# Patient Record
Sex: Male | Born: 1970 | Race: Black or African American | Hispanic: No | Marital: Married | Smoking: Former smoker
Health system: Southern US, Community
[De-identification: ages and names within clinical notes are randomized; demographics above are authoritative.]

## PROBLEM LIST (undated history)

## (undated) DIAGNOSIS — R7611 Nonspecific reaction to tuberculin skin test without active tuberculosis: Secondary | ICD-10-CM

## (undated) DIAGNOSIS — J449 Chronic obstructive pulmonary disease, unspecified: Secondary | ICD-10-CM

## (undated) DIAGNOSIS — J439 Emphysema, unspecified: Secondary | ICD-10-CM

## (undated) HISTORY — DX: Nonspecific reaction to tuberculin skin test without active tuberculosis: R76.11

## (undated) HISTORY — PX: APPENDECTOMY: SHX54

---

## 1998-03-04 DIAGNOSIS — R7611 Nonspecific reaction to tuberculin skin test without active tuberculosis: Secondary | ICD-10-CM

## 1998-03-04 HISTORY — DX: Nonspecific reaction to tuberculin skin test without active tuberculosis: R76.11

## 2004-04-16 ENCOUNTER — Emergency Department: Payer: Self-pay | Admitting: Unknown Physician Specialty

## 2011-09-20 ENCOUNTER — Emergency Department: Payer: Self-pay | Admitting: Emergency Medicine

## 2013-09-23 ENCOUNTER — Inpatient Hospital Stay: Payer: Self-pay | Admitting: Internal Medicine

## 2013-09-23 LAB — BASIC METABOLIC PANEL
Anion Gap: 6 — ABNORMAL LOW (ref 7–16)
BUN: 14 mg/dL (ref 7–18)
Calcium, Total: 9.1 mg/dL (ref 8.5–10.1)
Chloride: 98 mmol/L (ref 98–107)
Co2: 33 mmol/L — ABNORMAL HIGH (ref 21–32)
Creatinine: 0.82 mg/dL (ref 0.60–1.30)
EGFR (Non-African Amer.): >60
GLUCOSE: 103 mg/dL — AB (ref 65–99)
Osmolality: 275 (ref 275–301)
Potassium: 4.3 mmol/L (ref 3.5–5.1)
Sodium: 137 mmol/L (ref 136–145)

## 2013-09-23 LAB — TROPONIN I

## 2013-09-23 LAB — CBC
HCT: 47.6 % (ref 40.0–52.0)
HGB: 15.5 g/dL (ref 13.0–18.0)
MCH: 32.4 pg (ref 26.0–34.0)
MCHC: 32.6 g/dL (ref 32.0–36.0)
MCV: 100 fL (ref 80–100)
PLATELETS: 104 10*3/uL — AB (ref 150–440)
RBC: 4.78 10*6/uL (ref 4.40–5.90)
RDW: 13.1 % (ref 11.5–14.5)
WBC: 12.3 10*3/uL — AB (ref 3.8–10.6)

## 2013-09-24 LAB — CBC WITH DIFFERENTIAL/PLATELET
BASOS ABS: 0 10*3/uL (ref 0.0–0.1)
BASOS PCT: 0.2 %
Eosinophil #: 0 10*3/uL (ref 0.0–0.7)
Eosinophil %: 0 %
HCT: 45.6 % (ref 40.0–52.0)
HGB: 15.1 g/dL (ref 13.0–18.0)
LYMPHS ABS: 1 10*3/uL (ref 1.0–3.6)
Lymphocyte %: 13.3 %
MCH: 32.7 pg (ref 26.0–34.0)
MCHC: 33.1 g/dL (ref 32.0–36.0)
MCV: 99 fL (ref 80–100)
MONO ABS: 0.2 x10 3/mm (ref 0.2–1.0)
Monocyte %: 2.8 %
Neutrophil #: 6 10*3/uL (ref 1.4–6.5)
Neutrophil %: 83.7 %
Platelet: 87 10*3/uL — ABNORMAL LOW (ref 150–440)
RBC: 4.62 10*6/uL (ref 4.40–5.90)
RDW: 13 % (ref 11.5–14.5)
WBC: 7.2 10*3/uL (ref 3.8–10.6)

## 2013-09-24 LAB — BASIC METABOLIC PANEL
Anion Gap: 6 — ABNORMAL LOW (ref 7–16)
BUN: 16 mg/dL (ref 7–18)
CALCIUM: 8.9 mg/dL (ref 8.5–10.1)
CREATININE: 0.95 mg/dL (ref 0.60–1.30)
Chloride: 97 mmol/L — ABNORMAL LOW (ref 98–107)
Co2: 35 mmol/L — ABNORMAL HIGH (ref 21–32)
EGFR (Non-African Amer.): >60
GLUCOSE: 139 mg/dL — AB (ref 65–99)
OSMOLALITY: 279 (ref 275–301)
POTASSIUM: 4.6 mmol/L (ref 3.5–5.1)
Sodium: 138 mmol/L (ref 136–145)

## 2013-09-24 LAB — RAPID HIV SCREEN (HIV 1/2 AB+AG)

## 2013-11-12 ENCOUNTER — Emergency Department: Payer: Self-pay | Admitting: Emergency Medicine

## 2013-12-28 ENCOUNTER — Ambulatory Visit: Payer: Self-pay

## 2014-01-04 ENCOUNTER — Ambulatory Visit: Payer: Self-pay

## 2014-06-25 NOTE — Discharge Summary (Signed)
PATIENT NAME:  Chris Case, Chris Case MR#:  [id number] DATE OF BIRTH:  1970-05-27  DATE OF ADMISSION:  09/23/2013 DATE OF DISCHARGE:  09/27/2013  PRIMARY CARE PHYSICIAN: At the [facility].   FINAL DIAGNOSES: 1. Acute now chronic hypoxic respiratory failure.  2. Chronic obstructive pulmonary disease exacerbation.  3. Thrombocytopenia.  4. Tobacco abuse.   MEDICATIONS ON DISCHARGE: Include: A prednisone taper: 5 mg 5 tablets day one; 4 tablets day two, 3 tablets day three, 2 tablets day four, 1 tablet day five, then stop. Spiriva 1 inhalation daily, Advair Diskus 250/50 at 1 puff twice a day, albuterol CFC 1 puff 4 times a day as needed for shortness of breath, nicotine patch 1 patch daily, Levaquin 500 mg orally every 24 hours for 4 more days.   DIET: Regular diet, regular consistency.   ACTIVITY: As tolerated.   FOLLOWUP: In 1 to 2 weeks at the [facility].   HOSPITAL COURSE: The patient was admitted 09/23/2013, discharged 09/27/2013, was admitted with shortness of breath, hypoxic/hypercarbic respiratory failure secondary to severe COPD, new diagnosis, was started on IV steroids, IV Levaquin, started on Advair and Spiriva. Pulmonary was asked to see the patient. The patient was seen in consultation by Dr. Belia HemanKasa, pulmonary.   LABORATORY AND RADIOLOGICAL DATA DURING THE HOSPITAL COURSE: Included: An EKG that showed sinus tachycardia, occasional premature ventricular complexes. Glucose 103, BUN 14, creatinine 0.82, sodium 137, potassium 4.3, chloride 98, CO2 of 33, calcium 9.1. Troponin negative. White blood cell count 12.3, hemoglobin and hematocrit 15.5 and 47.6, platelet count of 104,000. Chest x-ray: Advanced COPD and emphysema. No acute cardiopulmonary disease. ABG showed a pH of 7.34, pCO2 of 65, pO2 of 68, FiO2 of 28%, bicarbonate 35.1, oxygen saturation 92%. CT scan of the chest showed no pulmonary embolism, marked hyperinflation consistent with COPD, alpha antitrypsin phenotype 163, which  is in the normal range. HIV negative. Hepatitis C negative. Platelet count upon discharge 8700.   HOSPITAL COURSE PER PROBLEM LIST:  1. Acute now chronic hypoxic respiratory failure. The patient was started on IV steroids, IV Levaquin. Respiratory status had improved, but we were unable to get him off oxygen and pulse oximetry 87% on room air and lungs were moving good air without any wheeze upon discharge. I think this is chronic hypoxic respiratory failure secondary to 3 packs per day of smoking. The patient will go home on a prednisone taper. He will be set up with the [facility] program for Spiriva, Advair and albuterol inhaler.  2. COPD exacerbation. It was treated with IV Solu-Medrol and nebulizer treatments in the hospital and Levaquin. He will finish up the Levaquin course upon discharge. Inhalers were labeled for discharge. He will [facility]ers.  3. Thrombocytopenia, unclear etiology. [facility] for [facility]. HIV and hepatitis C were negative.  4. Tobacco abuse. Smoking cessation counseling done during the hospital course.    TIME SPENT ON DISCHARGE: 35 minutes.    ____________________________ Herschell Dimesichard J. Renae GlossWieting, MD rjw:lm D: 09/27/2013 14:11:07 ET T: 09/28/2013 01:45:16 ET JOB#: [id number]  cc: Herschell Dimesichard J. Renae GlossWieting, MD, <Dictator> [facility] Salley ScarletICHARD J Camelia Stelzner MD ELECTRONICALLY SIGNED 09/30/2013 15:44

## 2014-06-25 NOTE — H&P (Signed)
PATIENT NAME:  Chris Case, Chris Case MR#:  [id number] DATE OF BIRTH:  April 22, 1970  DATE OF ADMISSION:  09/23/2013  PRIMARY CARE PROVIDER: None.   EMERGENCY DEPARTMENT REFERRING PHYSICIAN: Dr. Ethelda Chick.    CHIEF COMPLAINT: Shortness of breath.   HISTORY OF PRESENT ILLNESS: The patient is a 44 year old African American male with history of progressive shortness of breath for 4 years. Has not sought any medical attention. Now reports over the past week symptoms have gotten very severe, and he has been short of breath even at rest. He also has some productive cough. The patient came to the ED with these symptoms. He does smoke 3 packs per day for the last 18 years, and he underwent evaluation with CT per PE protocol, which showed marked hyperinflation consistent with COPD.  No CHF or lymphadenopathy was noted. The patient had an ABG that was suggestive of hypercarbia and hypoxia. The patient otherwise denies any fevers, chills. No chest pains, palpitations. No nausea, vomiting or diarrhea. No fevers.   PAST MEDICAL HISTORY: None.   PAST SURGICAL HISTORY: Appendectomy.   ALLERGIES: None.   MEDICATIONS: None.   SOCIAL HISTORY: Smokes 3 packs per day for 18 years. No alcohol. Uses marijuana from time to time.   FAMILY HISTORY: Mother with COPD, died at age 52. Grandfather with COPD.     REVIEW OF SYSTEMS:   CONSTITUTIONAL: Denies any fevers. Complains of fatigue, weakness. No pain. No weight loss. No weight gain.  EYES: No blurred or double vision. No pain. No redness. No inflammation. No glaucoma. No cataracts.  ENT: No tinnitus. No ear pain. No hearing loss. No seasonal or year-round allergies. No epistaxis. No difficulty swallowing.  RESPIRATORY: Complains of cough, shortness of breath, wheezing. No hemoptysis.  CARDIOVASCULAR: Denies any chest pain, orthopnea, edema or arrhythmia.  GASTROINTESTINAL: No nausea, vomiting, diarrhea. No abdominal pain. No hematemesis. No melena.  GENITOURINARY: Denies  any dysuria, hematuria, renal colic or frequency.  ENDOCRINE: Denies any polyuria, nocturia or thyroid problems.  HEMATOLOGIC AND LYMPHATIC: Denies anemia, easy bruisability or bleeding.  SKIN: No acne. No rash. No changes in mole, hair or skin.  MUSCULOSKELETAL: Denies any pain in the neck, back or shoulder.  NEUROLOGIC:  No numbness, CVA, TIA or seizure.  PSYCHIATRIC: No anxiety, insomnia or ADD.   PHYSICAL EXAMINATION: VITAL SIGNS:  Temperature 98, pulse 106, respirations 22, blood pressure 152/96, O2 of 78 with room air.  GENERAL: The patient is a thin male in mild respiratory distress with accessory muscle usage.  HEENT: Head atraumatic, normocephalic. Pupils equally round, reactive to light and accommodation. There is no conjunctival pallor. No scleral icterus. Nasal exam shows no drainage or ulceration.  Oropharynx is clear without any exudate.  NECK: Supple without any JVD.  CARDIOVASCULAR: Regular rate and rhythm.  No murmurs, rubs, clicks or gallops.    LUNGS: Diminished breath sounds bilaterally without any rales, rhonchi, wheezing. There is accessory muscle usage.  ABDOMEN: Soft, nontender, nondistended. Positive bowel sounds x 4.  EXTREMITIES: No clubbing, cyanosis or edema.  SKIN: No rash.  LYMPHATICS: No lymph nodes palpable.  VASCULAR: Good DP, PT pulses.  PSYCHIATRIC: Not anxious or depressed.  NEUROLOGIC: Awake, alert, oriented x 3. No focal deficits.  LYMPHATICS:  Lymph nodes nonpalpable.   LABORATORY DATA AND IMAGING:  CT per PE protocol; shows no evidence of PE. There is marked hyperinflation consistent with COPD.  No CHF or pneumonia. Glucose 103, BUN 14, creatinine 0.82, sodium 137, potassium 4.3, chloride 98, calcium 9.1. Troponin less  than 0.02. WBC 12.3, hemoglobin 15.5, platelet count 104. ABG:  PH 7.34, pCO2 of 65, pO2 of 68.   ASSESSMENT AND PLAN:  The patient is a 44 year old African American male with history of 3 packs per day of smoking for 20 years,  presents with shortness of breath, cough. No diagnosis of COPD.  CT is suggestive of severe COPD. ABG shows hypoxia and hypercarbia.  1. Hypoxic hypercarbic failure, likely due to severe chronic obstructive pulmonary disease, which is a new diagnosis for this patient. At this time, we will treat him with nebulizers, steroids  and IV Levaquin. I will also start him on Advair and Spiriva. We will ask pulmonary to see, especially with his age, [age], and family history.  I will check alpha-antitrypsin panel.  2. Nicotine addiction. The patient counseled regarding importance of smoking cessation. Nicotine patch will be started, and the patient strongly recommended to stop smoking. Four minutes spent on this.   TIME SPENT:  50 minutes.      ____________________________ Lacie ScottsShreyang H. Allena KatzPatel, MD shp:dmm D: 09/23/2013 19:33:10 ET T: 09/23/2013 19:47:57 ET JOB#: [id number]  cc: Carrell Palmatier H. Allena KatzPatel, MD, <Dictator> Charise CarwinSHREYANG H Antuane Eastridge MD ELECTRONICALLY SIGNED 10/02/2013 11:07

## 2015-08-23 ENCOUNTER — Ambulatory Visit
Admission: RE | Admit: 2015-08-23 | Discharge: 2015-08-23 | Disposition: A | Discharge status: Home / Self Care | Payer: Medicaid Other | Source: Ambulatory Visit | Attending: Family Medicine | Admitting: Family Medicine

## 2015-08-23 ENCOUNTER — Other Ambulatory Visit: Payer: Self-pay | Admitting: Family Medicine

## 2015-08-23 DIAGNOSIS — J441 Chronic obstructive pulmonary disease with (acute) exacerbation: Secondary | ICD-10-CM

## 2015-09-23 ENCOUNTER — Emergency Department: Payer: Medicaid Other

## 2015-09-23 ENCOUNTER — Observation Stay
Admission: EM | Admit: 2015-09-23 | Discharge: 2015-09-26 | Disposition: A | Discharge status: Home / Self Care | Payer: Medicaid Other | Attending: Internal Medicine | Admitting: Internal Medicine

## 2015-09-23 ENCOUNTER — Encounter: Payer: Self-pay | Admitting: Emergency Medicine

## 2015-09-23 DIAGNOSIS — J441 Chronic obstructive pulmonary disease with (acute) exacerbation: Secondary | ICD-10-CM

## 2015-09-23 DIAGNOSIS — R600 Localized edema: Secondary | ICD-10-CM

## 2015-09-23 DIAGNOSIS — D696 Thrombocytopenia, unspecified: Secondary | ICD-10-CM | POA: Insufficient documentation

## 2015-09-23 DIAGNOSIS — R0902 Hypoxemia: Secondary | ICD-10-CM | POA: Diagnosis present

## 2015-09-23 DIAGNOSIS — Z590 Homelessness: Secondary | ICD-10-CM | POA: Insufficient documentation

## 2015-09-23 DIAGNOSIS — R9431 Abnormal electrocardiogram [ECG] [EKG]: Secondary | ICD-10-CM

## 2015-09-23 DIAGNOSIS — Z79899 Other long term (current) drug therapy: Secondary | ICD-10-CM | POA: Diagnosis not present

## 2015-09-23 DIAGNOSIS — J209 Acute bronchitis, unspecified: Secondary | ICD-10-CM

## 2015-09-23 DIAGNOSIS — J9621 Acute and chronic respiratory failure with hypoxia: Secondary | ICD-10-CM | POA: Diagnosis not present

## 2015-09-23 DIAGNOSIS — J9622 Acute and chronic respiratory failure with hypercapnia: Secondary | ICD-10-CM | POA: Diagnosis not present

## 2015-09-23 DIAGNOSIS — F17200 Nicotine dependence, unspecified, uncomplicated: Secondary | ICD-10-CM | POA: Insufficient documentation

## 2015-09-23 DIAGNOSIS — J440 Chronic obstructive pulmonary disease with acute lower respiratory infection: Secondary | ICD-10-CM | POA: Diagnosis not present

## 2015-09-23 HISTORY — DX: Emphysema, unspecified: J43.9

## 2015-09-23 HISTORY — DX: Chronic obstructive pulmonary disease, unspecified: J44.9

## 2015-09-23 LAB — BASIC METABOLIC PANEL
ANION GAP: 5 (ref 5–15)
BUN: 16 mg/dL (ref 6–20)
CALCIUM: 8.6 mg/dL — AB (ref 8.9–10.3)
CO2: 38 mmol/L — AB (ref 22–32)
CREATININE: 0.87 mg/dL (ref 0.61–1.24)
Chloride: 98 mmol/L — ABNORMAL LOW (ref 101–111)
GLUCOSE: 87 mg/dL (ref 65–99)
Potassium: 4 mmol/L (ref 3.5–5.1)
Sodium: 141 mmol/L (ref 135–145)

## 2015-09-23 LAB — TROPONIN I

## 2015-09-23 LAB — CBC
HCT: 45.9 % (ref 40.0–52.0)
HEMOGLOBIN: 15 g/dL (ref 13.0–18.0)
MCH: 32.3 pg (ref 26.0–34.0)
MCHC: 32.7 g/dL (ref 32.0–36.0)
MCV: 98.8 fL (ref 80.0–100.0)
PLATELETS: 95 10*3/uL — AB (ref 150–440)
RBC: 4.65 MIL/uL (ref 4.40–5.90)
RDW: 14.7 % — ABNORMAL HIGH (ref 11.5–14.5)
WBC: 8.4 10*3/uL (ref 3.8–10.6)

## 2015-09-23 LAB — BRAIN NATRIURETIC PEPTIDE: B NATRIURETIC PEPTIDE 5: 191 pg/mL — AB (ref 0.0–100.0)

## 2015-09-23 MED ORDER — PREDNISONE 20 MG PO TABS
60.0000 mg | ORAL_TABLET | Freq: Once | ORAL | Status: AC
  Administered 2015-09-24: 60 mg via ORAL
  Filled 2015-09-23: qty 3

## 2015-09-23 MED ORDER — IPRATROPIUM-ALBUTEROL 0.5-2.5 (3) MG/3ML IN SOLN
3.0000 mL | Freq: Once | RESPIRATORY_TRACT | Status: AC
  Administered 2015-09-24: 3 mL via RESPIRATORY_TRACT
  Filled 2015-09-23: qty 3

## 2015-09-23 NOTE — ED Notes (Signed)
Pt states that he is supposed to be on O2 at [facility], but that he currently lives in his truck and that it's too hot to have them in the truck.  Pt reports that he does not have a history of CHF, but that he believes that his grandma had it because her feet also used to swell.  Pt has no family in the area to stay with so that he can have his O2 at this time.  Pt was 83-85% on room air, pt placed on 2LNC and O2 sat improved.  Pt also dyspneic with exertion from chair to bed.

## 2015-09-23 NOTE — ED Notes (Addendum)
Patient states that he started developing swelling to bilateral feet about 2 days ago.  Patient denies any history of chf. Patient with history of copd and denies any increase in shortness of breath. Patient reports that he is suppose to wear oxygen at 2l as needed.

## 2015-09-24 ENCOUNTER — Encounter: Payer: Self-pay | Admitting: Internal Medicine

## 2015-09-24 ENCOUNTER — Observation Stay (HOSPITAL_BASED_OUTPATIENT_CLINIC_OR_DEPARTMENT_OTHER)
Admit: 2015-09-24 | Discharge: 2015-09-24 | Disposition: A | Discharge status: Home / Self Care | Payer: Medicaid Other | Attending: Internal Medicine | Admitting: Internal Medicine

## 2015-09-24 DIAGNOSIS — R9431 Abnormal electrocardiogram [ECG] [EKG]: Secondary | ICD-10-CM

## 2015-09-24 DIAGNOSIS — R600 Localized edema: Secondary | ICD-10-CM

## 2015-09-24 DIAGNOSIS — J9621 Acute and chronic respiratory failure with hypoxia: Secondary | ICD-10-CM | POA: Insufficient documentation

## 2015-09-24 DIAGNOSIS — J441 Chronic obstructive pulmonary disease with (acute) exacerbation: Secondary | ICD-10-CM

## 2015-09-24 DIAGNOSIS — J9622 Acute and chronic respiratory failure with hypercapnia: Secondary | ICD-10-CM

## 2015-09-24 MED ORDER — ACETAMINOPHEN 650 MG RE SUPP: 650.0000 mg | Freq: Four times a day (QID) | RECTAL | Status: DC | PRN

## 2015-09-24 MED ORDER — BISACODYL 10 MG RE SUPP: 10.0000 mg | Freq: Every day | RECTAL | Status: DC | PRN

## 2015-09-24 MED ORDER — ONDANSETRON HCL 4 MG PO TABS: 4.0000 mg | ORAL_TABLET | Freq: Four times a day (QID) | ORAL | Status: DC | PRN

## 2015-09-24 MED ORDER — SODIUM CHLORIDE 0.9% FLUSH: 3.0000 mL | Freq: Two times a day (BID) | INTRAVENOUS | Status: DC

## 2015-09-24 MED ORDER — ONDANSETRON HCL 4 MG/2ML IJ SOLN
4.0000 mg | Freq: Four times a day (QID) | INTRAMUSCULAR | Status: DC | PRN
Start: 2015-09-24 — End: 2015-09-26

## 2015-09-24 MED ORDER — MOMETASONE FURO-FORMOTEROL FUM 200-5 MCG/ACT IN AERO
2.0000 | INHALATION_SPRAY | Freq: Two times a day (BID) | RESPIRATORY_TRACT | Status: DC
  Administered 2015-09-24 – 2015-09-26 (×5): 2 via RESPIRATORY_TRACT
  Filled 2015-09-24: qty 8.8

## 2015-09-24 MED ORDER — TIOTROPIUM BROMIDE MONOHYDRATE 18 MCG IN CAPS
18.0000 ug | ORAL_CAPSULE | Freq: Every day | RESPIRATORY_TRACT | Status: DC
  Administered 2015-09-24 – 2015-09-26 (×3): 18 ug via RESPIRATORY_TRACT
  Filled 2015-09-24: qty 5

## 2015-09-24 MED ORDER — SODIUM CHLORIDE 0.9% FLUSH
3.0000 mL | Freq: Two times a day (BID) | INTRAVENOUS | Status: DC
  Administered 2015-09-24: 3 mL via INTRAVENOUS

## 2015-09-24 MED ORDER — LEVOFLOXACIN 500 MG PO TABS: 500.0000 mg | ORAL_TABLET | Freq: Every day | ORAL | Status: DC

## 2015-09-24 MED ORDER — SODIUM CHLORIDE 0.9% FLUSH: 3.0000 mL | INTRAVENOUS | Status: DC | PRN

## 2015-09-24 MED ORDER — IPRATROPIUM-ALBUTEROL 0.5-2.5 (3) MG/3ML IN SOLN
3.0000 mL | Freq: Four times a day (QID) | RESPIRATORY_TRACT | Status: DC
  Administered 2015-09-24 – 2015-09-25 (×7): 3 mL via RESPIRATORY_TRACT
  Filled 2015-09-24 (×7): qty 3

## 2015-09-24 MED ORDER — LEVOFLOXACIN 500 MG PO TABS
500.0000 mg | ORAL_TABLET | Freq: Every day | ORAL | Status: DC
  Administered 2015-09-24 – 2015-09-25 (×2): 500 mg via ORAL
  Filled 2015-09-24 (×2): qty 1

## 2015-09-24 MED ORDER — HEPARIN SODIUM (PORCINE) 5000 UNIT/ML IJ SOLN
5000.0000 [IU] | Freq: Three times a day (TID) | INTRAMUSCULAR | Status: DC
  Administered 2015-09-24 – 2015-09-26 (×6): 5000 [IU] via SUBCUTANEOUS
  Filled 2015-09-24 (×6): qty 1

## 2015-09-24 MED ORDER — PANTOPRAZOLE SODIUM 40 MG PO TBEC
40.0000 mg | DELAYED_RELEASE_TABLET | Freq: Every day | ORAL | Status: DC
  Administered 2015-09-24 – 2015-09-26 (×3): 40 mg via ORAL
  Filled 2015-09-24 (×3): qty 1

## 2015-09-24 MED ORDER — HYDROCODONE-ACETAMINOPHEN 5-325 MG PO TABS: 1.0000 | ORAL_TABLET | ORAL | Status: DC | PRN

## 2015-09-24 MED ORDER — ALBUTEROL SULFATE (2.5 MG/3ML) 0.083% IN NEBU: 3.0000 mL | INHALATION_SOLUTION | RESPIRATORY_TRACT | Status: DC | PRN

## 2015-09-24 MED ORDER — ACETAMINOPHEN 325 MG PO TABS
650.0000 mg | ORAL_TABLET | Freq: Four times a day (QID) | ORAL | Status: DC | PRN
  Administered 2015-09-25: 650 mg via ORAL
  Filled 2015-09-24: qty 2

## 2015-09-24 MED ORDER — DOCUSATE SODIUM 100 MG PO CAPS
100.0000 mg | ORAL_CAPSULE | Freq: Two times a day (BID) | ORAL | Status: DC
  Administered 2015-09-24 – 2015-09-25 (×2): 100 mg via ORAL
  Filled 2015-09-24 (×3): qty 1

## 2015-09-24 MED ORDER — SODIUM CHLORIDE 0.9 % IV SOLN: 250.0000 mL | INTRAVENOUS | Status: DC | PRN

## 2015-09-24 MED ORDER — ASPIRIN EC 81 MG PO TBEC
81.0000 mg | DELAYED_RELEASE_TABLET | Freq: Every day | ORAL | Status: DC
Start: 2015-09-24 — End: 2015-09-26
  Administered 2015-09-24 – 2015-09-26 (×3): 81 mg via ORAL
  Filled 2015-09-24 (×3): qty 1

## 2015-09-24 NOTE — ED Provider Notes (Signed)
[facility] Emergency Department Provider Note  Time seen: 12:01 AM  I have reviewed the triage vital signs and the nursing notes.   HISTORY  Chief Complaint Foot Swelling    HPI Chris Case is a 45 y.o. male with a past medical history of COPD who presents to the emergency department with shortness of breath and swelling in his feet. According to the patient he is prescribed oxygen, but is currently homeless so has nowhere to put the oxygen or keep the oxygen tanks.Patient presents the emergency department wishing to be evaluated for bilateral foot swelling and increased shortness of breath. On arrival the patient is an 83% room air oxygen saturation. Denies any cough or fever. Denies any recent chest pain. Denies abdominal pain. Describes his shortness of breath is moderate, but states this is how it has felt. Patient saw his primary care doctor is [place] [facility] 1 week ago, and was restarted on his Advair and albuterol. He was told he needs oxygen but he needs to have somewhere to keep it, before they would prescribe it, and told him if his shortness of breath worsens to go to the emergency department.     Past Medical History  Diagnosis Date  . COPD (chronic obstructive pulmonary disease) (HCC)   . Emphysema lung (HCC)     There are no active problems to display for this patient.   Past Surgical History  Procedure Laterality Date  . Appendectomy      No current outpatient prescriptions on file.  Allergies Review of patient's allergies indicates no known allergies.  No family history on file.  Social History Social History  Substance Use Topics  . Smoking status: Current Every Day Smoker  . Smokeless tobacco: None  . Alcohol Use: None    Review of Systems Constitutional: Negative for fever. Cardiovascular: Negative for chest pain. Respiratory: Positive for shortness of breath, chronic Gastrointestinal: Negative for abdominal  pain Musculoskeletal: Negative for back pain. Neurological: Negative for headache 10-point ROS otherwise negative.  ____________________________________________   PHYSICAL EXAM:  VITAL SIGNS: ED Triage Vitals  Enc Vitals Group     BP 09/23/15 1955 114/77 mmHg     Pulse Rate 09/23/15 1955 87     Resp 09/23/15 1955 18     Temp 09/23/15 1955 98.4 F (36.9 C)     Temp src --      SpO2 09/23/15 1955 91 %     Weight 09/23/15 1955 130 lb (58.968 kg)     Height 09/23/15 1955 5\' 6"  (1.676 m)     Head Cir --      Peak Flow --      Pain Score --      Pain Loc --      Pain Edu? --      Excl. in GC? --     Constitutional: Alert and oriented. Well appearing and in no distress. Eyes: Normal exam ENT   Head: Normocephalic and atraumatic   Mouth/Throat: Mucous membranes are moist. Cardiovascular: Normal rate, regular rhythm. No murmur Respiratory: Normal respiratory effort without tachypnea nor retractions. Breath sounds are diminished bilaterally, but no active wheezes appreciated. Gastrointestinal: Soft and nontender. No distention.  Musculoskeletal: Nontender with normal range of motion in all extremities. 1+ edema to lower extremities bilaterally, no calf tenderness. Neurologic:  Normal speech and language. No gross focal neurologic deficits  Skin:  Skin is warm, dry and intact.  Psychiatric: Mood and affect are normal.  ____________________________________________  EKG  EKG reviewed and interpreted by myself shows normal sinus rhythm at 86 bpm, narrow QRS, normal axis, normal intervals, nonspecific ST changes. No ST elevations.  ____________________________________________    RADIOLOGY  Chest x-ray negative  ____________________________________________    INITIAL IMPRESSION / ASSESSMENT AND PLAN / ED COURSE  Pertinent labs & imaging results that were available during my care of the patient were reviewed by me and considered in my medical decision making (see  chart for details).  The patient presents the emergency department hypoxic, with decreased breath sounds bilaterally. Patient is supposed to be on home oxygen, but does not have a home currently so cannot obtain home oxygen. As the patient is 83% room air oxygen saturation we cannot discharge the patient home at this time. We will dose prednisone, DuoNebs, and closely monitor in the emergency department. Patient will likely require social work consultation to decide upon appropriate disposition versus hospitalist admission for hypoxia. Patient will require an echocardiogram as an outpatient. Labs including troponin are largely within normal limits size a mild BNP elevation.   Patient is medically clear at this time. He will need to follow up with cardiology in the next 1-2 weeks for an echocardiogram. I discussed this with the patient who is agreeable to this plan. Currently awaiting social work consultation, in hopes of helping the patient obtain home oxygen without requiring hospital admission.  ____________________________________________   FINAL CLINICAL IMPRESSION(S) / ED DIAGNOSES  Hypoxia COPD Peripheral edema   Minna Antis, MD 09/24/15 [phone]

## 2015-09-24 NOTE — ED Notes (Signed)
Report given to next shift RN Jordan L. 

## 2015-09-24 NOTE — H&P (Signed)
History and Physical    Hebert Dooling WJX:[id number] DOB: 12/09/1970 DOA: 09/23/2015  Referring physician: Dr. Alphonzo Lemmings PCP: [facility]  Specialists: none  Chief Complaint:  SOB and swelling  HPI: Chris Case is a 45 y.o. male has a past medical history significant for COPD now with worsening SOB and LE edema. No fever. Denies CP or cardiac hx. Pt is homeless. Was on O2 in the past. In ER, pt hypoxic but CXR negative. EKG is abnormal. He is now admitted.  Review of Systems: The patient denies anorexia, fever, weight loss,, vision loss, decreased hearing, hoarseness, chest pain, syncope,  balance deficits, hemoptysis, abdominal pain, melena, hematochezia, severe indigestion/heartburn, hematuria, incontinence, genital sores, muscle weakness, suspicious skin lesions, transient blindness, difficulty walking, depression, unusual weight change, abnormal bleeding, enlarged lymph nodes, angioedema, and breast masses.   Past Medical History:  Diagnosis Date  . COPD (chronic obstructive pulmonary disease) (HCC)   . Emphysema lung (HCC)    Past Surgical History:  Procedure Laterality Date  . APPENDECTOMY     Social History:  reports that he has been smoking.  He has never used smokeless tobacco. His alcohol and drug histories are not on file.  No Known Allergies  History reviewed. No pertinent family history.  Prior to Admission medications   Medication Sig Start Date End Date Taking? Authorizing Provider  albuterol (PROVENTIL HFA;VENTOLIN HFA) 108 (90 Base) MCG/ACT inhaler Inhale 1 puff into the lungs every 6 (six) hours as needed for wheezing or shortness of breath.   Yes Historical Provider, MD  OXYGEN Inhale 2 L/min into the lungs continuous as needed. For shortness of breath.   Yes Historical Provider, MD  tiotropium (SPIRIVA) 18 MCG inhalation capsule Place 18 mcg into inhaler and inhale daily.   Yes Historical Provider, MD   Physical Exam: Vitals:   09/24/15 0830  09/24/15 0900 09/24/15 1030 09/24/15 1155  BP: 98/68 105/64 102/66 109/78  Pulse: 62 79 71   Resp: Temp:      SpO2: 95% 97% 94%   Weight:      Height:         General:  No apparent distress, WDWN, [place]/AT  Eyes: PERRL, EOMI, no scleral icterus, conjunctiva clear  ENT: moist oropharynx without exudate, TM's benign, Dentition fair  Neck: supple, no lymphadenopathy  Cardiovascular: regular rate without MRG; 2+ peripheral pulses, no JVD, no peripheral edema  Respiratory: decreased breath sounds with scattered wheezes and rhonchi. No dullness or rales. Respiratory effort increased  Abdomen: soft, non tender to palpation, positive bowel sounds, no guarding, no rebound  Skin: no rashes or lesions  Musculoskeletal: normal bulk and tone, no joint swelling  Psychiatric: normal mood and affect, A&OX3  Neurologic: CN 2-12 grossly intact, Motor strength 5/5 in all 4 groups with symmetric DTR's and non-focal sensory exam.  Labs on Admission:  Basic Metabolic Panel:  Recent Labs Lab 09/23/15 2002  NA 141  K 4.0  CL 98*  CO2 38*  GLUCOSE 87  BUN 16  CREATININE 0.87  CALCIUM 8.6*   Liver Function Tests: No results for input(s): AST, ALT, ALKPHOS, BILITOT, PROT, ALBUMIN in the last 168 hours. No results for input(s): LIPASE, AMYLASE in the last 168 hours. No results for input(s): AMMONIA in the last 168 hours. CBC:  Recent Labs Lab 09/23/15 2002  WBC 8.4  HGB 15.0  HCT 45.9  MCV 98.8  PLT 95*   Cardiac Enzymes:  Recent Labs Lab 09/23/15  2002  TROPONINI <0.03    BNP (last 3 results)  Recent Labs  09/23/15 2002  BNP 191.0*    ProBNP (last 3 results) No results for input(s): PROBNP in the last 8760 hours.  CBG: No results for input(s): GLUCAP in the last 168 hours.  Radiological Exams on Admission: Dg Chest 2 View  Result Date: 09/23/2015 CLINICAL DATA:  Patient with intermittent shortness of breath. Productive cough. EXAM: CHEST  2 VIEW  COMPARISON:  Chest radiograph 08/23/2015. FINDINGS: Normal cardiac and mediastinal contours. No consolidative pulmonary opacities. No pleural effusion or pneumothorax. Thoracic spine degenerative changes. IMPRESSION: No active cardiopulmonary disease. Electronically Signed   By: Annia Belt M.D.   On: 09/23/2015 20:21    EKG: Independently reviewed.  Assessment/Plan Principal Problem:   COPD exacerbation (HCC) Active Problems:   Hypoxia   Abnormal ECG   Edema, lower extremity   Will admit to floor and begin optimal pulmonary toilet. CM consult for home health and home O2. Echo due to abnormal ECG. Repeat labs in AM.  Diet: low salt Fluids: saline lock DVT Prophylaxis: SQ Heparin  Code Status: FULL  Family Communication: none  Disposition Plan: home  Time spent: 45 min

## 2015-09-24 NOTE — Clinical Social Work Note (Signed)
Clinical Social Work Assessment  Patient Details  Name: Chris Case MRN: [id number] Date of Birth: 04-12-1970  Date of referral:  09/24/15               Reason for consult:  Discharge Planning, [employer], Housing Concerns/Homelessness                Permission sought to share information with:  Family Supports Permission granted to share information::  Yes, Verbal Permission Granted  Name::     Chad Cordial- [phone] Sister in [place]::  O2 supplier  Relationship::  yes  Contact Information:  yes  Housing/Transportation Living arrangements for the past 2 months:  Homeless Source of Information:  Patient Patient Interpreter Needed:  None Criminal Activity/Legal Involvement Pertinent to Current Situation/Hospitalization:  No - Comment as needed Significant Relationships:  Siblings Lives with:  Self Do you feel safe going back to the place where you live?  Yes Need for family participation in patient care:  Yes (Comment)  Care giving concerns: Patient has no home and the required O2 he needs will not deliver unless patient has a home address. This patient has lived in his truck for 3 months.   Social Worker assessment / plan: LCSW met with this patient and obtained verbal consent to speak to his family. He reports he was staying at friends place and has not been in a house or apartment for 3 months but living in his truck. He has medicare/medicaid and received 490.00 month. He came to ED because he was having challenges with breathing and swollen feet. He has no criminal past and psychiatric issues. [facility] [place] He is supposed to have 02 litres 34foxygen 24-7.  He was married now divorced and has a 236year old child. ( not a lot of contact) Patient has given me verbal consent to call [facility] and [place] speaking with new director (Hydrologist patient cant be supported due to O2 issues . Patient is  oriented x4 and very pleasant and cooperative. LCSW was informed patient is going to be admitted. Will forward situation to next LCSW on the floor via Handoff.  Employment status:  Disabled (Comment on whether or not currently receiving Disability) (490.00 month) Insurance information:  [place], [place]  Not assessed at this time Information / Referral to community resources:  Shelter, Other (Comment Required) (Other community resources required)  Patient/Family's Response to care:  Please make sure they give me medicine for my feet  Patient/Family's Understanding of and Emotional Response to Diagnosis, Current Treatment, and Prognosis:  Good understanding  Emotional Assessment Appearance:  Appears stated age Attitude/Demeanor/Rapport:   (Polite/kind and clear) Affect (typically observed):  Accepting, Adaptable, Appropriate Orientation:  Oriented to Self, Oriented to Place, Oriented to  Time, Oriented to Situation Alcohol / Substance use:  Never Used Psych involvement (Current and /or in the community):  No (Comment)  Discharge Needs  Concerns to be addressed:  Homelessness, Care Coordination Readmission within the last 30 days:  No Current discharge risk:  Homeless Barriers to Discharge:  Continued Medical Work up, Unsafe home situation   BJoana Reamer LCSW 09/24/2015, 10:10 AM

## 2015-09-24 NOTE — Care Management Obs Status (Signed)
MEDICARE OBSERVATION STATUS NOTIFICATION   Patient Details  Name: Chris Case MRN: [id number] Date of Birth: 1970-04-18   Medicare Observation Status Notification Given:  Yes (short of breath)    Caren Macadam, RN 09/24/2015, 12:20 PM

## 2015-09-24 NOTE — ED Notes (Signed)
See paper chart for down time notes.  

## 2015-09-24 NOTE — ED Provider Notes (Signed)
-----------------------------------------   8:13 AM on 09/24/2015 -----------------------------------------  Signed out to me at 7 AM, patient here with essentially homelessness. He has oxygen requirement but he cannot get oxygen delivered to the truck where he lives. He is otherwise medically cleared pending social work and care management assessment.   Jeanmarie Plant, MD 09/24/15 [phone]

## 2015-09-24 NOTE — ED Notes (Signed)
Pt resting in room.  No distress noted.  Attempted to take patient off of O2, but sats dropped to 88%.  Replaced on 2LNC.

## 2015-09-25 LAB — COMPREHENSIVE METABOLIC PANEL
ALBUMIN: 3.5 g/dL (ref 3.5–5.0)
ALK PHOS: 30 U/L — AB (ref 38–126)
ALT: 13 U/L — AB (ref 17–63)
ANION GAP: 2 — AB (ref 5–15)
AST: 23 U/L (ref 15–41)
BUN: 15 mg/dL (ref 6–20)
CALCIUM: 8.4 mg/dL — AB (ref 8.9–10.3)
CHLORIDE: 101 mmol/L (ref 101–111)
CO2: 38 mmol/L — AB (ref 22–32)
CREATININE: 0.83 mg/dL (ref 0.61–1.24)
GFR calc non Af Amer: >60 mL/min (ref >60)
GLUCOSE: 104 mg/dL — AB (ref 65–99)
Potassium: 4.3 mmol/L (ref 3.5–5.1)
SODIUM: 141 mmol/L (ref 135–145)
Total Bilirubin: 0.8 mg/dL (ref 0.3–1.2)
Total Protein: 5.9 g/dL — ABNORMAL LOW (ref 6.5–8.1)

## 2015-09-25 LAB — CBC
HCT: 40.9 % (ref 40.0–52.0)
HEMOGLOBIN: 13.5 g/dL (ref 13.0–18.0)
MCH: 32.3 pg (ref 26.0–34.0)
MCHC: 33.1 g/dL (ref 32.0–36.0)
MCV: 97.8 fL (ref 80.0–100.0)
Platelets: 88 10*3/uL — ABNORMAL LOW (ref 150–440)
RBC: 4.19 MIL/uL — AB (ref 4.40–5.90)
RDW: 14 % (ref 11.5–14.5)
WBC: 9.9 10*3/uL (ref 3.8–10.6)

## 2015-09-25 LAB — ECHOCARDIOGRAM COMPLETE
Height: 66 in
WEIGHTICAEL: 2080 [oz_av]

## 2015-09-25 MED ORDER — PREDNISONE 20 MG PO TABS
50.0000 mg | ORAL_TABLET | Freq: Every day | ORAL | Status: DC
  Administered 2015-09-25 – 2015-09-26 (×2): 50 mg via ORAL
  Filled 2015-09-25 (×2): qty 2

## 2015-09-25 NOTE — Progress Notes (Signed)
SATURATION QUALIFICATIONS: (This note is used to comply with regulatory documentation for home oxygen)  Patient Saturations on Room Air at Rest = 93%  Patient Saturations on Room Air while Ambulating = 90%   

## 2015-09-25 NOTE — Care Management Note (Signed)
Case Management Note  Patient Details  Name: Chris Case MRN: [id number] Date of Birth: Jul 30, 1970  Subjective/Objective:                 Patient is homeless, lives in truck (see CSW notes). Patient does not qualify for home oxygen, lowest sat on RA 90%. Patient is covered with medicare and medicaid. Patient states he is followed at [facility]. He denies difficulties getting his medications.    Action/Plan:  Will DC today.   Expected Discharge Date:                  Expected Discharge Plan:  Home/Self Care  In-House Referral:  Clinical Social Work  Discharge planning Services  CM Consult  Post Acute Care Choice:  NA Choice offered to:  NA  DME Arranged:  N/A DME Agency:  NA  HH Arranged:  NA HH Agency:  NA  Status of Service:  Completed, signed off  If discussed at Long Length of Stay Meetings, dates discussed:    Additional Comments:  Lawerance Sabal, RN 09/25/2015, 1:33 PM

## 2015-09-25 NOTE — Clinical Social Work Note (Signed)
CSW was reconsulted for "homelessness." Assessment was completed. CSW discussed pt with Charge RN and RNCM during progression rounds. CSW will assessment discharge needs, possible shelter referral once Home O2 needs are determined. CSW will continue to follow.   Dede Query, MSW, LCSW  Clinical Social Worker  [phone]

## 2015-09-25 NOTE — Care Management Obs Status (Signed)
MEDICARE OBSERVATION STATUS NOTIFICATION   Patient Details  Name: Chris Case MRN: [id number] Date of Birth: 20-Nov-1970   Medicare Observation Status Notification Given:  Yes    Lawerance Sabal, RN 09/25/2015, 1:35 PM

## 2015-09-25 NOTE — Progress Notes (Signed)
[facility] [place] at [facility]   PATIENT NAME: Chris Case    MR#:  [id number]  DATE OF BIRTH:  [birth year]  SUBJECTIVE:  CHIEF COMPLAINT:   Chief Complaint  Patient presents with  . Foot Swelling  Patient is 45 year old African male with past history significant for history of chronic respiratory failure due to COPD, who was supposed to be on oxygen therapy, but due to living sedation is not getting oxygen therapy for the past 6 months, presents to the hospital with complaints of left-sided swelling and worsening shortness of breath, wheezing, some cough with clear. Few phlegm production. In emergency room, patient was noted to be hypoxic. Chest x-ray was unremarkable, no pneumonia. Patient feels better today since he was started on DuoNeb, Dulera, levofloxacin    Review of Systems  Constitutional: Negative for chills, fever and weight loss.  HENT: Negative for congestion.   Eyes: Negative for blurred vision and double vision.  Respiratory: Positive for cough, sputum production, shortness of breath and wheezing.   Cardiovascular: Negative for chest pain, palpitations, orthopnea, leg swelling and PND.  Gastrointestinal: Negative for abdominal pain, blood in stool, constipation, diarrhea, nausea and vomiting.  Genitourinary: Negative for dysuria, frequency, hematuria and urgency.  Musculoskeletal: Negative for falls.  Neurological: Negative for dizziness, tremors, focal weakness and headaches.  Endo/Heme/Allergies: Does not bruise/bleed easily.  Psychiatric/Behavioral: Negative for depression. The patient does not have insomnia.     VITAL SIGNS: Blood pressure 111/74, pulse 84, temperature 98.4 F (36.9 C), temperature source Oral, resp. rate 16, height 5\' 6"  (1.676 m), weight 62.6 kg (138 lb), SpO2 90 %.  PHYSICAL EXAMINATION:   GENERAL:  45 y.o.-year-old patient lying in the bedin mild respiratory  distress, Especially whenever he moves in the bed,  talks for longer period of time, has some pursed lip breathing.  EYES: Pupils equal, round, reactive to light and accommodation. No scleral icterus. Extraocular muscles intact.  HEENT: Head atraumatic, normocephalic. Oropharynx and nasopharynx clear.  NECK:  Supple, no jugular venous distention. No thyroid enlargement, no tenderness.  LUNGS:Markedly diminished breath sounds bilaterally, no wheezing, rales,rhonchi or crepitation.Intermittent use of  accessory muscles of respiration, With exertion and speech.  CARDIOVASCULAR: S1, S2tachycardic rhythm was regular. No murmurs, rubs, or gallops.  ABDOMEN:Moderately firm due to breathing difficulties, nontender, nondistended. Bowel sounds present. No organomegaly or mass.  EXTREMITIES: No pedal edema, cyanosis, or clubbing.  NEUROLOGIC: Cranial nerves II through XII are intact. Muscle strength 5/5 in all extremities. Sensation intact. Gait not checked.  PSYCHIATRIC: The patient is alert and oriented x 3.  SKIN: No obvious rash, lesion, or ulcer.   ORDERS/RESULTS REVIEWED:   CBC  Recent Labs Lab 09/23/15 2002 09/25/15 0516  WBC 8.4 9.9  HGB 15.0 13.5  HCT 45.9 40.9  PLT 95* 88*  MCV 98.8 97.8  MCH 32.3 32.3  MCHC 32.7 33.1  RDW 14.7* 14.0   ------------------------------------------------------------------------------------------------------------------  Chemistries   Recent Labs Lab 09/23/15 2002 09/25/15 0516  NA 141 141  K 4.0 4.3  CL 98* 101  CO2 38* 38*  GLUCOSE 87 104*  BUN 16 15  CREATININE 0.87 0.83  CALCIUM 8.6* 8.4*  AST  --  23  ALT  --  13*  ALKPHOS  --  30*  BILITOT  --  0.8   ------------------------------------------------------------------------------------------------------------------ estimated creatinine clearance is 99.5 mL/min (by C-G formula based on SCr of 0.83 mg/dL). ------------------------------------------------------------------------------------------------------------------ No results for  input(s): TSH, T4TOTAL, T3FREE, THYROIDAB in the last 72  hours.  Invalid input(s): FREET3  Cardiac Enzymes  Recent Labs Lab 09/23/15 2002  TROPONINI <0.03   ------------------------------------------------------------------------------------------------------------------ Invalid input(s): POCBNP ---------------------------------------------------------------------------------------------------------------  RADIOLOGY: Dg Chest 2 View  Result Date: 09/23/2015 CLINICAL DATA:  Patient with intermittent shortness of breath. Productive cough. EXAM: CHEST  2 VIEW COMPARISON:  Chest radiograph 08/23/2015. FINDINGS: Normal cardiac and mediastinal contours. No consolidative pulmonary opacities. No pleural effusion or pneumothorax. Thoracic spine degenerative changes. IMPRESSION: No active cardiopulmonary disease. Electronically Signed   By: Annia Belt M.D.   On: 09/23/2015 20:21    EKG:  Orders placed or performed during the hospital encounter of 09/23/15  . EKG 12-Lead  . EKG 12-Lead  . ED EKG within 10 minutes  . ED EKG within 10 minutes    ASSESSMENT AND PLAN:  Principal Problem:   COPD exacerbation (HCC) Active Problems:   Hypoxia   Abnormal ECG   Edema, lower extremity #1. Acute on chronic respiratory failure with hypoxia and likely hypercapnia due to COPD exacerbation, improved with current therapy, DuoNeb, Dulera, levofloxacin, oxygen , initiate patient on steroids orally, follow clinically. Clinical walkers and social work was involved for setting up oxygen at home and helping with homeless situation #2. COPD exacerbation due to acute bronchitis, continue levofloxacin, get sputum culture if possible #3 lower extremity edema, suspect right-sided heart failure, echocardiogram was performed, normal ejection fraction, normal howe valvular function, ver, pulmonary artery pressures could not have been accurately estimated, supportive therapy   #4. Thrombocytopenia, gets HIT, follow  later it count in the morning    Management plans discussed with the patient, family and they are in agreement.   DRUG ALLERGIES: No Known Allergies  CODE STATUS:     Code Status Orders        Start     Ordered   09/24/15 1354  Full code  Continuous     09/24/15 1353    Code Status History    Date Active Date Inactive Code Status Order ID Comments User Context   09/24/2015  1:53 PM 09/24/2015  2:30 PM Full Code [id number]  Marguarite Arbour, MD Inpatient      TOTAL TIME TAKING CARE OF THIS PATIENT: 45 minutes.    Katharina Caper M.D on 09/25/2015 at 11:24 AM  Between 7am to 6pm - Pager - [phone]  After 6pm go to [email or web address] - password [email or web address] [facility]  [place] [facility]  Office  [phone]  CC: Primary care physician; [facility]

## 2015-09-25 NOTE — Progress Notes (Signed)
Patient had 8 beat run of ventricular trigeminy now converted back to SR spoke to dr pyreddy who states this is sometimes normal with COPD patients and to continue to monitor

## 2015-09-25 NOTE — Clinical Social Work Note (Signed)
CSW was notified by RN that pt does not qualify for Home O2, therefore the O2 will not be a barrier for placement at the shelter. CSW met with pt to address discharge plan. CSW provided information for [facility] for Emergency Shelter. CSW explained the process of calling for an assessment prior to go to the shelter. Pt stated that he has not been the shelter before. When asked what is his next plan if he is unable to go to the shelter, pt shared that he would go back to his truck. Pt reported that he does not have any family locally. CSW will follow up with pt regarding shelter process.  Chris Case, MSW, LCSW  Clinical Social Worker  [phone]

## 2015-09-26 DIAGNOSIS — J209 Acute bronchitis, unspecified: Secondary | ICD-10-CM

## 2015-09-26 LAB — CBC
HEMATOCRIT: 42.7 % (ref 40.0–52.0)
HEMOGLOBIN: 14.2 g/dL (ref 13.0–18.0)
MCH: 32.5 pg (ref 26.0–34.0)
MCHC: 33.2 g/dL (ref 32.0–36.0)
MCV: 97.9 fL (ref 80.0–100.0)
PLATELETS: 94 10*3/uL — AB (ref 150–440)
RBC: 4.36 MIL/uL — AB (ref 4.40–5.90)
RDW: 14.2 % (ref 11.5–14.5)
WBC: 7.5 10*3/uL (ref 3.8–10.6)

## 2015-09-26 MED ORDER — IPRATROPIUM-ALBUTEROL 0.5-2.5 (3) MG/3ML IN SOLN: 3.0000 mL | Freq: Four times a day (QID) | RESPIRATORY_TRACT | Status: DC | PRN

## 2015-09-26 MED ORDER — PREDNISONE 10 MG (21) PO TBPK: 10.0000 mg | ORAL_TABLET | Freq: Every day | ORAL | 0 refills | Status: DC

## 2015-09-26 MED ORDER — ASPIRIN 81 MG PO TBEC: 81.0000 mg | DELAYED_RELEASE_TABLET | Freq: Every day | ORAL | 6 refills | Status: DC

## 2015-09-26 MED ORDER — LEVOFLOXACIN 500 MG PO TABS: 500.0000 mg | ORAL_TABLET | Freq: Every day | ORAL | 0 refills | Status: DC

## 2015-09-26 MED ORDER — TIOTROPIUM BROMIDE MONOHYDRATE 18 MCG IN CAPS: 18.0000 ug | ORAL_CAPSULE | Freq: Every day | RESPIRATORY_TRACT | 12 refills | Status: DC

## 2015-09-26 MED ORDER — MOMETASONE FURO-FORMOTEROL FUM 200-5 MCG/ACT IN AERO: 2.0000 | INHALATION_SPRAY | Freq: Two times a day (BID) | RESPIRATORY_TRACT | 5 refills | Status: DC

## 2015-09-26 MED ORDER — ALBUTEROL SULFATE (2.5 MG/3ML) 0.083% IN NEBU: 3.0000 mL | INHALATION_SOLUTION | RESPIRATORY_TRACT | 12 refills | Status: DC | PRN

## 2015-09-26 NOTE — Clinical Social Work Note (Signed)
Pt ready for discharge today. CSW discussed pt with RN and RNCM, pt will be returning to his truck. Pt also has shelter information. RNCM provided information for Medication Management. CSW is signing off as no further needs identified.   Dede Query, MSW, LCSW  Clinical Social Worker  [phone]

## 2015-09-26 NOTE — Discharge Summary (Signed)
[facility] - [place] at [facility]   PATIENT NAME: Chris Case    MR#:  [id number]  DATE OF BIRTH:  02/24/1971  DATE OF ADMISSION:  09/23/2015 ADMITTING PHYSICIAN: Marguarite Arbour, MD  DATE OF DISCHARGE: 09/26/2015 10:15 AM  PRIMARY CARE PHYSICIAN: [facility]     ADMISSION DIAGNOSIS:  Hypoxia [R09.02]  DISCHARGE DIAGNOSIS:  Principal Problem:   Acute on chronic respiratory failure with hypoxia and hypercapnia (HCC) Active Problems:   COPD exacerbation (HCC)   Edema, lower extremity   Acute bronchitis   Abnormal ECG   SECONDARY DIAGNOSIS:   Past Medical History:  Diagnosis Date  . COPD (chronic obstructive pulmonary disease) (HCC)   . Emphysema lung (HCC)     .pro HOSPITAL COURSE:  Patient is 45 year old African male with past history significant for history of chronic respiratory failure due to COPD, who was supposed to be on oxygen therapy, but due to living sedation is not getting oxygen therapy for the past 6 months, presents to the hospital with complaints of left-sided swelling and worsening shortness of breath, wheezing, some cough with clear. Few phlegm production. In emergency room, patient was noted to be hypoxic. Chest x-ray was unremarkable, no pneumonia. Patient improved on DuoNeb, Dulera, levofloxacin. He was felt to be stable to be discharged home. His oxygen level was checked on exertion on room air and patient did not qualify for oxygen at home.  Discussion by problem: #1. Acute on chronic respiratory failure with hypoxia and likely hypercapnia due to COPD exacerbation, improved with current therapy, continue DuoNeb, Dulera, levofloxacin to complete course, weaned off oxygen , taper steroids orally. The patient is being discharged to a shelter.  #2. COPD exacerbation due to acute bronchitis, continue levofloxacin for 6 more days to complete course, unable to get sputum culture as patient does not  expectorate  much #3 lower extremity edema, suspect right-sided heart failure due to emphysema/COPD, echocardiogram was performed, normal ejection fraction, normal valvular function,  pulmonary artery pressures could not have been accurately estimated, supportive therapy was provided, patient may benefit from pulmonology evaluation as outpatient, questionable right-sided heart catheterization in the future   #4. Thrombocytopenia, HIT test is taken, platelet count has improved, follow-up as outpatient, suspect chronic   DISCHARGE CONDITIONS:   Stable  CONSULTS OBTAINED:    DRUG ALLERGIES:  No Known Allergies  DISCHARGE MEDICATIONS:   Discharge Medication List as of 09/26/2015  9:56 AM    START taking these medications   Details  albuterol (PROVENTIL) (2.5 MG/3ML) 0.083% nebulizer solution Inhale 3 mLs into the lungs every 4 (four) hours as needed for wheezing or shortness of breath., Starting Tue 09/26/2015, Normal    aspirin EC 81 MG EC tablet Take 1 tablet (81 mg total) by mouth daily., Starting Tue 09/26/2015, Normal    levofloxacin (LEVAQUIN) 500 MG tablet Take 1 tablet (500 mg total) by mouth daily., Starting Tue 09/26/2015, Normal    mometasone-formoterol (DULERA) 200-5 MCG/ACT AERO Inhale 2 puffs into the lungs 2 (two) times daily., Starting Tue 09/26/2015, Normal    predniSONE (STERAPRED UNI-PAK 21 TAB) 10 MG (21) TBPK tablet Take 1 tablet (10 mg total) by mouth daily. Please take 6 pills in the morning on the day one, then taper by 1 pill daily until finished, thank you, Starting Tue 09/26/2015, Normal      CONTINUE these medications which have CHANGED   Details  tiotropium (SPIRIVA) 18 MCG inhalation capsule Place 1 capsule (18 mcg  total) into inhaler and inhale daily., Starting Tue 09/26/2015, Normal      STOP taking these medications     albuterol (PROVENTIL HFA;VENTOLIN HFA) 108 (90 Base) MCG/ACT inhaler      OXYGEN          DISCHARGE INSTRUCTIONS:    Patient is to follow-up  with primary care physician as outpatient  If you experience worsening of your admission symptoms, develop shortness of breath, life threatening emergency, suicidal or homicidal thoughts you must seek medical attention immediately by calling 911 or calling your MD immediately  if symptoms less severe.  You Must read complete instructions/literature along with all the possible adverse reactions/side effects for all the Medicines you take and that have been prescribed to you. Take any new Medicines after you have completely understood and accept all the possible adverse reactions/side effects.   Please note  You were cared for by a hospitalist during your hospital stay. If you have any questions about your discharge medications or the care you received while you were in the hospital after you are discharged, you can call the unit and asked to speak with the hospitalist on call if the hospitalist that took care of you is not available. Once you are discharged, your primary care physician will handle any further medical issues. Please note that NO REFILLS for any discharge medications will be authorized once you are discharged, as it is imperative that you return to your primary care physician (or establish a relationship with a primary care physician if you do not have one) for your aftercare needs so that they can reassess your need for medications and monitor your lab values.    Today   CHIEF COMPLAINT:   Chief Complaint  Patient presents with  . Foot Swelling    HISTORY OF PRESENT ILLNESS:  Chris Case  is a 45 y.o. male with a known history of chronic respiratory failure due to COPD, who was supposed to be on oxygen therapy, but due to living sedation is not getting oxygen therapy for the past 6 months, presents to the hospital with complaints of left-sided swelling and worsening shortness of breath, wheezing, some cough with clear. Few phlegm production. In emergency room, patient was noted  to be hypoxic. Chest x-ray was unremarkable, no pneumonia. Patient improved on DuoNeb, Dulera, levofloxacin. He was felt to be stable to be discharged home. His oxygen level was checked on exertion on room air and patient did not qualify for oxygen at home.  Discussion by problem: #1. Acute on chronic respiratory failure with hypoxia and likely hypercapnia due to COPD exacerbation, improved with current therapy, continue DuoNeb, Dulera, levofloxacin to complete course, weaned off oxygen , taper steroids orally. The patient is being discharged to a shelter.  #2. COPD exacerbation due to acute bronchitis, continue levofloxacin for 6 more days to complete course, unable to get sputum culture as patient does not  expectorate much #3 lower extremity edema, suspect right-sided heart failure due to emphysema/COPD, echocardiogram was performed, normal ejection fraction, normal valvular function,  pulmonary artery pressures could not have been accurately estimated, supportive therapy was provided, patient may benefit from pulmonology evaluation as outpatient, questionable right-sided heart catheterization in the future   #4. Thrombocytopenia, HIT test is taken, platelet count has improved, follow-up as outpatient, suspect chronic     VITAL SIGNS:  Blood pressure 108/67, pulse 85, temperature 99.5 F (37.5 C), temperature source Oral, resp. rate 18, height  (1.676 m), weight 64 kg (  141 lb 3.2 oz), SpO2 93 %.  I/O:   Intake/Output Summary (Last 24 hours) at 09/26/15 1109 Last data filed at 09/25/15 2100  Gross per 24 hour  Intake              240 ml  Output                1 ml  Net              239 ml    PHYSICAL EXAMINATION:  GENERAL:  45 y.o.-year-old patient lying in the bed with no acute distress.  EYES: Pupils equal, round, reactive to light and accommodation. No scleral icterus. Extraocular muscles intact.  HEENT: Head atraumatic, normocephalic. Oropharynx and nasopharynx clear.  NECK:   Supple, no jugular venous distention. No thyroid enlargement, no tenderness.  LUNGS:Better air entrance bilaterally,  no wheezing, rales,rhonchi or crepitation. No use of accessory muscles of respiration.  CARDIOVASCULAR: S1, S2 normal. No murmurs, rubs, or gallops.  ABDOMEN: Soft, non-tender, non-distended. Bowel sounds present. No organomegaly or mass.  EXTREMITIES: No pedal edema, cyanosis, or clubbing.  NEUROLOGIC: Cranial nerves II through XII are intact. Muscle strength 5/5 in all extremities. Sensation intact. Gait not checked.  PSYCHIATRIC: The patient is alert and oriented x 3.  SKIN: No obvious rash, lesion, or ulcer.   DATA REVIEW:   CBC  Recent Labs Lab 09/26/15 0515  WBC 7.5  HGB 14.2  HCT 42.7  PLT 94*    Chemistries   Recent Labs Lab 09/25/15 0516  NA 141  K 4.3  CL 101  CO2 38*  GLUCOSE 104*  BUN 15  CREATININE 0.83  CALCIUM 8.4*  AST 23  ALT 13*  ALKPHOS 30*  BILITOT 0.8    Cardiac Enzymes  Recent Labs Lab 09/23/15 2002  TROPONINI <0.03    Microbiology Results  No results found for this or any previous visit.  RADIOLOGY:  No results found.  EKG:   Orders placed or performed during the hospital encounter of 09/23/15  . EKG 12-Lead  . EKG 12-Lead  . ED EKG within 10 minutes  . ED EKG within 10 minutes      Management plans discussed with the patient, family and they are in agreement.  CODE STATUS:     Code Status Orders        Start     Ordered   09/24/15 1354  Full code  Continuous     09/24/15 1353    Code Status History    Date Active Date Inactive Code Status Order ID Comments User Context   09/24/2015  1:53 PM 09/24/2015  2:30 PM Full Code [id number]  Marguarite Arbour, MD Inpatient      TOTAL TIME TAKING CARE OF THIS PATIENT: 40  minutes.    Katharina Caper M.D on 09/26/2015 at 11:09 AM  Between 7am to 6pm - Pager - [phone]  After 6pm go to [email or web address] - password [email or web address] [facility]  [place] [facility]   Office  [phone]  CC: Primary care physician; [facility]

## 2015-09-26 NOTE — Progress Notes (Signed)
Pt being discharge home, discharge instructions and prescriptions reviewed with pt states understanding, pt with no complaints, no distress or discomfort noted

## 2016-05-09 ENCOUNTER — Inpatient Hospital Stay: Payer: Medicaid Other

## 2016-05-09 ENCOUNTER — Inpatient Hospital Stay: Payer: Medicaid Other | Attending: Oncology | Admitting: Oncology

## 2016-05-09 ENCOUNTER — Encounter: Payer: Self-pay | Admitting: Oncology

## 2016-05-09 VITALS — BP 139/99 | HR 84 | Temp 97.1°F | Resp 20 | Ht 65.75 in | Wt 153.2 lb

## 2016-05-09 DIAGNOSIS — D696 Thrombocytopenia, unspecified: Secondary | ICD-10-CM

## 2016-05-09 DIAGNOSIS — J9621 Acute and chronic respiratory failure with hypoxia: Secondary | ICD-10-CM | POA: Insufficient documentation

## 2016-05-09 DIAGNOSIS — Z7982 Long term (current) use of aspirin: Secondary | ICD-10-CM | POA: Insufficient documentation

## 2016-05-09 DIAGNOSIS — Z87891 Personal history of nicotine dependence: Secondary | ICD-10-CM | POA: Diagnosis not present

## 2016-05-09 DIAGNOSIS — Z9981 Dependence on supplemental oxygen: Secondary | ICD-10-CM

## 2016-05-09 DIAGNOSIS — R600 Localized edema: Secondary | ICD-10-CM | POA: Insufficient documentation

## 2016-05-09 LAB — DIFFERENTIAL
Basophils Absolute: 0.1 10*3/uL (ref 0–0.1)
Basophils Relative: 1 %
EOS PCT: 2 %
Eosinophils Absolute: 0.1 10*3/uL (ref 0–0.7)
LYMPHS ABS: 1 10*3/uL (ref 1.0–3.6)
LYMPHS PCT: 15 %
MONO ABS: 0.6 10*3/uL (ref 0.2–1.0)
Monocytes Relative: 8 %
Neutro Abs: 5.2 10*3/uL (ref 1.4–6.5)
Neutrophils Relative %: 74 %

## 2016-05-09 LAB — FOLATE: Folate: 8.9 ng/mL (ref >5.9)

## 2016-05-09 LAB — PATHOLOGIST SMEAR REVIEW

## 2016-05-09 LAB — CBC
HCT: 40.1 % (ref 40.0–52.0)
HEMOGLOBIN: 13 g/dL (ref 13.0–18.0)
MCH: 31.6 pg (ref 26.0–34.0)
MCHC: 32.4 g/dL (ref 32.0–36.0)
MCV: 97.3 fL (ref 80.0–100.0)
Platelets: 99 10*3/uL — ABNORMAL LOW (ref 150–440)
RBC: 4.13 MIL/uL — AB (ref 4.40–5.90)
RDW: 14.7 % — ABNORMAL HIGH (ref 11.5–14.5)
WBC: 7 10*3/uL (ref 3.8–10.6)

## 2016-05-09 LAB — VITAMIN B12: Vitamin B-12: 425 pg/mL (ref 180–914)

## 2016-05-09 NOTE — Progress Notes (Signed)
Referred here by [facility]

## 2016-05-09 NOTE — Progress Notes (Signed)
Hematology/Oncology Consult note [facility] Telephone:([phone] [phone]  Patient Care Team: [facility] as PCP - General (General Practice)   Name of the patient: Chris Case  [id number]  05-20-1970    Reason for referral- Thrombocytopenia   Referring physician- Dr. Frederico Hamman  Date of visit: 05/09/16   History of presenting illness- patient is a 46 year old African-American male was then referred to [place] for thrombocytopenia. Patient has had chronic thrombocytopenia dating back to 2015 and his platelet counts range between 80-100. During this time his white count and H&H have been within normal limits. CBC from 1/207 showed wbc of 7.2, H&H of 5.2/47.1 with pltelet count of 209. MCV was high norma at 99  Patient has been a long-time smoker and smoked about 3 packs of cigarettes per day but quit smoking in September 2017. He has oxygen-dependent COPD and is currently on 3 L of oxygen. He is saturating at 93% on oxygen today. Patient reports feeling fatigued and has the left bilateral lower extremity swelling over the last couple of months which is new for him. Also reports exertional shortness of breath   ECOG PS- 2  Pain scale- 0   Review of systems- Review of Systems  Constitutional: Positive for malaise/fatigue. Negative for chills, fever and weight loss.  HENT: Negative for congestion, ear discharge and nosebleeds.   Eyes: Negative for blurred vision.  Respiratory: Positive for shortness of breath. Negative for cough, hemoptysis, sputum production and wheezing.   Cardiovascular: Positive for leg swelling. Negative for chest pain, palpitations, orthopnea and claudication.  Gastrointestinal: Negative for abdominal pain, blood in stool, constipation, diarrhea, heartburn, melena, nausea and vomiting.  Genitourinary: Negative for dysuria, flank pain, frequency, hematuria and urgency.  Musculoskeletal: Negative for back pain,  joint pain and myalgias.  Skin: Negative for rash.  Neurological: Positive for weakness. Negative for dizziness, tingling, focal weakness, seizures and headaches.  Endo/Heme/Allergies: Does not bruise/bleed easily.  Psychiatric/Behavioral: Negative for depression and suicidal ideas. The patient does not have insomnia.     No Known Allergies  Patient Active Problem List   Diagnosis Date Noted  . Acute bronchitis 09/26/2015  . COPD exacerbation ([place]) 09/24/2015  . Acute on chronic respiratory failure with hypoxia and hypercapnia ([place]) 09/24/2015  . Abnormal ECG 09/24/2015  . Edema, lower extremity 09/24/2015     Past Medical History:  Diagnosis Date  . COPD (chronic obstructive pulmonary disease) ([place])   . Emphysema lung ([place])      Past Surgical History:  Procedure Laterality Date  . APPENDECTOMY      Social History   Social History  . Marital status: Married    Spouse name: N/A  . Number of children: N/A  . Years of education: N/A   Occupational History  . Not on file.   Social History Main Topics  . Smoking status: Former Smoker    Packs/day: 3.00    Years: 20.00    Types: Cigarettes    Quit date: 04/11/2016  . Smokeless tobacco: Never Used  . Alcohol use No     Comment: former drinker-1 pint of gin/week-quit 2015  . Drug use: Yes    Types: Marijuana     Comment: one joint /day x 2 y  . Sexual activity: Yes   Other Topics Concern  . Not on file   Social History Narrative  . No narrative on file     No family history on file.   Current Outpatient Prescriptions:  .  albuterol (PROVENTIL) (2.5 MG/3ML) 0.083% nebulizer solution, Inhale 3 mLs into the lungs every 4 (four) hours as needed for wheezing or shortness of breath., Disp: 75 mL, Rfl: 12 .  aspirin EC 81 MG EC tablet, Take 1 tablet (81 mg total) by mouth daily., Disp: 30 tablet, Rfl: 6 .  mometasone-formoterol (DULERA) 200-5 MCG/ACT AERO, Inhale 2 puffs into the lungs 2 (two) times daily., Disp: 1  Inhaler, Rfl: 5 .  OXYGEN, Inhale 2 L into the lungs., Disp: , Rfl:  .  tiotropium (SPIRIVA) 18 MCG inhalation capsule, Place 1 capsule (18 mcg total) into inhaler and inhale daily., Disp: 30 capsule, Rfl: 12   Physical exam:  Vitals:   05/09/16 1116  BP: (!) 139/99  Pulse: 84  Resp: 20  Temp: 97.1 F (36.2 C)  TempSrc: Tympanic  Weight: 153 lb 3.5 oz (69.5 kg)  Height: 5' 5.75" (1.67 m)   Physical Exam  Constitutional: He is oriented to person, place, and time.  Patient is thin and appears in mild respiratory distress. He is up to oxygen  HENT:  Head: Normocephalic and atraumatic.  Eyes: EOM are normal. Pupils are equal, round, and reactive to light.  Neck: Normal range of motion.  Cardiovascular: Normal rate, regular rhythm and normal heart sounds.   Pulmonary/Chest: Effort normal and breath sounds normal.  Abdominal: Soft. Bowel sounds are normal.  Musculoskeletal: He exhibits edema (b/l +2).  Neurological: He is alert and oriented to person, place, and time.  Skin: Skin is warm and dry.       CMP Latest Ref Rng & Units 09/25/2015  Glucose 65 - 99 mg/dL 104(H)  BUN 6 - 20 mg/dL 15  Creatinine 0.61 - 1.24 mg/dL 0.83  Sodium 135 - 145 mmol/L 141  Potassium 3.5 - 5.1 mmol/L 4.3  Chloride 101 - 111 mmol/L 101  CO2 22 - 32 mmol/L 38(H)  Calcium 8.9 - 10.3 mg/dL 8.4(L)  Total Protein 6.5 - 8.1 g/dL 5.9(L)  Total Bilirubin 0.3 - 1.2 mg/dL 0.8  Alkaline Phos 38 - 126 U/L 30(L)  AST 15 - 41 U/L 23  ALT 17 - 63 U/L 13(L)   CBC Latest Ref Rng & Units 09/26/2015  WBC 3.8 - 10.6 K/uL 7.5  Hemoglobin 13.0 - 18.0 g/dL 14.2  Hematocrit 40.0 - 52.0 % 42.7  Platelets 150 - 440 K/uL 94(L)    No images are attached to the encounter.  No results found.  Assessment and plan- Patient is a 46 y.o. male  Who has been referred to [place] for isolated thrombocytopenia  1. Today I will obtain cbc with manual diff, pathology review of his smear and check B12, folate, HIV, Hep C and stool  H pylori antigen. Patient has chronic stable thrombocytopenia ranging between 100-130 over last 3 years. 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 No indication to do bone marrow biopsy for isolated thrombocytopenia. I will see patient back in 3 weeks time to discuss results of bloodwork. Patient is not on any medication that can cause thrombocytopenia  2. B/l LE edema- We will touch base with his PCP to evaluate this further including possible cardiology referral  Thank you for this kind referral and the opportunity to participate in the care of this patient   Visit Diagnosis 1. Thrombocytopenia ([place])     Dr. Randa Evens, MD, MPH [facility] Pager- [phone] 05/09/2016 1:48 PM

## 2016-05-10 LAB — HIV ANTIBODY (ROUTINE TESTING W REFLEX): HIV SCREEN 4TH GENERATION: NONREACTIVE

## 2016-05-10 LAB — HEPATITIS C ANTIBODY: HCV Ab: <0.1 s/co ratio (ref 0.0–0.9)

## 2016-05-21 ENCOUNTER — Other Ambulatory Visit: Payer: Self-pay

## 2016-05-21 DIAGNOSIS — D696 Thrombocytopenia, unspecified: Secondary | ICD-10-CM

## 2016-05-23 LAB — H. PYLORI ANTIGEN, STOOL: H. PYLORI STOOL AG, EIA: NEGATIVE

## 2016-05-28 ENCOUNTER — Inpatient Hospital Stay: Payer: Medicaid Other | Admitting: Oncology

## 2016-05-30 ENCOUNTER — Telehealth: Payer: Self-pay

## 2016-05-30 ENCOUNTER — Other Ambulatory Visit: Payer: Self-pay | Admitting: Internal Medicine

## 2016-05-30 DIAGNOSIS — R0602 Shortness of breath: Secondary | ICD-10-CM

## 2016-05-30 DIAGNOSIS — I208 Other forms of angina pectoris: Secondary | ICD-10-CM

## 2016-05-30 NOTE — Telephone Encounter (Signed)
LVM to call to r/s missed appt on 3/27.

## 2016-06-03 ENCOUNTER — Other Ambulatory Visit: Payer: Self-pay | Admitting: Internal Medicine

## 2016-06-03 DIAGNOSIS — R011 Cardiac murmur, unspecified: Secondary | ICD-10-CM

## 2016-06-03 DIAGNOSIS — R0602 Shortness of breath: Secondary | ICD-10-CM

## 2016-06-12 ENCOUNTER — Ambulatory Visit
Admission: RE | Admit: 2016-06-12 | Discharge: 2016-06-12 | Disposition: A | Discharge status: Home / Self Care | Payer: Medicaid Other | Source: Ambulatory Visit | Attending: Internal Medicine | Admitting: Internal Medicine

## 2016-06-12 ENCOUNTER — Ambulatory Visit: Payer: Medicaid Other

## 2016-06-12 DIAGNOSIS — R0602 Shortness of breath: Secondary | ICD-10-CM | POA: Diagnosis not present

## 2016-06-12 DIAGNOSIS — J449 Chronic obstructive pulmonary disease, unspecified: Secondary | ICD-10-CM | POA: Insufficient documentation

## 2016-06-12 DIAGNOSIS — J439 Emphysema, unspecified: Secondary | ICD-10-CM | POA: Diagnosis not present

## 2016-06-12 DIAGNOSIS — I340 Nonrheumatic mitral (valve) insufficiency: Secondary | ICD-10-CM | POA: Insufficient documentation

## 2016-06-12 DIAGNOSIS — I208 Other forms of angina pectoris: Secondary | ICD-10-CM | POA: Diagnosis present

## 2016-06-12 DIAGNOSIS — I071 Rheumatic tricuspid insufficiency: Secondary | ICD-10-CM | POA: Insufficient documentation

## 2016-06-12 DIAGNOSIS — R011 Cardiac murmur, unspecified: Secondary | ICD-10-CM | POA: Diagnosis present

## 2016-06-12 MED ORDER — REGADENOSON 0.4 MG/5ML IV SOLN
0.4000 mg | Freq: Once | INTRAVENOUS | Status: AC
  Administered 2016-06-12: 0.4 mg via INTRAVENOUS

## 2016-06-12 MED ORDER — TECHNETIUM TC 99M TETROFOSMIN IV KIT
31.7110 | PACK | Freq: Once | INTRAVENOUS | Status: AC | PRN
Start: 2016-06-12 — End: 2016-06-12
  Administered 2016-06-12: 31.711 via INTRAVENOUS

## 2016-06-12 MED ORDER — TECHNETIUM TC 99M TETROFOSMIN IV KIT
13.0000 | PACK | Freq: Once | INTRAVENOUS | Status: AC | PRN
  Administered 2016-06-12: 13.44 via INTRAVENOUS

## 2016-06-12 NOTE — Progress Notes (Signed)
*  PRELIMINARY RESULTS* Echocardiogram 2D Echocardiogram has been performed.  Cristela Blue 06/12/2016, 10:58 AM

## 2016-06-18 ENCOUNTER — Telehealth: Payer: Self-pay | Admitting: *Deleted

## 2016-06-18 NOTE — Telephone Encounter (Signed)
Called pt and left message. Pt did not show for appt 3/27. Scheduler called and left message 3/29 to see if he would like to r/s and then I called today and left message to r/s.  If pt calls back would be happy to reschedule his f/u appt.

## 2016-07-05 LAB — NM MYOCAR MULTI W/SPECT W/WALL MOTION / EF
CHL CUP MPHR: 174 {beats}/min
CHL CUP NUCLEAR SDS: 4
CHL CUP NUCLEAR SRS: 1
CHL CUP RESTING HR STRESS: 74 {beats}/min
CHL CUP STRESS STAGE 1 GRADE: 0 %
CHL CUP STRESS STAGE 1 SPEED: 0 mph
CHL CUP STRESS STAGE 2 GRADE: 0 %
CHL CUP STRESS STAGE 2 SPEED: 0 mph
CHL CUP STRESS STAGE 3 GRADE: 0 %
CHL CUP STRESS STAGE 4 DBP: 96 mmHg
CHL CUP STRESS STAGE 4 HR: 76 {beats}/min
CHL CUP STRESS STAGE 4 SBP: 164 mmHg
CHL CUP STRESS STAGE 4 SPEED: 0 mph
CSEPED: 1 min
CSEPEW: 1 METS
Exercise duration (sec): 1 s
LV dias vol: 116 mL (ref 62–150)
LV sys vol: 45 mL
Peak HR: 91 {beats}/min
Percent HR: 55 %
Percent of predicted max HR: 52 %
SSS: 5
Stage 1 HR: 73 {beats}/min
Stage 2 HR: 73 {beats}/min
Stage 3 HR: 91 {beats}/min
Stage 3 Speed: 0 mph
Stage 4 Grade: 0 %
TID: 1

## 2016-12-04 ENCOUNTER — Emergency Department: Payer: Medicaid Other

## 2016-12-04 ENCOUNTER — Inpatient Hospital Stay
Admission: EM | Admit: 2016-12-04 | Discharge: 2016-12-15 | DRG: 335 | Disposition: A | Discharge status: Home / Self Care | Payer: Medicaid Other | Attending: Surgery | Admitting: Surgery

## 2016-12-04 DIAGNOSIS — K5900 Constipation, unspecified: Secondary | ICD-10-CM

## 2016-12-04 DIAGNOSIS — Z6822 Body mass index (BMI) 22.0-22.9, adult: Secondary | ICD-10-CM

## 2016-12-04 DIAGNOSIS — Z825 Family history of asthma and other chronic lower respiratory diseases: Secondary | ICD-10-CM

## 2016-12-04 DIAGNOSIS — R1084 Generalized abdominal pain: Secondary | ICD-10-CM

## 2016-12-04 DIAGNOSIS — K56609 Unspecified intestinal obstruction, unspecified as to partial versus complete obstruction: Secondary | ICD-10-CM

## 2016-12-04 DIAGNOSIS — Z7982 Long term (current) use of aspirin: Secondary | ICD-10-CM

## 2016-12-04 DIAGNOSIS — E43 Unspecified severe protein-calorie malnutrition: Secondary | ICD-10-CM | POA: Diagnosis present

## 2016-12-04 DIAGNOSIS — K567 Ileus, unspecified: Secondary | ICD-10-CM | POA: Diagnosis not present

## 2016-12-04 DIAGNOSIS — Z9981 Dependence on supplemental oxygen: Secondary | ICD-10-CM

## 2016-12-04 DIAGNOSIS — Z7951 Long term (current) use of inhaled steroids: Secondary | ICD-10-CM

## 2016-12-04 DIAGNOSIS — K5652 Intestinal adhesions [bands] with complete obstruction: Principal | ICD-10-CM | POA: Diagnosis present

## 2016-12-04 DIAGNOSIS — Z79899 Other long term (current) drug therapy: Secondary | ICD-10-CM

## 2016-12-04 DIAGNOSIS — Z87891 Personal history of nicotine dependence: Secondary | ICD-10-CM

## 2016-12-04 DIAGNOSIS — J449 Chronic obstructive pulmonary disease, unspecified: Secondary | ICD-10-CM | POA: Diagnosis present

## 2016-12-04 LAB — LIPASE, BLOOD: Lipase: 21 U/L (ref 11–51)

## 2016-12-04 LAB — COMPREHENSIVE METABOLIC PANEL
ALBUMIN: 4.4 g/dL (ref 3.5–5.0)
ALK PHOS: 44 U/L (ref 38–126)
ALT: 20 U/L (ref 17–63)
AST: 27 U/L (ref 15–41)
Anion gap: 12 (ref 5–15)
BILIRUBIN TOTAL: 1.8 mg/dL — AB (ref 0.3–1.2)
BUN: 15 mg/dL (ref 6–20)
CALCIUM: 9.3 mg/dL (ref 8.9–10.3)
CO2: 35 mmol/L — ABNORMAL HIGH (ref 22–32)
CREATININE: 0.88 mg/dL (ref 0.61–1.24)
Chloride: 91 mmol/L — ABNORMAL LOW (ref 101–111)
GFR calc Af Amer: >60 mL/min (ref >60)
GLUCOSE: 121 mg/dL — AB (ref 65–99)
Potassium: 3.1 mmol/L — ABNORMAL LOW (ref 3.5–5.1)
Sodium: 138 mmol/L (ref 135–145)
TOTAL PROTEIN: 8.1 g/dL (ref 6.5–8.1)

## 2016-12-04 LAB — CBC
HCT: 42.1 % (ref 40.0–52.0)
Hemoglobin: 14.3 g/dL (ref 13.0–18.0)
MCH: 32.1 pg (ref 26.0–34.0)
MCHC: 33.9 g/dL (ref 32.0–36.0)
MCV: 94.8 fL (ref 80.0–100.0)
PLATELETS: 128 10*3/uL — AB (ref 150–440)
RBC: 4.44 MIL/uL (ref 4.40–5.90)
RDW: 13.5 % (ref 11.5–14.5)
WBC: 12.8 10*3/uL — ABNORMAL HIGH (ref 3.8–10.6)

## 2016-12-04 MED ORDER — LIDOCAINE HCL 2 % EX GEL: 1.0000 "application " | Freq: Once | CUTANEOUS | Status: DC

## 2016-12-04 MED ORDER — MORPHINE SULFATE (PF) 4 MG/ML IV SOLN
4.0000 mg | Freq: Once | INTRAVENOUS | Status: AC
  Administered 2016-12-04: 4 mg via INTRAVENOUS
  Filled 2016-12-04: qty 1

## 2016-12-04 MED ORDER — IOPAMIDOL (ISOVUE-300) INJECTION 61%
100.0000 mL | Freq: Once | INTRAVENOUS | Status: AC | PRN
  Administered 2016-12-04: 100 mL via INTRAVENOUS

## 2016-12-04 MED ORDER — SODIUM CHLORIDE 0.9 % IV BOLUS (SEPSIS)
1000.0000 mL | Freq: Once | INTRAVENOUS | Status: AC
  Administered 2016-12-04: 1000 mL via INTRAVENOUS

## 2016-12-04 NOTE — ED Notes (Signed)
Ng tube inserted left nares without diff.  Pt tolerated well.  md aware.

## 2016-12-04 NOTE — ED Triage Notes (Signed)
Pt presents to ED with c/o difficulty having a bowel movement and abd cramping at his "belly button". Pain has been intermittent for the past 4 days.  States he has noticed a decrease in urine output and has not voided at all for the past 3 days.  Pt states "you can hear the liquid floating around in here through" pointing to his upper abd. Pt alert and calm at this time with no distress noted. Home O2 in place via nasal cannula set at 2L.

## 2016-12-04 NOTE — ED Provider Notes (Signed)
[facility] Emergency Department Provider Note  ____________________________________________  Time seen: Approximately 11:04 PM  I have reviewed the triage vital signs and the nursing notes.   HISTORY  Chief Complaint Abdominal Pain and Constipation    HPI Tunis Gentle is a 46 y.o. male who complains of generalized abdominal pain worsening over the past 3-4 days. Feels like his abdomen is swelling as well. Also reports constipation and inability to pass stools as well as a few episodes of vomiting. History of appendectomy but no other abdominal surgeries. Also reports in the last 3 days he has been unable to void despite drinking fluids.  Abdominal pain is nonradiating, no aggravating or alleviating factors. Moderate intensity. Cramping feeling.     Past Medical History:  Diagnosis Date  . COPD (chronic obstructive pulmonary disease) (HCC)   . Emphysema lung (HCC)   . 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 Acute bronchitis 09/26/2015  . COPD exacerbation (H[facility]) 09/24/2015  . Acute on chronic respiratory failure with hypoxia and hypercapnia (HCC) 09/24/2015  . Abnormal ECG 09/24/2015  . Edema, lower extremity 09/24/2015     Past Surgical History:  Procedure Laterality Date  . APPENDECTOMY       Prior to Admission medications   Medication Sig Start Date End Date Taking? Authorizing Provider  albuterol (PROVENTIL) (2.5 MG/3ML) 0.083% nebulizer solution Inhale 3 mLs into the lungs every 4 (four) hours as needed for wheezing or shortness of breath. 09/26/15   Katharina Caper, MD  aspirin EC 81 MG EC tablet Take 1 tablet (81 mg total) by mouth daily. 09/26/15   Katharina Caper, MD  mometasone-formoterol (DULERA) 200-5 MCG/ACT AERO Inhale 2 puffs into the lungs 2 (two) times daily. 09/26/15   Katharina Caper, MD  OXYGEN Inhale 2 L into the lungs.    [provider]   tiotropium (SPIRIVA) 18 MCG inhalation capsule Place 1 capsule (18 mcg total) into inhaler and inhale daily. 09/26/15   Katharina Caper, MD     Allergies Patient has no known allergies.   Family History  Problem Relation Age of Onset  . COPD Mother     Social History Social History  Substance Use Topics  . Smoking status: Former Smoker    Packs/day: 3.00    Years: 20.00    Types: Cigarettes    Quit date: 04/11/2016  . Smokeless tobacco: Never Used  . Alcohol use No     Comment: former drinker-1 pint of gin/week-quit 2015    Review of Systems  Constitutional:   No fever or chills.  ENT:   No sore throat. No rhinorrhea. Cardiovascular:   No chest pain or syncope. Respiratory:   No dyspnea or cough. Gastrointestinal:   positive as above for abdominal pain, vomiting, constipation..  Musculoskeletal:   Negative for focal pain or swelling All other systems reviewed and are negative except as documented above in ROS and HPI.  ____________________________________________   PHYSICAL EXAM:  VITAL SIGNS: ED Triage Vitals  Enc Vitals Group     BP 12/04/16 1959 (!) 135/91     Pulse Rate 12/04/16 1959 (!) 103     Resp 12/04/16 1959 20     Temp 12/04/16 1959 99 F (37.2 C)     Temp Source 12/04/16 1959 Oral     SpO2 12/04/16 1959 93 %     Weight 12/04/16 2000 135 lb (  61.2 kg)     Height 12/04/16 2000  (1.676 m)     Head Circumference --      Peak Flow --      Pain Score 12/04/16 1959 8     Pain Loc --      Pain Edu? --      Excl. in GC? --     Vital signs reviewed, nursing assessments reviewed.   Constitutional:   Alert and oriented. Well appearing and in no distress. Eyes:   No scleral icterus.  EOMI. No nystagmus. No conjunctival pallor. PERRL. ENT   Head:   Normocephalic and atraumatic.   Nose:   No congestion/rhinnorhea.    Mouth/Throat:   MMM, no pharyngeal erythema. No peritonsillar mass.    Neck:   No meningismus. Full  ROM Hematological/Lymphatic/Immunilogical:   No cervical lymphadenopathy. Cardiovascular:   RRR. Symmetric bilateral radial and DP pulses.  No murmurs.  Respiratory:   Normal respiratory effort without tachypnea/retractions. Breath sounds are clear and equal bilaterally. No wheezes/rales/rhonchi. Gastrointestinal:   Soft with generalized abdominal tenderness. Moderately distended. There is no CVA tenderness.  No rebound, rigidity, or guarding. Genitourinary:   deferred Musculoskeletal:   Normal range of motion in all extremities. No joint effusions.  No lower extremity tenderness.  No edema. Neurologic:   Normal speech and language.  Motor grossly intact. No gross focal neurologic deficits are appreciated.  Skin:    Skin is warm, dry and intact. No rash noted.  No petechiae, purpura, or bullae.  ____________________________________________    LABS (pertinent positives/negatives) (all labs ordered are listed, but only abnormal results are displayed) Labs Reviewed  COMPREHENSIVE METABOLIC PANEL - Abnormal; Notable for the following:       Result Value   Potassium 3.1 (*)    Chloride 91 (*)    CO2 35 (*)    Glucose, Bld 121 (*)    Total Bilirubin 1.8 (*)    All other components within normal limits  CBC - Abnormal; Notable for the following:    WBC 12.8 (*)    Platelets 128 (*)    All other components within normal limits  LIPASE, BLOOD  URINALYSIS, COMPLETE (UACMP) WITH MICROSCOPIC   ____________________________________________   EKG    ____________________________________________    RADIOLOGY  Ct Abdomen Pelvis W Contrast  Result Date: 12/04/2016 CLINICAL DATA:  Abdominal cramping with difficulty having bowel movement. Abdominal pain intermittent for days with decreased urine output. EXAM: CT ABDOMEN AND PELVIS WITH CONTRAST TECHNIQUE: Multidetector CT imaging of the abdomen and pelvis was performed using the standard protocol following bolus administration of  intravenous contrast. CONTRAST:  ISOVUE-300 IOPAMIDOL (ISOVUE-300) INJECTION 61% COMPARISON:  Chest CT 09/23/2013 FINDINGS: Lower chest: No acute abnormality. Hepatobiliary: Within normal. Pancreas: Within normal. Spleen: Within normal. Adrenals/Urinary Tract: Adrenal glands are normal. Kidneys are normal. Ureters and bladder are normal. Stomach/Bowel: Mild gastric distension. Previous appendectomy. There are multiple dilated fluid-filled small bowel loops measuring up to 4 cm in diameter. Few scattered air-fluid levels. No definite transition point identified although possibly within the right lower abdomen. Colon is decompressed from the mid transverse colon distally. Small amount of free fluid over the pelvis. No free peritoneal air. No pneumatosis. Vascular/Lymphatic: Within normal. Reproductive: Within normal. Other: Surgical sutures over the right abdominal wall likely due to prior hernia repair. Musculoskeletal: Within normal. IMPRESSION: Evidence of a mid to distal small bowel obstruction measuring up to 4 cm in diameter with possible transition point over the right lower  quadrant likely due to adhesions. Electronically Signed   By: Elberta Fortis M.D.   On: 12/04/2016 23:13    ____________________________________________   PROCEDURES Procedures  ____________________________________________   INITIAL IMPRESSION / ASSESSMENT AND PLAN / ED COURSE  Pertinent labs & imaging results that were available during my care of the patient were reviewed by me and considered in my medical decision making (see chart for details).  patient well appearing and not in distress, presents with symptoms concerning for small bowel obstruction versus perforation versus constipation. Possible urinary retention. Bladder scan only 70 mL's. CT abdomen and pelvis pending. Labs unremarkable. IV fluids for hydration.       ----------------------------------------- 11:29 PM on  12/04/2016 -----------------------------------------  CT reveals small bowel obstruction likely due to adhesions in the right lower quadrant from previous appendectomy. We'll place an NG tube. Dr. Earlene Plater with general surgery is currently in the operating room. Message left with circulating nurse for call back. ____________________________________________   FINAL CLINICAL IMPRESSION(S) / ED DIAGNOSES  Final diagnoses:  Generalized abdominal pain  Constipation, unspecified constipation type  SBO (small bowel obstruction) (HCC)      New Prescriptions   No medications on file     Portions of this note were generated with dragon dictation software. Dictation errors may occur despite best attempts at proofreading.    Sharman Cheek, MD 12/04/16 2330

## 2016-12-05 ENCOUNTER — Inpatient Hospital Stay: Payer: Medicaid Other

## 2016-12-05 DIAGNOSIS — K5900 Constipation, unspecified: Secondary | ICD-10-CM

## 2016-12-05 DIAGNOSIS — Z79899 Other long term (current) drug therapy: Secondary | ICD-10-CM | POA: Diagnosis not present

## 2016-12-05 DIAGNOSIS — Z7951 Long term (current) use of inhaled steroids: Secondary | ICD-10-CM | POA: Diagnosis not present

## 2016-12-05 DIAGNOSIS — Z9981 Dependence on supplemental oxygen: Secondary | ICD-10-CM | POA: Diagnosis not present

## 2016-12-05 DIAGNOSIS — E43 Unspecified severe protein-calorie malnutrition: Secondary | ICD-10-CM | POA: Diagnosis present

## 2016-12-05 DIAGNOSIS — K56609 Unspecified intestinal obstruction, unspecified as to partial versus complete obstruction: Secondary | ICD-10-CM

## 2016-12-05 DIAGNOSIS — K567 Ileus, unspecified: Secondary | ICD-10-CM | POA: Diagnosis not present

## 2016-12-05 DIAGNOSIS — Z7982 Long term (current) use of aspirin: Secondary | ICD-10-CM | POA: Diagnosis not present

## 2016-12-05 DIAGNOSIS — K5652 Intestinal adhesions [bands] with complete obstruction: Secondary | ICD-10-CM | POA: Diagnosis present

## 2016-12-05 DIAGNOSIS — Z6822 Body mass index (BMI) 22.0-22.9, adult: Secondary | ICD-10-CM | POA: Diagnosis not present

## 2016-12-05 DIAGNOSIS — J449 Chronic obstructive pulmonary disease, unspecified: Secondary | ICD-10-CM | POA: Diagnosis present

## 2016-12-05 DIAGNOSIS — Z87891 Personal history of nicotine dependence: Secondary | ICD-10-CM | POA: Diagnosis not present

## 2016-12-05 DIAGNOSIS — Z825 Family history of asthma and other chronic lower respiratory diseases: Secondary | ICD-10-CM | POA: Diagnosis not present

## 2016-12-05 LAB — CBC
HEMATOCRIT: 38.5 % — AB (ref 40.0–52.0)
HEMOGLOBIN: 13 g/dL (ref 13.0–18.0)
MCH: 32.2 pg (ref 26.0–34.0)
MCHC: 33.8 g/dL (ref 32.0–36.0)
MCV: 95.4 fL (ref 80.0–100.0)
Platelets: 109 10*3/uL — ABNORMAL LOW (ref 150–440)
RBC: 4.04 MIL/uL — ABNORMAL LOW (ref 4.40–5.90)
RDW: 13.6 % (ref 11.5–14.5)
WBC: 12.2 10*3/uL — ABNORMAL HIGH (ref 3.8–10.6)

## 2016-12-05 LAB — URINALYSIS, COMPLETE (UACMP) WITH MICROSCOPIC
BILIRUBIN URINE: NEGATIVE
Bacteria, UA: NONE SEEN
GLUCOSE, UA: NEGATIVE mg/dL
Hgb urine dipstick: NEGATIVE
KETONES UR: 5 mg/dL — AB
LEUKOCYTES UA: NEGATIVE
Nitrite: NEGATIVE
PH: 5 (ref 5.0–8.0)
Protein, ur: 30 mg/dL — AB

## 2016-12-05 LAB — BASIC METABOLIC PANEL
ANION GAP: 9 (ref 5–15)
BUN: 14 mg/dL (ref 6–20)
CALCIUM: 8.6 mg/dL — AB (ref 8.9–10.3)
CHLORIDE: 94 mmol/L — AB (ref 101–111)
CO2: 33 mmol/L — AB (ref 22–32)
Creatinine, Ser: 0.79 mg/dL (ref 0.61–1.24)
GFR calc Af Amer: >60 mL/min (ref >60)
GFR calc non Af Amer: >60 mL/min (ref >60)
GLUCOSE: 109 mg/dL — AB (ref 65–99)
POTASSIUM: 3.2 mmol/L — AB (ref 3.5–5.1)
Sodium: 136 mmol/L (ref 135–145)

## 2016-12-05 MED ORDER — KCL IN DEXTROSE-NACL 20-5-0.45 MEQ/L-%-% IV SOLN
INTRAVENOUS | Status: DC
  Administered 2016-12-05 – 2016-12-14 (×21): via INTRAVENOUS
  Filled 2016-12-05 (×31): qty 1000

## 2016-12-05 MED ORDER — PANTOPRAZOLE SODIUM 40 MG IV SOLR
40.0000 mg | Freq: Every day | INTRAVENOUS | Status: DC
  Administered 2016-12-05 – 2016-12-14 (×10): 40 mg via INTRAVENOUS
  Filled 2016-12-05 (×9): qty 40

## 2016-12-05 MED ORDER — MORPHINE SULFATE (PF) 2 MG/ML IV SOLN
2.0000 mg | Freq: Once | INTRAVENOUS | Status: AC
  Administered 2016-12-05: 2 mg via INTRAVENOUS
  Filled 2016-12-05: qty 1

## 2016-12-05 MED ORDER — ENOXAPARIN SODIUM 40 MG/0.4ML ~~LOC~~ SOLN
40.0000 mg | SUBCUTANEOUS | Status: DC
  Administered 2016-12-05 – 2016-12-15 (×11): 40 mg via SUBCUTANEOUS
  Filled 2016-12-05 (×12): qty 0.4

## 2016-12-05 NOTE — H&P (Addendum)
SURGICAL HISTORY & PHYSICAL (cpt [phone])  HISTORY OF PRESENT ILLNESS (HPI):  46 y.o. Case presented to [facility] ED tonight for abdominal pain with nausea and vomiting. Patient reports decreased flatus and absent BM's began 3.5 days ago (9/30), followed by onset of abdominal pain, a single tiny BM the following day, development of nausea/vomiting, and worsening of abdominal pain, prompting evaluation. Patient was complaining about return of abdominal pain upon medication wearing off with NG tube inserted, but sitting next to patient's bed and disconnected with nothing in canister. Upon confirming placement and connecting NG tube to suction, 400 mL brown-green fluid drained, and patient expressed immediate relief from nausea and then pain. He otherwise continues home O2 for COPD following a lapse without home O2 while he was homeless and reports smoking cessation 2 months ago. He denies any prior similar episodes and denies any fever/chills, CP, or SOB at this time while on O2 2L via [place].  PAST MEDICAL HISTORY (PMH):  Past Medical History:  Diagnosis Date  . COPD (chronic obstructive pulmonary disease) (HCC)   . Emphysema lung (HCC)   . 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 Otherwise negative.   PAST SURGICAL HISTORY (PSH):  Past Surgical History:  Procedure Laterality Date  . APPENDECTOMY  late 1980's    Reviewed. Otherwise negative.   MEDICATIONS:  Prior to Admission medications   Medication Sig Start Date End Date Taking? Authorizing Provider  albuterol (PROVENTIL) (2.5 MG/3ML) 0.083% nebulizer solution Inhale 3 mLs into the lungs every 4 (four) hours as needed for wheezing or shortness of breath. 09/26/15  Yes Katharina Caper, MD  aspirin EC 81 MG EC tablet Take 1 tablet (81 mg total) by mouth daily. 09/26/15  Yes Katharina Caper, MD  furosemide (LASIX) 20 MG tablet Take 20 mg by mouth daily. 10/11/16  Yes [provider]  mometasone-formoterol (DULERA)  200-5 MCG/ACT AERO Inhale 2 puffs into the lungs 2 (two) times daily. 09/26/15  Yes Katharina Caper, MD  OXYGEN Inhale 2 L into the lungs.   Yes [provider]  tiotropium (SPIRIVA) 18 MCG inhalation capsule Place 1 capsule (18 mcg total) into inhaler and inhale daily. 09/26/15  Yes Katharina Caper, MD     ALLERGIES:  No Known Allergies   SOCIAL HISTORY:  Social History   Social History  . Marital status: Married    Spouse name: N/A  . Number of children: N/A  . Years of education: N/A   Occupational History  . Not on file.   Social History Main Topics  . Smoking status: Former Smoker    Packs/day: 3.00    Years: 20.00    Types: Cigarettes    Quit date: 04/11/2016  . Smokeless tobacco: Never Used  . Alcohol use No     Comment: former drinker-1 pint of gin/week-quit 2015  . Drug use: Yes    Types: Marijuana     Comment: one joint /day x 2 y  . Sexual activity: Yes   Other Topics Concern  . Not on file   Social History Narrative  . No narrative on file    The patient currently resides (home / rehab facility / nursing home): Home The patient normally is (ambulatory / bedbound): Ambulatory  FAMILY HISTORY:  Family History  Problem Relation Age of Onset  . COPD Mother     Otherwise negative.   REVIEW OF SYSTEMS:  Constitutional: denies any other weight loss, fever, chills, or  sweats  Eyes: denies any other vision changes, history of eye injury  ENT: denies sore throat, hearing problems  Respiratory: denies shortness of breath, wheezing  Cardiovascular: denies chest pain, palpitations  Gastrointestinal: abdominal pain, N/V, and bowel function as per HPI  Genitourinary: denies burning with urination or urinary frequency Musculoskeletal: denies any other joint pains or cramps  Skin: Denies any other rashes or skin discolorations  Neurological: denies any other headache, dizziness, weakness  Psychiatric: denies any other depression, anxiety   All other review  of systems were otherwise negative.  VITAL SIGNS:  Temp:  [99 F (37.2 C)] 99 F (37.2 C) (10/03 1959) Pulse Rate:  [69-103] 69 (10/04 0100) Resp:  [18-20] 18 (10/03 2205) BP: (112-135)/(75-95) 127/88 (10/04 0100) SpO2:  [93 %-100 %] 98 % (10/04 0100) Weight:  [135 lb (61.2 kg)] 135 lb (61.2 kg) (10/03 2000)     Height:  (167.6 cm) Weight: 135 lb (61.2 kg) BMI (Calculated): 21.8   INTAKE/OUTPUT:  This shift: No intake/output data recorded.  Last 2 shifts: @  PHYSICAL EXAM:  Constitutional:  -- Normal body habitus  -- Awake, alert, and oriented x3  Eyes:  -- Pupils equally round and reactive to light  -- No scleral icterus  Ear, nose, throat:  -- No jugular venous distension  Pulmonary:  -- No crackles  -- Equal breath sounds bilaterally -- Breathing non-labored at rest on 2L O2 via [place] Cardiovascular:  -- S1, S2 present  -- No pericardial rubs  Gastrointestinal:  -- Abdomen soft, nontender, and non-distended with no guarding or rebound tenderness following connection of NG tube to suction -- No abdominal masses appreciated, pulsatile or otherwise  Musculoskeletal and Integumentary:  -- Wounds or skin discoloration: Large well-healed transverse incision from RLQ to mid-lower abdomen -- Extremities: B/L UE and LE FROM, hands and feet warm, no edema  Neurologic:  -- Motor function: Intact and symmetric -- Sensation: Intact and symmetric  Labs:  CBC Latest Ref Rng & Units 12/04/2016 05/09/2016 09/26/2015  WBC 3.8 - 10.6 K/uL 12.8(H) 7.0 7.5  Hemoglobin 13.0 - 18.0 g/dL 40.9 81.1 91.4  Hematocrit 40.0 - 52.0 % 42.1 40.1 42.7  Platelets 150 - 440 K/uL 128(L) 99(L) 94(L)   CMP Latest Ref Rng & Units 12/04/2016 09/25/2015 09/23/2015  Glucose 65 - 99 mg/dL 782(N) 562(Z) 87  BUN 6 - 20 mg/dL Creatinine 0.61 - 1.24 mg/dL 3.08 6.57 8.46  Sodium 135 - 145 mmol/L 138 141 141  Potassium 3.5 - 5.1 mmol/L 3.1(L) 4.3 4.0  Chloride 101 - 111 mmol/L 91(L) 101  98(L)  CO2 22 - 32 mmol/L 35(H) 38(H) 38(H)  Calcium 8.9 - 10.3 mg/dL 9.3 9.6(E) 9.5(M)  Total Protein 6.5 - 8.1 g/dL 8.1 8.4(X) -  Total Bilirubin 0.3 - 1.2 mg/dL 3.2(G) 0.8 -  Alkaline Phos 38 - 126 U/L 44 30(L) -  AST 15 - 41 U/L 27 23 -  ALT 17 - 63 U/L 20 13(L) -   Imaging studies:  CT Abdomen and Pelvis with Contrast (12/05/2016) - personally reviewed and discussed with patient and his wife Mild gastric distension. Previous appendectomy. There are multiple dilated fluid-filled small bowel loops measuring up to 4 cm in diameter. Few scattered air-fluid levels. No definite transition point identified, although possibly within the right lower abdomen, likely due to adhesions. Colon is decompressed from the  mid transverse colon distally. Small amount of free fluid over  the pelvis. No free peritoneal air. No  pneumatosis.  Assessment/Plan: (ICD-10's: K70.52) 46 y.o. Case with complete small bowel obstruction, likely attributable to post-surgical adhesions following remote open appendectomy, complicated by relative hypokalemia pertinent comorbidities including COPD on home O2 for chronic hypoxia and 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              - NPO, IVF              - NG tube for nasogastric decompression             - monitor ongoing bowel function and abdominal exam              - anticipate symptomatic relief within 24 - 48 hours following NGT insertion, followed by "rumbling" the following day and flatus either the same day or the day following the "rumbling" with anticipated length of stay ~3 - 5 days with successful non-operative management for 8 of 10 patients with small bowel obstruction secondary to adhesions             - continue home O2 via [place] and nebulizers prn             - ambulation encouraged              - DVT prophylaxis  All of the above findings and recommendations were discussed with the patient, and all of his questions were answered to his expressed  satisfaction.  -- Scherrie Gerlach Earlene Plater, MD, RPVI [place]: [facility] General Surgery - Partnering for exceptional care. Office: [phone]

## 2016-12-05 NOTE — ED Notes (Signed)
Dr Earlene Plater in with pt.  ngtube connected to suction by dr Earlene Plater.  Pt tolerated well.  Pt waiting on admission.

## 2016-12-05 NOTE — Progress Notes (Signed)
CC: Small bowel obstruction Subjective: [name] history is reviewed both with Dr. Laurice Record in the chart. Today the patient states he has no abdominal pain and passed some gas today he also states he had a bowel movement. No further nausea or vomiting with nasogastric tube in place. He is asking for ice chips.  Objective: Vital signs in last 24 hours: Temp:  [98.5 F (36.9 C)-99 F (37.2 C)] 98.5 F (36.9 C) (10/04 0352) Pulse Rate:  [66-103] 87 (10/04 0352) Resp:  [16-20] 18 (10/04 0352) BP: (112-135)/(75-95) 127/77 (10/04 0352) SpO2:  [93 %-100 %] 93 % (10/04 0352) Weight:  [135 lb (61.2 kg)-181 lb 9.6 oz (82.4 kg)] 181 lb 9.6 oz (82.4 kg) (10/04 0352)    Intake/Output from previous day: 10/03 0701 - 10/04 0700 In: 25 [I.V.:25] Out: 610 [Urine:210] Intake/Output this shift: Total I/O In: 377 [I.V.:377] Out: 400 [Emesis/NG output:400]  Physical exam:  Awake alert and oriented nasogastric tube in place abdomen is distended and slightly tympanitic but otherwise soft and nontender. Large transverse right sided abdominal scars present without hernia. Calves are nontender no icterus no jaundice  Lab Results: CBC   Recent Labs  12/04/16 1959 12/05/16 0502  WBC 12.8* 12.2*  HGB 14.3 13.0  HCT 42.1 38.5*  PLT 128* 109*   BMET  Recent Labs  12/04/16 1959 12/05/16 0502  NA 138 136  K 3.1* 3.2*  CL 91* 94*  CO2 35* 33*  GLUCOSE 121* 109*  BUN 15 14  CREATININE 0.88 0.79  CALCIUM 9.3 8.6*   PT/INR No results for input(s): LABPROT, INR in the last 72 hours. ABG No results for input(s): PHART, HCO3 in the last 72 hours.  Invalid input(s): PCO2, PO2  Studies/Results: Dg Abdomen 1 View  Result Date: 12/05/2016 CLINICAL DATA:  Nasogastric tube placement. EXAM: ABDOMEN - 1 VIEW COMPARISON:  Chest x-ray 09/23/2015 and CT today FINDINGS: High KUB demonstrates patient's nasogastric tube with tip over the stomach in the left upper quadrant. Air-filled mildly prominent  small bowel loop in the left upper quadrant. No free peritoneal air. Contrast over the collecting system of the kidneys from recent CT scan. Lung bases are within normal. IMPRESSION: Nasogastric tube with tip over the stomach in the left upper quadrant. Mildly dilated small bowel loop in the left upper quadrant compatible known small bowel obstruction. Electronically Signed   By: Elberta Fortis M.D.   On: 12/05/2016 00:08   Ct Abdomen Pelvis W Contrast  Result Date: 12/04/2016 CLINICAL DATA:  Abdominal cramping with difficulty having bowel movement. Abdominal pain intermittent for days with decreased urine output. EXAM: CT ABDOMEN AND PELVIS WITH CONTRAST TECHNIQUE: Multidetector CT imaging of the abdomen and pelvis was performed using the standard protocol following bolus administration of intravenous contrast. CONTRAST:  ISOVUE-300 IOPAMIDOL (ISOVUE-300) INJECTION 61% COMPARISON:  Chest CT 09/23/2013 FINDINGS: Lower chest: No acute abnormality. Hepatobiliary: Within normal. Pancreas: Within normal. Spleen: Within normal. Adrenals/Urinary Tract: Adrenal glands are normal. Kidneys are normal. Ureters and bladder are normal. Stomach/Bowel: Mild gastric distension. Previous appendectomy. There are multiple dilated fluid-filled small bowel loops measuring up to 4 cm in diameter. Few scattered air-fluid levels. No definite transition point identified although possibly within the right lower abdomen. Colon is decompressed from the mid transverse colon distally. Small amount of free fluid over the pelvis. No free peritoneal air. No pneumatosis. Vascular/Lymphatic: Within normal. Reproductive: Within normal. Other: Surgical sutures over the right abdominal wall likely due to prior hernia repair. Musculoskeletal: Within normal. IMPRESSION:  Evidence of a mid to distal small bowel obstruction measuring up to 4 cm in diameter with possible transition point over the right lower quadrant likely due to adhesions.  Electronically Signed   By: Elberta Fortis M.D.   On: 12/04/2016 23:13    Anti-infectives: Anti-infectives    None      Assessment/Plan:  Labs and CT scan are personally reviewed. This case was discussed with Dr. Earlene Plater. Agree with conservative management for now but if he does not improve over the next 2448 hrs. then surgical intervention may be required. I discussed this with the patient. I will order films for this morning which had not yet been ordered and then for tomorrow morning again to compare and hopefully see some improvement in his partial small bowel obstruction.  [name], MD, FACS  12/05/2016

## 2016-12-05 NOTE — Progress Notes (Signed)
Patient seen and examined this evening for follow-up assessment. Patient reports sparse flatus and complete resolution of N/V following connection of NG tube to suction, but reports ongoing intermittent epigastric abdominal pain throughout today. BP 120/76 (BP Location: Right Arm)   Pulse 71   Temp 98.9 F (37.2 C) (Oral)   Resp 16   Ht  (1.676 m)   Wt 181 lb 9.6 oz (82.4 kg)   SpO2 97%   BMI 29.31 kg/m . Abdomen soft and ND with mild-/moderate- epigastric tenderness to palpation. Will order IV PPI qhs and morphine x1. Continue to monitor abdominal exam and bowel function, and follow up morning WBC, BMP, and abdominal x-ray.  Above recommendations and plans discussed with patient and all of his questions were answered to his expressed satisfaction.  -- Scherrie Gerlach Earlene Plater, MD, RPVI [place]: [facility] - Partnering for exceptional care. Office: [phone]

## 2016-12-05 NOTE — ED Notes (Signed)
Pt sleeping    Waiting on admission bed.  

## 2016-12-05 NOTE — ED Notes (Signed)
Report off to butch rn  

## 2016-12-05 NOTE — Plan of Care (Signed)
Received pt from ER @ 0345. Pt denies pain at this time. NG tube in place, connected to low intermittent suction with brown drainage. Pt reported that he has not urinated since Sunday (12/01/16). Bladder scan showed . 15 minutes later pt voided of orange urine. Encouraged pt to keep attempting to urinate throughout day.

## 2016-12-06 ENCOUNTER — Encounter: Payer: Self-pay | Admitting: Anesthesiology

## 2016-12-06 ENCOUNTER — Inpatient Hospital Stay: Payer: Medicaid Other | Admitting: Anesthesiology

## 2016-12-06 ENCOUNTER — Inpatient Hospital Stay: Payer: Medicaid Other

## 2016-12-06 ENCOUNTER — Encounter
Admission: EM | Disposition: A | Discharge status: Home / Self Care | Payer: Self-pay | Source: Home / Self Care | Attending: Surgery

## 2016-12-06 HISTORY — PX: LAPAROTOMY: SHX154

## 2016-12-06 LAB — BASIC METABOLIC PANEL
Anion gap: 7 (ref 5–15)
BUN: 8 mg/dL (ref 6–20)
CALCIUM: 8.4 mg/dL — AB (ref 8.9–10.3)
CO2: 32 mmol/L (ref 22–32)
Chloride: 97 mmol/L — ABNORMAL LOW (ref 101–111)
Creatinine, Ser: 0.78 mg/dL (ref 0.61–1.24)
GFR calc Af Amer: >60 mL/min (ref >60)
GLUCOSE: 109 mg/dL — AB (ref 65–99)
Potassium: 3.6 mmol/L (ref 3.5–5.1)
SODIUM: 136 mmol/L (ref 135–145)

## 2016-12-06 LAB — SURGICAL PCR SCREEN
MRSA, PCR: NEGATIVE
STAPHYLOCOCCUS AUREUS: NEGATIVE

## 2016-12-06 LAB — CBC WITH DIFFERENTIAL/PLATELET
BASOS ABS: 0 10*3/uL (ref 0–0.1)
BASOS PCT: 0 %
EOS ABS: 0.1 10*3/uL (ref 0–0.7)
EOS PCT: 1 %
HCT: 38 % — ABNORMAL LOW (ref 40.0–52.0)
Hemoglobin: 12.7 g/dL — ABNORMAL LOW (ref 13.0–18.0)
Lymphocytes Relative: 17 %
Lymphs Abs: 1.7 10*3/uL (ref 1.0–3.6)
MCH: 31.6 pg (ref 26.0–34.0)
MCHC: 33.4 g/dL (ref 32.0–36.0)
MCV: 94.4 fL (ref 80.0–100.0)
MONO ABS: 1.3 10*3/uL — AB (ref 0.2–1.0)
Monocytes Relative: 13 %
Neutro Abs: 6.9 10*3/uL — ABNORMAL HIGH (ref 1.4–6.5)
Neutrophils Relative %: 69 %
PLATELETS: 111 10*3/uL — AB (ref 150–440)
RBC: 4.03 MIL/uL — ABNORMAL LOW (ref 4.40–5.90)
RDW: 13.5 % (ref 11.5–14.5)
WBC: 10 10*3/uL (ref 3.8–10.6)

## 2016-12-06 SURGERY — LAPAROTOMY, EXPLORATORY
Anesthesia: General | Site: Abdomen | Wound class: Contaminated

## 2016-12-06 MED ORDER — BUPIVACAINE HCL (PF) 0.25 % IJ SOLN
INTRAMUSCULAR | Status: DC | PRN
  Administered 2016-12-06: 30 mL

## 2016-12-06 MED ORDER — NALOXONE HCL 0.4 MG/ML IJ SOLN: 0.4000 mg | INTRAMUSCULAR | Status: DC | PRN

## 2016-12-06 MED ORDER — DEXAMETHASONE SODIUM PHOSPHATE 10 MG/ML IJ SOLN
INTRAMUSCULAR | Status: AC
  Filled 2016-12-06: qty 1

## 2016-12-06 MED ORDER — ROCURONIUM BROMIDE 50 MG/5ML IV SOLN
INTRAVENOUS | Status: AC
  Filled 2016-12-06: qty 1

## 2016-12-06 MED ORDER — FENTANYL CITRATE (PF) 100 MCG/2ML IJ SOLN
INTRAMUSCULAR | Status: DC | PRN
  Administered 2016-12-06 (×2): 50 ug via INTRAVENOUS

## 2016-12-06 MED ORDER — ACETAMINOPHEN 10 MG/ML IV SOLN
INTRAVENOUS | Status: DC | PRN
  Administered 2016-12-06: 1000 mg via INTRAVENOUS

## 2016-12-06 MED ORDER — ONDANSETRON HCL 4 MG/2ML IJ SOLN: 4.0000 mg | Freq: Four times a day (QID) | INTRAMUSCULAR | Status: DC | PRN

## 2016-12-06 MED ORDER — DEXAMETHASONE SODIUM PHOSPHATE 10 MG/ML IJ SOLN
INTRAMUSCULAR | Status: DC | PRN
  Administered 2016-12-06: 5 mg via INTRAVENOUS

## 2016-12-06 MED ORDER — TIOTROPIUM BROMIDE MONOHYDRATE 18 MCG IN CAPS
18.0000 ug | ORAL_CAPSULE | Freq: Every day | RESPIRATORY_TRACT | Status: DC
  Administered 2016-12-06 – 2016-12-11 (×3): 18 ug via RESPIRATORY_TRACT
  Filled 2016-12-06: qty 5

## 2016-12-06 MED ORDER — SUCCINYLCHOLINE CHLORIDE 20 MG/ML IJ SOLN
INTRAMUSCULAR | Status: DC | PRN
  Administered 2016-12-06: 100 mg via INTRAVENOUS

## 2016-12-06 MED ORDER — PROPOFOL 10 MG/ML IV BOLUS
INTRAVENOUS | Status: AC
  Filled 2016-12-06: qty 20

## 2016-12-06 MED ORDER — ONDANSETRON HCL 4 MG/2ML IJ SOLN
INTRAMUSCULAR | Status: DC | PRN
  Administered 2016-12-06: 4 mg via INTRAVENOUS

## 2016-12-06 MED ORDER — ALBUTEROL SULFATE (2.5 MG/3ML) 0.083% IN NEBU: 3.0000 mL | INHALATION_SOLUTION | RESPIRATORY_TRACT | Status: DC | PRN

## 2016-12-06 MED ORDER — SODIUM CHLORIDE 0.9% FLUSH: 9.0000 mL | INTRAVENOUS | Status: DC | PRN

## 2016-12-06 MED ORDER — HYDROMORPHONE 1 MG/ML IV SOLN
INTRAVENOUS | Status: DC
  Administered 2016-12-06: 1.1 mg via INTRAVENOUS
  Administered 2016-12-06: 21:00:00 via INTRAVENOUS
  Administered 2016-12-07 (×2): 1.2 mg via INTRAVENOUS
  Administered 2016-12-07: 0.9 mg via INTRAVENOUS
  Administered 2016-12-07: 0.6 mg via INTRAVENOUS
  Administered 2016-12-07: 1.5 mg via INTRAVENOUS
  Administered 2016-12-08: 1.2 mg via INTRAVENOUS
  Administered 2016-12-08: 0.9 mg via INTRAVENOUS
  Administered 2016-12-08 (×2): 1.5 mg via INTRAVENOUS
  Administered 2016-12-08: 0.6 mg via INTRAVENOUS
  Administered 2016-12-08: 2.1 mg via INTRAVENOUS
  Administered 2016-12-09: 0.3 mg via INTRAVENOUS
  Administered 2016-12-09: 0 mg via INTRAVENOUS
  Administered 2016-12-09: 1.2 mg via INTRAVENOUS
  Administered 2016-12-09 (×3): 0.3 mg via INTRAVENOUS
  Administered 2016-12-10: 0 mg via INTRAVENOUS
  Administered 2016-12-10: 0 mL via INTRAVENOUS
  Administered 2016-12-10: 0 mg via INTRAVENOUS
  Administered 2016-12-10: 0.3 mg via INTRAVENOUS
  Administered 2016-12-10 (×2): 0 mg via INTRAVENOUS
  Filled 2016-12-06: qty 25

## 2016-12-06 MED ORDER — DIPHENHYDRAMINE HCL 12.5 MG/5ML PO ELIX
12.5000 mg | ORAL_SOLUTION | Freq: Four times a day (QID) | ORAL | Status: DC | PRN
  Filled 2016-12-06: qty 5

## 2016-12-06 MED ORDER — MIDAZOLAM HCL 2 MG/2ML IJ SOLN
INTRAMUSCULAR | Status: AC
  Filled 2016-12-06: qty 2

## 2016-12-06 MED ORDER — MOMETASONE FURO-FORMOTEROL FUM 200-5 MCG/ACT IN AERO
2.0000 | INHALATION_SPRAY | Freq: Two times a day (BID) | RESPIRATORY_TRACT | Status: DC
  Administered 2016-12-06 – 2016-12-15 (×17): 2 via RESPIRATORY_TRACT
  Filled 2016-12-06: qty 8.8

## 2016-12-06 MED ORDER — ONDANSETRON HCL 4 MG/2ML IJ SOLN
INTRAMUSCULAR | Status: AC
Start: 2016-12-06 — End: 2016-12-06
  Filled 2016-12-06: qty 2

## 2016-12-06 MED ORDER — FENTANYL CITRATE (PF) 100 MCG/2ML IJ SOLN
25.0000 ug | INTRAMUSCULAR | Status: DC | PRN
  Administered 2016-12-06 (×4): 25 ug via INTRAVENOUS

## 2016-12-06 MED ORDER — MIDAZOLAM HCL 5 MG/5ML IJ SOLN
INTRAMUSCULAR | Status: DC | PRN
  Administered 2016-12-06: 2 mg via INTRAVENOUS

## 2016-12-06 MED ORDER — FENTANYL CITRATE (PF) 100 MCG/2ML IJ SOLN
INTRAMUSCULAR | Status: AC
  Administered 2016-12-06: 25 ug via INTRAVENOUS
  Filled 2016-12-06: qty 2

## 2016-12-06 MED ORDER — SUCCINYLCHOLINE CHLORIDE 20 MG/ML IJ SOLN
INTRAMUSCULAR | Status: AC
Start: 2016-12-06 — End: 2016-12-06
  Filled 2016-12-06: qty 1

## 2016-12-06 MED ORDER — LACTATED RINGERS IV SOLN
INTRAVENOUS | Status: DC | PRN
  Administered 2016-12-06 (×2): via INTRAVENOUS

## 2016-12-06 MED ORDER — DIPHENHYDRAMINE HCL 50 MG/ML IJ SOLN: 12.5000 mg | Freq: Four times a day (QID) | INTRAMUSCULAR | Status: DC | PRN

## 2016-12-06 MED ORDER — SUGAMMADEX SODIUM 200 MG/2ML IV SOLN
INTRAVENOUS | Status: DC | PRN
  Administered 2016-12-06: 170 mg via INTRAVENOUS

## 2016-12-06 MED ORDER — LIDOCAINE HCL (CARDIAC) 20 MG/ML IV SOLN
INTRAVENOUS | Status: DC | PRN
  Administered 2016-12-06: 100 mg via INTRAVENOUS

## 2016-12-06 MED ORDER — FENTANYL CITRATE (PF) 100 MCG/2ML IJ SOLN
INTRAMUSCULAR | Status: AC
  Filled 2016-12-06: qty 2

## 2016-12-06 MED ORDER — DEXTROSE 5 % IV SOLN
2.0000 g | Freq: Once | INTRAVENOUS | Status: AC
  Administered 2016-12-06: 2 g via INTRAVENOUS
  Filled 2016-12-06: qty 2

## 2016-12-06 MED ORDER — ONDANSETRON HCL 4 MG/2ML IJ SOLN: 4.0000 mg | Freq: Once | INTRAMUSCULAR | Status: DC | PRN

## 2016-12-06 MED ORDER — PROPOFOL 10 MG/ML IV BOLUS
INTRAVENOUS | Status: DC | PRN
  Administered 2016-12-06: 160 mg via INTRAVENOUS

## 2016-12-06 MED ORDER — ACETAMINOPHEN 10 MG/ML IV SOLN
INTRAVENOUS | Status: AC
  Filled 2016-12-06: qty 100

## 2016-12-06 MED ORDER — KETOROLAC TROMETHAMINE 30 MG/ML IJ SOLN
INTRAMUSCULAR | Status: AC
Start: 2016-12-06 — End: 2016-12-06
  Filled 2016-12-06: qty 1

## 2016-12-06 MED ORDER — SUGAMMADEX SODIUM 200 MG/2ML IV SOLN
INTRAVENOUS | Status: AC
  Filled 2016-12-06: qty 2

## 2016-12-06 MED ORDER — ROCURONIUM BROMIDE 100 MG/10ML IV SOLN
INTRAVENOUS | Status: DC | PRN
  Administered 2016-12-06: 20 mg via INTRAVENOUS

## 2016-12-06 SURGICAL SUPPLY — 29 items
BLADE CLIPPER SURG (BLADE) ×3 IMPLANT
CANISTER SUCT 1200ML W/VALVE (MISCELLANEOUS) ×3 IMPLANT
CHLORAPREP W/TINT 26ML (MISCELLANEOUS) ×3 IMPLANT
DRAPE LAPAROTOMY 100X77 ABD (DRAPES) ×3 IMPLANT
DRSG OPSITE POSTOP 4X8 (GAUZE/BANDAGES/DRESSINGS) ×3 IMPLANT
DRSG TELFA 3X8 NADH (GAUZE/BANDAGES/DRESSINGS) ×3 IMPLANT
ELECT REM PT RETURN 9FT ADLT (ELECTROSURGICAL) ×3
ELECTRODE REM PT RTRN 9FT ADLT (ELECTROSURGICAL) ×1 IMPLANT
GAUZE SPONGE 4X4 12PLY STRL (GAUZE/BANDAGES/DRESSINGS) ×3 IMPLANT
GLOVE BIO SURGEON STRL SZ8 (GLOVE) ×6 IMPLANT
GOWN STRL REUS W/ TWL LRG LVL3 (GOWN DISPOSABLE) ×2 IMPLANT
GOWN STRL REUS W/TWL LRG LVL3 (GOWN DISPOSABLE) ×4
KIT RM TURNOVER STRD PROC AR (KITS) ×3 IMPLANT
LABEL OR SOLS (LABEL) ×3 IMPLANT
NDL SAFETY 22GX1.5 (NEEDLE) ×3 IMPLANT
NS IRRIG 1000ML POUR BTL (IV SOLUTION) ×3 IMPLANT
PACK BASIN MAJOR ARMC (MISCELLANEOUS) ×3 IMPLANT
PACK COLON CLEAN CLOSURE (MISCELLANEOUS) ×3 IMPLANT
SEPRAFILM MEMBRANE 5X6 (MISCELLANEOUS) ×3 IMPLANT
STAPLER SKIN PROX 35W (STAPLE) ×3 IMPLANT
SUT PDS AB 1 TP1 54 (SUTURE) ×12 IMPLANT
SUT PROLENE 0 CT 1 30 (SUTURE) ×9 IMPLANT
SUT SILK 3-0 (SUTURE) ×15 IMPLANT
SUT VIC AB 3-0 SH 27 (SUTURE) ×2
SUT VIC AB 3-0 SH 27X BRD (SUTURE) ×1 IMPLANT
SUT VICRYL 2 0 18  UND BR (SUTURE) ×2
SUT VICRYL 2 0 18 UND BR (SUTURE) ×1 IMPLANT
SYRINGE 10CC LL (SYRINGE) ×3 IMPLANT
TRAY FOLEY W/METER SILVER 16FR (SET/KITS/TRAYS/PACK) ×3 IMPLANT

## 2016-12-06 NOTE — Anesthesia Preprocedure Evaluation (Signed)
Anesthesia Evaluation  Patient identified by MRN, date of birth, ID band Patient awake    Reviewed: Allergy & Precautions, H&P , NPO status , [name] Chart, lab work & pertinent test results, reviewed documented beta blocker date and time   History of Anesthesia Complications Negative for: history of anesthetic complications  Airway Mallampati: III  TM Distance: >3 FB Neck ROM: full    Dental  (+) Dental Advidsory Given, Poor Dentition, Missing   Pulmonary shortness of breath and with exertion, COPD,  COPD inhaler and oxygen dependent, neg recent URI, former smoker,           Cardiovascular Exercise Tolerance: Good negative cardio ROS       Neuro/Psych negative neurological ROS  negative psych ROS   GI/Hepatic Neg liver ROS, Small bowel obstruction   Endo/Other  negative endocrine ROS  Renal/GU negative Renal ROS  negative genitourinary   Musculoskeletal   Abdominal   Peds  Hematology negative hematology ROS (+)   Anesthesia Other Findings Past Medical History: No date: COPD (chronic obstructive pulmonary disease) (HCC) No date: Emphysema lung (HCC) 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     Comment:  treated for 6 mo w medications   Reproductive/Obstetrics negative OB ROS                             Anesthesia Physical Anesthesia Plan  ASA: IV  Anesthesia Plan: General   Post-op Pain Management:    Induction: Intravenous, Rapid sequence and Cricoid pressure planned  PONV Risk Score and Plan: 2 and Ondansetron and Dexamethasone  Airway Management Planned: Oral ETT  Additional Equipment:   Intra-op Plan:   Post-operative Plan: Extubation in OR  Informed Consent: I have reviewed the patients History and Physical, chart, labs and discussed the procedure including the risks, benefits and alternatives for the proposed anesthesia with the patient or authorized representative who has  indicated his/her understanding and acceptance.   Dental Advisory Given  Plan Discussed with: Anesthesiologist, CRNA and Surgeon  Anesthesia Plan Comments:         Anesthesia Quick Evaluation

## 2016-12-06 NOTE — Progress Notes (Signed)
CC: sbo Subjective: This patient with a partial small bowel obstruction. He states he has minimal pain but is still requiring some pain medication. He is passing gas and had a bowel movement his nasogastric tube is in place and he's had no nausea or vomiting since being placed. Denies fevers or chills.  Objective: Vital signs in last 24 hours: Temp:  [98.4 F (36.9 C)-99 F (37.2 C)] 98.4 F (36.9 C) (10/05 0515) Pulse Rate:  [71-79] 72 (10/05 0515) Resp:  [16] 16 (10/05 0515) BP: (107-120)/(69-76) 107/74 (10/05 0515) SpO2:  [96 %-98 %] 96 % (10/05 0515) Last BM Date: 12/04/16 (pre admit - per patient)  Intake/Output from previous day: 10/04 0701 - 10/05 0700 In: 1620 [I.V.:1620] Out: 1825 [Urine:675; Emesis/NG output:1150] Intake/Output this shift: No intake/output data recorded.  Physical exam:  Vital signs are reviewed and stable afebrile. Abdomen is soft but distended and nontender scars noted in the right lower quadrant there is tympany.  Calves are nontender  No icterus no jaundice  Lab Results: CBC   Recent Labs  12/05/16 0502 12/06/16 0428  WBC 12.2* 10.0  HGB 13.0 12.7*  HCT 38.5* 38.0*  PLT 109* 111*   BMET  Recent Labs  12/05/16 0502 12/06/16 0428  NA 136 136  K 3.2* 3.6  CL 94* 97*  CO2 33* 32  GLUCOSE 109* 109*  BUN 14 8  CREATININE 0.79 0.78  CALCIUM 8.6* 8.4*   PT/INR No results for input(s): LABPROT, INR in the last 72 hours. ABG No results for input(s): PHART, HCO3 in the last 72 hours.  Invalid input(s): PCO2, PO2  Studies/Results: Dg Abdomen 1 View  Result Date: 12/05/2016 CLINICAL DATA:  Nasogastric tube placement. EXAM: ABDOMEN - 1 VIEW COMPARISON:  Chest x-ray 09/23/2015 and CT today FINDINGS: High KUB demonstrates patient's nasogastric tube with tip over the stomach in the left upper quadrant. Air-filled mildly prominent small bowel loop in the left upper quadrant. No free peritoneal air. Contrast over the collecting system of  the kidneys from recent CT scan. Lung bases are within normal. IMPRESSION: Nasogastric tube with tip over the stomach in the left upper quadrant. Mildly dilated small bowel loop in the left upper quadrant compatible known small bowel obstruction. Electronically Signed   By: Elberta Fortis M.D.   On: 12/05/2016 00:08   Ct Abdomen Pelvis W Contrast  Result Date: 12/04/2016 CLINICAL DATA:  Abdominal cramping with difficulty having bowel movement. Abdominal pain intermittent for days with decreased urine output. EXAM: CT ABDOMEN AND PELVIS WITH CONTRAST TECHNIQUE: Multidetector CT imaging of the abdomen and pelvis was performed using the standard protocol following bolus administration of intravenous contrast. CONTRAST:  ISOVUE-300 IOPAMIDOL (ISOVUE-300) INJECTION 61% COMPARISON:  Chest CT 09/23/2013 FINDINGS: Lower chest: No acute abnormality. Hepatobiliary: Within normal. Pancreas: Within normal. Spleen: Within normal. Adrenals/Urinary Tract: Adrenal glands are normal. Kidneys are normal. Ureters and bladder are normal. Stomach/Bowel: Mild gastric distension. Previous appendectomy. There are multiple dilated fluid-filled small bowel loops measuring up to 4 cm in diameter. Few scattered air-fluid levels. No definite transition point identified although possibly within the right lower abdomen. Colon is decompressed from the mid transverse colon distally. Small amount of free fluid over the pelvis. No free peritoneal air. No pneumatosis. Vascular/Lymphatic: Within normal. Reproductive: Within normal. Other: Surgical sutures over the right abdominal wall likely due to prior hernia repair. Musculoskeletal: Within normal. IMPRESSION: Evidence of a mid to distal small bowel obstruction measuring up to 4 cm in diameter with possible  transition point over the right lower quadrant likely due to adhesions. Electronically Signed   By: Elberta Fortis M.D.   On: 12/04/2016 23:13   Dg Abd 2 Views  Result Date:  12/06/2016 CLINICAL DATA:  Constipation. EXAM: ABDOMEN - 2 VIEW COMPARISON:  Radiographs of December 05, 2016. FINDINGS: Distal tip of nasogastric tube is seen in proximal stomach. Mild amount of stool is seen in the right colon. Mildly dilated small bowel loops are seen in the left side of the abdomen which may represent ileus or distal small bowel obstruction. Surgical sutures are seen in lower abdomen. IMPRESSION: Distal tip of nasogastric tube seen in proximal stomach. Mild stool burden is noted. Mildly dilated small bowel loops are seen in left side of the abdomen which may represent ileus or improving distal small bowel obstruction. Electronically Signed   By: Lupita Raider, M.D.   On: 12/06/2016 10:07   Dg Abd 2 Views  Result Date: 12/05/2016 CLINICAL DATA:  46 year old male with shortness of breath nausea and vomiting. Small bowel obstruction on CT. EXAM: ABDOMEN - 2 VIEW COMPARISON:  CT Abdomen and Pelvis and KUB 12/04/2016. FINDINGS: Upright and supine views. NG tube in place, side hole up the level of the proximal stomach. Excreted IV contrast in the urinary bladder. Increased large bowel gas since the CT yesterday, particularly at the splenic flexure. However there are continued dilated mid abdominal small bowel loops up to 4 cm diameter. Postoperative changes again project over the lower abdomen. No pneumoperitoneum. Negative lung bases. No acute osseous abnormality identified. IMPRESSION: 1. Evidence of improved but probably not resolved small bowel obstruction since 12/04/2016. 2. No free air.  NG tube side hole within the stomach. Electronically Signed   By: Odessa Fleming M.D.   On: 12/05/2016 09:17    Anti-infectives: Anti-infectives    None      Assessment/Plan:  Labs are reviewed. Abdominal films are personally reviewed. There still are concerning bowel loops in the mid abdomen which have decreased in size slightly but are still present and there is not much bowel gas. We'll reexamine  later this afternoon.  [name], MD, FACS  12/06/2016

## 2016-12-06 NOTE — Op Note (Signed)
12/04/2016 - 12/06/2016  6:55 PM  PATIENT:  Chris Case  46 y.o. male  PRE-OPERATIVE DIAGNOSIS:  Small bowel obstruction  POST-OPERATIVE DIAGNOSIS:  Small bowel obstruction  PROCEDURE: Exploratory laparotomy and extensive adhesio lysis  SURGEON:  [name] MD, FACS  ANESTHESIA:   Gen. with endotracheal tube  Asst.: Surgical tech  Indications this patient with a small bowel obstruction that has been treated conservatively but has not improved and requires exploration. Preoperatively discussed rationale for surgery the options of observation risk bleeding infection recurrence bowel resection etc. he understood and agreed to proceed  Findings: Extensive adhesions the small bowel obstruction was the result of a strong ropey adhesion measuring approximately 8-10 mm across however there was extensive dense fibrous adhesions loop to loop and multiple bowel sections 2 abdominal wall etc. One enterotomy was made and repaired with silk. It measured approximately 2 mm. Multiple serosal tears were repaired by imbrication with silk sutures as well. Minimal spillage if any was noted.  Description of procedure: Patient was brought to the operating room and induced general anesthesia Foley catheter was placed nasogastric tube was in place and IV antibiotics were given. VT E prophylaxis was in place. He was then prepped and draped sterile fashion a surgical pause was held. A midline incision was utilized to open and explore the abdominal cavity. Dense adhesions were identified immediately and some of these were taken down it was very difficult to eviscerate the small bowel into the wound due to these multiple extensive adhesions. Ultimately a large ropey adhesion was identified and this was lysed with electrocautery but extensive adhesio lysis was performed from the ligament of Treitz to the ileocecal valve including especially the transverse colon and sigmoid colon and cecum. This was all done with sharp  dissection and minimal use of electrocautery.  A single enterotomy was made through the case and this was closed with interrupted imbricating Lembert type sutures of 3-0 silk. Multiple serosal tears were imbricated with 3-0 silk Lembert type sutures as well.  The bowel was run at least 4 or 5 times from the ligament of Treitz to the ileocecal valve and back to ensure that there were no other missed enterotomies or serosal tears and that adhesions were adequately lysed. The placement of nasogastric tube was confirmed. Hemostasis was maintained in adequate at this point. The sponge lap needle count was correct. The wound was then closed by placing a piece of Seprafilm followed by running #1 PDS. Marcaine was infiltrated into the skin and subcutaneous tissues tissues for a total of 30 cc. The wound was then closed with skin staples. A sterile dressing was placed.  Patient hour this procedure well the workup occasions other than the enterotomies as described which were repaired without difficulty. He was taken the recovery room in stable condition to be admitted for continued care sponge lap needle count was correct the estimated blood loss was 50 cc.      [name], MD FACS

## 2016-12-06 NOTE — Progress Notes (Signed)
Preoperative Review   Patient is met in the preoperative holding area. The history is reviewed in the chart and with the patient. I personally reviewed the options and rationale as well as the risks of this procedure that have been previously discussed with the patient. All questions asked by the patient and/or family were answered to their satisfaction.  Patient agrees to proceed with this procedure at this time.  Chris Case E Lissandra Keil M.D. FACS  

## 2016-12-06 NOTE — Anesthesia Procedure Notes (Signed)
Procedure Name: Intubation Date/Time: 12/06/2016 5:18 PM Performed by: Dionne Bucy Pre-anesthesia Checklist: Patient identified, Patient being monitored, Timeout performed, Emergency Drugs available and Suction available Patient Re-evaluated:Patient Re-evaluated prior to induction Oxygen Delivery Method: Circle system utilized Preoxygenation: Pre-oxygenation with 100% oxygen Induction Type: IV induction, Rapid sequence and Cricoid Pressure applied Laryngoscope Size: Mac and 3 Grade View: Grade I Tube type: Oral Tube size: 7.5 mm Number of attempts: 1 Airway Equipment and Method: Stylet Placement Confirmation: ETT inserted through vocal cords under direct vision,  positive ETCO2 and breath sounds checked- equal and bilateral Secured at: 23 cm Tube secured with: Tape Dental Injury: Teeth and Oropharynx as per pre-operative assessment  Comments: NG tube in place and to suction during RSI

## 2016-12-06 NOTE — Transfer of Care (Signed)
Immediate Anesthesia Transfer of Care Note  Patient: Chris Case  Procedure(s) Performed: EXPLORATORY LAPAROTOMY- SBO (N/A Abdomen)  Patient Location: PACU  Anesthesia Type:General  Level of Consciousness: awake  Airway & Oxygen Therapy: Patient connected to face mask oxygen  Post-op Assessment: Post -op Vital signs reviewed and stable  Post vital signs: stable  Last Vitals:  Vitals:   12/06/16 1150 12/06/16 1902  BP: 127/80 139/85  Pulse: 71 65  Resp:  16  Temp: 37.1 C (!) 36.3 C  SpO2: 99% 100%    Last Pain:  Vitals:   12/06/16 1150  TempSrc: Oral  PainSc:       Patients Stated Pain Goal: 0 (12/05/16 2338)  Complications: No apparent anesthesia complications

## 2016-12-06 NOTE — Anesthesia Post-op Follow-up Note (Signed)
Anesthesia QCDR form completed.        

## 2016-12-06 NOTE — Progress Notes (Signed)
Visited patient after reviewing the abdominal films from today. Patient states he still has pain has passed a small amount of gas.  Abdomen remains distended tympanitic minimally tender no peritoneal signs  KUB is personally reviewed.  With the above findings I'm suggesting exploration. The patient is given the option of waiting another 24 hours but he has failed to improve considerably and would benefit from exploration. The rationale for this was discussed the options of observation reviewed the risk bleeding infection bowel resection and recurrent obstruction were all discussed with him he understood and agreed to proceed no family was present.

## 2016-12-06 NOTE — Anesthesia Postprocedure Evaluation (Signed)
Anesthesia Post Note  Patient: Chris Case  Procedure(s) Performed: EXPLORATORY LAPAROTOMY- SBO (N/A Abdomen)  Patient location during evaluation: PACU Anesthesia Type: General Level of consciousness: awake and alert Pain management: pain level controlled Vital Signs Assessment: post-procedure vital signs reviewed and stable Respiratory status: spontaneous breathing, nonlabored ventilation, respiratory function stable and patient connected to nasal cannula oxygen Cardiovascular status: blood pressure returned to baseline and stable Postop Assessment: no apparent nausea or vomiting Anesthetic complications: no     Last Vitals:  Vitals:   12/06/16 2002 12/06/16 2015  BP: 131/90 131/76  Pulse: 75   Resp: 18   Temp:  37.1 C  SpO2: 99% 99%    Last Pain:  Vitals:   12/06/16 2015  TempSrc:   PainSc: 4                  Lenard Simmer

## 2016-12-07 ENCOUNTER — Encounter: Payer: Self-pay | Admitting: Surgery

## 2016-12-07 LAB — CBC WITH DIFFERENTIAL/PLATELET
Basophils Absolute: 0 10*3/uL (ref 0–0.1)
Basophils Relative: 0 %
EOS PCT: 0 %
Eosinophils Absolute: 0 10*3/uL (ref 0–0.7)
HEMATOCRIT: 41.2 % (ref 40.0–52.0)
HEMOGLOBIN: 13.7 g/dL (ref 13.0–18.0)
LYMPHS ABS: 1.1 10*3/uL (ref 1.0–3.6)
LYMPHS PCT: 7 %
MCH: 31.5 pg (ref 26.0–34.0)
MCHC: 33.3 g/dL (ref 32.0–36.0)
MCV: 94.5 fL (ref 80.0–100.0)
Monocytes Absolute: 1.2 10*3/uL — ABNORMAL HIGH (ref 0.2–1.0)
Monocytes Relative: 8 %
Neutro Abs: 13.1 10*3/uL — ABNORMAL HIGH (ref 1.4–6.5)
Neutrophils Relative %: 85 %
PLATELETS: 110 10*3/uL — AB (ref 150–440)
RBC: 4.36 MIL/uL — AB (ref 4.40–5.90)
RDW: 13.3 % (ref 11.5–14.5)
WBC: 15.4 10*3/uL — ABNORMAL HIGH (ref 3.8–10.6)

## 2016-12-07 LAB — BASIC METABOLIC PANEL
ANION GAP: 5 (ref 5–15)
BUN: 9 mg/dL (ref 6–20)
CHLORIDE: 97 mmol/L — AB (ref 101–111)
CO2: 33 mmol/L — ABNORMAL HIGH (ref 22–32)
Calcium: 8.1 mg/dL — ABNORMAL LOW (ref 8.9–10.3)
Creatinine, Ser: 0.74 mg/dL (ref 0.61–1.24)
GFR calc Af Amer: >60 mL/min (ref >60)
GLUCOSE: 129 mg/dL — AB (ref 65–99)
POTASSIUM: 4.1 mmol/L (ref 3.5–5.1)
Sodium: 135 mmol/L (ref 135–145)

## 2016-12-07 NOTE — Progress Notes (Signed)
1 Day Post-Op  Subjective: Status post exploratory laparotomy and adhesiolysis for bowel obstruction. Patient feels well today but having considerable incisional pain. No nausea or vomiting  Objective: Vital signs in last 24 hours: Temp:  [97.3 F (36.3 C)-99.1 F (37.3 C)] 98.2 F (36.8 C) (10/06 0954) Pulse Rate:  [63-85] 85 (10/06 0954) Resp:  [11-18] 16 (10/06 0954) BP: (106-151)/(65-91) 118/78 (10/06 0954) SpO2:  [92 %-100 %] 97 % (10/06 0954) Last BM Date: 12/04/16  Intake/Output from previous day: 10/05 0701 - 10/06 0700 In: 2763.7 [I.V.:2763.7] Out: 1715 [Urine:1100; Emesis/NG output:515; Blood:100] Intake/Output this shift: Total I/O In: 595.8 [I.V.:595.8] Out: 400 [Urine:400]  Physical exam:  Wound is clean no erythema no drainage distended abdomen tympanitic  Lab Results: CBC   Recent Labs  12/06/16 0428 12/07/16 0520  WBC 10.0 15.4*  HGB 12.7* 13.7  HCT 38.0* 41.2  PLT 111* 110*   BMET  Recent Labs  12/06/16 0428 12/07/16 0520  NA 136 135  K 3.6 4.1  CL 97* 97*  CO2 32 33*  GLUCOSE 109* 129*  BUN 8 9  CREATININE 0.78 0.74  CALCIUM 8.4* 8.1*   PT/INR No results for input(s): LABPROT, INR in the last 72 hours. ABG No results for input(s): PHART, HCO3 in the last 72 hours.  Invalid input(s): PCO2, PO2  Studies/Results: Dg Abd 2 Views  Result Date: 12/06/2016 CLINICAL DATA:  Constipation. EXAM: ABDOMEN - 2 VIEW COMPARISON:  Radiographs of December 05, 2016. FINDINGS: Distal tip of nasogastric tube is seen in proximal stomach. Mild amount of stool is seen in the right colon. Mildly dilated small bowel loops are seen in the left side of the abdomen which may represent ileus or distal small bowel obstruction. Surgical sutures are seen in lower abdomen. IMPRESSION: Distal tip of nasogastric tube seen in proximal stomach. Mild stool burden is noted. Mildly dilated small bowel loops are seen in left side of the abdomen which may represent ileus or  improving distal small bowel obstruction. Electronically Signed   By: Lupita Raider, M.D.   On: 12/06/2016 10:07    Anti-infectives: Anti-infectives    Start     Dose/Rate Route Frequency Ordered Stop   12/06/16 1615  cefoTEtan (CEFOTAN) 2 g in dextrose 5 % 50 mL IVPB     2 g 100 mL/hr over 30 Minutes Intravenous  Once 12/06/16 1608 12/06/16 1751      Assessment/Plan: s/p Procedure(s): EXPLORATORY LAPAROTOMY- SBO   Labs reviewed. Patient doing well at this point continue nasogastric suction but will discontinue Foley catheter. Ambulate in the hall.  [name], MD, FACS  12/07/2016

## 2016-12-08 LAB — BASIC METABOLIC PANEL
Anion gap: 7 (ref 5–15)
BUN: 10 mg/dL (ref 6–20)
CO2: 34 mmol/L — ABNORMAL HIGH (ref 22–32)
Calcium: 8.3 mg/dL — ABNORMAL LOW (ref 8.9–10.3)
Chloride: 91 mmol/L — ABNORMAL LOW (ref 101–111)
Creatinine, Ser: 0.95 mg/dL (ref 0.61–1.24)
GFR calc Af Amer: >60 mL/min (ref >60)
Glucose, Bld: 128 mg/dL — ABNORMAL HIGH (ref 65–99)
POTASSIUM: 4.1 mmol/L (ref 3.5–5.1)
Sodium: 132 mmol/L — ABNORMAL LOW (ref 135–145)

## 2016-12-08 LAB — CBC WITH DIFFERENTIAL/PLATELET
Basophils Absolute: 0 10*3/uL (ref 0–0.1)
Basophils Relative: 0 %
EOS PCT: 0 %
Eosinophils Absolute: 0 10*3/uL (ref 0–0.7)
HCT: 40.6 % (ref 40.0–52.0)
Hemoglobin: 13.6 g/dL (ref 13.0–18.0)
LYMPHS ABS: 1.3 10*3/uL (ref 1.0–3.6)
LYMPHS PCT: 7 %
MCH: 31.7 pg (ref 26.0–34.0)
MCHC: 33.4 g/dL (ref 32.0–36.0)
MCV: 94.8 fL (ref 80.0–100.0)
MONO ABS: 1.6 10*3/uL — AB (ref 0.2–1.0)
Monocytes Relative: 8 %
Neutro Abs: 16.4 10*3/uL — ABNORMAL HIGH (ref 1.4–6.5)
Neutrophils Relative %: 85 %
PLATELETS: 111 10*3/uL — AB (ref 150–440)
RBC: 4.28 MIL/uL — AB (ref 4.40–5.90)
RDW: 13.5 % (ref 11.5–14.5)
WBC: 19.4 10*3/uL — ABNORMAL HIGH (ref 3.8–10.6)

## 2016-12-08 NOTE — Plan of Care (Signed)
Problem: Activity: Goal: Risk for activity intolerance will decrease Outcome: Not Progressing Patient tachypneic / dyspneic with exertion. Patient educated to take breaks when moving around and to focus on breathing. O2 sats drop in 80s with exertion. Heart rate increases into 120's with exertion. Once patient gets settled, respirations and heart rate improve to baseline.

## 2016-12-08 NOTE — Progress Notes (Signed)
2 Days Post-Op  Subjective: Status post exploratory laparoscopy and adhesiolyse is for small bowel obstruction. Patient has yet passed gas area and he is feeling better today however he has less pain no nausea or vomiting. Nasogastric tube is still in place.  Objective: Vital signs in last 24 hours: Temp:  [97.5 F (36.4 C)-98.4 F (36.9 C)] 98.3 F (36.8 C) (10/07 0441) Pulse Rate:  [85-114] 114 (10/07 0441) Resp:  [15-20] 20 (10/07 0812) BP: (105-125)/(73-84) 121/73 (10/07 0441) SpO2:  [92 %-98 %] 92 % (10/07 0812) Last BM Date: 12/04/16  Intake/Output from previous day: 10/06 0701 - 10/07 0700 In: 3445.8 [P.O.:100; I.V.:3095.8; NG/GT:250] Out: 1045 [Urine:745; Emesis/NG output:300] Intake/Output this shift: No intake/output data recorded.  Physical exam:  Distended and tympanitic quite tense abdomen as it was preoperatively. Wound is clean no erythema NG in place  Lab Results: CBC   Recent Labs  12/07/16 0520 12/08/16 0441  WBC 15.4* 19.4*  HGB 13.7 13.6  HCT 41.2 40.6  PLT 110* 111*   BMET  Recent Labs  12/07/16 0520 12/08/16 0441  NA 135 132*  K 4.1 4.1  CL 97* 91*  CO2 33* 34*  GLUCOSE 129* 128*  BUN 9 10  CREATININE 0.74 0.95  CALCIUM 8.1* 8.3*   PT/INR No results for input(s): LABPROT, INR in the last 72 hours. ABG No results for input(s): PHART, HCO3 in the last 72 hours.  Invalid input(s): PCO2, PO2  Studies/Results: Dg Abd 2 Views  Result Date: 12/06/2016 CLINICAL DATA:  Constipation. EXAM: ABDOMEN - 2 VIEW COMPARISON:  Radiographs of December 05, 2016. FINDINGS: Distal tip of nasogastric tube is seen in proximal stomach. Mild amount of stool is seen in the right colon. Mildly dilated small bowel loops are seen in the left side of the abdomen which may represent ileus or distal small bowel obstruction. Surgical sutures are seen in lower abdomen. IMPRESSION: Distal tip of nasogastric tube seen in proximal stomach. Mild stool burden is noted.  Mildly dilated small bowel loops are seen in left side of the abdomen which may represent ileus or improving distal small bowel obstruction. Electronically Signed   By: Lupita Raider, M.D.   On: 12/06/2016 10:07    Anti-infectives: Anti-infectives    Start     Dose/Rate Route Frequency Ordered Stop   12/06/16 1615  cefoTEtan (CEFOTAN) 2 g in dextrose 5 % 50 mL IVPB     2 g 100 mL/hr over 30 Minutes Intravenous  Once 12/06/16 1608 12/06/16 1751      Assessment/Plan: s/p Procedure(s): EXPLORATORY LAPAROTOMY- SBO   Await bowel function in a patient with extensive adhesio lysis. Continue NG tube and ice chips only.  [name], MD, FACS  12/08/2016

## 2016-12-09 LAB — CBC WITH DIFFERENTIAL/PLATELET
Basophils Absolute: 0 10*3/uL (ref 0–0.1)
Basophils Relative: 0 %
EOS ABS: 0.2 10*3/uL (ref 0–0.7)
Eosinophils Relative: 1 %
HEMATOCRIT: 36.1 % — AB (ref 40.0–52.0)
HEMOGLOBIN: 12 g/dL — AB (ref 13.0–18.0)
LYMPHS ABS: 1.1 10*3/uL (ref 1.0–3.6)
Lymphocytes Relative: 6 %
MCH: 31.7 pg (ref 26.0–34.0)
MCHC: 33.3 g/dL (ref 32.0–36.0)
MCV: 95 fL (ref 80.0–100.0)
MONOS PCT: 9 %
Monocytes Absolute: 1.7 10*3/uL — ABNORMAL HIGH (ref 0.2–1.0)
NEUTROS ABS: 15.8 10*3/uL — AB (ref 1.4–6.5)
NEUTROS PCT: 84 %
Platelets: 107 10*3/uL — ABNORMAL LOW (ref 150–440)
RBC: 3.8 MIL/uL — AB (ref 4.40–5.90)
RDW: 13.5 % (ref 11.5–14.5)
WBC: 18.8 10*3/uL — AB (ref 3.8–10.6)

## 2016-12-09 LAB — BASIC METABOLIC PANEL
Anion gap: 6 (ref 5–15)
BUN: 7 mg/dL (ref 6–20)
CHLORIDE: 95 mmol/L — AB (ref 101–111)
CO2: 34 mmol/L — AB (ref 22–32)
Calcium: 8.3 mg/dL — ABNORMAL LOW (ref 8.9–10.3)
Creatinine, Ser: 0.71 mg/dL (ref 0.61–1.24)
GFR calc non Af Amer: >60 mL/min (ref >60)
Glucose, Bld: 120 mg/dL — ABNORMAL HIGH (ref 65–99)
POTASSIUM: 4.2 mmol/L (ref 3.5–5.1)
SODIUM: 135 mmol/L (ref 135–145)

## 2016-12-09 NOTE — Progress Notes (Signed)
SURGICAL PROGRESS NOTE  Hospital Day(s): 4.   Post op day(s): 3 Days Post-Op.   Interval History: Patient seen and examined, no acute events or new complaints overnight. Patient reports peri-incisional abdominal pain that he thinks seems worse when he presses his PCA, denies flatus, N/V, fever/chills, CP, or SOB.  Review of Systems:  Constitutional: denies fever, chills  HEENT: denies cough or congestion  Respiratory: denies any shortness of breath  Cardiovascular: denies chest pain or palpitations  Gastrointestinal: abdominal pain, N/V, and bowel function as per interval history Genitourinary: denies burning with urination or urinary frequency Musculoskeletal: denies pain, decreased motor or sensation Integumentary: denies any other rashes or skin discolorations except post-surgical abdominal wounds Neurological: denies HA or vision/hearing changes   Vital signs in last 24 hours: [min-max] current  Temp:  [98 F (36.7 C)-99 F (37.2 C)] 98.3 F (36.8 C) (10/08 0525) Pulse Rate:  [96-105] 105 (10/08 0525) Resp:  [12-22] 18 (10/08 0748) BP: (114-130)/(68-83) 123/83 (10/08 0525) SpO2:  [92 %-100 %] 96 % (10/08 0748)     Height:  (167.6 cm) Weight: 181 lb 9.6 oz (82.4 kg) BMI (Calculated): 29.32   Intake/Output this shift:  No intake/output data recorded.   Intake/Output last 2 shifts:  @   Physical Exam:  Constitutional: alert, cooperative and no distress  HENT: normocephalic without obvious abnormality  Eyes: PERRL, EOM's grossly intact and symmetric  Neuro: CN II - XII grossly intact and symmetric without deficit  Respiratory: breathing non-labored at rest  Cardiovascular: regular rate and sinus rhythm  Gastrointestinal: soft and non-distended with mild peri-incisional tenderness to palpation, no erythema, NG tube draining bilious drainage Musculoskeletal: UE and LE FROM, no edema or wounds, motor and sensation grossly intact, NT   Labs:  CBC  Latest Ref Rng & Units 12/09/2016 12/08/2016 12/07/2016  WBC 3.8 - 10.6 K/uL 18.8(H) 19.4(H) 15.4(H)  Hemoglobin 13.0 - 18.0 g/dL 12.0(L) 13.6 13.7  Hematocrit 40.0 - 52.0 % 36.1(L) 40.6 41.2  Platelets 150 - 440 K/uL 107(L) 111(L) 110(L)   CMP Latest Ref Rng & Units 12/09/2016 12/08/2016 12/07/2016  Glucose 65 - 99 mg/dL 161(W) 960(A) 540(J)  BUN 6 - 20 mg/dL Creatinine 0.61 - 1.24 mg/dL 8.11 9.14 7.82  Sodium 135 - 145 mmol/L 135 132(L) 135  Potassium 3.5 - 5.1 mmol/L 4.2 4.1 4.1  Chloride 101 - 111 mmol/L 95(L) 91(L) 97(L)  CO2 22 - 32 mmol/L 34(H) 34(H) 33(H)  Calcium 8.9 - 10.3 mg/dL 8.3(L) 8.3(L) 8.1(L)  Total Protein 6.5 - 8.1 g/dL - - -  Total Bilirubin 0.3 - 1.2 mg/dL - - -  Alkaline Phos 38 - 126 U/L - - -  AST 15 - 41 U/L - - -  ALT 17 - 63 U/L - - -   Imaging studies: No new pertinent imaging studies   Assessment/Plan: (ICD-10's: K55.52) 46 y.o. 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  - NPO, IVF  - pain control prn, minimize narcotics - NG tube for nasogastric decompression - monitor ongoing bowel function and abdominal exam  - continue home O2 via [place] and nebulizers prn - ambulation encouraged - DVT prophylaxis  All of the above findings and recommendations were discussed with the  patient, and all of patient's questions were answered to his expressed satisfaction.  -- Scherrie Gerlach Earlene Plater, MD, RPVI [place]: [facility] - Partnering for exceptional care. Office: [phone]

## 2016-12-09 NOTE — Progress Notes (Signed)
Patient was very short of breath and took several minutes to recover after ambulating

## 2016-12-09 NOTE — Progress Notes (Signed)
Patient requiring very little PCA.  He said it hurts when the morphine first enters his system but then the patient is better.  Voiding adequately in the urinal.  Chronic Oxygen at 2-3 liters

## 2016-12-10 DIAGNOSIS — E43 Unspecified severe protein-calorie malnutrition: Secondary | ICD-10-CM | POA: Insufficient documentation

## 2016-12-10 MED ORDER — KETOROLAC TROMETHAMINE 30 MG/ML IJ SOLN
30.0000 mg | Freq: Four times a day (QID) | INTRAMUSCULAR | Status: AC
  Administered 2016-12-11 – 2016-12-14 (×14): 30 mg via INTRAVENOUS
  Filled 2016-12-10 (×14): qty 1

## 2016-12-10 MED ORDER — MORPHINE SULFATE (PF) 2 MG/ML IV SOLN: 2.0000 mg | INTRAVENOUS | Status: DC | PRN

## 2016-12-10 NOTE — Progress Notes (Signed)
Chris Case, Chris Case, postop day 4 of  exploratory laparoscopy and adhesiolyse is for small bowel obstruction. Patient on PCA, NG tube and maintenance IV fluid in place. Patient remained on chronic 3L of supplemental oxygen. Patient was was unable to complete full ambulation on the unit d/t O2sat dropping into low 60s. Patient was assisted back into his room.

## 2016-12-10 NOTE — Progress Notes (Signed)
SURGICAL PROGRESS NOTE  Hospital Day(s): 5.   Post op day(s): 4 Days Post-Op.   Interval History: Patient seen and examined, no acute events or new complaints overnight. Patient reports abdominal "rumbling" with decreased peri-incisional abdominal pain, denies N/V, fever/chills, CP, or SOB, though ambulation was aborted due to asymptomatic desaturation while ambulating with RN. Patient also reports he hasn't been using PCA at all due to belief it causes him pain.  Review of Systems:  Constitutional: denies fever, chills  HEENT: denies cough or congestion  Respiratory: denies any shortness of breath  Cardiovascular: denies chest pain or palpitations  Gastrointestinal: abdominal pain, N/V, and bowel function as per interval history Genitourinary: denies burning with urination or urinary frequency Musculoskeletal: denies pain, decreased motor or sensation Integumentary: denies any other rashes or skin discolorations except post-surgical abdominal wound Neurological: denies HA or vision/hearing changes   Vital signs in last 24 hours: [min-max] current  Temp:  [98.3 F (36.8 C)-100.1 F (37.8 C)] 99.1 F (37.3 C) (10/09 0459) Pulse Rate:  [89-102] 89 (10/09 0459) Resp:  [12-20] 18 (10/09 0822) BP: (109-116)/(69-82) 109/79 (10/09 0459) SpO2:  [94 %-100 %] 97 % (10/09 0822) FiO2 (%):  [96 %] 96 % (10/09 0822)     Height:  (167.6 cm) Weight: 181 lb 9.6 oz (82.4 kg) BMI (Calculated): 29.32   Intake/Output this shift:  Total I/O In: -  Out: 200 [Urine:200]   Intake/Output last 2 shifts:  @   Physical Exam:  Constitutional: alert, cooperative and no distress  HENT: normocephalic without obvious abnormality  Eyes: PERRL, EOM's grossly intact and symmetric  Neuro: CN II - XII grossly intact and symmetric without deficit  Respiratory: breathing non-labored at rest  Cardiovascular: regular rate and sinus rhythm  Gastrointestinal: soft and non-distended with mild  peri-incisional tenderness to palpation, incision well-approximated without erythema or drainage Musculoskeletal: UE and LE FROM, no edema or wounds, motor and sensation grossly intact, NT   Labs:  CBC Latest Ref Rng & Units 12/09/2016 12/08/2016 12/07/2016  WBC 3.8 - 10.6 K/uL 18.8(H) 19.4(H) 15.4(H)  Hemoglobin 13.0 - 18.0 g/dL 12.0(L) 13.6 13.7  Hematocrit 40.0 - 52.0 % 36.1(L) 40.6 41.2  Platelets 150 - 440 K/uL 107(L) 111(L) 110(L)   CMP Latest Ref Rng & Units 12/09/2016 12/08/2016 12/07/2016  Glucose 65 - 99 mg/dL 409(W) 119(J) 478(G)  BUN 6 - 20 mg/dL Creatinine 0.61 - 1.24 mg/dL 9.56 2.13 0.86  Sodium 135 - 145 mmol/L 135 132(L) 135  Potassium 3.5 - 5.1 mmol/L 4.2 4.1 4.1  Chloride 101 - 111 mmol/L 95(L) 91(L) 97(L)  CO2 22 - 32 mmol/L 34(H) 34(H) 33(H)  Calcium 8.9 - 10.3 mg/dL 8.3(L) 8.3(L) 8.1(L)  Total Protein 6.5 - 8.1 g/dL - - -  Total Bilirubin 0.3 - 1.2 mg/dL - - -  Alkaline Phos 38 - 126 U/L - - -  AST 15 - 41 U/L - - -  ALT 17 - 63 U/L - - -   Imaging studies: No new pertinent imaging studies   Assessment/Plan: (ICD-10's: K64.52) 46 y.o. 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  - NPO, IVF             - pain control  prn, minimize narcotics - NG tube for nasogastric decompression - monitor ongoing bowel function and abdominal exam  - anticipate recovery of bowel function soon, will hold off on TPN for now - continue home O2 via [place] and nebulizers prn  - switch PCA to prn pain medication  - follow up morning WBC - ambulation encouraged - DVT  prophylaxis  All of the above findings and recommendations were discussed with the patient, and all of patient's questions were answered to his expressed satisfaction.  -- Scherrie Gerlach Earlene Plater, MD, RPVI [place]: [facility] - Partnering for exceptional care. Office: [phone]

## 2016-12-10 NOTE — Progress Notes (Signed)
Initial Nutrition Assessment  DOCUMENTATION CODES:   Severe malnutrition in context of acute illness/injury  INTERVENTION:   Recommend TPN as pt without adequate nutrition for > 7 days  If TPN initiated recommend Clinimix 5/15 with electrolytes at 37m/hr + 20% lipids _0 /hr x 12 hrs/day (Goal rate 812mhr once labs stable)  Regimen provides 1178kcal/day, 49g/day protein, 122442molume  Add MVI daily   Add IV thiamine 100m72mily   Pt at high refeeding risk; recommend check P, K, and Mg daily for 3 days after TPN iniatition.   Daily weights  Recommend discontinue IVF  NUTRITION DIAGNOSIS:   Malnutrition (severe) related to acute illness (SBO) as evidenced by energy intake < or equal to 50% for > or equal to 5 days, moderate depletions of muscle mass.  GOAL:   Patient will meet greater than or equal to 90% of their needs  MONITOR:   Diet advancement, Labs, Weight trends, I & O's  REASON FOR ASSESSMENT:   NPO/Clear Liquid Diet    ASSESSMENT:   45 y54. 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   Pt s/p exploratory laparotomy with lysis of adhesions for non-resolving complete small bowel obstruction on 10/5  Met with pt in room today, pt reports poor appetite and oral intake that started 5 days pta. Pt reports that everything he ate, would come back up. Pt has been NPO since admit and is now without adequate nutrition for > 7 days. Recommend TPN initiation; pt at high refeeding risk. Pt reports his UBW is 145lbs; RD believes last weight taken is incorrect. Pt weighed 153lbs in march; RD is unsure if any true weight loss and pt denies wt loss. Pt with NGT in place sitting on side of bed at time of RD visit. Pt denies any flatus or BMs. Pt reports  that his abdomen is feeling less distended since NGT placement.   Medications reviewed and include: lovenox, hydromorphone, protonix, NaCl w/ KCl and 5% dextrose _1 /hr  Labs reviewed: Cl 95(L), CO2 34(H), Ca 8.3(L) Wbc- 18.8(H)  Nutrition-Focused physical exam completed. Findings are no fat depletion, moderate muscle depletions in clavicles and LE, and no edema.   Diet Order:  Diet NPO time specified  Skin:  Wound (see comment) (incision abdomen )  Last BM:  10/3  Height:   Ht Readings from Last 1 Encounters:  12/05/16 _2  (1.676 m)    Weight:   Wt Readings from Last 1 Encounters:  12/05/16 181 lb 9.6 oz (82.4 kg)    Ideal Body Weight:  64.5 kg  BMI:  Body mass index is 29.31 kg/m.  Estimated Nutritional Needs:   Kcal:  1800-2100kcal/day   Protein:  86-99g/day   Fluid:  >1.8L/day   EDUCATION NEEDS:   Education needs addressed  CaseKoleen Distance RD, LDN Pager #- [phone] Hours Pager: [phone]

## 2016-12-11 LAB — BASIC METABOLIC PANEL
ANION GAP: 7 (ref 5–15)
BUN: 7 mg/dL (ref 6–20)
CALCIUM: 8.6 mg/dL — AB (ref 8.9–10.3)
CHLORIDE: 97 mmol/L — AB (ref 101–111)
CO2: 35 mmol/L — AB (ref 22–32)
Creatinine, Ser: 0.71 mg/dL (ref 0.61–1.24)
GFR calc non Af Amer: >60 mL/min (ref >60)
Glucose, Bld: 106 mg/dL — ABNORMAL HIGH (ref 65–99)
Potassium: 4 mmol/L (ref 3.5–5.1)
SODIUM: 139 mmol/L (ref 135–145)

## 2016-12-11 LAB — CBC
HCT: 35.9 % — ABNORMAL LOW (ref 40.0–52.0)
HEMOGLOBIN: 12.1 g/dL — AB (ref 13.0–18.0)
MCH: 32 pg (ref 26.0–34.0)
MCHC: 33.6 g/dL (ref 32.0–36.0)
MCV: 95.4 fL (ref 80.0–100.0)
Platelets: 167 10*3/uL (ref 150–440)
RBC: 3.77 MIL/uL — AB (ref 4.40–5.90)
RDW: 13.2 % (ref 11.5–14.5)
WBC: 9.8 10*3/uL (ref 3.8–10.6)

## 2016-12-11 NOTE — Progress Notes (Signed)
SURGICAL PROGRESS NOTE  Hospital Day(s): 6.   Post op day(s): 5 Days Post-Op.   Interval History: Patient seen and examined, no acute events or new complaints overnight. Patient reports a single small flatus and GI "rumbling", but denies any further flatus, N/V, worsened peri-incisional abdominal pain, fever/chills, CP, or SOB on O2 via [place]. Patient also states he wants to walk, but thinks the RN's are "scared" because his O2 saturation dropped to 60's while ambulating in his room.  Review of Systems:  Constitutional: denies fever, chills  HEENT: denies cough or congestion  Respiratory: denies any shortness of breath  Cardiovascular: denies chest pain or palpitations  Gastrointestinal: abdominal pain, N/V, and bowel function as per interval history Genitourinary: denies burning with urination or urinary frequency Musculoskeletal: denies pain, decreased motor or sensation Integumentary: denies any other rashes or skin discolorations except post-surgical abdominal wound Neurological: denies HA or vision/hearing changes   Vital signs in last 24 hours: [min-max] current  Temp:  [97.5 F (36.4 C)-98.7 F (37.1 C)] 98.3 F (36.8 C) (10/10 0420) Pulse Rate:  [71-87] 85 (10/10 0420) Resp:  [16-20] 16 (10/10 0420) BP: (103-125)/(60-79) 108/75 (10/10 0420) SpO2:  [97 %-100 %] 100 % (10/10 0420) FiO2 (%):  [96 %-98 %] 98 % (10/09 2031)     Height:  (167.6 cm) Weight: 181 lb 9.6 oz (82.4 kg) BMI (Calculated): 29.32   Intake/Output this shift:  No intake/output data recorded.   Intake/Output last 2 shifts:  @   Physical Exam:  Constitutional: alert, cooperative and no distress  HENT: normocephalic without obvious abnormality  Eyes: PERRL, EOM's grossly intact and symmetric  Neuro: CN II - XII grossly intact and symmetric without deficit  Respiratory: breathing non-labored at rest  Cardiovascular: regular rate and sinus rhythm  Gastrointestinal: soft and  non-distended Musculoskeletal: UE and LE FROM, no edema or wounds, motor and sensation grossly intact, NT   Labs:  CBC Latest Ref Rng & Units 12/11/2016 12/09/2016 12/08/2016  WBC 3.8 - 10.6 K/uL 9.8 18.8(H) 19.4(H)  Hemoglobin 13.0 - 18.0 g/dL 12.1(L) 12.0(L) 13.6  Hematocrit 40.0 - 52.0 % 35.9(L) 36.1(L) 40.6  Platelets 150 - 440 K/uL 167 107(L) 111(L)   CMP Latest Ref Rng & Units 12/11/2016 12/09/2016 12/08/2016  Glucose 65 - 99 mg/dL 161(W) 960(A) 540(J)  BUN 6 - 20 mg/dL Creatinine 0.61 - 1.24 mg/dL 8.11 9.14 7.82  Sodium 135 - 145 mmol/L 139 135 132(L)  Potassium 3.5 - 5.1 mmol/L 4.0 4.2 4.1  Chloride 101 - 111 mmol/L 97(L) 95(L) 91(L)  CO2 22 - 32 mmol/L 35(H) 34(H) 34(H)  Calcium 8.9 - 10.3 mg/dL 9.5(A) 8.3(L) 8.3(L)  Total Protein 6.5 - 8.1 g/dL - - -  Total Bilirubin 0.3 - 1.2 mg/dL - - -  Alkaline Phos 38 - 126 U/L - - -  AST 15 - 41 U/L - - -  ALT 17 - 63 U/L - - -   Imaging studies: No new pertinent imaging studies   Assessment/Plan:(ICD-10's: K56.52) 46 y.o.malewith post-operative ileus with improving leukocytosis 5 Days Post-Ops/p exploratory laparotomy with lysis of adhesionsfor non-resolving complete small bowel obstruction, likely attributable to post-surgical adhesions following remote open appendectomy, complicated by relative hypokalemia and by pertinent comorbidities including COPD on home O2 for chronic hypoxia and 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  - NPO, IVF - pain control prn, minimize narcotics - NG tube for nasogastric decompression - monitor ongoing bowel function and abdominal exam             -  anticipate recovery of bowel function soon, will hold off on TPN for now - continue home O2 via  and nebulizers prn             - follow up morning WBC - ambulation encouraged - DVT prophylaxis  All of the above findings and recommendations were discussed with the  patient, and all of patient's questions were answered to his expressed satisfaction.  -- Scherrie Gerlach Earlene Plater, MD, RPVI [place]: [facility] - Partnering for exceptional care. Office: [phone]

## 2016-12-11 NOTE — Progress Notes (Signed)
Order received to dc PCA; wasted 5ml with Barney Drain, RN

## 2016-12-12 LAB — CBC
HEMATOCRIT: 34.3 % — AB (ref 40.0–52.0)
HEMOGLOBIN: 11.5 g/dL — AB (ref 13.0–18.0)
MCH: 32.4 pg (ref 26.0–34.0)
MCHC: 33.6 g/dL (ref 32.0–36.0)
MCV: 96.5 fL (ref 80.0–100.0)
Platelets: 166 10*3/uL (ref 150–440)
RBC: 3.55 MIL/uL — ABNORMAL LOW (ref 4.40–5.90)
RDW: 12.9 % (ref 11.5–14.5)
WBC: 8.8 10*3/uL (ref 3.8–10.6)

## 2016-12-12 LAB — BASIC METABOLIC PANEL
Anion gap: 6 (ref 5–15)
BUN: 11 mg/dL (ref 6–20)
CHLORIDE: 97 mmol/L — AB (ref 101–111)
CO2: 35 mmol/L — AB (ref 22–32)
Calcium: 8.5 mg/dL — ABNORMAL LOW (ref 8.9–10.3)
Creatinine, Ser: 0.84 mg/dL (ref 0.61–1.24)
GLUCOSE: 104 mg/dL — AB (ref 65–99)
Potassium: 4.2 mmol/L (ref 3.5–5.1)
SODIUM: 138 mmol/L (ref 135–145)

## 2016-12-12 LAB — GLUCOSE, CAPILLARY: Glucose-Capillary: 92 mg/dL (ref 65–99)

## 2016-12-12 NOTE — Progress Notes (Signed)
6 Days Post-Op  Subjective: Patient has not yet passed flatus but feels that he is about 2. He has felt some rumbling and his anticipating it happening soon. He has no nausea or vomiting.  Objective: Vital signs in last 24 hours: Temp:  [97.7 F (36.5 C)-99.2 F (37.3 C)] 99.2 F (37.3 C) (10/11 0440) Pulse Rate:  [83-93] 87 (10/11 0440) Resp:  [20] 20 (10/11 0440) BP: (105-128)/(71-83) 107/76 (10/11 0440) SpO2:  [97 %-99 %] 97 % (10/11 0440) Last BM Date: 12/04/16  Intake/Output from previous day: 10/10 0701 - 10/11 0700 In: 2195 [P.O.:360; I.V.:1810; NG/GT:25] Out: 1450 [Urine:1050; Emesis/NG output:400] Intake/Output this shift: No intake/output data recorded.  Physical exam:  Distended tympanitic nontender wound is clean no erythema no drainage and tender calves  Lab Results: CBC   Recent Labs  12/11/16 0642 12/12/16 0344  WBC 9.8 8.8  HGB 12.1* 11.5*  HCT 35.9* 34.3*  PLT 167 166   BMET  Recent Labs  12/11/16 0642 12/12/16 0344  NA 139 138  K 4.0 4.2  CL 97* 97*  CO2 35* 35*  GLUCOSE 106* 104*  BUN 7 11  CREATININE 0.71 0.84  CALCIUM 8.6* 8.5*   PT/INR No results for input(s): LABPROT, INR in the last 72 hours. ABG No results for input(s): PHART, HCO3 in the last 72 hours.  Invalid input(s): PCO2, PO2  Studies/Results: No results found.  Anti-infectives: Anti-infectives    Start     Dose/Rate Route Frequency Ordered Stop   12/06/16 1615  cefoTEtan (CEFOTAN) 2 g in dextrose 5 % 50 mL IVPB     2 g 100 mL/hr over 30 Minutes Intravenous  Once 12/06/16 1608 12/06/16 1751      Assessment/Plan: s/p Procedure(s): EXPLORATORY LAPAROTOMY- SBO   Patient doing well just awaiting bowel function. He anticipates bowel function today as to why. Will DC NG tube and start clears once he has passed gas.  [name], MD, FACS  12/12/2016

## 2016-12-13 MED ORDER — BISACODYL 10 MG RE SUPP
10.0000 mg | Freq: Once | RECTAL | Status: AC
  Administered 2016-12-13: 10 mg via RECTAL
  Filled 2016-12-13: qty 1

## 2016-12-13 NOTE — Progress Notes (Signed)
I ambulated with pt around the floor and we started on 4 L but pt's O2 dropped to 86 so I increased to 5 L and instructed pt to take deep breaths while walking. The pulse ox went up to 95 until we got back to the room then it dropped to 77 so I have instructed the pt to avoid holding his breath while walking due to his abdominal incision. I placed the pt back on 2L of O2 in room. I will continue to monitor pt.  Dionisio Paschal

## 2016-12-13 NOTE — Care Management (Signed)
2 liters chronic O2 with [facility].  Per nursing patient currently requiring 4 liters O2.  If patient requires higher liter flow at discharge, Barbara Cower with [facility] states that patient will need a new order for the required flow.

## 2016-12-13 NOTE — Progress Notes (Signed)
Patient started passing gas after the Dulcolax suppository rate he feels better. He also had a small bowel movement. No nausea or vomiting.  Plan remove G and start clear liqids at this point.

## 2016-12-13 NOTE — Progress Notes (Signed)
7 Days Post-Op  Subjective: Patient is awaiting bowel function. He feels a lot of rumbling and feels he is about to pass gas has no nausea vomiting and his pain is well-controlled  Objective: Vital signs in last 24 hours: Temp:  [98.5 F (36.9 C)-98.8 F (37.1 C)] 98.8 F (37.1 C) (10/12 0535) Pulse Rate:  [75-88] 75 (10/12 0535) Resp:  [18-20] 20 (10/12 0535) BP: (110-122)/(74-80) 122/78 (10/12 0535) SpO2:  [89 %-99 %] 99 % (10/12 0535) Last BM Date: 12/04/16  Intake/Output from previous day: 10/11 0701 - 10/12 0700 In: 2235 [I.V.:2235] Out: 2450 [Urine:1000; Emesis/NG output:1450] Intake/Output this shift: No intake/output data recorded.  Physical exam:  Distended and tympanitic but nontender wound is clean no erythema no drainage nontender calves  Lab Results: CBC   Recent Labs  12/11/16 0642 12/12/16 0344  WBC 9.8 8.8  HGB 12.1* 11.5*  HCT 35.9* 34.3*  PLT 167 166   BMET  Recent Labs  12/11/16 0642 12/12/16 0344  NA 139 138  K 4.0 4.2  CL 97* 97*  CO2 35* 35*  GLUCOSE 106* 104*  BUN 7 11  CREATININE 0.71 0.84  CALCIUM 8.6* 8.5*   PT/INR No results for input(s): LABPROT, INR in the last 72 hours. ABG No results for input(s): PHART, HCO3 in the last 72 hours.  Invalid input(s): PCO2, PO2  Studies/Results: No results found.  Anti-infectives: Anti-infectives    Start     Dose/Rate Route Frequency Ordered Stop   12/06/16 1615  cefoTEtan (CEFOTAN) 2 g in dextrose 5 % 50 mL IVPB     2 g 100 mL/hr over 30 Minutes Intravenous  Once 12/06/16 1608 12/06/16 1751      Assessment/Plan: s/p Procedure(s): EXPLORATORY LAPAROTOMY- SBO   We'll attempt stimulate colon With Dulcolax suppository. Awaiting bowel function.  [name], MD, FACS  12/13/2016

## 2016-12-13 NOTE — Progress Notes (Signed)
RN ambulated with pt around the nursing station with oxygen. While ambulating on 2L pt.'s O2 dropped to 84%, RN titrated O2 to 4 L for pt.'s O2 to stay above 93%. RN informed case manager of new findings. Will continue to monitor pt.   Kristy Catoe [employer]

## 2016-12-14 MED ORDER — OXYCODONE-ACETAMINOPHEN 5-325 MG PO TABS
1.0000 | ORAL_TABLET | ORAL | Status: DC | PRN
  Administered 2016-12-14 – 2016-12-15 (×3): 1 via ORAL
  Filled 2016-12-14 (×3): qty 1

## 2016-12-14 NOTE — Discharge Instructions (Signed)
In addition to included general post-operative instructions for Small Bowel Obstruction s/p Open Lysis of Adhesions,  Diet: Gradually resume home heart healthy diet.   Activity: No heavy lifting >15 - 20 pounds (children, pets, laundry, garbage) or strenuous activity until follow-up, but light activity and walking are encouraged. Do not drive or drink alcohol if taking narcotic pain medications.  Wound care: You may shower/get incision wet with soapy water and pat dry (do not rub incisions), but no baths or submerging incision underwater until follow-up.   Medications: Resume all home medications. Continue to not smoke. For mild to moderate pain: acetaminophen (Tylenol) or ibuprofen (if no kidney disease). Combining Tylenol with alcohol can substantially increase your risk of causing liver disease. Narcotic pain medications, if prescribed, can be used for severe pain, though may cause nausea, constipation, and drowsiness. Do not combine Tylenol and Percocet within a 6 hour period as Percocet contains Tylenol. If you do not need the narcotic pain medication, you do not need to fill the prescription.  Call office [phone]) at any time if any questions, worsening pain, fevers/chills, bleeding, drainage from incision site, or other concerns.

## 2016-12-14 NOTE — Progress Notes (Signed)
SURGICAL PROGRESS NOTE  Hospital Day(s): 9.   Post op day(s): 8 Days Post-Op.   Interval History: Patient seen and examined, no acute events or new complaints overnight. Patient reports +flatus and +BM with complete resolution of abdominal pain, denies N/V, fever/chills, CP, or SOB despite desaturation on pulse oximetry with ambulation.  Review of Systems:  Constitutional: denies fever, chills  HEENT: denies cough or congestion  Respiratory: denies any shortness of breath  Cardiovascular: denies chest pain or palpitations  Gastrointestinal: abdominal pain, N/V, and bowel function as per interval history Genitourinary: denies burning with urination or urinary frequency Musculoskeletal: denies pain, decreased motor or sensation Integumentary: denies any other rashes or skin discolorations except post-surgical abdominal wound Neurological: denies HA or vision/hearing changes   Vital signs in last 24 hours: [min-max] current  Temp:  [97.4 F (36.3 C)-99 F (37.2 C)] 98.6 F (37 C) (10/13 0442) Pulse Rate:  [70-87] 70 (10/13 0442) Resp:  [18-20] 18 (10/13 0442) BP: (115-124)/(75-82) 115/78 (10/13 0442) SpO2:  [99 %-100 %] 100 % (10/13 0442) Weight:  [138 lb 1.6 oz (62.6 kg)-180 lb 4 oz (81.8 kg)] 138 lb 1.6 oz (62.6 kg) (10/12 1215)     Height:  (167.6 cm) Weight: 138 lb 1.6 oz (62.6 kg) BMI (Calculated): 22.3   Intake/Output this shift:  Total I/O In: 1109 [P.O.:120; I.V.:989] Out: 900 [Urine:900]   Intake/Output last 2 shifts:  @   Physical Exam:  Constitutional: alert, cooperative and no distress  HENT: normocephalic without obvious abnormality  Eyes: PERRL, EOM's grossly intact and symmetric  Neuro: CN II - XII grossly intact and symmetric without deficit  Respiratory: breathing non-labored at rest  Cardiovascular: regular rate and sinus rhythm  Gastrointestinal: soft, non-tender, and non-distended, incision well-approximated without erythema or  drainage Musculoskeletal: UE and LE FROM, no edema or wounds, motor and sensation grossly intact, NT   Labs:  CBC Latest Ref Rng & Units 12/12/2016 12/11/2016 12/09/2016  WBC 3.8 - 10.6 K/uL 8.8 9.8 18.8(H)  Hemoglobin 13.0 - 18.0 g/dL 11.5(L) 12.1(L) 12.0(L)  Hematocrit 40.0 - 52.0 % 34.3(L) 35.9(L) 36.1(L)  Platelets 150 - 440 K/uL 166 167 107(L)   CMP Latest Ref Rng & Units 12/12/2016 12/11/2016 12/09/2016  Glucose 65 - 99 mg/dL 161(W) 960(A) 540(J)  BUN 6 - 20 mg/dL Creatinine 0.61 - 1.24 mg/dL 8.11 9.14 7.82  Sodium 135 - 145 mmol/L 138 139 135  Potassium 3.5 - 5.1 mmol/L 4.2 4.0 4.2  Chloride 101 - 111 mmol/L 97(L) 97(L) 95(L)  CO2 22 - 32 mmol/L 35(H) 35(H) 34(H)  Calcium 8.9 - 10.3 mg/dL 9.5(A) 2.1(H) 8.3(L)  Total Protein 6.5 - 8.1 g/dL - - -  Total Bilirubin 0.3 - 1.2 mg/dL - - -  Alkaline Phos 38 - 126 U/L - - -  AST 15 - 41 U/L - - -  ALT 17 - 63 U/L - - -   Imaging studies: No new pertinent imaging studies   Assessment/Plan:(ICD-10's: K56.52) 45 y.o.malewith post-operative ileus with improving leukocytosis 8Days Post-Ops/p exploratory laparotomy with lysis of adhesionsfor non-resolving complete small bowel obstruction, likely attributable to post-surgical adhesions following remote open appendectomy, complicated by relative hypokalemia and by pertinent comorbidities including COPD on home O2 for chronic hypoxia and 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  - advance to soft diet - pain control prn, minimize narcotics - continue home O2 via  and nebulizers prn - ambulation encouraged, DVT prophylaxis  - discharge planning PM vs tomorrow  All of the above findings and recommendations were discussed with the patient and patient's RN, and all of patient's questions were answered to his expressed satisfaction.  -- Scherrie Gerlach Earlene Plater, MD, RPVI [place]: [facility] - Partnering for  exceptional care. Office: [phone]

## 2016-12-15 MED ORDER — OXYCODONE-ACETAMINOPHEN 5-325 MG PO TABS: 1.0000 | ORAL_TABLET | ORAL | 0 refills | Status: DC | PRN

## 2016-12-15 NOTE — Discharge Summary (Signed)
Physician Discharge Summary  Patient ID: Chris Case MRN: [id number] DOB/AGE: 07-18-1970 46 y.o.  Admit date: 12/04/2016 Discharge date: 12/15/2016  Admission Diagnoses:  Discharge Diagnoses:  Active Problems:   Intestinal adhesions with complete obstruction (HCC)   Constipation   SBO (small bowel obstruction) (HCC)   Protein-calorie malnutrition, severe   Discharged Condition: fair  Hospital Course: 46 y.o. male presented to [facility] ED for abdominal pain. Workup was found to be significant for CT imaging demonstrating SBO. Non-operative treatment was initiated with insertion of NG tube. While patient's pain initially improved, it seemed to again worsen with persistent drainage from NG tube. Informed consent was obtained and documented, and patient underwent uneventful laparotomy with lysis of large dense adhesions Chris Case, 12/06/2016).  Post-operatively, patient experienced a post-operative ileus, which improved/resolved and removal of NG tube with advancement of patient's diet and ambulation were well-tolerated. The remainder of patient's hospital course was essentially unremarkable, and discharge planning was initiated accordingly with patient safely able to be discharged home with appropriate discharge instructions, pain control, baseline home oxygen, and outpatient surgical follow-up after all of his questions were answered to his expressed satisfaction.  Consults: None  Significant Diagnostic Studies: radiology: CT scan: small bowel obstruction  Treatments: IV hydration, procedures: insertion of nasogastric tube and surgery: exploratory laparotomy with lysis of adhesions  Discharge Exam: Blood pressure 114/75, pulse 69, temperature 98.1 F (36.7 C), temperature source Oral, resp. rate 16, height  (1.676 m), weight 138 lb 1.6 oz (62.6 kg), SpO2 98 %. General appearance: alert, cooperative and no distress Resp: clear to auscultation bilaterally and baseline 2L oxygen via nasal  canula GI: abdomen soft, NT, and ND with post-surgical incision well-approximated without erythema or drainage  Disposition: 01-Home or Self Care   Allergies as of 12/15/2016   No Known Allergies     Medication List    TAKE these medications   albuterol 108 (90 Base) MCG/ACT inhaler Commonly known as:  PROVENTIL HFA;VENTOLIN HFA Inhale 2 puffs into the lungs every 2 (two) hours as needed for wheezing or shortness of breath.   albuterol (2.5 MG/3ML) 0.083% nebulizer solution Commonly known as:  PROVENTIL Inhale 3 mLs into the lungs every 4 (four) hours as needed for wheezing or shortness of breath.   aspirin 81 MG EC tablet Take 1 tablet (81 mg total) by mouth daily.   furosemide 20 MG tablet Commonly known as:  LASIX Take 20 mg by mouth daily.   mometasone-formoterol 200-5 MCG/ACT Aero Commonly known as:  DULERA Inhale 2 puffs into the lungs 2 (two) times daily.   oxyCODONE-acetaminophen 5-325 MG tablet Commonly known as:  PERCOCET/ROXICET Take 1 tablet by mouth every 4 (four) hours as needed for severe pain.   OXYGEN Inhale 2 L into the lungs.   tiotropium 18 MCG inhalation capsule Commonly known as:  SPIRIVA Place 1 capsule (18 mcg total) into inhaler and inhale daily.      Follow-up Information    [facility]. Schedule an appointment as soon as possible for a visit in 2 week(s).   Specialty:  General Surgery Why:  Call as soon as possible to schedule outpatient surgical follow-up appointment between 1 to 2 weeks. Call office if any questions or concerns prior to appointment. Contact information: [address] [place] [address] [phone]          Signed: Ancil Linsey 12/15/2016, 7:11 AM

## 2016-12-15 NOTE — Progress Notes (Addendum)
Chris Case to be D/C'd home  per MD order.  Discussed prescriptions and follow up appointments with the patient. Prescriptions given to patient, medication list explained in detail. Pt verbalized understanding.  Allergies as of 12/15/2016   No Known Allergies     Medication List    TAKE these medications   albuterol 108 (90 Base) MCG/ACT inhaler Commonly known as:  PROVENTIL HFA;VENTOLIN HFA Inhale 2 puffs into the lungs every 2 (two) hours as needed for wheezing or shortness of breath.   albuterol (2.5 MG/3ML) 0.083% nebulizer solution Commonly known as:  PROVENTIL Inhale 3 mLs into the lungs every 4 (four) hours as needed for wheezing or shortness of breath.   aspirin 81 MG EC tablet Take 1 tablet (81 mg total) by mouth daily.   furosemide 20 MG tablet Commonly known as:  LASIX Take 20 mg by mouth daily.   mometasone-formoterol 200-5 MCG/ACT Aero Commonly known as:  DULERA Inhale 2 puffs into the lungs 2 (two) times daily.   oxyCODONE-acetaminophen 5-325 MG tablet Commonly known as:  PERCOCET/ROXICET Take 1 tablet by mouth every 4 (four) hours as needed for severe pain.   OXYGEN Inhale 2 L into the lungs.   tiotropium 18 MCG inhalation capsule Commonly known as:  SPIRIVA Place 1 capsule (18 mcg total) into inhaler and inhale daily.       Vitals:   12/14/16 2039 12/15/16 0419  BP: 96/68 114/75  Pulse: 74 69  Resp: 16 16  Temp: 98.5 F (36.9 C) 98.1 F (36.7 C)  SpO2: 94% 98%    Skin clean, dry and intact without evidence of skin break down, no evidence of skin tears noted. IV catheter discontinued intact. Site without signs and symptoms of complications. Dressing and pressure applied. Pt denies pain at this time. No complaints noted.  An After Visit Summary was printed and given to the patient. Patient escorted via WC, and D/C home via private auto. Pt prefrers to take pain meds and await ride   [name]

## 2016-12-17 ENCOUNTER — Telehealth: Payer: Self-pay

## 2016-12-17 NOTE — Telephone Encounter (Signed)
Post-op call made to patient at this time. Spoke with Trula Ore. Post-op interview questions below.  1. How are you feeling? Patient stated that he is feeling okay can't complain  2. Is your pain controlled? Yes  3. What are you doing for the pain? Taking pain medication as needed  4. Are you having any Nausea or Vomiting? None  5. Are you having any Fever or Chills? None  6. Are you having any Constipation or Diarrhea? None  7. Is there any Swelling or Bruising you are concerned about? None  8. Do you have any questions or concerns at this time? None   Discussion: Reminded of appointment on 10/25 at 1:45PM. Advised patient to call us if he has any questions or concerns prior to his appointment. Patient verbalized understanding.

## 2016-12-24 ENCOUNTER — Other Ambulatory Visit: Payer: Self-pay

## 2016-12-26 ENCOUNTER — Ambulatory Visit (INDEPENDENT_AMBULATORY_CARE_PROVIDER_SITE_OTHER): Payer: Medicaid Other | Admitting: Surgery

## 2016-12-26 ENCOUNTER — Encounter: Payer: Self-pay | Admitting: Surgery

## 2016-12-26 VITALS — BP 134/83 | HR 101 | Temp 97.8°F | Resp 16 | Ht 66.0 in | Wt 138.2 lb

## 2016-12-26 DIAGNOSIS — K56609 Unspecified intestinal obstruction, unspecified as to partial versus complete obstruction: Secondary | ICD-10-CM

## 2016-12-26 NOTE — Progress Notes (Signed)
Outpatient postop visit  12/26/2016  Chris Case is an 46 y.o. male.    Procedure: Ex lap and lysis of adhesions for small bowel obstruction  CC: No complaints  HPI: This patient status post exploratory laparotomy and extensive lysis of adhesions for small bowel obstruction.  Had a prolonged postoperative ileus. Patient feels well he is eating well having bowel movements no nausea vomiting fevers or chills  Medications reviewed.    Physical Exam:  There were no vitals taken for this visit.    PE: On home oxygen Abdomen soft nondistended nontympanitic and nontender wound is clean without erythema or drainage staples are removed.  Calves are nontender    Assessment/Plan:  Status post ex lap with LOA for SBO.  Patient doing very well staples removed today he will follow-up on an as-needed basis.  Lattie Hawichard E Suesan Mohrmann, MD, FACS

## 2017-01-17 ENCOUNTER — Encounter: Payer: Self-pay | Admitting: Surgery

## 2017-01-17 ENCOUNTER — Ambulatory Visit (INDEPENDENT_AMBULATORY_CARE_PROVIDER_SITE_OTHER): Payer: Medicaid Other | Admitting: Surgery

## 2017-01-17 VITALS — BP 128/78 | HR 84 | Temp 98.0°F | Ht 66.0 in | Wt 146.0 lb

## 2017-01-17 DIAGNOSIS — K56609 Unspecified intestinal obstruction, unspecified as to partial versus complete obstruction: Secondary | ICD-10-CM

## 2017-01-17 NOTE — Patient Instructions (Signed)
If you have any questions or concerns please give our office a call. 

## 2017-01-17 NOTE — Progress Notes (Signed)
Outpatient postop visit  01/17/2017  Chris Case is an 46 y.o. male.    Procedure: Exploratory laparotomy for small bowel obstruction  CC: Patient is thinks there is still a staple present  HPI: This a patient who underwent a exploratory laparotomy for small bowel obstruction.  That was performed on 5 October.  He walked into the office asking to be seen for what he believed to be a staple present in his incision that had not been removed.  Patient remains on home oxygen therapy  Patient also reports that he was here in this office " 2 doors down"and had a suture cut in the exact same place.  I cannot find any documentation concerning that  Medications reviewed.    Physical Exam:  There were no vitals taken for this visit.    PE: Oxygen dependent carrying a generator no acute distress  Very short 1 mm section of PDS palpable through a 1 mm defect in the skin on the midline incision in the infraumbilical area.  No erythema no drainage no purulence.  That suture is cut lower.  No further palpable suture.      Assessment/Plan:  No staple identified at the site of complaint.  PDS suture is cut short as it was extruding through the skin.  Patient will return as needed.  Chris Hawichard E [name], MD, FACS

## 2017-10-06 ENCOUNTER — Other Ambulatory Visit: Payer: Self-pay

## 2017-10-06 ENCOUNTER — Encounter: Payer: Medicaid Other | Attending: Specialist

## 2017-10-06 VITALS — Ht 67.2 in | Wt 155.9 lb

## 2017-10-06 DIAGNOSIS — J449 Chronic obstructive pulmonary disease, unspecified: Secondary | ICD-10-CM | POA: Insufficient documentation

## 2017-10-06 NOTE — Progress Notes (Signed)
Daily Session Note  Patient Details  Name: Chris Case MRN: [id number] Date of Birth: Dec 07, 1970 Referring Provider:     Pulmonary Rehab from 10/06/2017 in [facility] Pul[facility]  Referring Provider  Ancil Linsey MD      Encounter Date: 10/06/2017  Check In: Session Check In - 10/06/17 1402      Check-In   Supervising physician immediately available to respond to emergencies  [facility] immediately available ER MD    Physician(s)  Dr. Joni Fears and Corky Downs    Location  [facility]-Cardiac & Pulmonary Rehab    Staff Present  Alberteen Sam, MA, RCEP, CCRP, Exercise Physiologist;Loukas Antonson Tessie Fass RCP,RRT,BSRT    Medication changes reported      No    Fall or balance concerns reported     No    Warm-up and Cool-down  Not performed (comment) medical evaluation    Resistance Training Performed  No    VAD Patient?  No      Pain Assessment   Currently in Pain?  No/denies        Exercise Prescription Changes - 10/06/17 1400      Response to Exercise   Blood Pressure (Admit)  130/60    Blood Pressure (Exercise)  138/74    Blood Pressure (Exit)  132/74    Heart Rate (Admit)  78 bpm    Heart Rate (Exercise)  102 bpm    Heart Rate (Exit)  87 bpm    Oxygen Saturation (Admit)  96 %    Oxygen Saturation (Exercise)  85 %    Oxygen Saturation (Exit)  94 %    Rating of Perceived Exertion (Exercise)  11    Perceived Dyspnea (Exercise)  2    Symptoms  SOB    Comments  walk test results       Social History   Tobacco Use  Smoking Status Former Smoker  . Packs/day: 3.00  . Years: 20.00  . Pack years: 60.00  . Types: Cigarettes  . Last attempt to quit: 04/11/2016  . Years since quitting: 1.4  Smokeless Tobacco Never Used    Goals Met:  Exercise tolerated well No report of cardiac concerns or symptoms Strength training completed today  Goals Unmet:  Not Applicable  Comments: Service time 1350-1450   Dr. Emily Filbert is Medical Director for [name]  and [facility].

## 2017-10-06 NOTE — Patient Instructions (Signed)
Patient Instructions  Patient Details  Name: Chris Case MRN: [id number] Date of Birth: 1970/08/29 Referring Provider:  Mertie MooresFleming, Herbon E, MD  Below are your personal goals for exercise, nutrition, and risk factors. Our goal is to help you stay on track towards obtaining and maintaining these goals. We will be discussing your progress on these goals with you throughout the program.  Initial Exercise Prescription: Initial Exercise Prescription - 10/06/17 1400      Date of Initial Exercise RX and Referring Provider   Date  10/06/17    Referring Provider  Brett CanalesFleming, [place] MD      Oxygen   Oxygen  Continuous    Liters  3      Treadmill   MPH  1.7    Grade  0    Minutes  15    METs  2.3      REL-XR   Level  3    Speed  50    Minutes  15    METs  2.3      T5 Nustep   Level  3    SPM  80    Minutes  15    METs  2.3      Prescription Details   Frequency (times per week)  3    Duration  Progress to 45 minutes of aerobic exercise without signs/symptoms of physical distress      Intensity   THRR 40-80% of Max Heartrate  116-154    Ratings of Perceived Exertion  11-13    Perceived Dyspnea  0-4      Progression   Progression  Continue to progress workloads to maintain intensity without signs/symptoms of physical distress.      Resistance Training   Training Prescription  Yes    Weight  3 lbs    Reps  10-15       Exercise Goals: Frequency: Be able to perform aerobic exercise two to three times per week in program working toward 2-5 days per week of home exercise.  Intensity: Work with a perceived exertion of 11 (fairly light) - 15 (hard) while following your exercise prescription.  We will make changes to your prescription with you as you progress through the program.   Duration: Be able to do 30 to 45 minutes of continuous aerobic exercise in addition to a 5 minute warm-up and a 5 minute cool-down routine.   Nutrition Goals: Your personal nutrition goals will be  established when you do your nutrition analysis with the dietician.  The following are general nutrition guidelines to follow: Cholesterol < 200mg /day Sodium < 1500mg /day Fiber: Men under 50 yrs - 38 grams per day  Personal Goals: Personal Goals and Risk Factors at Admission - 10/06/17 1431      Core Components/Risk Factors/Patient Goals on Admission    Weight Management  Yes;Weight Maintenance    Intervention  Weight Management: Develop a combined nutrition and exercise program designed to reach desired caloric intake, while maintaining appropriate intake of nutrient and fiber, sodium and fats, and appropriate energy expenditure required for the weight goal.;Weight Management: Provide education and appropriate resources to help participant work on and attain dietary goals.;Weight Management/Obesity: Establish reasonable short term and long term weight goals.    Admit Weight  155 lb 14.4 oz (70.7 kg)    Goal Weight: Short Term  155 lb (70.3 kg)    Goal Weight: Long Term  155 lb (70.3 kg)    Expected Outcomes  Short Term: Continue to  assess and modify interventions until short term weight is achieved;Long Term: Adherence to nutrition and physical activity/exercise program aimed toward attainment of established weight goal;Weight Maintenance: Understanding of the daily nutrition guidelines, which includes 25-35% calories from fat, 7% or less cal from saturated fats, less than 200mg  cholesterol, less than 1.5gm of sodium, & 5 or more servings of fruits and vegetables daily;Understanding recommendations for meals to include 15-35% energy as protein, 25-35% energy from fat, 35-60% energy from carbohydrates, less than 200mg  of dietary cholesterol, 20-35 gm of total fiber daily;Understanding of distribution of calorie intake throughout the day with the consumption of 4-5 meals/snacks    Improve shortness of breath with ADL's  Yes    Intervention  Provide education, individualized exercise plan and daily  activity instruction to help decrease symptoms of SOB with activities of daily living.    Expected Outcomes  Long Term: Be able to perform more ADLs without symptoms or delay the onset of symptoms;Short Term: Improve cardiorespiratory fitness to achieve a reduction of symptoms when performing ADLs       Tobacco Use Initial Evaluation: Social History   Tobacco Use  Smoking Status Former Smoker  . Packs/day: 3.00  . Years: 20.00  . Pack years: 60.00  . Types: Cigarettes  . Last attempt to quit: 04/11/2016  . Years since quitting: 1.4  Smokeless Tobacco Never Used    Exercise Goals and Review: Exercise Goals    Row Name 10/06/17 1424             Exercise Goals   Increase Physical Activity  Yes       Intervention  Provide advice, education, support and counseling about physical activity/exercise needs.;Develop an individualized exercise prescription for aerobic and resistive training based on initial evaluation findings, risk stratification, comorbidities and participant's personal goals.       Expected Outcomes  Short Term: Attend rehab on a regular basis to increase amount of physical activity.;Long Term: Add in home exercise to make exercise part of routine and to increase amount of physical activity.;Long Term: Exercising regularly at least 3-5 days a week.       Increase Strength and Stamina  Yes       Intervention  Provide advice, education, support and counseling about physical activity/exercise needs.;Develop an individualized exercise prescription for aerobic and resistive training based on initial evaluation findings, risk stratification, comorbidities and participant's personal goals.       Expected Outcomes  Short Term: Increase workloads from initial exercise prescription for resistance, speed, and METs.;Short Term: Perform resistance training exercises routinely during rehab and add in resistance training at home;Long Term: Improve cardiorespiratory fitness, muscular  endurance and strength as measured by increased METs and functional capacity ( )       Able to understand and use rate of perceived exertion (RPE) scale  Yes       Intervention  Provide education and explanation on how to use RPE scale       Expected Outcomes  Short Term: Able to use RPE daily in rehab to express subjective intensity level;Long Term:  Able to use RPE to guide intensity level when exercising independently       Able to understand and use Dyspnea scale  Yes       Intervention  Provide education and explanation on how to use Dyspnea scale       Expected Outcomes  Short Term: Able to use Dyspnea scale daily in rehab to express subjective sense of shortness of breath  during exertion;Long Term: Able to use Dyspnea scale to guide intensity level when exercising independently       Knowledge and understanding of Target Heart Rate Range (THRR)  Yes       Intervention  Provide education and explanation of THRR including how the numbers were predicted and where they are located for reference       Expected Outcomes  Short Term: Able to state/look up THRR;Short Term: Able to use daily as guideline for intensity in rehab;Long Term: Able to use THRR to govern intensity when exercising independently       Able to check pulse independently  Yes       Intervention  Provide education and demonstration on how to check pulse in carotid and radial arteries.;Review the importance of being able to check your own pulse for safety during independent exercise       Expected Outcomes  Short Term: Able to explain why pulse checking is important during independent exercise;Long Term: Able to check pulse independently and accurately       Understanding of Exercise Prescription  Yes       Intervention  Provide education, explanation, and written materials on patient's individual exercise prescription       Expected Outcomes  Short Term: Able to explain program exercise prescription;Long Term: Able to explain  home exercise prescription to exercise independently          Copy of goals given to participant.

## 2017-10-06 NOTE — Progress Notes (Signed)
Pulmonary Individual Treatment Plan  Patient Details  Name: Chris Case MRN: [id number] Date of Birth: 01-Apr-1970 Referring Provider:     Pulmonary Rehab from 10/06/2017 in [facility] Cardiac and Pulmonary Rehab  Referring Provider  Ancil Linsey MD      Initial Encounter Date:    Pulmonary Rehab from 10/06/2017 in [facility] Cardiac and Pulmonary Rehab  Date  10/06/17      Visit Diagnosis: Chronic obstructive pulmonary disease, unspecified COPD type ([place])  [name] Home Medications on Admission:  Current Outpatient Medications:  .  albuterol (PROVENTIL) (2.5 MG/3ML) 0.083% nebulizer solution, Inhale 3 mLs into the lungs every 4 (four) hours as needed for wheezing or shortness of breath., Disp: 75 mL, Rfl: 12 .  aspirin EC 81 MG EC tablet, Take 1 tablet (81 mg total) by mouth daily., Disp: 30 tablet, Rfl: 6 .  furosemide (LASIX) 20 MG tablet, Take 20 mg by mouth daily., Disp: , Rfl: 0 .  mometasone-formoterol (DULERA) 200-5 MCG/ACT AERO, Inhale 2 puffs into the lungs 2 (two) times daily., Disp: 1 Inhaler, Rfl: 5 .  OXYGEN, Inhale 2 L into the lungs., Disp: , Rfl:   Past Medical History: Past Medical History:  Diagnosis Date  . COPD (chronic obstructive pulmonary disease) ([place])   . Emphysema lung ([place])   . 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 Packs/day: 3.00  . Years: 20.00  . Pack years: 60.00  . Types: Cigarettes  . Last attempt to quit: 04/11/2016  . Years since quitting: 1.4  Smokeless Tobacco Never Used    Labs: Recent Review Flowsheet Data    There is no flowsheet data to display.       Pulmonary Assessment Scores: Pulmonary Assessment Scores    Row Name 10/06/17 1404         ADL UCSD   ADL Phase  Entry     SOB Score total  47     Rest  4     Walk  3     Stairs  2     Bath  2     Dress  2     Shop  2       CAT Score   CAT Score  22       mMRC Score    mMRC Score  2        Pulmonary Function Assessment: Pulmonary Function Assessment - 10/06/17 1408      Initial Spirometry Results   FVC%  30 % Test done on 06/19/16 Care Everywhere    FEV1%  17 %    FEV1/FVC Ratio  46      Post Bronchodilator Spirometry Results   FVC%  35 % Test done on 06/19/16 Care Everywhere    FEV1%  20 %    FEV1/FVC Ratio  46      Breath   Bilateral Breath Sounds  Decreased    Shortness of Breath  Yes;Panic with Shortness of Breath;Limiting activity       Exercise Target Goals: Date: 10/06/17  Exercise Program Goal: Individual exercise prescription set using results from initial 6 min walk test and THRR while considering  patient's activity barriers and safety.    Exercise Prescription Goal: Initial exercise prescription builds to 30-45 minutes a day of aerobic activity, 2-3 days per week.  Home exercise guidelines will be given to patient during  program as part of exercise prescription that the participant will acknowledge.  Activity Barriers & Risk Stratification: Activity Barriers & Cardiac Risk Stratification - 10/06/17 1422      Activity Barriers & Cardiac Risk Stratification   Activity Barriers  Deconditioning;Shortness of Breath;Muscular Weakness       6 Minute Walk: 6 Minute Walk    Row Name 10/06/17 1420         6 Minute Walk   Phase  Initial     Distance  920 feet     Walk Time  6 minutes     # of Rest Breaks  0     MPH  1.74     METS  3.75     RPE  11     Perceived Dyspnea   2     VO2 Peak  13.14     Symptoms  Yes (comment)     Comments  SOB     Resting HR  78 bpm     Resting BP  130/60     Resting Oxygen Saturation   96 %     Exercise Oxygen Saturation  during 6 min walk  85 %     Max Ex. HR  102 bpm     Max Ex. BP  138/74     2 Minute Post BP  132/74       Interval HR   1 Minute HR  93     2 Minute HR  94     3 Minute HR  96     4 Minute HR  100     5 Minute HR  101     6 Minute HR  102     2 Minute Post HR  87      Interval Heart Rate?  Yes       Interval Oxygen   Interval Oxygen?  Yes     Baseline Oxygen Saturation %  96 %     1 Minute Oxygen Saturation %  92 %     1 Minute Liters of Oxygen  3 L     2 Minute Oxygen Saturation %  92 % 2:33 83%     2 Minute Liters of Oxygen  3 L     3 Minute Oxygen Saturation %  88 %     3 Minute Liters of Oxygen  3 L     4 Minute Oxygen Saturation %  86 %     4 Minute Liters of Oxygen  3 L     5 Minute Oxygen Saturation %  86 %     5 Minute Liters of Oxygen  3 L     6 Minute Oxygen Saturation %  86 % 6:05 85%     6 Minute Liters of Oxygen  3 L     2 Minute Post Oxygen Saturation %  94 %     2 Minute Post Liters of Oxygen  3 L       Oxygen Initial Assessment: Oxygen Initial Assessment - 10/06/17 1430      Home Oxygen   Home Oxygen Device  Home Concentrator;E-Tanks    Sleep Oxygen Prescription  Continuous    Liters per minute  2    Home Exercise Oxygen Prescription  Continuous    Liters per minute  3    Home at Rest Exercise Oxygen Prescription  Continuous    Liters per minute  3    Compliance with Home  Oxygen Use  Yes      Initial 6 min Walk   Oxygen Used  Continuous    Liters per minute  3      Program Oxygen Prescription   Program Oxygen Prescription  Continuous    Liters per minute  3      Intervention   Short Term Goals  To learn and exhibit compliance with exercise, home and travel O2 prescription;To learn and understand importance of monitoring SPO2 with pulse oximeter and demonstrate accurate use of the pulse oximeter.;To learn and understand importance of maintaining oxygen saturations>88%;To learn and demonstrate proper pursed lip breathing techniques or other breathing techniques.;To learn and demonstrate proper use of respiratory medications    Long  Term Goals  Exhibits compliance with exercise, home and travel O2 prescription;Verbalizes importance of monitoring SPO2 with pulse oximeter and return demonstration;Maintenance of O2  saturations>88%;Exhibits proper breathing techniques, such as pursed lip breathing or other method taught during program session;Compliance with respiratory medication;Demonstrates proper use of MDI's       Oxygen Re-Evaluation:   Oxygen Discharge (Final Oxygen Re-Evaluation):   Initial Exercise Prescription: Initial Exercise Prescription - 10/06/17 1400      Date of Initial Exercise RX and Referring Provider   Date  10/06/17    Referring Provider  Ancil Linsey MD      Oxygen   Oxygen  Continuous    Liters  3      Treadmill   MPH  1.7    Grade  0    Minutes  15    METs  2.3      REL-XR   Level  3    Speed  50    Minutes  15    METs  2.3      T5 Nustep   Level  3    SPM  80    Minutes  15    METs  2.3      Prescription Details   Frequency (times per week)  3    Duration  Progress to 45 minutes of aerobic exercise without signs/symptoms of physical distress      Intensity   THRR 40-80% of Max Heartrate  116-154    Ratings of Perceived Exertion  11-13    Perceived Dyspnea  0-4      Progression   Progression  Continue to progress workloads to maintain intensity without signs/symptoms of physical distress.      Resistance Training   Training Prescription  Yes    Weight  3 lbs    Reps  10-15       Perform Capillary Blood Glucose checks as needed.  Exercise Prescription Changes: Exercise Prescription Changes    Row Name 10/06/17 1400             Response to Exercise   Blood Pressure (Admit)  130/60       Blood Pressure (Exercise)  138/74       Blood Pressure (Exit)  132/74       Heart Rate (Admit)  78 bpm       Heart Rate (Exercise)  102 bpm       Heart Rate (Exit)  87 bpm       Oxygen Saturation (Admit)  96 %       Oxygen Saturation (Exercise)  85 %       Oxygen Saturation (Exit)  94 %       Rating of Perceived Exertion (Exercise)  11  Perceived Dyspnea (Exercise)  2       Symptoms  SOB       Comments  walk test results           Exercise Comments:   Exercise Goals and Review: Exercise Goals    Row Name 10/06/17 1424             Exercise Goals   Increase Physical Activity  Yes       Intervention  Provide advice, education, support and counseling about physical activity/exercise needs.;Develop an individualized exercise prescription for aerobic and resistive training based on initial evaluation findings, risk stratification, comorbidities and participant's personal goals.       Expected Outcomes  Short Term: Attend rehab on a regular basis to increase amount of physical activity.;Long Term: Add in home exercise to make exercise part of routine and to increase amount of physical activity.;Long Term: Exercising regularly at least 3-5 days a week.       Increase Strength and Stamina  Yes       Intervention  Provide advice, education, support and counseling about physical activity/exercise needs.;Develop an individualized exercise prescription for aerobic and resistive training based on initial evaluation findings, risk stratification, comorbidities and participant's personal goals.       Expected Outcomes  Short Term: Increase workloads from initial exercise prescription for resistance, speed, and METs.;Short Term: Perform resistance training exercises routinely during rehab and add in resistance training at home;Long Term: Improve cardiorespiratory fitness, muscular endurance and strength as measured by increased METs and functional capacity (6MWT)       Able to understand and use rate of perceived exertion (RPE) scale  Yes       Intervention  Provide education and explanation on how to use RPE scale       Expected Outcomes  Short Term: Able to use RPE daily in rehab to express subjective intensity level;Long Term:  Able to use RPE to guide intensity level when exercising independently       Able to understand and use Dyspnea scale  Yes       Intervention  Provide education and explanation on how to use Dyspnea scale        Expected Outcomes  Short Term: Able to use Dyspnea scale daily in rehab to express subjective sense of shortness of breath during exertion;Long Term: Able to use Dyspnea scale to guide intensity level when exercising independently       Knowledge and understanding of Target Heart Rate Range (THRR)  Yes       Intervention  Provide education and explanation of THRR including how the numbers were predicted and where they are located for reference       Expected Outcomes  Short Term: Able to state/look up THRR;Short Term: Able to use daily as guideline for intensity in rehab;Long Term: Able to use THRR to govern intensity when exercising independently       Able to check pulse independently  Yes       Intervention  Provide education and demonstration on how to check pulse in carotid and radial arteries.;Review the importance of being able to check your own pulse for safety during independent exercise       Expected Outcomes  Short Term: Able to explain why pulse checking is important during independent exercise;Long Term: Able to check pulse independently and accurately       Understanding of Exercise Prescription  Yes       Intervention  Provide education,  explanation, and written materials on patient's individual exercise prescription       Expected Outcomes  Short Term: Able to explain program exercise prescription;Long Term: Able to explain home exercise prescription to exercise independently          Exercise Goals Re-Evaluation :   Discharge Exercise Prescription (Final Exercise Prescription Changes): Exercise Prescription Changes - 10/06/17 1400      Response to Exercise   Blood Pressure (Admit)  130/60    Blood Pressure (Exercise)  138/74    Blood Pressure (Exit)  132/74    Heart Rate (Admit)  78 bpm    Heart Rate (Exercise)  102 bpm    Heart Rate (Exit)  87 bpm    Oxygen Saturation (Admit)  96 %    Oxygen Saturation (Exercise)  85 %    Oxygen Saturation (Exit)  94 %    Rating  of Perceived Exertion (Exercise)  11    Perceived Dyspnea (Exercise)  2    Symptoms  SOB    Comments  walk test results       Nutrition:  Target Goals: Understanding of nutrition guidelines, daily intake of sodium <1581m, cholesterol <2077m calories 30% from fat and 7% or less from saturated fats, daily to have 5 or more servings of fruits and vegetables.  Biometrics: Pre Biometrics - 10/06/17 1424      Pre Biometrics   Height  5' 7.2" (1.707 m)    Weight  155 lb 14.4 oz (70.7 kg)    Waist Circumference  35 inches    Hip Circumference  37 inches    Waist to Hip Ratio  0.95 %    BMI (Calculated)  24.27        Nutrition Therapy Plan and Nutrition Goals: Nutrition Therapy & Goals - 10/06/17 1423      Personal Nutrition Goals   Nutrition Goal  He wants to maintain weight.    Comments  He wants to maintain weight. He likes to snack alot. He eats potato chips cookies and honey nut cheerios.      Intervention Plan   Intervention  Prescribe, educate and counsel regarding individualized specific dietary modifications aiming towards targeted core components such as weight, hypertension, lipid management, diabetes, heart failure and other comorbidities.    Expected Outcomes  Short Term Goal: Understand basic principles of dietary content, such as calories, fat, sodium, cholesterol and nutrients.;Long Term Goal: Adherence to prescribed nutrition plan.       Nutrition Assessments:   Nutrition Goals Re-Evaluation:   Nutrition Goals Discharge (Final Nutrition Goals Re-Evaluation):   Psychosocial: Target Goals: Acknowledge presence or absence of significant depression and/or stress, maximize coping skills, provide positive support system. Participant is able to verbalize types and ability to use techniques and skills needed for reducing stress and depression.   Initial Review & Psychosocial Screening: Initial Psych Review & Screening - 10/06/17 1421      Initial Review    Current issues with  Current Stress Concerns    Source of Stress Concerns  Chronic Illness    Comments  His breathing is his main concern      Family Dynamics   Good Support System?  Yes    Comments  His oldest sister is good for support.      Barriers   Psychosocial barriers to participate in program  The patient should benefit from training in stress management and relaxation.      Screening Interventions   Interventions  Encouraged to  exercise;To provide support and resources with identified psychosocial needs;Program counselor consult;Provide feedback about the scores to participant    Expected Outcomes  Short Term goal: Utilizing psychosocial counselor, staff and physician to assist with identification of specific Stressors or current issues interfering with healing process. Setting desired goal for each stressor or current issue identified.;Long Term Goal: Stressors or current issues are controlled or eliminated.;Short Term goal: Identification and review with participant of any Quality of Life or Depression concerns found by scoring the questionnaire.;Long Term goal: The participant improves quality of Life and PHQ9 Scores as seen by post scores and/or verbalization of changes       Quality of Life Scores:  Scores of 19 and below usually indicate a poorer quality of life in these areas.  A difference of  2-3 points is a clinically meaningful difference.  A difference of 2-3 points in the total score of the Quality of Life Index has been associated with significant improvement in overall quality of life, self-image, physical symptoms, and general health in studies assessing change in quality of life.  PHQ-9: Recent Review Flowsheet Data    Depression screen [facility] 2/9 10/06/2017   Decreased Interest 3   Down, Depressed, Hopeless 3   PHQ - 2 Score 6   Altered sleeping 0   Tired, decreased energy 2   Change in appetite 0   Feeling bad or failure about yourself  0   Trouble concentrating  0   Moving slowly or fidgety/restless 0   Suicidal thoughts 0   PHQ-9 Score 8   Difficult doing work/chores Somewhat difficult     Interpretation of Total Score  Total Score Depression Severity:  1-4 = Minimal depression, 5-9 = Mild depression, 10-14 = Moderate depression, 15-19 = Moderately severe depression, 20-27 = Severe depression   Psychosocial Evaluation and Intervention:   Psychosocial Re-Evaluation:   Psychosocial Discharge (Final Psychosocial Re-Evaluation):   Education: Education Goals: Education classes will be provided on a weekly basis, covering required topics. Participant will state understanding/return demonstration of topics presented.  Learning Barriers/Preferences: Learning Barriers/Preferences - 10/06/17 1426      Learning Barriers/Preferences   Learning Barriers  None    Learning Preferences  None       Education Topics:  Initial Evaluation Education: - Verbal, written and demonstration of respiratory meds, oximetry and breathing techniques. Instruction on use of nebulizers and MDIs and importance of monitoring MDI activations.   Pulmonary Rehab from 10/06/2017 in [facility] Cardiac and Pulmonary Rehab  Date  10/06/17  Educator  [facility]  Instruction Review Code  1- Verbalizes Understanding      General Nutrition Guidelines/Fats and Fiber: -Group instruction provided by verbal, written material, models and posters to present the general guidelines for heart healthy nutrition. Gives an explanation and review of dietary fats and fiber.   Controlling Sodium/Reading Food Labels: -Group verbal and written material supporting the discussion of sodium use in heart healthy nutrition. Review and explanation with models, verbal and written materials for utilization of the food label.   Exercise Physiology & General Exercise Guidelines: - Group verbal and written instruction with models to review the exercise physiology of the cardiovascular system and associated  critical values. Provides general exercise guidelines with specific guidelines to those with heart or lung disease.    Aerobic Exercise & Resistance Training: - Gives group verbal and written instruction on the various components of exercise. Focuses on aerobic and resistive training programs and the benefits of this training and how to  safely progress through these programs.   Flexibility, Balance, Mind/Body Relaxation: Provides group verbal/written instruction on the benefits of flexibility and balance training, including mind/body exercise modes such as yoga, pilates and tai chi.  Demonstration and skill practice provided.   Stress and Anxiety: - Provides group verbal and written instruction about the health risks of elevated stress and causes of high stress.  Discuss the correlation between heart/lung disease and anxiety and treatment options. Review healthy ways to manage with stress and anxiety.   Depression: - Provides group verbal and written instruction on the correlation between heart/lung disease and depressed mood, treatment options, and the stigmas associated with seeking treatment.   Exercise & Equipment Safety: - Individual verbal instruction and demonstration of equipment use and safety with use of the equipment.   Pulmonary Rehab from 10/06/2017 in [facility] Cardiac and Pulmonary Rehab  Date  10/06/17  Educator  [facility]  Instruction Review Code  1- Verbalizes Understanding      Infection Prevention: - Provides verbal and written material to individual with discussion of infection control including proper hand washing and proper equipment cleaning during exercise session.   Pulmonary Rehab from 10/06/2017 in [facility] Cardiac and Pulmonary Rehab  Date  10/06/17  Educator  [facility]  Instruction Review Code  1- Verbalizes Understanding      Falls Prevention: - Provides verbal and written material to individual with discussion of falls prevention and safety.   Pulmonary Rehab from 10/06/2017  in [facility] Cardiac and Pulmonary Rehab  Date  10/06/17  Educator  [facility]  Instruction Review Code  1- Verbalizes Understanding      Diabetes: - Individual verbal and written instruction to review signs/symptoms of diabetes, desired ranges of glucose level fasting, after meals and with exercise. Advice that pre and post exercise glucose checks will be done for 3 sessions at entry of program.   Chronic Lung Diseases: - Group verbal and written instruction to review updates, respiratory medications, advancements in procedures and treatments. Discuss use of supplemental oxygen including available portable oxygen systems, continuous and intermittent flow rates, concentrators, personal use and safety guidelines. Review proper use of inhaler and spacers. Provide informative websites for self-education.    Energy Conservation: - Provide group verbal and written instruction for methods to conserve energy, plan and organize activities. Instruct on pacing techniques, use of adaptive equipment and posture/positioning to relieve shortness of breath.   Triggers and Exacerbations: - Group verbal and written instruction to review types of environmental triggers and ways to prevent exacerbations. Discuss weather changes, air quality and the benefits of nasal washing. Review warning signs and symptoms to help prevent infections. Discuss techniques for effective airway clearance, coughing, and vibrations.   AED/CPR: - Group verbal and written instruction with the use of models to demonstrate the basic use of the AED with the basic ABC's of resuscitation.   Anatomy and Physiology of the Lungs: - Group verbal and written instruction with the use of models to provide basic lung anatomy and physiology related to function, structure and complications of lung disease.   Anatomy & Physiology of the Heart: - Group verbal and written instruction and models provide basic cardiac anatomy and physiology, with the coronary  electrical and arterial systems. Review of Valvular disease and Heart Failure   Cardiac Medications: - Group verbal and written instruction to review commonly prescribed medications for heart disease. Reviews the medication, class of the drug, and side effects.   Know Your Numbers and Risk Factors: -Group verbal and written  instruction about important numbers in your health.  Discussion of what are risk factors and how they play a role in the disease process.  Review of Cholesterol, Blood Pressure, Diabetes, and BMI and the role they play in your overall health.   Sleep Hygiene: -Provides group verbal and written instruction about how sleep can affect your health.  Define sleep hygiene, discuss sleep cycles and impact of sleep habits. Review good sleep hygiene tips.    Other: -Provides group and verbal instruction on various topics (see comments)    Knowledge Questionnaire Score: Knowledge Questionnaire Score - 10/06/17 1405      Knowledge Questionnaire Score   Pre Score  12/18 Reviewed with patient        Core Components/Risk Factors/Patient Goals at Admission: Personal Goals and Risk Factors at Admission - 10/06/17 1431      Core Components/Risk Factors/Patient Goals on Admission    Weight Management  Yes;Weight Maintenance    Intervention  Weight Management: Develop a combined nutrition and exercise program designed to reach desired caloric intake, while maintaining appropriate intake of nutrient and fiber, sodium and fats, and appropriate energy expenditure required for the weight goal.;Weight Management: Provide education and appropriate resources to help participant work on and attain dietary goals.;Weight Management/Obesity: Establish reasonable short term and long term weight goals.    Admit Weight  155 lb 14.4 oz (70.7 kg)    Goal Weight: Short Term  155 lb (70.3 kg)    Goal Weight: Long Term  155 lb (70.3 kg)    Expected Outcomes  Short Term: Continue to assess and  modify interventions until short term weight is achieved;Long Term: Adherence to nutrition and physical activity/exercise program aimed toward attainment of established weight goal;Weight Maintenance: Understanding of the daily nutrition guidelines, which includes 25-35% calories from fat, 7% or less cal from saturated fats, less than 278m cholesterol, less than 1.5gm of sodium, & 5 or more servings of fruits and vegetables daily;Understanding recommendations for meals to include 15-35% energy as protein, 25-35% energy from fat, 35-60% energy from carbohydrates, less than 2046mof dietary cholesterol, 20-35 gm of total fiber daily;Understanding of distribution of calorie intake throughout the day with the consumption of 4-5 meals/snacks    Improve shortness of breath with ADL's  Yes    Intervention  Provide education, individualized exercise plan and daily activity instruction to help decrease symptoms of SOB with activities of daily living.    Expected Outcomes  Long Term: Be able to perform more ADLs without symptoms or delay the onset of symptoms;Short Term: Improve cardiorespiratory fitness to achieve a reduction of symptoms when performing ADLs       Core Components/Risk Factors/Patient Goals Review:    Core Components/Risk Factors/Patient Goals at Discharge (Final Review):    ITP Comments: ITP Comments    Row Name 10/06/17 1403           ITP Comments  Medical Evaluation completed. Chart sent for review and changes to Dr. KlCaryl Comesor Dr. MaEmily Filbertirector of [place]osis can be found in [facility] encounter 09/02/16          Comments: Initial ITP

## 2017-10-13 DIAGNOSIS — J449 Chronic obstructive pulmonary disease, unspecified: Secondary | ICD-10-CM

## 2017-10-13 NOTE — Progress Notes (Signed)
Daily Session Note  Patient Details  Name: Chris Case MRN: [id number] Date of Birth: 07/20/1970 Referring Provider:     Pulmonary Rehab from 10/06/2017 in [facility] Cardiac and Pulmonary Rehab  Referring Provider  Ancil Linsey MD      Encounter Date: 10/13/2017  Check In: Session Check In - 10/13/17 1151      Check-In   Supervising physician immediately available to respond to emergencies  [facility] immediately available ER MD    Physician(s)  Dr. Quentin Cornwall and Dr. Corky Downs    Location  [facility]-Cardiac & Pulmonary Rehab    Staff Present  Joellyn Rued, BS, PEC;Kelly [place], BS, ACSM CEP, Exercise Physiologist;Amanda Oletta Darter, [place], ACSM CEP, Exercise Physiologist    Medication changes reported      No    Fall or balance concerns reported     No    Warm-up and Cool-down  Performed as group-led instruction    Resistance Training Performed  No    VAD Patient?  No    PAD/SET Patient?  No      Pain Assessment   Currently in Pain?  No/denies    Multiple Pain Sites  No          Social History   Tobacco Use  Smoking Status Former Smoker  . Packs/day: 3.00  . Years: 20.00  . Pack years: 60.00  . Types: Cigarettes  . Last attempt to quit: 04/11/2016  . Years since quitting: 1.5  Smokeless Tobacco Never Used    Goals Met:  Proper associated with RPD/PD & O2 Sat Exercise tolerated well Strength training completed today  Goals Unmet:  Not Applicable  Comments: First full day of exercise!  Patient was oriented to gym and equipment including functions, settings, policies, and procedures.  [name] individual exercise prescription and treatment plan were reviewed.  All starting workloads were established based on the results of the 6 minute walk test done at initial orientation visit.  The plan for exercise progression was also introduced and progression will be customized based on patient's performance and goals.    Dr. Emily Filbert is Medical Director for [place] and [facility].

## 2017-10-15 ENCOUNTER — Encounter: Payer: Medicaid Other | Admitting: *Deleted

## 2017-10-15 DIAGNOSIS — J449 Chronic obstructive pulmonary disease, unspecified: Secondary | ICD-10-CM | POA: Diagnosis not present

## 2017-10-15 NOTE — Progress Notes (Signed)
Daily Session Note  Patient Details  Name: Chris Case MRN: [id number] Date of Birth: [birth year] Referring Provider:     Pulmonary Rehab from 10/06/2017 in [facility] Cardiac and Pulmonary Rehab  Referring Provider  Ancil Linsey MD      Encounter Date: 10/15/2017  Check In: Session Check In - 10/15/17 1203      Check-In   Supervising physician immediately available to respond to emergencies  [facility] immediately available ER MD    Physician(s)  Dr. Cinda Quest and Jimmye Norman    Location  [facility]-Cardiac & Pulmonary Rehab    Staff Present  Darel Hong, RN BSN;Jahson Emanuele Sherryll Burger, RN BSN;Jessica Luan Pulling, MA, RCEP, CCRP, Exercise Physiologist    Medication changes reported      No    Fall or balance concerns reported     No    Warm-up and Cool-down  Performed as group-led instruction    Resistance Training Performed  Yes    VAD Patient?  No      Pain Assessment   Currently in Pain?  No/denies          Social History   Tobacco Use  Smoking Status Former Smoker  . Packs/day: 3.00  . Years: 20.00  . Pack years: 60.00  . Types: Cigarettes  . Last attempt to quit: 04/11/2016  . Years since quitting: 1.5  Smokeless Tobacco Never Used    Goals Met:  Proper associated with RPD/PD & O2 Sat Independence with exercise equipment Using PLB without cueing & demonstrates good technique Exercise tolerated well No report of cardiac concerns or symptoms Strength training completed today  Goals Unmet:  Not Applicable  Comments: Pt able to follow exercise prescription today without complaint.  Will continue to monitor for progression.    Dr. Emily Filbert is Medical Director for [place] and [facility].

## 2017-10-17 ENCOUNTER — Encounter: Payer: Medicaid Other | Admitting: *Deleted

## 2017-10-17 DIAGNOSIS — J449 Chronic obstructive pulmonary disease, unspecified: Secondary | ICD-10-CM | POA: Diagnosis not present

## 2017-10-17 NOTE — Progress Notes (Signed)
Daily Session Note  Patient Details  Name: Chris Case MRN: [id number] Date of Birth: February 03, 1971 Referring Provider:     Pulmonary Rehab from 10/06/2017 in [facility] Cardiac and Pulmonary Rehab  Referring Provider  Ancil Linsey MD      Encounter Date: 10/17/2017  Check In: Session Check In - 10/17/17 1141      Check-In   Supervising physician immediately available to respond to emergencies  [facility] immediately available ER MD    Physician(s)  Drs. Saidecki and [facility], [facility] Pul[facility] Rehab    Staff Present  Carson Myrtle, BS, RRT, Respiratory Therapist;Meredith Sherryll Burger, RN BSN;Jakeia Carreras Luan Pulling, MA, RCEP, CCRP, Exercise Physiologist    Medication changes reported      No    Fall or balance concerns reported     No    Warm-up and Cool-down  Performed as group-led instruction    Resistance Training Performed  Yes    VAD Patient?  No    PAD/SET Patient?  No      Pain Assessment   Currently in Pain?  No/denies          Social History   Tobacco Use  Smoking Status Former Smoker  . Packs/day: 3.00  . Years: 20.00  . Pack years: 60.00  . Types: Cigarettes  . Last attempt to quit: 04/11/2016  . Years since quitting: 1.5  Smokeless Tobacco Never Used    Goals Met:  Proper associated with RPD/PD & O2 Sat Independence with exercise equipment Using PLB without cueing & demonstrates good technique Exercise tolerated well No report of cardiac concerns or symptoms Strength training completed today  Goals Unmet:  Not Applicable  Comments: Pt able to follow exercise prescription today without complaint.  Will continue to monitor for progression.    Dr. Emily Filbert is Medical Director for [place] and [facility].

## 2017-10-20 DIAGNOSIS — J449 Chronic obstructive pulmonary disease, unspecified: Secondary | ICD-10-CM | POA: Diagnosis not present

## 2017-10-20 NOTE — Progress Notes (Signed)
Daily Session Note  Patient Details  Name: Chris Case MRN: [id number] Date of Birth: Jul 10, 1970 Referring Provider:     Pulmonary Rehab from 10/06/2017 in [facility] Cardiac and Pulmonary Rehab  Referring Provider  Ancil Linsey MD      Encounter Date: 10/20/2017  Check In:      Social History   Tobacco Use  Smoking Status Former Smoker  . Packs/day: 3.00  . Years: 20.00  . Pack years: 60.00  . Types: Cigarettes  . Last attempt to quit: 04/11/2016  . Years since quitting: 1.5  Smokeless Tobacco Never Used    Goals Met:  Proper associated with RPD/PD & O2 Sat Independence with exercise equipment Exercise tolerated well Strength training completed today  Goals Unmet:  Not Applicable  Comments: Pt able to follow exercise prescription today without complaint.  Will continue to monitor for progression.    Dr. Emily Filbert is Medical Director for [place] and [facility].

## 2017-10-22 ENCOUNTER — Encounter: Payer: Medicaid Other | Admitting: *Deleted

## 2017-10-22 DIAGNOSIS — J449 Chronic obstructive pulmonary disease, unspecified: Secondary | ICD-10-CM | POA: Diagnosis not present

## 2017-10-22 NOTE — Progress Notes (Signed)
Daily Session Note  Patient Details  Name: Chris Case MRN: [id number] Date of Birth: 1970/11/12 Referring Provider:     Pulmonary Rehab from 10/06/2017 in [facility] Cardiac and Pulmonary Rehab  Referring Provider  Ancil Linsey MD      Encounter Date: 10/22/2017  Check In: Session Check In - 10/22/17 1112      Check-In   Supervising physician immediately available to respond to emergencies  [facility] immediately available ER MD    Physician(s)  Dr. Jimmye Norman and Mariea Clonts    Location  [facility]-Cardiac & Pulmonary Rehab    Staff Present  Alberteen Sam, MA, RCEP, CCRP, Exercise Physiologist;Joseph Alcus Dad, RN BSN    Medication changes reported      No    Fall or balance concerns reported     No    Warm-up and Cool-down  Performed as group-led Higher education careers adviser Performed  Yes    VAD Patient?  No      Pain Assessment   Currently in Pain?  No/denies          Social History   Tobacco Use  Smoking Status Former Smoker  . Packs/day: 3.00  . Years: 20.00  . Pack years: 60.00  . Types: Cigarettes  . Last attempt to quit: 04/11/2016  . Years since quitting: 1.5  Smokeless Tobacco Never Used    Goals Met:  Proper associated with RPD/PD & O2 Sat Independence with exercise equipment Using PLB without cueing & demonstrates good technique Exercise tolerated well No report of cardiac concerns or symptoms Strength training completed today  Goals Unmet:  Not Applicable  Comments: Pt able to follow exercise prescription today without complaint.  Will continue to monitor for progression.  Reviewed home exercise with pt today.  Pt plans to walk at home for exercise.  He is going to start with one day a week.  Reviewed THR, pulse, RPE, sign and symptoms, and when to call 911 or MD.  Also discussed weather considerations and indoor options.  Pt voiced understanding.   Dr. Emily Filbert is Medical Director for [place] and  [facility].

## 2017-10-24 DIAGNOSIS — J449 Chronic obstructive pulmonary disease, unspecified: Secondary | ICD-10-CM

## 2017-10-24 NOTE — Progress Notes (Signed)
Daily Session Note  Patient Details  Name: Chris Case MRN: [id number] Date of Birth: Sep 04, 1970 Referring Provider:     Pulmonary Rehab from 10/06/2017 in [facility] Cardiac and Pulmonary Rehab  Referring Provider  Ancil Linsey MD      Encounter Date: 10/24/2017  Check In: Session Check In - 10/24/17 1142      Check-In   Supervising physician immediately available to respond to emergencies  [facility] immediately available ER MD    Physician(s)  Dr. Corky Downs and Quentin Cornwall    Location  [facility]-Cardiac & Pulmonary Rehab    Staff Present  Alberteen Sam, MA, RCEP, CCRP, Exercise Physiologist;Meredith Sherryll Burger, RN BSN;Joseph Hood RCP,RRT,BSRT    Medication changes reported      No    Fall or balance concerns reported     No    Warm-up and Cool-down  Performed as group-led Higher education careers adviser Performed  Yes    VAD Patient?  No    PAD/SET Patient?  No      Pain Assessment   Currently in Pain?  No/denies          Social History   Tobacco Use  Smoking Status Former Smoker  . Packs/day: 3.00  . Years: 20.00  . Pack years: 60.00  . Types: Cigarettes  . Last attempt to quit: 04/11/2016  . Years since quitting: 1.5  Smokeless Tobacco Never Used    Goals Met:  Proper associated with RPD/PD & O2 Sat Independence with exercise equipment Using PLB without cueing & demonstrates good technique Exercise tolerated well No report of cardiac concerns or symptoms Strength training completed today  Goals Unmet:  Not Applicable  Comments: Pt able to follow exercise prescription today without complaint.  Will continue to monitor for progression.    Dr. Emily Filbert is Medical Director for [place] and [facility].

## 2017-10-27 DIAGNOSIS — J449 Chronic obstructive pulmonary disease, unspecified: Secondary | ICD-10-CM | POA: Diagnosis not present

## 2017-10-27 NOTE — Progress Notes (Signed)
Pulmonary Individual Treatment Plan  Patient Details  Name: Chris Case MRN: [id number] Date of Birth: 07-22-1970 Referring Provider:     Pulmonary Rehab from 10/06/2017 in [facility] Cardiac and Pulmonary Rehab  Referring Provider  Ancil Linsey MD      Initial Encounter Date:    Pulmonary Rehab from 10/06/2017 in [facility] Cardiac and Pulmonary Rehab  Date  10/06/17      Visit Diagnosis: Chronic obstructive pulmonary disease, unspecified COPD type ([place])  [name] Home Medications on Admission:  Current Outpatient Medications:  .  albuterol (PROVENTIL) (2.5 MG/3ML) 0.083% nebulizer solution, Inhale 3 mLs into the lungs every 4 (four) hours as needed for wheezing or shortness of breath., Disp: 75 mL, Rfl: 12 .  aspirin EC 81 MG EC tablet, Take 1 tablet (81 mg total) by mouth daily., Disp: 30 tablet, Rfl: 6 .  furosemide (LASIX) 20 MG tablet, Take 20 mg by mouth daily., Disp: , Rfl: 0 .  mometasone-formoterol (DULERA) 200-5 MCG/ACT AERO, Inhale 2 puffs into the lungs 2 (two) times daily., Disp: 1 Inhaler, Rfl: 5 .  OXYGEN, Inhale 2 L into the lungs., Disp: , Rfl:   Past Medical History: Past Medical History:  Diagnosis Date  . COPD (chronic obstructive pulmonary disease) ([place])   . Emphysema lung ([place])   . 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 Packs/day: 3.00  . Years: 20.00  . Pack years: 60.00  . Types: Cigarettes  . Last attempt to quit: 04/11/2016  . Years since quitting: 1.5  Smokeless Tobacco Never Used    Labs: Recent Review Flowsheet Data    There is no flowsheet data to display.       Pulmonary Assessment Scores: Pulmonary Assessment Scores    Row Name 10/06/17 1404         ADL UCSD   ADL Phase  Entry     SOB Score total  47     Rest  4     Walk  3     Stairs  2     Bath  2     Dress  2     Shop  2       CAT Score   CAT Score  22       mMRC Score    mMRC Score  2        Pulmonary Function Assessment: Pulmonary Function Assessment - 10/06/17 1408      Initial Spirometry Results   FVC%  30 %   Test done on 06/19/16 Care Everywhere   FEV1%  17 %    FEV1/FVC Ratio  46      Post Bronchodilator Spirometry Results   FVC%  35 %   Test done on 06/19/16 Care Everywhere   FEV1%  20 %    FEV1/FVC Ratio  46      Breath   Bilateral Breath Sounds  Decreased    Shortness of Breath  Yes;Panic with Shortness of Breath;Limiting activity       Exercise Target Goals: Exercise Program Goal: Individual exercise prescription set using results from initial 6 min walk test and THRR while considering  patient's activity barriers and safety.   Exercise Prescription Goal: Initial exercise prescription builds to 30-45 minutes a day of aerobic activity, 2-3 days per week.  Home exercise guidelines will be given to patient during program as  part of exercise prescription that the participant will acknowledge.  Activity Barriers & Risk Stratification: Activity Barriers & Cardiac Risk Stratification - 10/06/17 1422      Activity Barriers & Cardiac Risk Stratification   Activity Barriers  Deconditioning;Shortness of Breath;Muscular Weakness       6 Minute Walk: 6 Minute Walk    Row Name 10/06/17 1420         6 Minute Walk   Phase  Initial     Distance  920 feet     Walk Time  6 minutes     # of Rest Breaks  0     MPH  1.74     METS  3.75     RPE  11     Perceived Dyspnea   2     VO2 Peak  13.14     Symptoms  Yes (comment)     Comments  SOB     Resting HR  78 bpm     Resting BP  130/60     Resting Oxygen Saturation   96 %     Exercise Oxygen Saturation  during 6 min walk  85 %     Max Ex. HR  102 bpm     Max Ex. BP  138/74     2 Minute Post BP  132/74       Interval HR   1 Minute HR  93     2 Minute HR  94     3 Minute HR  96     4 Minute HR  100     5 Minute HR  101     6 Minute HR  102     2 Minute Post HR  87     Interval  Heart Rate?  Yes       Interval Oxygen   Interval Oxygen?  Yes     Baseline Oxygen Saturation %  96 %     1 Minute Oxygen Saturation %  92 %     1 Minute Liters of Oxygen  3 L     2 Minute Oxygen Saturation %  92 % 2:33 83%     2 Minute Liters of Oxygen  3 L     3 Minute Oxygen Saturation %  88 %     3 Minute Liters of Oxygen  3 L     4 Minute Oxygen Saturation %  86 %     4 Minute Liters of Oxygen  3 L     5 Minute Oxygen Saturation %  86 %     5 Minute Liters of Oxygen  3 L     6 Minute Oxygen Saturation %  86 % 6:05 85%     6 Minute Liters of Oxygen  3 L     2 Minute Post Oxygen Saturation %  94 %     2 Minute Post Liters of Oxygen  3 L       Oxygen Initial Assessment: Oxygen Initial Assessment - 10/06/17 1430      Home Oxygen   Home Oxygen Device  Home Concentrator;E-Tanks    Sleep Oxygen Prescription  Continuous    Liters per minute  2    Home Exercise Oxygen Prescription  Continuous    Liters per minute  3    Home at Rest Exercise Oxygen Prescription  Continuous    Liters per minute  3    Compliance with Home Oxygen Use  Yes      Initial 6 min Walk   Oxygen Used  Continuous    Liters per minute  3      Program Oxygen Prescription   Program Oxygen Prescription  Continuous    Liters per minute  3      Intervention   Short Term Goals  To learn and exhibit compliance with exercise, home and travel O2 prescription;To learn and understand importance of monitoring SPO2 with pulse oximeter and demonstrate accurate use of the pulse oximeter.;To learn and understand importance of maintaining oxygen saturations>88%;To learn and demonstrate proper pursed lip breathing techniques or other breathing techniques.;To learn and demonstrate proper use of respiratory medications    Long  Term Goals  Exhibits compliance with exercise, home and travel O2 prescription;Verbalizes importance of monitoring SPO2 with pulse oximeter and return demonstration;Maintenance of O2  saturations>88%;Exhibits proper breathing techniques, such as pursed lip breathing or other method taught during program session;Compliance with respiratory medication;Demonstrates proper use of MDI's       Oxygen Re-Evaluation: Oxygen Re-Evaluation    Row Name 10/13/17 1605             Goals/Expected Outcomes   Short Term Goals  To learn and understand importance of monitoring SPO2 with pulse oximeter and demonstrate accurate use of the pulse oximeter.;To learn and understand importance of maintaining oxygen saturations>88%;To learn and demonstrate proper pursed lip breathing techniques or other breathing techniques.       Long  Term Goals  Verbalizes importance of monitoring SPO2 with pulse oximeter and return demonstration;Maintenance of O2 saturations>88%;Exhibits proper breathing techniques, such as pursed lip breathing or other method taught during program session       Comments  Reviewed PLB technique with pt.  Talked about how it work and it's important to maintaining his exercise saturations.         Goals/Expected Outcomes  Short: Become more profiecient at using PLB.   Long: Become independent at using PLB.          Oxygen Discharge (Final Oxygen Re-Evaluation): Oxygen Re-Evaluation - 10/13/17 1605      Goals/Expected Outcomes   Short Term Goals  To learn and understand importance of monitoring SPO2 with pulse oximeter and demonstrate accurate use of the pulse oximeter.;To learn and understand importance of maintaining oxygen saturations>88%;To learn and demonstrate proper pursed lip breathing techniques or other breathing techniques.    Long  Term Goals  Verbalizes importance of monitoring SPO2 with pulse oximeter and return demonstration;Maintenance of O2 saturations>88%;Exhibits proper breathing techniques, such as pursed lip breathing or other method taught during program session    Comments  Reviewed PLB technique with pt.  Talked about how it work and it's important to  maintaining his exercise saturations.      Goals/Expected Outcomes  Short: Become more profiecient at using PLB.   Long: Become independent at using PLB.       Initial Exercise Prescription: Initial Exercise Prescription - 10/06/17 1400      Date of Initial Exercise RX and Referring Provider   Date  10/06/17    Referring Provider  Ancil Linsey MD      Oxygen   Oxygen  Continuous    Liters  3      Treadmill   MPH  1.7    Grade  0    Minutes  15    METs  2.3      REL-XR   Level  3    Speed  50  Minutes  15    METs  2.3      T5 Nustep   Level  3    SPM  80    Minutes  15    METs  2.3      Prescription Details   Frequency (times per week)  3    Duration  Progress to 45 minutes of aerobic exercise without signs/symptoms of physical distress      Intensity   THRR 40-80% of Max Heartrate  116-154    Ratings of Perceived Exertion  11-13    Perceived Dyspnea  0-4      Progression   Progression  Continue to progress workloads to maintain intensity without signs/symptoms of physical distress.      Resistance Training   Training Prescription  Yes    Weight  3 lbs    Reps  10-15       Perform Capillary Blood Glucose checks as needed.  Exercise Prescription Changes: Exercise Prescription Changes    Row Name 10/06/17 1400 10/14/17 1300 10/22/17 1200         Response to Exercise   Blood Pressure (Admit)  130/60  120/74  -     Blood Pressure (Exercise)  138/74  138/82  -     Blood Pressure (Exit)  132/74  120/68  -     Heart Rate (Admit)  78 bpm  76 bpm  -     Heart Rate (Exercise)  102 bpm  105 bpm  -     Heart Rate (Exit)  87 bpm  90 bpm  -     Oxygen Saturation (Admit)  96 %  92 %  -     Oxygen Saturation (Exercise)  85 %  92 %  -     Oxygen Saturation (Exit)  94 %  99 %  -     Rating of Perceived Exertion (Exercise)  11  13  -     Perceived Dyspnea (Exercise)  2  3  -     Symptoms  SOB  SOB  -     Comments  walk test results  first full day of  exercise  -     Duration  -  Progress to 45 minutes of aerobic exercise without signs/symptoms of physical distress  -     Intensity  -  THRR unchanged  -       Progression   Progression  -  Continue to progress workloads to maintain intensity without signs/symptoms of physical distress.  -     Average METs  -  2.9  -       Resistance Training   Training Prescription  -  Yes  -     Weight  -  3 lbs  -     Reps  -  10-15  -       Interval Training   Interval Training  -  No  -       Oxygen   Oxygen  -  Continuous  -     Liters  -  3  -       Treadmill   MPH  -  1.7  -     Grade  -  0  -     Minutes  -  15  -     METs  -  2.3  -       REL-XR   Level  -  3  -  Minutes  -  15  -     METs  -  3.5  -       T5 Nustep   Level  -  3  -     Minutes  -  15  -       Home Exercise Plan   Plans to continue exercise at  -  -  Home (comment) walking     Frequency  -  -  Add 1 additional day to program exercise sessions.     Initial Home Exercises Provided  -  -  10/22/17        Exercise Comments: Exercise Comments    Row Name 10/13/17 1508           Exercise Comments  First full day of exercise!  Patient was oriented to gym and equipment including functions, settings, policies, and procedures.  [name] individual exercise prescription and treatment plan were reviewed.  All starting workloads were established based on the results of the 6 minute walk test done at initial orientation visit.  The plan for exercise progression was also introduced and progression will be customized based on patient's performance and goals.          Exercise Goals and Review: Exercise Goals    Row Name 10/06/17 1424             Exercise Goals   Increase Physical Activity  Yes       Intervention  Provide advice, education, support and counseling about physical activity/exercise needs.;Develop an individualized exercise prescription for aerobic and resistive training based on initial  evaluation findings, risk stratification, comorbidities and participant's personal goals.       Expected Outcomes  Short Term: Attend rehab on a regular basis to increase amount of physical activity.;Long Term: Add in home exercise to make exercise part of routine and to increase amount of physical activity.;Long Term: Exercising regularly at least 3-5 days a week.       Increase Strength and Stamina  Yes       Intervention  Provide advice, education, support and counseling about physical activity/exercise needs.;Develop an individualized exercise prescription for aerobic and resistive training based on initial evaluation findings, risk stratification, comorbidities and participant's personal goals.       Expected Outcomes  Short Term: Increase workloads from initial exercise prescription for resistance, speed, and METs.;Short Term: Perform resistance training exercises routinely during rehab and add in resistance training at home;Long Term: Improve cardiorespiratory fitness, muscular endurance and strength as measured by increased METs and functional capacity (6MWT)       Able to understand and use rate of perceived exertion (RPE) scale  Yes       Intervention  Provide education and explanation on how to use RPE scale       Expected Outcomes  Short Term: Able to use RPE daily in rehab to express subjective intensity level;Long Term:  Able to use RPE to guide intensity level when exercising independently       Able to understand and use Dyspnea scale  Yes       Intervention  Provide education and explanation on how to use Dyspnea scale       Expected Outcomes  Short Term: Able to use Dyspnea scale daily in rehab to express subjective sense of shortness of breath during exertion;Long Term: Able to use Dyspnea scale to guide intensity level when exercising independently       Knowledge and understanding of Target  Heart Rate Range (THRR)  Yes       Intervention  Provide education and explanation of THRR  including how the numbers were predicted and where they are located for reference       Expected Outcomes  Short Term: Able to state/look up THRR;Short Term: Able to use daily as guideline for intensity in rehab;Long Term: Able to use THRR to govern intensity when exercising independently       Able to check pulse independently  Yes       Intervention  Provide education and demonstration on how to check pulse in carotid and radial arteries.;Review the importance of being able to check your own pulse for safety during independent exercise       Expected Outcomes  Short Term: Able to explain why pulse checking is important during independent exercise;Long Term: Able to check pulse independently and accurately       Understanding of Exercise Prescription  Yes       Intervention  Provide education, explanation, and written materials on patient's individual exercise prescription       Expected Outcomes  Short Term: Able to explain program exercise prescription;Long Term: Able to explain home exercise prescription to exercise independently          Exercise Goals Re-Evaluation : Exercise Goals Re-Evaluation    Row Name 10/13/17 1508 10/22/17 1233           Exercise Goal Re-Evaluation   Exercise Goals Review  Increase Physical Activity;Increase Strength and Stamina;Able to understand and use rate of perceived exertion (RPE) scale;Able to understand and use Dyspnea scale  Increase Physical Activity;Increase Strength and Stamina;Understanding of Exercise Prescription      Comments  Reviewed RPE scale, THR and program prescription with pt today.  Pt voiced understanding and was given a copy of goals to take home.   Reviewed home exercise with pt today.  Pt plans to walk at home for exercise.  He is going to start with one day a week.  Reviewed THR, pulse, RPE, sign and symptoms, and when to call 911 or MD.  Also discussed weather considerations and indoor options.  Pt voiced understanding.      Expected  Outcomes  Short: Use RPE daily to regulate intensity. Long: Follow program prescription in THR.  Short: Add in at least one extra day a week.  Long: Continue to exercise independently         Discharge Exercise Prescription (Final Exercise Prescription Changes): Exercise Prescription Changes - 10/22/17 1200      Home Exercise Plan   Plans to continue exercise at  Home (comment)   walking   Frequency  Add 1 additional day to program exercise sessions.    Initial Home Exercises Provided  10/22/17       Nutrition:  Target Goals: Understanding of nutrition guidelines, daily intake of sodium <1575m, cholesterol <2069m calories 30% from fat and 7% or less from saturated fats, daily to have 5 or more servings of fruits and vegetables.  Biometrics: Pre Biometrics - 10/06/17 1424      Pre Biometrics   Height  5' 7.2" (1.707 m)    Weight  155 lb 14.4 oz (70.7 kg)    Waist Circumference  35 inches    Hip Circumference  37 inches    Waist to Hip Ratio  0.95 %    BMI (Calculated)  24.27        Nutrition Therapy Plan and Nutrition Goals: Nutrition Therapy & Goals -  10/22/17 1238      Nutrition Therapy   Diet  TLC    Protein (specify units)  9oz    Fiber  30 grams   likely not meeting fiber goal   Whole Grain Foods  3 servings   does not usually choose whole grains   Saturated Fats  16 max. grams    Fruits and Vegetables  5 servings/day   8 ideal; does not eat fruit daily    Sodium  2000 grams   does not monitor sodium intake     Personal Nutrition Goals   Nutrition Goal  Start to pay attention to the sodium content in foods, particularly for pre-prepared meals and canned items. Choose foods with less than 352m of sodium    Personal Goal #2  Increase water consumption, start by swapping one soda for a glass of water per day    Personal Goal #3  Look in the frozen section for vegetables and beans, rather than choosing canned. They can be a cheap and quick-cook option that you  can add as a side to any meal    Comments  He is looking to maintain is CBW. Eats several processed snack foods daily (usually in place of dinner), eats fried foods, does not monitor salt, and most meals consumed are ready-prepared/ frozen      Intervention Plan   Intervention  Prescribe, educate and counsel regarding individualized specific dietary modifications aiming towards targeted core components such as weight, hypertension, lipid management, diabetes, heart failure and other comorbidities.;Nutrition handout(s) given to patient.   nutrition guidelines for COPD handout   Expected Outcomes  Short Term Goal: Understand basic principles of dietary content, such as calories, fat, sodium, cholesterol and nutrients.;Short Term Goal: A plan has been developed with personal nutrition goals set during dietitian appointment.;Long Term Goal: Adherence to prescribed nutrition plan.       Nutrition Assessments:   Nutrition Goals Re-Evaluation: Nutrition Goals Re-Evaluation    RKanabecName 10/22/17 1245             Goals   Nutrition Goal  Start to pay attention to the sodium content in foods, particularly for pre-prepared meals and canned items. Choose foods with less than 3034mof sodium       Comment  Currently he does not pay attention the sodium content in foods. His diet is high in pre-packaged foods, canned items, frozen meals, fried foods       Expected Outcome  He will start to read food labels and become more aware of the salt in foods he consumes. Ideally, he will choose foods with less than 30061mf sodium         Personal Goal #2 Re-Evaluation   Personal Goal #2  Increase water consumption, start by swapping one soda for a glass of water per day         Personal Goal #3 Re-Evaluation   Personal Goal #3  Look in the frozen section for vegetables and beans, rather than choosing canned. They can be a cheap and quick- cook option that you can add as a side to any meal          Nutrition  Goals Discharge (Final Nutrition Goals Re-Evaluation): Nutrition Goals Re-Evaluation - 10/22/17 1245      Goals   Nutrition Goal  Start to pay attention to the sodium content in foods, particularly for pre-prepared meals and canned items. Choose foods with less than 300m70m sodium  Comment  Currently he does not pay attention the sodium content in foods. His diet is high in pre-packaged foods, canned items, frozen meals, fried foods    Expected Outcome  He will start to read food labels and become more aware of the salt in foods he consumes. Ideally, he will choose foods with less than 374m of sodium      Personal Goal #2 Re-Evaluation   Personal Goal #2  Increase water consumption, start by swapping one soda for a glass of water per day      Personal Goal #3 Re-Evaluation   Personal Goal #3  Look in the frozen section for vegetables and beans, rather than choosing canned. They can be a cheap and quick- cook option that you can add as a side to any meal       Psychosocial: Target Goals: Acknowledge presence or absence of significant depression and/or stress, maximize coping skills, provide positive support system. Participant is able to verbalize types and ability to use techniques and skills needed for reducing stress and depression.   Initial Review & Psychosocial Screening: Initial Psych Review & Screening - 10/06/17 1421      Initial Review   Current issues with  Current Stress Concerns    Source of Stress Concerns  Chronic Illness    Comments  His breathing is his main concern      Family Dynamics   Good Support System?  Yes    Comments  His oldest sister is good for support.      Barriers   Psychosocial barriers to participate in program  The patient should benefit from training in stress management and relaxation.      Screening Interventions   Interventions  Encouraged to exercise;To provide support and resources with identified psychosocial needs;Program counselor  consult;Provide feedback about the scores to participant    Expected Outcomes  Short Term goal: Utilizing psychosocial counselor, staff and physician to assist with identification of specific Stressors or current issues interfering with healing process. Setting desired goal for each stressor or current issue identified.;Long Term Goal: Stressors or current issues are controlled or eliminated.;Short Term goal: Identification and review with participant of any Quality of Life or Depression concerns found by scoring the questionnaire.;Long Term goal: The participant improves quality of Life and PHQ9 Scores as seen by post scores and/or verbalization of changes       Quality of Life Scores:  Scores of 19 and below usually indicate a poorer quality of life in these areas.  A difference of  2-3 points is a clinically meaningful difference.  A difference of 2-3 points in the total score of the Quality of Life Index has been associated with significant improvement in overall quality of life, self-image, physical symptoms, and general health in studies assessing change in quality of life.  PHQ-9: Recent Review Flowsheet Data    Depression screen [facility] 10/06/2017   Decreased Interest 3   Down, Depressed, Hopeless 3   PHQ - 2 Score 6   Altered sleeping 0   Tired, decreased energy 2   Change in appetite 0   Feeling bad or failure about yourself  0   Trouble concentrating 0   Moving slowly or fidgety/restless 0   Suicidal thoughts 0   PHQ-9 Score 8   Difficult doing work/chores Somewhat difficult     Interpretation of Total Score  Total Score Depression Severity:  1-4 = Minimal depression, 5-9 = Mild depression, 10-14 = Moderate depression, 15-19 =  Moderately severe depression, 20-27 = Severe depression   Psychosocial Evaluation and Intervention: Psychosocial Evaluation - 10/22/17 1238      Psychosocial Evaluation & Interventions   Interventions  Stress management education;Encouraged to  exercise with the program and follow exercise prescription    Comments  Counselor met with Mr. Cervi Dancy) today for initial psychosocial evaluation.  He is a 47 year old who has been diagnosed with COPD.  He has a limited support system with a sister in [place] and he lives with a friend.   Elford reports sleeping well and has a good appetite.  He denies a history of depression or anxiety or any current symptoms and is typically in a positive mood.  Askia states he was a little depressed initially when he was put on oxygen full-time and lost interest in going places or doing things he used to enjoy.  But, he has since grown to accept this and is working towards getting off the oxygen by coming to this class and learning how to conserve energy; pace himself and used pursed lip breathing.  He reports progress already seen in a few short weeks with being able to park his truck and walk into the class without use of valet parking or being put in a wheel chair.  Counselor commended him on his progress made and his commitment to his goals.  Staff will follow with him.     Expected Outcomes  Short:  Sandy will continue to exercise consistently and walk in and out to his truck without assistance.   Long:  Bomani will develop strategies to breathe without full time oxygen; including consistent exercise.      Continue Psychosocial Services   Follow up required by staff       Psychosocial Re-Evaluation:   Psychosocial Discharge (Final Psychosocial Re-Evaluation):   Education: Education Goals: Education classes will be provided on a weekly basis, covering required topics. Participant will state understanding/return demonstration of topics presented.  Learning Barriers/Preferences: Learning Barriers/Preferences - 10/06/17 1426      Learning Barriers/Preferences   Learning Barriers  None    Learning Preferences  None       Education Topics:  Initial Evaluation Education: - Verbal, written and  demonstration of respiratory meds, oximetry and breathing techniques. Instruction on use of nebulizers and MDIs and importance of monitoring MDI activations.   Pulmonary Rehab from 10/22/2017 in [facility] Cardiac and Pulmonary Rehab  Date  10/06/17  Educator  [facility]  Instruction Review Code  1- Verbalizes Understanding      General Nutrition Guidelines/Fats and Fiber: -Group instruction provided by verbal, written material, models and posters to present the general guidelines for heart healthy nutrition. Gives an explanation and review of dietary fats and fiber.   Controlling Sodium/Reading Food Labels: -Group verbal and written material supporting the discussion of sodium use in heart healthy nutrition. Review and explanation with models, verbal and written materials for utilization of the food label.   Exercise Physiology & General Exercise Guidelines: - Group verbal and written instruction with models to review the exercise physiology of the cardiovascular system and associated critical values. Provides general exercise guidelines with specific guidelines to those with heart or lung disease.    Aerobic Exercise & Resistance Training: - Gives group verbal and written instruction on the various components of exercise. Focuses on aerobic and resistive training programs and the benefits of this training and how to safely progress through these programs.   Flexibility, Balance, Mind/Body Relaxation: Provides group  verbal/written instruction on the benefits of flexibility and balance training, including mind/body exercise modes such as yoga, pilates and tai chi.  Demonstration and skill practice provided.   Stress and Anxiety: - Provides group verbal and written instruction about the health risks of elevated stress and causes of high stress.  Discuss the correlation between heart/lung disease and anxiety and treatment options. Review healthy ways to manage with stress and anxiety.   Depression: -  Provides group verbal and written instruction on the correlation between heart/lung disease and depressed mood, treatment options, and the stigmas associated with seeking treatment.   Exercise & Equipment Safety: - Individual verbal instruction and demonstration of equipment use and safety with use of the equipment.   Pulmonary Rehab from 10/22/2017 in [facility] Cardiac and Pulmonary Rehab  Date  10/06/17  Educator  [facility]  Instruction Review Code  1- Verbalizes Understanding      Infection Prevention: - Provides verbal and written material to individual with discussion of infection control including proper hand washing and proper equipment cleaning during exercise session.   Pulmonary Rehab from 10/22/2017 in [facility] Cardiac and Pulmonary Rehab  Date  10/06/17  Educator  [facility]  Instruction Review Code  1- Verbalizes Understanding      Falls Prevention: - Provides verbal and written material to individual with discussion of falls prevention and safety.   Pulmonary Rehab from 10/22/2017 in [facility] Cardiac and Pulmonary Rehab  Date  10/06/17  Educator  [facility]  Instruction Review Code  1- Verbalizes Understanding      Diabetes: - Individual verbal and written instruction to review signs/symptoms of diabetes, desired ranges of glucose level fasting, after meals and with exercise. Advice that pre and post exercise glucose checks will be done for 3 sessions at entry of program.   Chronic Lung Diseases: - Group verbal and written instruction to review updates, respiratory medications, advancements in procedures and treatments. Discuss use of supplemental oxygen including available portable oxygen systems, continuous and intermittent flow rates, concentrators, personal use and safety guidelines. Review proper use of inhaler and spacers. Provide informative websites for self-education.    Energy Conservation: - Provide group verbal and written instruction for methods to conserve energy, plan and organize  activities. Instruct on pacing techniques, use of adaptive equipment and posture/positioning to relieve shortness of breath.   Pulmonary Rehab from 10/22/2017 in [facility] Cardiac and Pulmonary Rehab  Date  10/22/17  Educator  [facility]  Instruction Review Code  1- Verbalizes Understanding      Triggers and Exacerbations: - Group verbal and written instruction to review types of environmental triggers and ways to prevent exacerbations. Discuss weather changes, air quality and the benefits of nasal washing. Review warning signs and symptoms to help prevent infections. Discuss techniques for effective airway clearance, coughing, and vibrations.   AED/CPR: - Group verbal and written instruction with the use of models to demonstrate the basic use of the AED with the basic ABC's of resuscitation.   Pulmonary Rehab from 10/22/2017 in [facility] Cardiac and Pulmonary Rehab  Date  10/17/17  Educator  [facility]  Instruction Review Code  1- Actuary and Physiology of the Lungs: - Group verbal and written instruction with the use of models to provide basic lung anatomy and physiology related to function, structure and complications of lung disease.   Anatomy & Physiology of the Heart: - Group verbal and written instruction and models provide basic cardiac anatomy and physiology, with the coronary electrical and  arterial systems. Review of Valvular disease and Heart Failure   Cardiac Medications: - Group verbal and written instruction to review commonly prescribed medications for heart disease. Reviews the medication, class of the drug, and side effects.   Pulmonary Rehab from 10/22/2017 in [facility] Cardiac and Pulmonary Rehab  Date  10/15/17  Educator  [facility]  Instruction Review Code  1- Verbalizes Understanding      Know Your Numbers and Risk Factors: -Group verbal and written instruction about important numbers in your health.  Discussion of what are risk factors and how they play a role in  the disease process.  Review of Cholesterol, Blood Pressure, Diabetes, and BMI and the role they play in your overall health.   Sleep Hygiene: -Provides group verbal and written instruction about how sleep can affect your health.  Define sleep hygiene, discuss sleep cycles and impact of sleep habits. Review good sleep hygiene tips.    Other: -Provides group and verbal instruction on various topics (see comments)    Knowledge Questionnaire Score: Knowledge Questionnaire Score - 10/06/17 1405      Knowledge Questionnaire Score   Pre Score  12/18   Reviewed with patient       Core Components/Risk Factors/Patient Goals at Admission: Personal Goals and Risk Factors at Admission - 10/06/17 1431      Core Components/Risk Factors/Patient Goals on Admission    Weight Management  Yes;Weight Maintenance    Intervention  Weight Management: Develop a combined nutrition and exercise program designed to reach desired caloric intake, while maintaining appropriate intake of nutrient and fiber, sodium and fats, and appropriate energy expenditure required for the weight goal.;Weight Management: Provide education and appropriate resources to help participant work on and attain dietary goals.;Weight Management/Obesity: Establish reasonable short term and long term weight goals.    Admit Weight  155 lb 14.4 oz (70.7 kg)    Goal Weight: Short Term  155 lb (70.3 kg)    Goal Weight: Long Term  155 lb (70.3 kg)    Expected Outcomes  Short Term: Continue to assess and modify interventions until short term weight is achieved;Long Term: Adherence to nutrition and physical activity/exercise program aimed toward attainment of established weight goal;Weight Maintenance: Understanding of the daily nutrition guidelines, which includes 25-35% calories from fat, 7% or less cal from saturated fats, less than 263m cholesterol, less than 1.5gm of sodium, & 5 or more servings of fruits and vegetables daily;Understanding  recommendations for meals to include 15-35% energy as protein, 25-35% energy from fat, 35-60% energy from carbohydrates, less than 2010mof dietary cholesterol, 20-35 gm of total fiber daily;Understanding of distribution of calorie intake throughout the day with the consumption of 4-5 meals/snacks    Improve shortness of breath with ADL's  Yes    Intervention  Provide education, individualized exercise plan and daily activity instruction to help decrease symptoms of SOB with activities of daily living.    Expected Outcomes  Long Term: Be able to perform more ADLs without symptoms or delay the onset of symptoms;Short Term: Improve cardiorespiratory fitness to achieve a reduction of symptoms when performing ADLs       Core Components/Risk Factors/Patient Goals Review:    Core Components/Risk Factors/Patient Goals at Discharge (Final Review):    ITP Comments: ITP Comments    Row Name 10/06/17 1403 10/27/17 0835         ITP Comments  Medical Evaluation completed. Chart sent for review and changes to Dr. KlCaryl Comesor Dr. MaEmily Filbertirector of  [facility]. Diagnosis can be found in [facility] encounter 09/02/16  30 day review completed. ITP sent to Dr. Emily Filbert Director of [place]. Continue with ITP unless changes are made by physician         Comments: 30 day review

## 2017-10-27 NOTE — Progress Notes (Signed)
Daily Session Note  Patient Details  Name: Chris Case MRN: [id number] Date of Birth: 08/22/1970 Referring Provider:     Pulmonary Rehab from 10/06/2017 in [facility] Cardiac and Pulmonary Rehab  Referring Provider  Fleming, [place] MD      Encounter Date: 10/27/2017  Check In:      Social History   Tobacco Use  Smoking Status Former Smoker  . Packs/day: 3.00  . Years: 20.00  . Pack years: 60.00  . Types: Cigarettes  . Last attempt to quit: 04/11/2016  . Years since quitting: 1.5  Smokeless Tobacco Never Used    Goals Met:  Proper associated with RPD/PD & O2 Sat Independence with exercise equipment Exercise tolerated well Strength training completed today  Goals Unmet:  Not Applicable  Comments: Pt able to follow exercise prescription today without complaint.  Will continue to monitor for progression.    Dr. Mark Miller is Medical Director for [facility] and [facility]. 

## 2017-10-31 DIAGNOSIS — J449 Chronic obstructive pulmonary disease, unspecified: Secondary | ICD-10-CM

## 2017-10-31 NOTE — Progress Notes (Signed)
Daily Session Note  Patient Details  Name: Chris Case MRN: [id number] Date of Birth: 29-Jul-1970 Referring Provider:     Pulmonary Rehab from 10/06/2017 in [facility] Cardiac and Pulmonary Rehab  Referring Provider  Ancil Linsey MD      Encounter Date: 10/31/2017  Check In: Session Check In - 10/31/17 1206      Check-In   Supervising physician immediately available to respond to emergencies  [facility] immediately available ER MD    Physician(s)   Dr. Alfred Levins and [facility]    Location  [facility]-Cardiac & Pulmonary Rehab    Staff Present  Justin Mend Lorre Nick, [place], RCEP, CCRP, Exercise Physiologist    Medication changes reported      No    Fall or balance concerns reported     No    Warm-up and Cool-down  Performed as group-led instruction    Resistance Training Performed  Yes    VAD Patient?  No      Pain Assessment   Currently in Pain?  No/denies          Social History   Tobacco Use  Smoking Status Former Smoker  . Packs/day: 3.00  . Years: 20.00  . Pack years: 60.00  . Types: Cigarettes  . Last attempt to quit: 04/11/2016  . Years since quitting: 1.5  Smokeless Tobacco Never Used    Goals Met:  Independence with exercise equipment Exercise tolerated well No report of cardiac concerns or symptoms Strength training completed today  Goals Unmet:  Not Applicable  Comments: Pt able to follow exercise prescription today without complaint.  Will continue to monitor for progression.   Dr. Emily Filbert is Medical Director for [place] and [facility].

## 2017-11-05 ENCOUNTER — Encounter: Payer: Medicaid Other | Attending: Specialist

## 2017-11-05 DIAGNOSIS — J449 Chronic obstructive pulmonary disease, unspecified: Secondary | ICD-10-CM | POA: Insufficient documentation

## 2017-11-07 ENCOUNTER — Encounter: Payer: Medicaid Other | Admitting: *Deleted

## 2017-11-07 DIAGNOSIS — J449 Chronic obstructive pulmonary disease, unspecified: Secondary | ICD-10-CM | POA: Diagnosis present

## 2017-11-07 NOTE — Progress Notes (Signed)
Daily Session Note  Patient Details  Name: Ryne Mctigue MRN: [id number] Date of Birth: 1970/08/22 Referring Provider:     Pulmonary Rehab from 10/06/2017 in [facility] Cardiac and Pulmonary Rehab  Referring Provider  Ancil Linsey MD      Encounter Date: 11/07/2017  Check In: Session Check In - 11/07/17 1133      Check-In   Supervising physician immediately available to respond to emergencies  [facility] immediately available ER MD    Physician(s)  Dr. Corky Downs and [facility]    Location  [facility]-Cardiac & Pulmonary Rehab    Staff Present  Renita Papa, RN BSN;Jessica Luan Pulling, MA, RCEP, CCRP, Exercise Physiologist;Joseph Tessie Fass RCP,RRT,BSRT    Medication changes reported      No    Fall or balance concerns reported     No    Warm-up and Cool-down  Performed as group-led instruction    Resistance Training Performed  Yes    VAD Patient?  No    PAD/SET Patient?  No      Pain Assessment   Currently in Pain?  No/denies          Social History   Tobacco Use  Smoking Status Former Smoker  . Packs/day: 3.00  . Years: 20.00  . Pack years: 60.00  . Types: Cigarettes  . Last attempt to quit: 04/11/2016  . Years since quitting: 1.5  Smokeless Tobacco Never Used    Goals Met:  Proper associated with RPD/PD & O2 Sat Independence with exercise equipment Using PLB without cueing & demonstrates good technique Exercise tolerated well No report of cardiac concerns or symptoms Strength training completed today  Goals Unmet:  Not Applicable  Comments: Pt able to follow exercise prescription today without complaint.  Will continue to monitor for progression.    Dr. Emily Filbert is Medical Director for [place] and [facility].

## 2017-11-12 ENCOUNTER — Encounter: Payer: Medicaid Other | Admitting: *Deleted

## 2017-11-12 DIAGNOSIS — J449 Chronic obstructive pulmonary disease, unspecified: Secondary | ICD-10-CM

## 2017-11-12 NOTE — Progress Notes (Signed)
Daily Session Note  Patient Details  Name: Chris Case MRN: [id number] Date of Birth: 1970/09/08 Referring Provider:     Pulmonary Rehab from 10/06/2017 in [facility] Cardiac and Pulmonary Rehab  Referring Provider  Ancil Linsey MD      Encounter Date: 11/12/2017  Check In: Session Check In - 11/12/17 1138      Check-In   Supervising physician immediately available to respond to emergencies  [facility] immediately available ER MD    Physician(s)  Dr. Darl Householder and Jimmye Norman    Location  [facility]-Cardiac & Pulmonary Rehab    Staff Present  Renita Papa, RN BSN;Jessica Luan Pulling, MA, RCEP, CCRP, Exercise Physiologist;Joseph Tessie Fass RCP,RRT,BSRT    Medication changes reported      No    Fall or balance concerns reported     No    Warm-up and Cool-down  Performed as group-led instruction    Resistance Training Performed  Yes    VAD Patient?  No    PAD/SET Patient?  No      Pain Assessment   Currently in Pain?  No/denies          Social History   Tobacco Use  Smoking Status Former Smoker  . Packs/day: 3.00  . Years: 20.00  . Pack years: 60.00  . Types: Cigarettes  . Last attempt to quit: 04/11/2016  . Years since quitting: 1.5  Smokeless Tobacco Never Used    Goals Met:  Proper associated with RPD/PD & O2 Sat Independence with exercise equipment Using PLB without cueing & demonstrates good technique Exercise tolerated well No report of cardiac concerns or symptoms Strength training completed today  Goals Unmet:  Not Applicable  Comments: Pt able to follow exercise prescription today without complaint.  Will continue to monitor for progression.    Dr. Emily Filbert is Medical Director for [place] and [facility].

## 2017-11-14 ENCOUNTER — Encounter: Payer: Medicaid Other | Admitting: *Deleted

## 2017-11-14 DIAGNOSIS — J449 Chronic obstructive pulmonary disease, unspecified: Secondary | ICD-10-CM

## 2017-11-14 NOTE — Progress Notes (Signed)
Daily Session Note  Patient Details  Name: Chris Case MRN: [id number] Date of Birth: 07/14/1970 Referring Provider:     Pulmonary Rehab from 10/06/2017 in [facility] Cardiac and Pulmonary Rehab  Referring Provider  Ancil Linsey MD      Encounter Date: 11/14/2017  Check In: Session Check In - 11/14/17 1130      Check-In   Supervising physician immediately available to respond to emergencies  [facility] immediately available ER MD    Physician(s)  Drs. Paduchowski and Psychologist, forensic & Pulmonary Rehab    Staff Present  Renita Papa, RN BSN;Keely Drennan Luan Pulling, MA, RCEP, CCRP, Exercise Physiologist;Joseph Tessie Fass RCP,RRT,BSRT    Medication changes reported      No    Fall or balance concerns reported     No    Warm-up and Cool-down  Performed as group-led instruction    Resistance Training Performed  Yes    VAD Patient?  No    PAD/SET Patient?  No      Pain Assessment   Currently in Pain?  No/denies          Social History   Tobacco Use  Smoking Status Former Smoker  . Packs/day: 3.00  . Years: 20.00  . Pack years: 60.00  . Types: Cigarettes  . Last attempt to quit: 04/11/2016  . Years since quitting: 1.5  Smokeless Tobacco Never Used    Goals Met:  Independence with exercise equipment Exercise tolerated well No report of cardiac concerns or symptoms Strength training completed today  Goals Unmet:  Not Applicable  Comments: Pt able to follow exercise prescription today without complaint.  Will continue to monitor for progression.    Dr. Emily Filbert is Medical Director for [place] and [facility].

## 2017-11-17 DIAGNOSIS — J449 Chronic obstructive pulmonary disease, unspecified: Secondary | ICD-10-CM | POA: Diagnosis not present

## 2017-11-17 NOTE — Progress Notes (Signed)
Daily Session Note  Patient Details  Name: Chris Case MRN: [id number] Date of Birth: 04/28/1970 Referring Provider:     Pulmonary Rehab from 10/06/2017 in [facility] Cardiac and Pulmonary Rehab  Referring Provider  Ancil Linsey MD      Encounter Date: 11/17/2017  Check In:      Social History   Tobacco Use  Smoking Status Former Smoker  . Packs/day: 3.00  . Years: 20.00  . Pack years: 60.00  . Types: Cigarettes  . Last attempt to quit: 04/11/2016  . Years since quitting: 1.6  Smokeless Tobacco Never Used    Goals Met:  Proper associated with RPD/PD & O2 Sat Independence with exercise equipment Exercise tolerated well Strength training completed today  Goals Unmet:  Not Applicable  Comments: Pt able to follow exercise prescription today without complaint.  Will continue to monitor for progression.    Dr. Emily Filbert is Medical Director for [place] and [facility].

## 2017-11-19 ENCOUNTER — Encounter: Payer: Medicaid Other | Admitting: *Deleted

## 2017-11-19 DIAGNOSIS — J449 Chronic obstructive pulmonary disease, unspecified: Secondary | ICD-10-CM | POA: Diagnosis not present

## 2017-11-19 NOTE — Progress Notes (Signed)
Daily Session Note  Patient Details  Name: Chris Case MRN: [id number] Date of Birth: January 14, 1971 Referring Provider:     Pulmonary Rehab from 10/06/2017 in [facility] Cardiac and Pulmonary Rehab  Referring Provider  Ancil Linsey MD      Encounter Date: 11/19/2017  Check In: Session Check In - 11/19/17 1128      Check-In   Supervising physician immediately available to respond to emergencies  [facility] immediately available ER MD    Physician(s)  Dr. Alfred Levins and Mariea Clonts    Location  [facility]-Cardiac & Pulmonary Rehab    Staff Present  Renita Papa, RN BSN;Joseph Darrin Nipper, [place], RCEP, CCRP, Exercise Physiologist    Medication changes reported      No    Fall or balance concerns reported     No    Warm-up and Cool-down  Performed as group-led instruction    Resistance Training Performed  Yes    VAD Patient?  No    PAD/SET Patient?  No      Pain Assessment   Currently in Pain?  No/denies          Social History   Tobacco Use  Smoking Status Former Smoker  . Packs/day: 3.00  . Years: 20.00  . Pack years: 60.00  . Types: Cigarettes  . Last attempt to quit: 04/11/2016  . Years since quitting: 1.6  Smokeless Tobacco Never Used    Goals Met:  Proper associated with RPD/PD & O2 Sat Independence with exercise equipment Using PLB without cueing & demonstrates good technique Exercise tolerated well No report of cardiac concerns or symptoms Strength training completed today  Goals Unmet:  Not Applicable  Comments: Pt able to follow exercise prescription today without complaint.  Will continue to monitor for progression.    Dr. Emily Filbert is Medical Director for [place] and [facility].

## 2017-11-21 ENCOUNTER — Encounter: Payer: Medicaid Other | Admitting: *Deleted

## 2017-11-21 DIAGNOSIS — J449 Chronic obstructive pulmonary disease, unspecified: Secondary | ICD-10-CM

## 2017-11-21 NOTE — Progress Notes (Signed)
Daily Session Note  Patient Details  Name: Chris Case MRN: [id number] Date of Birth: 05-04-1970 Referring Provider:     Pulmonary Rehab from 10/06/2017 in [facility] Cardiac and Pulmonary Rehab  Referring Provider  Ancil Linsey MD      Encounter Date: 11/21/2017  Check In: Session Check In - 11/21/17 1146      Check-In   Supervising physician immediately available to respond to emergencies  [facility] immediately available ER MD    Physician(s)  Dr. Quentin Cornwall and Archie Balboa    Location  [facility]-Cardiac & Pulmonary Rehab    Staff Present  Renita Papa, RN BSN;Jessica Luan Pulling, MA, RCEP, CCRP, Exercise Physiologist;Joseph Tessie Fass RCP,RRT,BSRT    Medication changes reported      No    Fall or balance concerns reported     No    Warm-up and Cool-down  Performed as group-led instruction    Resistance Training Performed  Yes    VAD Patient?  No    PAD/SET Patient?  No      Pain Assessment   Currently in Pain?  No/denies          Social History   Tobacco Use  Smoking Status Former Smoker  . Packs/day: 3.00  . Years: 20.00  . Pack years: 60.00  . Types: Cigarettes  . Last attempt to quit: 04/11/2016  . Years since quitting: 1.6  Smokeless Tobacco Never Used    Goals Met:  Proper associated with RPD/PD & O2 Sat Independence with exercise equipment Using PLB without cueing & demonstrates good technique Exercise tolerated well No report of cardiac concerns or symptoms Strength training completed today  Goals Unmet:  Not Applicable  Comments: Pt able to follow exercise prescription today without complaint.  Will continue to monitor for progression.\    Dr. Emily Filbert is Medical Director for [place] and [facility].

## 2017-11-24 DIAGNOSIS — J449 Chronic obstructive pulmonary disease, unspecified: Secondary | ICD-10-CM

## 2017-11-24 NOTE — Progress Notes (Signed)
Daily Session Note  Patient Details  Name: Chris Case MRN: [id number] Date of Birth: June 07, 1970 Referring Provider:     Pulmonary Rehab from 10/06/2017 in [facility] Cardiac and Pulmonary Rehab  Referring Provider  Ancil Linsey MD      Encounter Date: 11/24/2017  Check In:      Social History   Tobacco Use  Smoking Status Former Smoker  . Packs/day: 3.00  . Years: 20.00  . Pack years: 60.00  . Types: Cigarettes  . Last attempt to quit: 04/11/2016  . Years since quitting: 1.6  Smokeless Tobacco Never Used    Goals Met:  Proper associated with RPD/PD & O2 Sat Independence with exercise equipment Exercise tolerated well Strength training completed today  Goals Unmet:  Not Applicable  Comments: Pt able to follow exercise prescription today without complaint.  Will continue to monitor for progression.    Dr. Emily Filbert is Medical Director for [place] and [facility].

## 2017-11-24 NOTE — Progress Notes (Signed)
Pulmonary Individual Treatment Plan  Patient Details  Name: Chris Case MRN: [id number] Date of Birth: May 09, 1970 Referring Provider:     Pulmonary Rehab from 10/06/2017 in [facility]  Referring Provider  Ancil Linsey MD      Initial Encounter Date:    Pulmonary Rehab from 10/06/2017 in [facility] Card[facility] and Pulmonary Rehab  Date  10/06/17      Visit Diagnosis: Chronic obstructive pulmonary disease, unspecified COPD type ([place])  [name] Home Medications on Admission:  Current Outpatient Medications:  .  albuterol (PROVENTIL) (2.5 MG/3ML) 0.083% nebulizer solution, Inhale 3 mLs into the lungs every 4 (four) hours as needed for wheezing or shortness of breath., Disp: 75 mL, Rfl: 12 .  aspirin EC 81 MG EC tablet, Take 1 tablet (81 mg total) by mouth daily., Disp: 30 tablet, Rfl: 6 .  furosemide (LASIX) 20 MG tablet, Take 20 mg by mouth daily., Disp: , Rfl: 0 .  mometasone-formoterol (DULERA) 200-5 MCG/ACT AERO, Inhale 2 puffs into the lungs 2 (two) times daily., Disp: 1 Inhaler, Rfl: 5 .  OXYGEN, Inhale 2 L into the lungs., Disp: , Rfl:   Past Medical History: Past Medical History:  Diagnosis Date  . COPD (chronic obstructive pulmonary disease) ([place])   . Emphysema lung ([place])   . 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 Packs/day: 3.00  . Years: 20.00  . Pack years: 60.00  . Types: Cigarettes  . Last attempt to quit: 04/11/2016  . Years since quitting: 1.6  Smokeless Tobacco Never Used    Labs: Recent Review Flowsheet Data    There is no flowsheet data to display.       Pulmonary Assessment Scores: Pulmonary Assessment Scores    Row Name 10/06/17 1404         ADL UCSD   ADL Phase  Entry     SOB Score total  47     Rest  4     Walk  3     Stairs  2     Bath  2     Dress  2     Shop  2       CAT Score   CAT Score  22       mMRC Score    mMRC Score  2        Pulmonary Function Assessment: Pulmonary Function Assessment - 10/06/17 1408      Initial Spirometry Results   FVC%  30 %   Test done on 06/19/16 Care Everywhere   FEV1%  17 %    FEV1/FVC Ratio  46      Post Bronchodilator Spirometry Results   FVC%  35 %   Test done on 06/19/16 Care Everywhere   FEV1%  20 %    FEV1/FVC Ratio  46      Breath   Bilateral Breath Sounds  Decreased    Shortness of Breath  Yes;Panic with Shortness of Breath;Limiting activity       Exercise Target Goals: Exercise Program Goal: Individual exercise prescription set using results from initial 6 min walk test and THRR while considering  patient's activity barriers and safety.   Exercise Prescription Goal: Initial exercise prescription builds to 30-45 minutes a day of aerobic activity, 2-3 days per week.  Home exercise guidelines will be given to patient during program as  part of exercise prescription that the participant will acknowledge.  Activity Barriers & Risk Stratification: Activity Barriers & Cardiac Risk Stratification - 10/06/17 1422      Activity Barriers & Cardiac Risk Stratification   Activity Barriers  Deconditioning;Shortness of Breath;Muscular Weakness       6 Minute Walk: 6 Minute Walk    Row Name 10/06/17 1420         6 Minute Walk   Phase  Initial     Distance  920 feet     Walk Time  6 minutes     # of Rest Breaks  0     MPH  1.74     METS  3.75     RPE  11     Perceived Dyspnea   2     VO2 Peak  13.14     Symptoms  Yes (comment)     Comments  SOB     Resting HR  78 bpm     Resting BP  130/60     Resting Oxygen Saturation   96 %     Exercise Oxygen Saturation  during 6 min walk  85 %     Max Ex. HR  102 bpm     Max Ex. BP  138/74     2 Minute Post BP  132/74       Interval HR   1 Minute HR  93     2 Minute HR  94     3 Minute HR  96     4 Minute HR  100     5 Minute HR  101     6 Minute HR  102     2 Minute Post HR  87     Interval  Heart Rate?  Yes       Interval Oxygen   Interval Oxygen?  Yes     Baseline Oxygen Saturation %  96 %     1 Minute Oxygen Saturation %  92 %     1 Minute Liters of Oxygen  3 L     2 Minute Oxygen Saturation %  92 % 2:33 83%     2 Minute Liters of Oxygen  3 L     3 Minute Oxygen Saturation %  88 %     3 Minute Liters of Oxygen  3 L     4 Minute Oxygen Saturation %  86 %     4 Minute Liters of Oxygen  3 L     5 Minute Oxygen Saturation %  86 %     5 Minute Liters of Oxygen  3 L     6 Minute Oxygen Saturation %  86 % 6:05 85%     6 Minute Liters of Oxygen  3 L     2 Minute Post Oxygen Saturation %  94 %     2 Minute Post Liters of Oxygen  3 L       Oxygen Initial Assessment: Oxygen Initial Assessment - 10/06/17 1430      Home Oxygen   Home Oxygen Device  Home Concentrator;E-Tanks    Sleep Oxygen Prescription  Continuous    Liters per minute  2    Home Exercise Oxygen Prescription  Continuous    Liters per minute  3    Home at Rest Exercise Oxygen Prescription  Continuous    Liters per minute  3    Compliance with Home Oxygen Use  Yes      Initial 6 min Walk   Oxygen Used  Continuous    Liters per minute  3      Program Oxygen Prescription   Program Oxygen Prescription  Continuous    Liters per minute  3      Intervention   Short Term Goals  To learn and exhibit compliance with exercise, home and travel O2 prescription;To learn and understand importance of monitoring SPO2 with pulse oximeter and demonstrate accurate use of the pulse oximeter.;To learn and understand importance of maintaining oxygen saturations>88%;To learn and demonstrate proper pursed lip breathing techniques or other breathing techniques.;To learn and demonstrate proper use of respiratory medications    Long  Term Goals  Exhibits compliance with exercise, home and travel O2 prescription;Verbalizes importance of monitoring SPO2 with pulse oximeter and return demonstration;Maintenance of O2  saturations>88%;Exhibits proper breathing techniques, such as pursed lip breathing or other method taught during program session;Compliance with respiratory medication;Demonstrates proper use of MDI's       Oxygen Re-Evaluation: Oxygen Re-Evaluation    Row Name 10/13/17 1605 11/07/17 1143           Program Oxygen Prescription   Program Oxygen Prescription  -  Continuous      Liters per minute  -  3        Home Oxygen   Home Oxygen Device  -  Home Concentrator;E-Tanks      Sleep Oxygen Prescription  -  Continuous      Liters per minute  -  2      Home Exercise Oxygen Prescription  -  Continuous      Liters per minute  -  3      Home at Rest Exercise Oxygen Prescription  -  Continuous      Liters per minute  -  3      Compliance with Home Oxygen Use  -  Yes        Goals/Expected Outcomes   Short Term Goals  To learn and understand importance of monitoring SPO2 with pulse oximeter and demonstrate accurate use of the pulse oximeter.;To learn and understand importance of maintaining oxygen saturations>88%;To learn and demonstrate proper pursed lip breathing techniques or other breathing techniques.  To learn and understand importance of monitoring SPO2 with pulse oximeter and demonstrate accurate use of the pulse oximeter.;To learn and understand importance of maintaining oxygen saturations>88%;To learn and demonstrate proper pursed lip breathing techniques or other breathing techniques.;To learn and demonstrate proper use of respiratory medications;To learn and exhibit compliance with exercise, home and travel O2 prescription      Long  Term Goals  Verbalizes importance of monitoring SPO2 with pulse oximeter and return demonstration;Maintenance of O2 saturations>88%;Exhibits proper breathing techniques, such as pursed lip breathing or other method taught during program session  Verbalizes importance of monitoring SPO2 with pulse oximeter and return demonstration;Maintenance of O2  saturations>88%;Exhibits proper breathing techniques, such as pursed lip breathing or other method taught during program session;Compliance with respiratory medication;Demonstrates proper use of MDI's;Exhibits compliance with exercise, home and travel O2 prescription      Comments  Reviewed PLB technique with pt.  Talked about how it work and it's important to maintaining his exercise saturations.    Patient is taking his Albuterol nebulizer every 4 hours and as needed. He also takes advair and spiriva. Informed patient to take his medications in the proper order to better benifit his breathing. He should take is albuterol nebulizer or albuterol  MDI before he takes his other inhalers.      Goals/Expected Outcomes  Short: Become more profiecient at using PLB.   Long: Become independent at using PLB.  Short: use medication as prescribed. Long: Take medications independently and efficiently         Oxygen Discharge (Final Oxygen Re-Evaluation): Oxygen Re-Evaluation - 11/07/17 1143      Program Oxygen Prescription   Program Oxygen Prescription  Continuous    Liters per minute  3      Home Oxygen   Home Oxygen Device  Home Concentrator;E-Tanks    Sleep Oxygen Prescription  Continuous    Liters per minute  2    Home Exercise Oxygen Prescription  Continuous    Liters per minute  3    Home at Rest Exercise Oxygen Prescription  Continuous    Liters per minute  3    Compliance with Home Oxygen Use  Yes      Goals/Expected Outcomes   Short Term Goals  To learn and understand importance of monitoring SPO2 with pulse oximeter and demonstrate accurate use of the pulse oximeter.;To learn and understand importance of maintaining oxygen saturations>88%;To learn and demonstrate proper pursed lip breathing techniques or other breathing techniques.;To learn and demonstrate proper use of respiratory medications;To learn and exhibit compliance with exercise, home and travel O2 prescription    Long  Term Goals   Verbalizes importance of monitoring SPO2 with pulse oximeter and return demonstration;Maintenance of O2 saturations>88%;Exhibits proper breathing techniques, such as pursed lip breathing or other method taught during program session;Compliance with respiratory medication;Demonstrates proper use of MDI's;Exhibits compliance with exercise, home and travel O2 prescription    Comments  Patient is taking his Albuterol nebulizer every 4 hours and as needed. He also takes advair and spiriva. Informed patient to take his medications in the proper order to better benifit his breathing. He should take is albuterol nebulizer or albuterol MDI before he takes his other inhalers.    Goals/Expected Outcomes  Short: use medication as prescribed. Long: Take medications independently and efficiently       Initial Exercise Prescription: Initial Exercise Prescription - 10/06/17 1400      Date of Initial Exercise RX and Referring Provider   Date  10/06/17    Referring Provider  Ancil Linsey MD      Oxygen   Oxygen  Continuous    Liters  3      Treadmill   MPH  1.7    Grade  0    Minutes  15    METs  2.3      REL-XR   Level  3    Speed  50    Minutes  15    METs  2.3      T5 Nustep   Level  3    SPM  80    Minutes  15    METs  2.3      Prescription Details   Frequency (times per week)  3    Duration  Progress to 45 minutes of aerobic exercise without signs/symptoms of physical distress      Intensity   THRR 40-80% of Max Heartrate  116-154    Ratings of Perceived Exertion  11-13    Perceived Dyspnea  0-4      Progression   Progression  Continue to progress workloads to maintain intensity without signs/symptoms of physical distress.      Resistance Training   Training Prescription  Yes  Weight  3 lbs    Reps  10-15       Perform Capillary Blood Glucose checks as needed.  Exercise Prescription Changes: Exercise Prescription Changes    Row Name 10/06/17 1400 10/14/17 1300  10/22/17 1200 10/27/17 1400 11/10/17 1400     Response to Exercise   Blood Pressure (Admit)  130/60  120/74  -  128/70  120/64   Blood Pressure (Exercise)  138/74  138/82  -  -  -   Blood Pressure (Exit)  132/74  120/68  -  110/68  126/62   Heart Rate (Admit)  78 bpm  76 bpm  -  81 bpm  88 bpm   Heart Rate (Exercise)  102 bpm  105 bpm  -  102 bpm  98 bpm   Heart Rate (Exit)  87 bpm  90 bpm  -  87 bpm  88 bpm   Oxygen Saturation (Admit)  96 %  92 %  -  91 %  95 %   Oxygen Saturation (Exercise)  85 %  92 %  -  94 %  91 %   Oxygen Saturation (Exit)  94 %  99 %  -  98 %  98 %   Rating of Perceived Exertion (Exercise)  11  13  -  13  11   Perceived Dyspnea (Exercise)  2  3  -  2  1   Symptoms  SOB  SOB  -  SOB  SOB   Comments  walk test results  first full day of exercise  -  -  -   Duration  -  Progress to 45 minutes of aerobic exercise without signs/symptoms of physical distress  -  Continue with 45 min of aerobic exercise without signs/symptoms of physical distress.  Continue with 45 min of aerobic exercise without signs/symptoms of physical distress.   Intensity  -  THRR unchanged  -  THRR unchanged  THRR unchanged     Progression   Progression  -  Continue to progress workloads to maintain intensity without signs/symptoms of physical distress.  -  Continue to progress workloads to maintain intensity without signs/symptoms of physical distress.  Continue to progress workloads to maintain intensity without signs/symptoms of physical distress.   Average METs  -  2.9  -  2.67  2.7     Resistance Training   Training Prescription  -  Yes  -  Yes  Yes   Weight  -  3 lbs  -  4 lbs  4 lbs   Reps  -  10-15  -  10-15  10-15     Interval Training   Interval Training  -  No  -  No  No     Oxygen   Oxygen  -  Continuous  -  Continuous  Continuous   Liters  -  3  -  3  3     Treadmill   MPH  -  1.7  -  1.7  1.7   Grade  -  0  -  0  0   Minutes  -  15  -  15  15   METs  -  2.3  -  2.3  2.3      REL-XR   Level  -  3  -  3  3   Minutes  -  15  -  15  15   METs  -  3.5  -  3.7  3.8     T5 Nustep   Level  -  3  -  3  3   Minutes  -  15  -  15  15   METs  -  -  -  2  2     Home Exercise Plan   Plans to continue exercise at  -  -  Home (comment) walking  Home (comment) walking  Home (comment) walking   Frequency  -  -  Add 1 additional day to program exercise sessions.  Add 1 additional day to program exercise sessions.  Add 1 additional day to program exercise sessions.   Initial Home Exercises Provided  -  -  10/22/17  10/22/17  10/22/17      Exercise Comments: Exercise Comments    Row Name 10/13/17 1508           Exercise Comments  First full day of exercise!  Patient was oriented to gym and equipment including functions, settings, policies, and procedures.  [name] individual exercise prescription and treatment plan were reviewed.  All starting workloads were established based on the results of the 6 minute walk test done at initial orientation visit.  The plan for exercise progression was also introduced and progression will be customized based on patient's performance and goals.          Exercise Goals and Review: Exercise Goals    Row Name 10/06/17 1424             Exercise Goals   Increase Physical Activity  Yes       Intervention  Provide advice, education, support and counseling about physical activity/exercise needs.;Develop an individualized exercise prescription for aerobic and resistive training based on initial evaluation findings, risk stratification, comorbidities and participant's personal goals.       Expected Outcomes  Short Term: Attend rehab on a regular basis to increase amount of physical activity.;Long Term: Add in home exercise to make exercise part of routine and to increase amount of physical activity.;Long Term: Exercising regularly at least 3-5 days a week.       Increase Strength and Stamina  Yes       Intervention  Provide advice,  education, support and counseling about physical activity/exercise needs.;Develop an individualized exercise prescription for aerobic and resistive training based on initial evaluation findings, risk stratification, comorbidities and participant's personal goals.       Expected Outcomes  Short Term: Increase workloads from initial exercise prescription for resistance, speed, and METs.;Short Term: Perform resistance training exercises routinely during rehab and add in resistance training at home;Long Term: Improve cardiorespiratory fitness, muscular endurance and strength as measured by increased METs and functional capacity (6MWT)       Able to understand and use rate of perceived exertion (RPE) scale  Yes       Intervention  Provide education and explanation on how to use RPE scale       Expected Outcomes  Short Term: Able to use RPE daily in rehab to express subjective intensity level;Long Term:  Able to use RPE to guide intensity level when exercising independently       Able to understand and use Dyspnea scale  Yes       Intervention  Provide education and explanation on how to use Dyspnea scale       Expected Outcomes  Short Term: Able to use Dyspnea scale daily in rehab to express subjective sense of shortness of breath during exertion;Long  Term: Able to use Dyspnea scale to guide intensity level when exercising independently       Knowledge and understanding of Target Heart Rate Range (THRR)  Yes       Intervention  Provide education and explanation of THRR including how the numbers were predicted and where they are located for reference       Expected Outcomes  Short Term: Able to state/look up THRR;Short Term: Able to use daily as guideline for intensity in rehab;Long Term: Able to use THRR to govern intensity when exercising independently       Able to check pulse independently  Yes       Intervention  Provide education and demonstration on how to check pulse in carotid and radial  arteries.;Review the importance of being able to check your own pulse for safety during independent exercise       Expected Outcomes  Short Term: Able to explain why pulse checking is important during independent exercise;Long Term: Able to check pulse independently and accurately       Understanding of Exercise Prescription  Yes       Intervention  Provide education, explanation, and written materials on patient's individual exercise prescription       Expected Outcomes  Short Term: Able to explain program exercise prescription;Long Term: Able to explain home exercise prescription to exercise independently          Exercise Goals Re-Evaluation : Exercise Goals Re-Evaluation    Row Name 10/13/17 1508 10/22/17 1233 10/27/17 1452 11/10/17 1352       Exercise Goal Re-Evaluation   Exercise Goals Review  Increase Physical Activity;Increase Strength and Stamina;Able to understand and use rate of perceived exertion (RPE) scale;Able to understand and use Dyspnea scale  Increase Physical Activity;Increase Strength and Stamina;Understanding of Exercise Prescription  Increase Physical Activity;Increase Strength and Stamina;Understanding of Exercise Prescription  Increase Physical Activity;Increase Strength and Stamina;Understanding of Exercise Prescription    Comments  Reviewed RPE scale, THR and program prescription with pt today.  Pt voiced understanding and was given a copy of goals to take home.   Reviewed home exercise with pt today.  Pt plans to walk at home for exercise.  He is going to start with one day a week.  Reviewed THR, pulse, RPE, sign and symptoms, and when to call 911 or MD.  Also discussed weather considerations and indoor options.  Pt voiced understanding.  Amoni is doing well in rehab.  He is doing well on 3 liters for exercise but he still gets SOB on teh treadmill.  We will continue to try to increase workloads and monitor his progress.   Reginold continues to well in rehab.  He is steady  on his workloads but everything is finally down to RPE of 11 so we will increase his workloads. We will continue to monitor his progression.     Expected Outcomes  Short: Use RPE daily to regulate intensity. Long: Follow program prescription in THR.  Short: Add in at least one extra day a week.  Long: Continue to exercise independently  Short: Increase workloads.  Long: Continue to improve strength and stamina.   Short: Increase workloads.  Long: Continue to increase physical activity.        Discharge Exercise Prescription (Final Exercise Prescription Changes): Exercise Prescription Changes - 11/10/17 1400      Response to Exercise   Blood Pressure (Admit)  120/64    Blood Pressure (Exit)  126/62    Heart Rate (Admit)  88 bpm    Heart Rate (Exercise)  98 bpm    Heart Rate (Exit)  88 bpm    Oxygen Saturation (Admit)  95 %    Oxygen Saturation (Exercise)  91 %    Oxygen Saturation (Exit)  98 %    Rating of Perceived Exertion (Exercise)  11    Perceived Dyspnea (Exercise)  1    Symptoms  SOB    Duration  Continue with 45 min of aerobic exercise without signs/symptoms of physical distress.    Intensity  THRR unchanged      Progression   Progression  Continue to progress workloads to maintain intensity without signs/symptoms of physical distress.    Average METs  2.7      Resistance Training   Training Prescription  Yes    Weight  4 lbs    Reps  10-15      Interval Training   Interval Training  No      Oxygen   Oxygen  Continuous    Liters  3      Treadmill   MPH  1.7    Grade  0    Minutes  15    METs  2.3      REL-XR   Level  3    Minutes  15    METs  3.8      T5 Nustep   Level  3    Minutes  15    METs  2      Home Exercise Plan   Plans to continue exercise at  Home (comment)   walking   Frequency  Add 1 additional day to program exercise sessions.    Initial Home Exercises Provided  10/22/17       Nutrition:  Target Goals: Understanding of nutrition  guidelines, daily intake of sodium <1572m, cholesterol <2077m calories 30% from fat and 7% or less from saturated fats, daily to have 5 or more servings of fruits and vegetables.  Biometrics: Pre Biometrics - 10/06/17 1424      Pre Biometrics   Height  5' 7.2" (1.707 m)    Weight  155 lb 14.4 oz (70.7 kg)    Waist Circumference  35 inches    Hip Circumference  37 inches    Waist to Hip Ratio  0.95 %    BMI (Calculated)  24.27        Nutrition Therapy Plan and Nutrition Goals: Nutrition Therapy & Goals - 10/22/17 1238      Nutrition Therapy   Diet  TLC    Protein (specify units)  9oz    Fiber  30 grams   likely not meeting fiber goal   Whole Grain Foods  3 servings   does not usually choose whole grains   Saturated Fats  16 max. grams    Fruits and Vegetables  5 servings/day   8 ideal; does not eat fruit daily    Sodium  2000 grams   does not monitor sodium intake     Personal Nutrition Goals   Nutrition Goal  Start to pay attention to the sodium content in foods, particularly for pre-prepared meals and canned items. Choose foods with less than 30030mf sodium    Personal Goal #2  Increase water consumption, start by swapping one soda for a glass of water per day    Personal Goal #3  Look in the frozen section for vegetables and beans, rather than choosing canned. They can be  a cheap and quick-cook option that you can add as a side to any meal    Comments  He is looking to maintain is CBW. Eats several processed snack foods daily (usually in place of dinner), eats fried foods, does not monitor salt, and most meals consumed are ready-prepared/ frozen      Intervention Plan   Intervention  Prescribe, educate and counsel regarding individualized specific dietary modifications aiming towards targeted core components such as weight, hypertension, lipid management, diabetes, heart failure and other comorbidities.;Nutrition handout(s) given to patient.   nutrition guidelines for  COPD handout   Expected Outcomes  Short Term Goal: Understand basic principles of dietary content, such as calories, fat, sodium, cholesterol and nutrients.;Short Term Goal: A plan has been developed with personal nutrition goals set during dietitian appointment.;Long Term Goal: Adherence to prescribed nutrition plan.       Nutrition Assessments:   Nutrition Goals Re-Evaluation: Nutrition Goals Re-Evaluation    [place] Name 10/22/17 1245             Goals   Nutrition Goal  Start to pay attention to the sodium content in foods, particularly for pre-prepared meals and canned items. Choose foods with less than 321m of sodium       Comment  Currently he does not pay attention the sodium content in foods. His diet is high in pre-packaged foods, canned items, frozen meals, fried foods       Expected Outcome  He will start to read food labels and become more aware of the salt in foods he consumes. Ideally, he will choose foods with less than 3087mof sodium         Personal Goal #2 Re-Evaluation   Personal Goal #2  Increase water consumption, start by swapping one soda for a glass of water per day         Personal Goal #3 Re-Evaluation   Personal Goal #3  Look in the frozen section for vegetables and beans, rather than choosing canned. They can be a cheap and quick- cook option that you can add as a side to any meal          Nutrition Goals Discharge (Final Nutrition Goals Re-Evaluation): Nutrition Goals Re-Evaluation - 10/22/17 1245      Goals   Nutrition Goal  Start to pay attention to the sodium content in foods, particularly for pre-prepared meals and canned items. Choose foods with less than 30050mf sodium    Comment  Currently he does not pay attention the sodium content in foods. His diet is high in pre-packaged foods, canned items, frozen meals, fried foods    Expected Outcome  He will start to read food labels and become more aware of the salt in foods he consumes. Ideally, he will  choose foods with less than 300m35m sodium      Personal Goal #2 Re-Evaluation   Personal Goal #2  Increase water consumption, start by swapping one soda for a glass of water per day      Personal Goal #3 Re-Evaluation   Personal Goal #3  Look in the frozen section for vegetables and beans, rather than choosing canned. They can be a cheap and quick- cook option that you can add as a side to any meal       Psychosocial: Target Goals: Acknowledge presence or absence of significant depression and/or stress, maximize coping skills, provide positive support system. Participant is able to verbalize types and ability to use techniques  and skills needed for reducing stress and depression.   Initial Review & Psychosocial Screening: Initial Psych Review & Screening - 10/06/17 1421      Initial Review   Current issues with  Current Stress Concerns    Source of Stress Concerns  Chronic Illness    Comments  His breathing is his main concern      Family Dynamics   Good Support System?  Yes    Comments  His oldest sister is good for support.      Barriers   Psychosocial barriers to participate in program  The patient should benefit from training in stress management and relaxation.      Screening Interventions   Interventions  Encouraged to exercise;To provide support and resources with identified psychosocial needs;Program counselor consult;Provide feedback about the scores to participant    Expected Outcomes  Short Term goal: Utilizing psychosocial counselor, staff and physician to assist with identification of specific Stressors or current issues interfering with healing process. Setting desired goal for each stressor or current issue identified.;Long Term Goal: Stressors or current issues are controlled or eliminated.;Short Term goal: Identification and review with participant of any Quality of Life or Depression concerns found by scoring the questionnaire.;Long Term goal: The participant  improves quality of Life and PHQ9 Scores as seen by post scores and/or verbalization of changes       Quality of Life Scores:  Scores of 19 and below usually indicate a poorer quality of life in these areas.  A difference of  2-3 points is a clinically meaningful difference.  A difference of 2-3 points in the total score of the Quality of Life Index has been associated with significant improvement in overall quality of life, self-image, physical symptoms, and general health in studies assessing change in quality of life.  PHQ-9: Recent Review Flowsheet Data    Depression screen [facility] 2/9 10/06/2017   Decreased Interest 3   Down, Depressed, Hopeless 3   PHQ - 2 Score 6   Altered sleeping 0   Tired, decreased energy 2   Change in appetite 0   Feeling bad or failure about yourself  0   Trouble concentrating 0   Moving slowly or fidgety/restless 0   Suicidal thoughts 0   PHQ-9 Score 8   Difficult doing work/chores Somewhat difficult     Interpretation of Total Score  Total Score Depression Severity:  1-4 = Minimal depression, 5-9 = Mild depression, 10-14 = Moderate depression, 15-19 = Moderately severe depression, 20-27 = Severe depression   Psychosocial Evaluation and Intervention: Psychosocial Evaluation - 10/22/17 1238      Psychosocial Evaluation & Interventions   Interventions  Stress management education;Encouraged to exercise with the program and follow exercise prescription    Comments  Counselor met with Mr. Kilgore Huckaba) today for initial psychosocial evaluation.  He is a 47 year old who has been diagnosed with COPD.  He has a limited support system with a sister in [place] and he lives with a friend.   Keni reports sleeping well and has a good appetite.  He denies a history of depression or anxiety or any current symptoms and is typically in a positive mood.  Sylvestre states he was a little depressed initially when he was put on oxygen full-time and lost interest in going  places or doing things he used to enjoy.  But, he has since grown to accept this and is working towards getting off the oxygen by coming to this class  and learning how to conserve energy; pace himself and used pursed lip breathing.  He reports progress already seen in a few short weeks with being able to park his truck and walk into the class without use of valet parking or being put in a wheel chair.  Counselor commended him on his progress made and his commitment to his goals.  Staff will follow with him.     Expected Outcomes  Short:  Burlie will continue to exercise consistently and walk in and out to his truck without assistance.   Long:  Adrain will develop strategies to breathe without full time oxygen; including consistent exercise.      Continue Psychosocial Services   Follow up required by staff       Psychosocial Re-Evaluation: Psychosocial Re-Evaluation    [facility] Name 11/07/17 1149             Psychosocial Re-Evaluation   Current issues with  Current Stress Concerns       Comments  Patient feels like his breathing has improved slightly. He feels like he can catch his breath faster which makes him more relaxed. He is focused to get his breathing better and has a good attitude.       Expected Outcomes  Short attened [facility] to decrease stress. Long: maintain an exercise program to keep stress at a minimum       Interventions  Encouraged to attend Pulmonary Rehabilitation for the exercise       Continue Psychosocial Services   Follow up required by staff          Psychosocial Discharge (Final Psychosocial Re-Evaluation): Psychosocial Re-Evaluation - 11/07/17 1149      Psychosocial Re-Evaluation   Current issues with  Current Stress Concerns    Comments  Patient feels like his breathing has improved slightly. He feels like he can catch his breath faster which makes him more relaxed. He is focused to get his breathing better and has a good attitude.    Expected Outcomes  Short  attened [facility] to decrease stress. Long: maintain an exercise program to keep stress at a minimum    Interventions  Encouraged to attend Pulmonary Rehabilitation for the exercise    Continue Psychosocial Services   Follow up required by staff       Education: Education Goals: Education classes will be provided on a weekly basis, covering required topics. Participant will state understanding/return demonstration of topics presented.  Learning Barriers/Preferences: Learning Barriers/Preferences - 10/06/17 1426      Learning Barriers/Preferences   Learning Barriers  None    Learning Preferences  None       Education Topics:  Initial Evaluation Education: - Verbal, written and demonstration of respiratory meds, oximetry and breathing techniques. Instruction on use of nebulizers and MDIs and importance of monitoring MDI activations.   Pulmonary Rehab from 11/19/2017 in [facility] Cardiac and Pulmonary Rehab  Date  10/06/17  Educator  [facility]  Instruction Review Code  1- Verbalizes Understanding      General Nutrition Guidelines/Fats and Fiber: -Group instruction provided by verbal, written material, models and posters to present the general guidelines for heart healthy nutrition. Gives an explanation and review of dietary fats and fiber.   Pulmonary Rehab from 11/19/2017 in [facility] Cardiac and Pulmonary Rehab  Date  11/12/17  Educator  [facility]  Instruction Review Code  1- Verbalizes Understanding      Controlling Sodium/Reading Food Labels: -Group verbal and written material supporting the discussion of sodium use  in heart healthy nutrition. Review and explanation with models, verbal and written materials for utilization of the food label.   Pulmonary Rehab from 11/19/2017 in [facility] Cardiac and Pulmonary Rehab  Date  11/19/17  Educator  [facility]  Instruction Review Code  1- Verbalizes Understanding      Exercise Physiology & General Exercise Guidelines: - Group verbal and written instruction with  models to review the exercise physiology of the cardiovascular system and associated critical values. Provides general exercise guidelines with specific guidelines to those with heart or lung disease.    Aerobic Exercise & Resistance Training: - Gives group verbal and written instruction on the various components of exercise. Focuses on aerobic and resistive training programs and the benefits of this training and how to safely progress through these programs.   Flexibility, Balance, Mind/Body Relaxation: Provides group verbal/written instruction on the benefits of flexibility and balance training, including mind/body exercise modes such as yoga, pilates and tai chi.  Demonstration and skill practice provided.   Stress and Anxiety: - Provides group verbal and written instruction about the health risks of elevated stress and causes of high stress.  Discuss the correlation between heart/lung disease and anxiety and treatment options. Review healthy ways to manage with stress and anxiety.   Depression: - Provides group verbal and written instruction on the correlation between heart/lung disease and depressed mood, treatment options, and the stigmas associated with seeking treatment.   Exercise & Equipment Safety: - Individual verbal instruction and demonstration of equipment use and safety with use of the equipment.   Pulmonary Rehab from 11/19/2017 in [facility] Cardiac and Pulmonary Rehab  Date  10/06/17  Educator  [facility]  Instruction Review Code  1- Verbalizes Understanding      Infection Prevention: - Provides verbal and written material to individual with discussion of infection control including proper hand washing and proper equipment cleaning during exercise session.   Pulmonary Rehab from 11/19/2017 in [facility] Cardiac and Pulmonary Rehab  Date  10/06/17  Educator  [facility]  Instruction Review Code  1- Verbalizes Understanding      Falls Prevention: - Provides verbal and written material to  individual with discussion of falls prevention and safety.   Pulmonary Rehab from 11/19/2017 in [facility] Cardiac and Pulmonary Rehab  Date  10/06/17  Educator  [facility]  Instruction Review Code  1- Verbalizes Understanding      Diabetes: - Individual verbal and written instruction to review signs/symptoms of diabetes, desired ranges of glucose level fasting, after meals and with exercise. Advice that pre and post exercise glucose checks will be done for 3 sessions at entry of program.   Chronic Lung Diseases: - Group verbal and written instruction to review updates, respiratory medications, advancements in procedures and treatments. Discuss use of supplemental oxygen including available portable oxygen systems, continuous and intermittent flow rates, concentrators, personal use and safety guidelines. Review proper use of inhaler and spacers. Provide informative websites for self-education.    Pulmonary Rehab from 11/19/2017 in [facility] Cardiac and Pulmonary Rehab  Date  11/14/17  Educator  [facility]  Instruction Review Code  -- Shona Needles to walk during class, heard most from his physician]      Energy Conservation: - Provide group verbal and written instruction for methods to conserve energy, plan and organize activities. Instruct on pacing techniques, use of adaptive equipment and posture/positioning to relieve shortness of breath.   Pulmonary Rehab from 11/19/2017 in [facility] Cardiac and Pulmonary Rehab  Date  10/22/17  Educator  [facility]  Instruction Review Code  1- Verbalizes Understanding      Triggers and Exacerbations: - Group verbal and written instruction to review types of environmental triggers and ways to prevent exacerbations. Discuss weather changes, air quality and the benefits of nasal washing. Review warning signs and symptoms to help prevent infections. Discuss techniques for effective airway clearance, coughing, and vibrations.   AED/CPR: - Group verbal and written instruction with the use of models  to demonstrate the basic use of the AED with the basic ABC's of resuscitation.   Pulmonary Rehab from 11/19/2017 in [facility] Cardiac and Pulmonary Rehab  Date  10/17/17  Educator  [facility]  Instruction Review Code  1- Actuary and Physiology of the Lungs: - Group verbal and written instruction with the use of models to provide basic lung anatomy and physiology related to function, structure and complications of lung disease.   Anatomy & Physiology of the Heart: - Group verbal and written instruction and models provide basic cardiac anatomy and physiology, with the coronary electrical and arterial systems. Review of Valvular disease and Heart Failure   Cardiac Medications: - Group verbal and written instruction to review commonly prescribed medications for heart disease. Reviews the medication, class of the drug, and side effects.   Pulmonary Rehab from 11/19/2017 in [facility] Cardiac and Pulmonary Rehab  Date  10/15/17  Educator  [facility]  Instruction Review Code  1- Verbalizes Understanding      Know Your Numbers and Risk Factors: -Group verbal and written instruction about important numbers in your health.  Discussion of what are risk factors and how they play a role in the disease process.  Review of Cholesterol, Blood Pressure, Diabetes, and BMI and the role they play in your overall health.   Sleep Hygiene: -Provides group verbal and written instruction about how sleep can affect your health.  Define sleep hygiene, discuss sleep cycles and impact of sleep habits. Review good sleep hygiene tips.    Other: -Provides group and verbal instruction on various topics (see comments)    Knowledge Questionnaire Score: Knowledge Questionnaire Score - 10/06/17 1405      Knowledge Questionnaire Score   Pre Score  12/18   Reviewed with patient       Core Components/Risk Factors/Patient Goals at Admission: Personal Goals and Risk Factors at Admission - 10/06/17 1431       Core Components/Risk Factors/Patient Goals on Admission    Weight Management  Yes;Weight Maintenance    Intervention  Weight Management: Develop a combined nutrition and exercise program designed to reach desired caloric intake, while maintaining appropriate intake of nutrient and fiber, sodium and fats, and appropriate energy expenditure required for the weight goal.;Weight Management: Provide education and appropriate resources to help participant work on and attain dietary goals.;Weight Management/Obesity: Establish reasonable short term and long term weight goals.    Admit Weight  155 lb 14.4 oz (70.7 kg)    Goal Weight: Short Term  155 lb (70.3 kg)    Goal Weight: Long Term  155 lb (70.3 kg)    Expected Outcomes  Short Term: Continue to assess and modify interventions until short term weight is achieved;Long Term: Adherence to nutrition and physical activity/exercise program aimed toward attainment of established weight goal;Weight Maintenance: Understanding of the daily nutrition guidelines, which includes 25-35% calories from fat, 7% or less cal from saturated fats, less than 23m cholesterol, less than 1.5gm of sodium, & 5 or more servings of fruits and vegetables  daily;Understanding recommendations for meals to include 15-35% energy as protein, 25-35% energy from fat, 35-60% energy from carbohydrates, less than 2106m of dietary cholesterol, 20-35 gm of total fiber daily;Understanding of distribution of calorie intake throughout the day with the consumption of 4-5 meals/snacks    Improve shortness of breath with ADL's  Yes    Intervention  Provide education, individualized exercise plan and daily activity instruction to help decrease symptoms of SOB with activities of daily living.    Expected Outcomes  Long Term: Be able to perform more ADLs without symptoms or delay the onset of symptoms;Short Term: Improve cardiorespiratory fitness to achieve a reduction of symptoms when performing ADLs        Core Components/Risk Factors/Patient Goals Review:  Goals and Risk Factor Review    Row Name 11/07/17 1153             Core Components/Risk Factors/Patient Goals Review   Personal Goals Review  Weight Management/Obesity;Improve shortness of breath with ADL's       Review  Patient has been doing well with his weight. He gets short of breath when he eats. When he lays down and gets up he gets short of breath. Grocery shopping is hard for him if he takes it slow. He  has his oxygen in his truck and takes his oxygen with him at all times.        Expected Outcomes  Short: attened [facility] to improve ADLs. Long: Maintain ADLs independently while checking oxygen.          Core Components/Risk Factors/Patient Goals at Discharge (Final Review):  Goals and Risk Factor Review - 11/07/17 1153      Core Components/Risk Factors/Patient Goals Review   Personal Goals Review  Weight Management/Obesity;Improve shortness of breath with ADL's    Review  Patient has been doing well with his weight. He gets short of breath when he eats. When he lays down and gets up he gets short of breath. Grocery shopping is hard for him if he takes it slow. He  has his oxygen in his truck and takes his oxygen with him at all times.     Expected Outcomes  Short: attened [facility] to improve ADLs. Long: Maintain ADLs independently while checking oxygen.       ITP Comments: ITP Comments    Row Name 10/06/17 1403 10/27/17 0835 11/24/17 0837       ITP Comments  Medical Evaluation completed. Chart sent for review and changes to Dr. KCaryl Comesfor Dr. MEmily FilbertDirector of [place] Diagnosis can be found in [facility] encounter 09/02/16  30 day review completed. ITP sent to Dr. [place]mily FilbertDirector of [place] Continue with ITP unless changes are made by physician  30 day review completed. ITP sent to Dr. MEmily FilbertDirector of [place] Continue with ITP unless changes are made by physician        Comments: 30 day  review

## 2017-11-26 ENCOUNTER — Encounter: Payer: Medicaid Other | Admitting: *Deleted

## 2017-11-26 DIAGNOSIS — J449 Chronic obstructive pulmonary disease, unspecified: Secondary | ICD-10-CM

## 2017-11-26 NOTE — Progress Notes (Signed)
Daily Session Note  Patient Details  Name: Chris Case MRN: [id number] Date of Birth: [birth year] Referring Provider:     Pulmonary Rehab from 10/06/2017 in [facility] Cardiac and Pulmonary Rehab  Referring Provider  Ancil Linsey MD      Encounter Date: 11/26/2017  Check In: Session Check In - 11/26/17 1125      Check-In   Supervising physician immediately available to respond to emergencies  [facility] immediately available ER MD    Physician(s)  Dr. Jimmye Norman and Darl Householder    Location  [facility]-Cardiac & Pulmonary Rehab    Staff Present  Renita Papa, RN BSN;Jessica Luan Pulling, MA, RCEP, CCRP, Exercise Physiologist;Joseph Tessie Fass RCP,RRT,BSRT    Medication changes reported      No    Fall or balance concerns reported     No    Warm-up and Cool-down  Performed as group-led instruction    Resistance Training Performed  Yes    VAD Patient?  No    PAD/SET Patient?  No      Pain Assessment   Currently in Pain?  No/denies          Social History   Tobacco Use  Smoking Status Former Smoker  . Packs/day: 3.00  . Years: 20.00  . Pack years: 60.00  . Types: Cigarettes  . Last attempt to quit: 04/11/2016  . Years since quitting: 1.6  Smokeless Tobacco Never Used    Goals Met:  Proper associated with RPD/PD & O2 Sat Independence with exercise equipment Using PLB without cueing & demonstrates good technique Exercise tolerated well No report of cardiac concerns or symptoms Strength training completed today  Goals Unmet:  Not Applicable  Comments: Pt able to follow exercise prescription today without complaint.  Will continue to monitor for progression.    Dr. Emily Filbert is Medical Director for [place] and [facility].

## 2017-11-28 ENCOUNTER — Encounter: Payer: Medicaid Other | Admitting: *Deleted

## 2017-11-28 DIAGNOSIS — J449 Chronic obstructive pulmonary disease, unspecified: Secondary | ICD-10-CM | POA: Diagnosis not present

## 2017-11-28 NOTE — Progress Notes (Signed)
Daily Session Note  Patient Details  Name: Kanden Carey MRN: [id number] Date of Birth: 12-16-1970 Referring Provider:     Pulmonary Rehab from 10/06/2017 in [facility] Cardiac and Pulmonary Rehab  Referring Provider  Ancil Linsey MD      Encounter Date: 11/28/2017  Check In: Session Check In - 11/28/17 1147      Check-In   Supervising physician immediately available to respond to emergencies  [facility] immediately available ER MD    Physician(s)  Drs. Kinner and Chartered loss adjuster & Pulmonary Rehab    Staff Present  Renita Papa, RN BSN;Raechal Raben Luan Pulling, [place], RCEP, CCRP, Exercise Physiologist;Joseph Tessie Fass RCP,RRT,BSRT    Medication changes reported      No    Fall or balance concerns reported     No    Warm-up and Cool-down  Performed as group-led instruction    Resistance Training Performed  Yes    VAD Patient?  No    PAD/SET Patient?  No      Pain Assessment   Currently in Pain?  No/denies          Social History   Tobacco Use  Smoking Status Former Smoker  . Packs/day: 3.00  . Years: 20.00  . Pack years: 60.00  . Types: Cigarettes  . Last attempt to quit: 04/11/2016  . Years since quitting: 1.6  Smokeless Tobacco Never Used    Goals Met:  Proper associated with RPD/PD & O2 Sat Independence with exercise equipment Using PLB without cueing & demonstrates good technique Exercise tolerated well No report of cardiac concerns or symptoms Strength training completed today  Goals Unmet:  Not Applicable  Comments: Pt able to follow exercise prescription today without complaint.  Will continue to monitor for progression.    Dr. Emily Filbert is Medical Director for [place] and [facility] Pulmonary Rehabilitation.

## 2017-12-03 ENCOUNTER — Encounter: Payer: Medicaid Other | Attending: Specialist | Admitting: *Deleted

## 2017-12-03 DIAGNOSIS — J449 Chronic obstructive pulmonary disease, unspecified: Secondary | ICD-10-CM | POA: Diagnosis present

## 2017-12-03 NOTE — Progress Notes (Signed)
Daily Session Note  Patient Details  Name: Chris Case MRN: [id number] Date of Birth: 1970/05/24 Referring Provider:     Pulmonary Rehab from 10/06/2017 in [facility] Cardiac and Pulmonary Rehab  Referring Provider  Ancil Linsey MD      Encounter Date: 12/03/2017  Check In: Session Check In - 12/03/17 1132      Check-In   Supervising physician immediately available to respond to emergencies  [facility] immediately available ER MD    Physician(s)  Dr. Joni Fears and Jimmye Norman    Location  [facility]-Cardiac & Pulmonary Rehab    Staff Present  Renita Papa, RN BSN;Jessica Luan Pulling, MA, RCEP, CCRP, Exercise Physiologist;Joseph Tessie Fass RCP,RRT,BSRT    Medication changes reported      No    Fall or balance concerns reported     No    Warm-up and Cool-down  Performed as group-led instruction    Resistance Training Performed  Yes    VAD Patient?  No    PAD/SET Patient?  No      Pain Assessment   Currently in Pain?  No/denies          Social History   Tobacco Use  Smoking Status Former Smoker  . Packs/day: 3.00  . Years: 20.00  . Pack years: 60.00  . Types: Cigarettes  . Last attempt to quit: 04/11/2016  . Years since quitting: 1.6  Smokeless Tobacco Never Used    Goals Met:  Proper associated with RPD/PD & O2 Sat Independence with exercise equipment Using PLB without cueing & demonstrates good technique Exercise tolerated well No report of cardiac concerns or symptoms Strength training completed today  Goals Unmet:  Not Applicable  Comments: Pt able to follow exercise prescription today without complaint.  Will continue to monitor for progression.    Dr. Emily Filbert is Medical Director for [place] and [facility].

## 2017-12-05 ENCOUNTER — Encounter: Payer: Medicaid Other | Admitting: *Deleted

## 2017-12-05 DIAGNOSIS — J449 Chronic obstructive pulmonary disease, unspecified: Secondary | ICD-10-CM

## 2017-12-05 NOTE — Progress Notes (Signed)
Daily Session Note  Patient Details  Name: Chris Case MRN: [id number] Date of Birth: 06/16/1970 Referring Provider:     Pulmonary Rehab from 10/06/2017 in [facility] Cardiac and Pulmonary Rehab  Referring Provider  Ancil Linsey MD      Encounter Date: 12/05/2017  Check In: Session Check In - 12/05/17 1137      Check-In   Supervising physician immediately available to respond to emergencies  [facility] immediately available ER MD    Physician(s)  Patient taken to see Dr. Sabra Heck today. Chart Reviewed and signed.     Location  [facility]-Cardiac & Pulmonary Rehab    Staff Present  Renita Papa, RN BSN;Jessica Luan Pulling, MA, RCEP, CCRP, Exercise Physiologist;Nana Addai, RN BSN    Medication changes reported      No    Fall or balance concerns reported     No    Warm-up and Cool-down  Performed as group-led instruction    Resistance Training Performed  Yes    VAD Patient?  No    PAD/SET Patient?  No      Pain Assessment   Currently in Pain?  No/denies          Social History   Tobacco Use  Smoking Status Former Smoker  . Packs/day: 3.00  . Years: 20.00  . Pack years: 60.00  . Types: Cigarettes  . Last attempt to quit: 04/11/2016  . Years since quitting: 1.6  Smokeless Tobacco Never Used    Goals Met:  Proper associated with RPD/PD & O2 Sat Independence with exercise equipment Using PLB without cueing & demonstrates good technique Exercise tolerated well No report of cardiac concerns or symptoms Strength training completed today  Goals Unmet:  Not Applicable  Comments: Pt able to follow exercise prescription today without complaint.  Will continue to monitor for progression.    Dr. Emily Filbert is Medical Director for [place] and [facility].

## 2017-12-08 DIAGNOSIS — J449 Chronic obstructive pulmonary disease, unspecified: Secondary | ICD-10-CM

## 2017-12-08 NOTE — Progress Notes (Signed)
Daily Session Note  Patient Details  Name: Chris Case MRN: [id number] Date of Birth: 10/29/1970 Referring Provider:     Pulmonary Rehab from 10/06/2017 in [facility] Cardiac and Pulmonary Rehab  Referring Provider  Ancil Linsey MD      Encounter Date: 12/08/2017  Check In:      Social History   Tobacco Use  Smoking Status Former Smoker  . Packs/day: 3.00  . Years: 20.00  . Pack years: 60.00  . Types: Cigarettes  . Last attempt to quit: 04/11/2016  . Years since quitting: 1.6  Smokeless Tobacco Never Used    Goals Met:  Proper associated with RPD/PD & O2 Sat Independence with exercise equipment Exercise tolerated well Strength training completed today  Goals Unmet:  Not Applicable  Comments: Pt able to follow exercise prescription today without complaint.  Will continue to monitor for progression.    Dr. Emily Filbert is Medical Director for [place] and [facility].

## 2017-12-16 ENCOUNTER — Telehealth: Payer: Self-pay

## 2017-12-16 NOTE — Telephone Encounter (Signed)
Left message for patient to call back  

## 2017-12-17 ENCOUNTER — Encounter: Payer: Medicaid Other | Admitting: *Deleted

## 2017-12-17 DIAGNOSIS — J449 Chronic obstructive pulmonary disease, unspecified: Secondary | ICD-10-CM

## 2017-12-17 NOTE — Progress Notes (Signed)
Daily Session Note  Patient Details  Name: Chris Case MRN: [id number] Date of Birth: 05/24/1970 Referring Provider:     Pulmonary Rehab from 10/06/2017 in [facility] Cardiac and Pulmonary Rehab  Referring Provider  Fleming, [place] MD      Encounter Date: 12/17/2017  Check In: Session Check In - 12/17/17 1135      Check-In   Supervising physician immediately available to respond to emergencies  [facility] immediately available ER MD    Physician(s)  Drs. McShane and Williams    Location  [facility]-Cardiac & Pulmonary Rehab    Staff Present  Meredith Craven, RN BSN; , MA, RCEP, CCRP, Exercise Physiologist;Joseph Hood RCP,RRT,BSRT    Medication changes reported      No    Fall or balance concerns reported     No    Warm-up and Cool-down  Performed as group-led instruction    Resistance Training Performed  Yes    VAD Patient?  No    PAD/SET Patient?  No      Pain Assessment   Currently in Pain?  No/denies          Social History   Tobacco Use  Smoking Status Former Smoker  . Packs/day: 3.00  . Years: 20.00  . Pack years: 60.00  . Types: Cigarettes  . Last attempt to quit: 04/11/2016  . Years since quitting: 1.6  Smokeless Tobacco Never Used    Goals Met:  Proper associated with RPD/PD & O2 Sat Independence with exercise equipment Using PLB without cueing & demonstrates good technique Exercise tolerated well No report of cardiac concerns or symptoms Strength training completed today  Goals Unmet:  Not Applicable  Comments: Pt able to follow exercise prescription today without complaint.  Will continue to monitor for progression.    Dr. Mark Miller is Medical Director for [facility] and [facility]. 

## 2017-12-19 ENCOUNTER — Encounter: Payer: Medicaid Other | Admitting: *Deleted

## 2017-12-19 DIAGNOSIS — J449 Chronic obstructive pulmonary disease, unspecified: Secondary | ICD-10-CM

## 2017-12-19 NOTE — Progress Notes (Signed)
Daily Session Note  Patient Details  Name: Chris Case MRN: [id number] Date of Birth: 1970-07-20 Referring Provider:     Pulmonary Rehab from 10/06/2017 in [facility] Cardiac and Pulmonary Rehab  Referring Provider  Ancil Linsey MD      Encounter Date: 12/19/2017  Check In: Session Check In - 12/19/17 1119      Check-In   Supervising physician immediately available to respond to emergencies  [facility] immediately available ER MD    Physician(s)  Dr. Quentin Cornwall and Joni Fears    Location  [facility]-Cardiac & Pulmonary Rehab    Staff Present  Renita Papa, RN BSN;Joseph Hood RCP,RRT,BSRT;Amanda Oletta Darter, [place], ACSM CEP, Exercise Physiologist    Medication changes reported      No    Fall or balance concerns reported     No    Warm-up and Cool-down  Performed as group-led instruction    Resistance Training Performed  Yes    VAD Patient?  No    PAD/SET Patient?  No      Pain Assessment   Currently in Pain?  No/denies          Social History   Tobacco Use  Smoking Status Former Smoker  . Packs/day: 3.00  . Years: 20.00  . Pack years: 60.00  . Types: Cigarettes  . Last attempt to quit: 04/11/2016  . Years since quitting: 1.6  Smokeless Tobacco Never Used    Goals Met:  Proper associated with RPD/PD & O2 Sat Independence with exercise equipment Exercise tolerated well No report of cardiac concerns or symptoms Strength training completed today  Goals Unmet:  Not Applicable  Comments: Pt able to follow exercise prescription today without complaint.  Will continue to monitor for progression.    Dr. Emily Filbert is Medical Director for [place] and [facility].

## 2017-12-22 DIAGNOSIS — J449 Chronic obstructive pulmonary disease, unspecified: Secondary | ICD-10-CM

## 2017-12-22 NOTE — Progress Notes (Signed)
Pulmonary Individual Treatment Plan  Patient Details  Name: Chris Case MRN: [id number] Date of Birth: November 22, 1970 Referring Provider:     Pulmonary Rehab from 10/06/2017 in [facility]  Referring Provider  Ancil Linsey MD      Initial Encounter Date:    Pulmonary Rehab from 10/06/2017 in [facility] Cardiac and Pulmonary Rehab  Date  10/06/17      Visit Diagnosis: Chronic obstructive pulmonary disease, unspecified COPD type ([place])  [name] Home Medications on Admission:  Current Outpatient Medications:  .  albuterol (PROVENTIL) (2.5 MG/3ML) 0.083% nebulizer solution, Inhale 3 mLs into the lungs every 4 (four) hours as needed for wheezing or shortness of breath., Disp: 75 mL, Rfl: 12 .  aspirin EC 81 MG EC tablet, Take 1 tablet (81 mg total) by mouth daily., Disp: 30 tablet, Rfl: 6 .  furosemide (LASIX) 20 MG tablet, Take 20 mg by mouth daily., Disp: , Rfl: 0 .  mometasone-formoterol (DULERA) 200-5 MCG/ACT AERO, Inhale 2 puffs into the lungs 2 (two) times daily., Disp: 1 Inhaler, Rfl: 5 .  OXYGEN, Inhale 2 L into the lungs., Disp: , Rfl:   Past Medical History: Past Medical History:  Diagnosis Date  . COPD (chronic obstructive pulmonary disease) ([place])   . Emphysema lung ([place])   . 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 Packs/day: 3.00  . Years: 20.00  . Pack years: 60.00  . Types: Cigarettes  . Last attempt to quit: 04/11/2016  . Years since quitting: 1.6  Smokeless Tobacco Never Used    Labs: Recent Review Flowsheet Data    There is no flowsheet data to display.       Pulmonary Assessment Scores: Pulmonary Assessment Scores    Row Name 10/06/17 1404         ADL UCSD   ADL Phase  Entry     SOB Score total  47     Rest  4     Walk  3     Stairs  2     Bath  2     Dress  2     Shop  2       CAT Score   CAT Score  22       mMRC Score    mMRC Score  2        Pulmonary Function Assessment: Pulmonary Function Assessment - 10/06/17 1408      Initial Spirometry Results   FVC%  30 %   Test done on 06/19/16 Care Everywhere   FEV1%  17 %    FEV1/FVC Ratio  46      Post Bronchodilator Spirometry Results   FVC%  35 %   Test done on 06/19/16 Care Everywhere   FEV1%  20 %    FEV1/FVC Ratio  46      Breath   Bilateral Breath Sounds  Decreased    Shortness of Breath  Yes;Panic with Shortness of Breath;Limiting activity       Exercise Target Goals: Exercise Program Goal: Individual exercise prescription set using results from initial 6 min walk test and THRR while considering  patient's activity barriers and safety.   Exercise Prescription Goal: Initial exercise prescription builds to 30-45 minutes a day of aerobic activity, 2-3 days per week.  Home exercise guidelines will be given to patient during program as  part of exercise prescription that the participant will acknowledge.  Activity Barriers & Risk Stratification: Activity Barriers & Cardiac Risk Stratification - 10/06/17 1422      Activity Barriers & Cardiac Risk Stratification   Activity Barriers  Deconditioning;Shortness of Breath;Muscular Weakness       6 Minute Walk: 6 Minute Walk    Row Name 10/06/17 1420         6 Minute Walk   Phase  Initial     Distance  920 feet     Walk Time  6 minutes     # of Rest Breaks  0     MPH  1.74     METS  3.75     RPE  11     Perceived Dyspnea   2     VO2 Peak  13.14     Symptoms  Yes (comment)     Comments  SOB     Resting HR  78 bpm     Resting BP  130/60     Resting Oxygen Saturation   96 %     Exercise Oxygen Saturation  during 6 min walk  85 %     Max Ex. HR  102 bpm     Max Ex. BP  138/74     2 Minute Post BP  132/74       Interval HR   1 Minute HR  93     2 Minute HR  94     3 Minute HR  96     4 Minute HR  100     5 Minute HR  101     6 Minute HR  102     2 Minute Post HR  87     Interval  Heart Rate?  Yes       Interval Oxygen   Interval Oxygen?  Yes     Baseline Oxygen Saturation %  96 %     1 Minute Oxygen Saturation %  92 %     1 Minute Liters of Oxygen  3 L     2 Minute Oxygen Saturation %  92 % 2:33 83%     2 Minute Liters of Oxygen  3 L     3 Minute Oxygen Saturation %  88 %     3 Minute Liters of Oxygen  3 L     4 Minute Oxygen Saturation %  86 %     4 Minute Liters of Oxygen  3 L     5 Minute Oxygen Saturation %  86 %     5 Minute Liters of Oxygen  3 L     6 Minute Oxygen Saturation %  86 % 6:05 85%     6 Minute Liters of Oxygen  3 L     2 Minute Post Oxygen Saturation %  94 %     2 Minute Post Liters of Oxygen  3 L       Oxygen Initial Assessment: Oxygen Initial Assessment - 10/06/17 1430      Home Oxygen   Home Oxygen Device  Home Concentrator;E-Tanks    Sleep Oxygen Prescription  Continuous    Liters per minute  2    Home Exercise Oxygen Prescription  Continuous    Liters per minute  3    Home at Rest Exercise Oxygen Prescription  Continuous    Liters per minute  3    Compliance with Home Oxygen Use  Yes      Initial 6 min Walk   Oxygen Used  Continuous    Liters per minute  3      Program Oxygen Prescription   Program Oxygen Prescription  Continuous    Liters per minute  3      Intervention   Short Term Goals  To learn and exhibit compliance with exercise, home and travel O2 prescription;To learn and understand importance of monitoring SPO2 with pulse oximeter and demonstrate accurate use of the pulse oximeter.;To learn and understand importance of maintaining oxygen saturations>88%;To learn and demonstrate proper pursed lip breathing techniques or other breathing techniques.;To learn and demonstrate proper use of respiratory medications    Long  Term Goals  Exhibits compliance with exercise, home and travel O2 prescription;Verbalizes importance of monitoring SPO2 with pulse oximeter and return demonstration;Maintenance of O2  saturations>88%;Exhibits proper breathing techniques, such as pursed lip breathing or other method taught during program session;Compliance with respiratory medication;Demonstrates proper use of MDI's       Oxygen Re-Evaluation: Oxygen Re-Evaluation    Row Name 10/13/17 1605 11/07/17 1143 12/03/17 1538         Program Oxygen Prescription   Program Oxygen Prescription  -  Continuous  Continuous     Liters per minute  -  3  2     Comments  -  -  patient has been in the high 90's while exercising       Home Oxygen   Home Oxygen Device  -  Home Concentrator;E-Tanks  Home Concentrator;E-Tanks     Sleep Oxygen Prescription  -  Continuous  Continuous     Liters per minute  -  2  2     Home Exercise Oxygen Prescription  -  Continuous  Continuous     Liters per minute  -  3  3     Home at Rest Exercise Oxygen Prescription  -  Continuous  Continuous     Liters per minute  -  3  3     Compliance with Home Oxygen Use  -  Yes  Yes       Goals/Expected Outcomes   Short Term Goals  To learn and understand importance of monitoring SPO2 with pulse oximeter and demonstrate accurate use of the pulse oximeter.;To learn and understand importance of maintaining oxygen saturations>88%;To learn and demonstrate proper pursed lip breathing techniques or other breathing techniques.  To learn and understand importance of monitoring SPO2 with pulse oximeter and demonstrate accurate use of the pulse oximeter.;To learn and understand importance of maintaining oxygen saturations>88%;To learn and demonstrate proper pursed lip breathing techniques or other breathing techniques.;To learn and demonstrate proper use of respiratory medications;To learn and exhibit compliance with exercise, home and travel O2 prescription  To learn and understand importance of monitoring SPO2 with pulse oximeter and demonstrate accurate use of the pulse oximeter.;To learn and understand importance of maintaining oxygen saturations>88%;To learn  and demonstrate proper pursed lip breathing techniques or other breathing techniques.;To learn and demonstrate proper use of respiratory medications;To learn and exhibit compliance with exercise, home and travel O2 prescription     Long  Term Goals  Verbalizes importance of monitoring SPO2 with pulse oximeter and return demonstration;Maintenance of O2 saturations>88%;Exhibits proper breathing techniques, such as pursed lip breathing or other method taught during program session  Verbalizes importance of monitoring SPO2 with pulse oximeter and return demonstration;Maintenance of O2 saturations>88%;Exhibits proper breathing techniques, such as pursed lip breathing or other method taught  during program session;Compliance with respiratory medication;Demonstrates proper use of MDI's;Exhibits compliance with exercise, home and travel O2 prescription  Verbalizes importance of monitoring SPO2 with pulse oximeter and return demonstration;Maintenance of O2 saturations>88%;Exhibits proper breathing techniques, such as pursed lip breathing or other method taught during program session;Compliance with respiratory medication;Demonstrates proper use of MDI's;Exhibits compliance with exercise, home and travel O2 prescription     Comments  Reviewed PLB technique with pt.  Talked about how it work and it's important to maintaining his exercise saturations.    Patient is taking his Albuterol nebulizer every 4 hours and as needed. He also takes advair and spiriva. Informed patient to take his medications in the proper order to better benifit his breathing. He should take is albuterol nebulizer or albuterol MDI before he takes his other inhalers.  Patient has been doing well in the class and has maintain oxygen saturations in the high 90's while exercising on 2 liters. Patient wants to come off oxygen if he can. We will continue to titrate oxygen if oxygen saturations can be maintained above 88 pecent while exercising.      Goals/Expected Outcomes  Short: Become more profiecient at using PLB.   Long: Become independent at using PLB.  Short: use medication as prescribed. Long: Take medications independently and efficiently  Short: decrease oxygen to 1 liter for exercise. Long: maintain oxygen saturation on Room air while exercising.        Oxygen Discharge (Final Oxygen Re-Evaluation): Oxygen Re-Evaluation - 12/03/17 1538      Program Oxygen Prescription   Program Oxygen Prescription  Continuous    Liters per minute  2    Comments  patient has been in the high 90's while exercising      Home Oxygen   Home Oxygen Device  Home Concentrator;E-Tanks    Sleep Oxygen Prescription  Continuous    Liters per minute  2    Home Exercise Oxygen Prescription  Continuous    Liters per minute  3    Home at Rest Exercise Oxygen Prescription  Continuous    Liters per minute  3    Compliance with Home Oxygen Use  Yes      Goals/Expected Outcomes   Short Term Goals  To learn and understand importance of monitoring SPO2 with pulse oximeter and demonstrate accurate use of the pulse oximeter.;To learn and understand importance of maintaining oxygen saturations>88%;To learn and demonstrate proper pursed lip breathing techniques or other breathing techniques.;To learn and demonstrate proper use of respiratory medications;To learn and exhibit compliance with exercise, home and travel O2 prescription    Long  Term Goals  Verbalizes importance of monitoring SPO2 with pulse oximeter and return demonstration;Maintenance of O2 saturations>88%;Exhibits proper breathing techniques, such as pursed lip breathing or other method taught during program session;Compliance with respiratory medication;Demonstrates proper use of MDI's;Exhibits compliance with exercise, home and travel O2 prescription    Comments  Patient has been doing well in the class and has maintain oxygen saturations in the high 90's while exercising on 2 liters. Patient wants  to come off oxygen if he can. We will continue to titrate oxygen if oxygen saturations can be maintained above 88 pecent while exercising.    Goals/Expected Outcomes  Short: decrease oxygen to 1 liter for exercise. Long: maintain oxygen saturation on Room air while exercising.       Initial Exercise Prescription: Initial Exercise Prescription - 10/06/17 1400      Date of Initial Exercise RX and Referring Provider  Date  10/06/17    Referring Provider  Ancil Linsey MD      Oxygen   Oxygen  Continuous    Liters  3      Treadmill   MPH  1.7    Grade  0    Minutes  15    METs  2.3      REL-XR   Level  3    Speed  50    Minutes  15    METs  2.3      T5 Nustep   Level  3    SPM  80    Minutes  15    METs  2.3      Prescription Details   Frequency (times per week)  3    Duration  Progress to 45 minutes of aerobic exercise without signs/symptoms of physical distress      Intensity   THRR 40-80% of Max Heartrate  116-154    Ratings of Perceived Exertion  11-13    Perceived Dyspnea  0-4      Progression   Progression  Continue to progress workloads to maintain intensity without signs/symptoms of physical distress.      Resistance Training   Training Prescription  Yes    Weight  3 lbs    Reps  10-15       Perform Capillary Blood Glucose checks as needed.  Exercise Prescription Changes: Exercise Prescription Changes    Row Name 10/06/17 1400 10/14/17 1300 10/22/17 1200 10/27/17 1400 11/10/17 1400     Response to Exercise   Blood Pressure (Admit)  130/60  120/74  -  128/70  120/64   Blood Pressure (Exercise)  138/74  138/82  -  -  -   Blood Pressure (Exit)  132/74  120/68  -  110/68  126/62   Heart Rate (Admit)  78 bpm  76 bpm  -  81 bpm  88 bpm   Heart Rate (Exercise)  102 bpm  105 bpm  -  102 bpm  98 bpm   Heart Rate (Exit)  87 bpm  90 bpm  -  87 bpm  88 bpm   Oxygen Saturation (Admit)  96 %  92 %  -  91 %  95 %   Oxygen Saturation (Exercise)  85 %  92 %  -   94 %  91 %   Oxygen Saturation (Exit)  94 %  99 %  -  98 %  98 %   Rating of Perceived Exertion (Exercise)  11  13  -  13  11   Perceived Dyspnea (Exercise)  2  3  -  2  1   Symptoms  SOB  SOB  -  SOB  SOB   Comments  walk test results  first full day of exercise  -  -  -   Duration  -  Progress to 45 minutes of aerobic exercise without signs/symptoms of physical distress  -  Continue with 45 min of aerobic exercise without signs/symptoms of physical distress.  Continue with 45 min of aerobic exercise without signs/symptoms of physical distress.   Intensity  -  THRR unchanged  -  THRR unchanged  THRR unchanged     Progression   Progression  -  Continue to progress workloads to maintain intensity without signs/symptoms of physical distress.  -  Continue to progress workloads to maintain intensity without signs/symptoms of physical distress.  Continue to progress workloads to  maintain intensity without signs/symptoms of physical distress.   Average METs  -  2.9  -  2.67  2.7     Resistance Training   Training Prescription  -  Yes  -  Yes  Yes   Weight  -  3 lbs  -  4 lbs  4 lbs   Reps  -  10-15  -  10-15  10-15     Interval Training   Interval Training  -  No  -  No  No     Oxygen   Oxygen  -  Continuous  -  Continuous  Continuous   Liters  -  3  -  3  3     Treadmill   MPH  -  1.7  -  1.7  1.7   Grade  -  0  -  0  0   Minutes  -  15  -  15  15   METs  -  2.3  -  2.3  2.3     REL-XR   Level  -  3  -  3  3   Minutes  -  15  -  15  15   METs  -  3.5  -  3.7  3.8     T5 Nustep   Level  -  3  -  3  3   Minutes  -  15  -  15  15   METs  -  -  -  2  2     Home Exercise Plan   Plans to continue exercise at  -  -  Home (comment) walking  Home (comment) walking  Home (comment) walking   Frequency  -  -  Add 1 additional day to program exercise sessions.  Add 1 additional day to program exercise sessions.  Add 1 additional day to program exercise sessions.   Initial Home Exercises  Provided  -  -  10/22/17  10/22/17  10/22/17   Row Name 11/26/17 1400 12/08/17 1400           Response to Exercise   Blood Pressure (Admit)  128/64  110/60      Blood Pressure (Exit)  112/56  106/58      Heart Rate (Admit)  93 bpm  88 bpm      Heart Rate (Exercise)  116 bpm  101 bpm      Heart Rate (Exit)  90 bpm  82 bpm      Oxygen Saturation (Admit)  93 %  91 %      Oxygen Saturation (Exercise)  93 %  94 %      Oxygen Saturation (Exit)  97 %  98 %      Rating of Perceived Exertion (Exercise)  12  11      Perceived Dyspnea (Exercise)  2  1      Symptoms  SOB  SOB      Duration  Continue with 45 min of aerobic exercise without signs/symptoms of physical distress.  Continue with 45 min of aerobic exercise without signs/symptoms of physical distress.      Intensity  THRR unchanged  THRR unchanged        Progression   Progression  Continue to progress workloads to maintain intensity without signs/symptoms of physical distress.  Continue to progress workloads to maintain intensity without signs/symptoms of physical distress.      Average METs  2.97  3.14        Resistance Training   Training Prescription  Yes  Yes      Weight  4 lbs  4 lbs      Reps  10-15  10-15        Interval Training   Interval Training  No  No        Oxygen   Oxygen  Continuous  Continuous      Liters  3  2        Treadmill   MPH  2.3  2.3      Grade  0.5  0.5      Minutes  15  15      METs  2.92  2.92        REL-XR   Level  4  6      Minutes  15  15      METs  4.1  4.5        T5 Nustep   Level  3  3      Minutes  15  15      METs  2  2        Home Exercise Plan   Plans to continue exercise at  Home (comment) walking  Home (comment) walking      Frequency  Add 1 additional day to program exercise sessions.  Add 2 additional days to program exercise sessions.      Initial Home Exercises Provided  10/22/17  10/22/17         Exercise Comments: Exercise Comments    Row Name 10/13/17 1508            Exercise Comments  First full day of exercise!  Patient was oriented to gym and equipment including functions, settings, policies, and procedures.  [name] individual exercise prescription and treatment plan were reviewed.  All starting workloads were established based on the results of the 6 minute walk test done at initial orientation visit.  The plan for exercise progression was also introduced and progression will be customized based on patient's performance and goals.          Exercise Goals and Review: Exercise Goals    Row Name 10/06/17 1424             Exercise Goals   Increase Physical Activity  Yes       Intervention  Provide advice, education, support and counseling about physical activity/exercise needs.;Develop an individualized exercise prescription for aerobic and resistive training based on initial evaluation findings, risk stratification, comorbidities and participant's personal goals.       Expected Outcomes  Short Term: Attend rehab on a regular basis to increase amount of physical activity.;Long Term: Add in home exercise to make exercise part of routine and to increase amount of physical activity.;Long Term: Exercising regularly at least 3-5 days a week.       Increase Strength and Stamina  Yes       Intervention  Provide advice, education, support and counseling about physical activity/exercise needs.;Develop an individualized exercise prescription for aerobic and resistive training based on initial evaluation findings, risk stratification, comorbidities and participant's personal goals.       Expected Outcomes  Short Term: Increase workloads from initial exercise prescription for resistance, speed, and METs.;Short Term: Perform resistance training exercises routinely during rehab and add in resistance training at home;Long Term: Improve cardiorespiratory fitness, muscular endurance and strength as measured by increased METs and functional capacity (6MWT)  Able to understand and use rate of perceived exertion (RPE) scale  Yes       Intervention  Provide education and explanation on how to use RPE scale       Expected Outcomes  Short Term: Able to use RPE daily in rehab to express subjective intensity level;Long Term:  Able to use RPE to guide intensity level when exercising independently       Able to understand and use Dyspnea scale  Yes       Intervention  Provide education and explanation on how to use Dyspnea scale       Expected Outcomes  Short Term: Able to use Dyspnea scale daily in rehab to express subjective sense of shortness of breath during exertion;Long Term: Able to use Dyspnea scale to guide intensity level when exercising independently       Knowledge and understanding of Target Heart Rate Range (THRR)  Yes       Intervention  Provide education and explanation of THRR including how the numbers were predicted and where they are located for reference       Expected Outcomes  Short Term: Able to state/look up THRR;Short Term: Able to use daily as guideline for intensity in rehab;Long Term: Able to use THRR to govern intensity when exercising independently       Able to check pulse independently  Yes       Intervention  Provide education and demonstration on how to check pulse in carotid and radial arteries.;Review the importance of being able to check your own pulse for safety during independent exercise       Expected Outcomes  Short Term: Able to explain why pulse checking is important during independent exercise;Long Term: Able to check pulse independently and accurately       Understanding of Exercise Prescription  Yes       Intervention  Provide education, explanation, and written materials on patient's individual exercise prescription       Expected Outcomes  Short Term: Able to explain program exercise prescription;Long Term: Able to explain home exercise prescription to exercise independently          Exercise Goals Re-Evaluation  : Exercise Goals Re-Evaluation    Row Name 10/13/17 1508 10/22/17 1233 10/27/17 1452 11/10/17 1352 11/26/17 1432     Exercise Goal Re-Evaluation   Exercise Goals Review  Increase Physical Activity;Increase Strength and Stamina;Able to understand and use rate of perceived exertion (RPE) scale;Able to understand and use Dyspnea scale  Increase Physical Activity;Increase Strength and Stamina;Understanding of Exercise Prescription  Increase Physical Activity;Increase Strength and Stamina;Understanding of Exercise Prescription  Increase Physical Activity;Increase Strength and Stamina;Understanding of Exercise Prescription  Increase Physical Activity;Increase Strength and Stamina;Understanding of Exercise Prescription   Comments  Reviewed RPE scale, THR and program prescription with pt today.  Pt voiced understanding and was given a copy of goals to take home.   Reviewed home exercise with pt today.  Pt plans to walk at home for exercise.  He is going to start with one day a week.  Reviewed THR, pulse, RPE, sign and symptoms, and when to call 911 or MD.  Also discussed weather considerations and indoor options.  Pt voiced understanding.  Jabar is doing well in rehab.  He is doing well on 3 liters for exercise but he still gets SOB on teh treadmill.  We will continue to try to increase workloads and monitor his progress.   Amos continues to well in rehab.  He  is steady on his workloads but everything is finally down to RPE of 11 so we will increase his workloads. We will continue to monitor his progression.   Yamato has been doing well in rehab.  He is now up to 2.3 mph on the treadmill.  We will continue to monitor his progression.    Expected Outcomes  Short: Use RPE daily to regulate intensity. Long: Follow program prescription in THR.  Short: Add in at least one extra day a week.  Long: Continue to exercise independently  Short: Increase workloads.  Long: Continue to improve strength and stamina.   Short:  Increase workloads.  Long: Continue to increase physical activity.   Short: Continue to work on [place].  Long: Continue to build strength and stamina.    [place] Name 12/08/17 1406             Exercise Goal Re-Evaluation   Exercise Goals Review  Increase Physical Activity;Increase Strength and Stamina;Understanding of Exercise Prescription       Comments  Handsome continues to do well in rehab.  He is up to level 6 on the XR.  We will continue to monitor his progress.        Expected Outcomes  Short: Increase T5 NuStep.  Long: Continue to improve stamina and possibly reduce oxygen use.           Discharge Exercise Prescription (Final Exercise Prescription Changes): Exercise Prescription Changes - 12/08/17 1400      Response to Exercise   Blood Pressure (Admit)  110/60    Blood Pressure (Exit)  106/58    Heart Rate (Admit)  88 bpm    Heart Rate (Exercise)  101 bpm    Heart Rate (Exit)  82 bpm    Oxygen Saturation (Admit)  91 %    Oxygen Saturation (Exercise)  94 %    Oxygen Saturation (Exit)  98 %    Rating of Perceived Exertion (Exercise)  11    Perceived Dyspnea (Exercise)  1    Symptoms  SOB    Duration  Continue with 45 min of aerobic exercise without signs/symptoms of physical distress.    Intensity  THRR unchanged      Progression   Progression  Continue to progress workloads to maintain intensity without signs/symptoms of physical distress.    Average METs  3.14      Resistance Training   Training Prescription  Yes    Weight  4 lbs    Reps  10-15      Interval Training   Interval Training  No      Oxygen   Oxygen  Continuous    Liters  2      Treadmill   MPH  2.3    Grade  0.5    Minutes  15    METs  2.92      REL-XR   Level  6    Minutes  15    METs  4.5      T5 Nustep   Level  3    Minutes  15    METs  2      Home Exercise Plan   Plans to continue exercise at  Home (comment)   walking   Frequency  Add 2 additional days to program  exercise sessions.    Initial Home Exercises Provided  10/22/17       Nutrition:  Target Goals: Understanding of nutrition guidelines, daily intake of sodium <1562m, cholesterol <  229m, calories 30% from fat and 7% or less from saturated fats, daily to have 5 or more servings of fruits and vegetables.  Biometrics: Pre Biometrics - 10/06/17 1424      Pre Biometrics   Height  5' 7.2" (1.707 m)    Weight  155 lb 14.4 oz (70.7 kg)    Waist Circumference  35 inches    Hip Circumference  37 inches    Waist to Hip Ratio  0.95 %    BMI (Calculated)  24.27        Nutrition Therapy Plan and Nutrition Goals: Nutrition Therapy & Goals - 10/22/17 1238      Nutrition Therapy   Diet  TLC    Protein (specify units)  9oz    Fiber  30 grams   likely not meeting fiber goal   Whole Grain Foods  3 servings   does not usually choose whole grains   Saturated Fats  16 max. grams    Fruits and Vegetables  5 servings/day   8 ideal; does not eat fruit daily    Sodium  2000 grams   does not monitor sodium intake     Personal Nutrition Goals   Nutrition Goal  Start to pay attention to the sodium content in foods, particularly for pre-prepared meals and canned items. Choose foods with less than 3094mof sodium    Personal Goal #2  Increase water consumption, start by swapping one soda for a glass of water per day    Personal Goal #3  Look in the frozen section for vegetables and beans, rather than choosing canned. They can be a cheap and quick-cook option that you can add as a side to any meal    Comments  He is looking to maintain is CBW. Eats several processed snack foods daily (usually in place of dinner), eats fried foods, does not monitor salt, and most meals consumed are ready-prepared/ frozen      Intervention Plan   Intervention  Prescribe, educate and counsel regarding individualized specific dietary modifications aiming towards targeted core components such as weight, hypertension, lipid  management, diabetes, heart failure and other comorbidities.;Nutrition handout(s) given to patient.   nutrition guidelines for COPD handout   Expected Outcomes  Short Term Goal: Understand basic principles of dietary content, such as calories, fat, sodium, cholesterol and nutrients.;Short Term Goal: A plan has been developed with personal nutrition goals set during dietitian appointment.;Long Term Goal: Adherence to prescribed nutrition plan.       Nutrition Assessments:   Nutrition Goals Re-Evaluation: Nutrition Goals Re-Evaluation    Row Name 10/22/17 1245 11/26/17 1228           Goals   Nutrition Goal  Start to pay attention to the sodium content in foods, particularly for pre-prepared meals and canned items. Choose foods with less than 30086mf sodium  Start to pay attention to the sodium content in foods, particularly for canned or pre-packaged items; Increase fluid intake- ideally swapping sodas for water; look for frozen vegetables as an alternative to canned as one way to reduce salt in your diet      Comment  Currently he does not pay attention the sodium content in foods. His diet is high in pre-packaged foods, canned items, frozen meals, fried foods  He has started to be mindful of sodium intake and reports wt loss of ~5# and improved breathing as a result. He is choosing low sodium canned options and now buys no-salt frozen  vegetables as well. Overall vegetable intake has incrased and he has swapped some sodas for water. He is not eating pre-prepared foods regularly      Expected Outcome  He will start to read food labels and become more aware of the salt in foods he consumes. Ideally, he will choose foods with less than 318m of sodium  He will continue to educate himself on a low sodium diet and choose food and beverage items which are lower in salt / that contain no salt. He will continue to decrease soda consumption and incrase water instead.        Personal Goal #2 Re-Evaluation    Personal Goal #2  Increase water consumption, start by swapping one soda for a glass of water per day  -        Personal Goal #3 Re-Evaluation   Personal Goal #3  Look in the frozen section for vegetables and beans, rather than choosing canned. They can be a cheap and quick- cook option that you can add as a side to any meal  -         Nutrition Goals Discharge (Final Nutrition Goals Re-Evaluation): Nutrition Goals Re-Evaluation - 11/26/17 1228      Goals   Nutrition Goal  Start to pay attention to the sodium content in foods, particularly for canned or pre-packaged items; Increase fluid intake- ideally swapping sodas for water; look for frozen vegetables as an alternative to canned as one way to reduce salt in your diet    Comment  He has started to be mindful of sodium intake and reports wt loss of ~5# and improved breathing as a result. He is choosing low sodium canned options and now buys no-salt frozen vegetables as well. Overall vegetable intake has incrased and he has swapped some sodas for water. He is not eating pre-prepared foods regularly    Expected Outcome  He will continue to educate himself on a low sodium diet and choose food and beverage items which are lower in salt / that contain no salt. He will continue to decrease soda consumption and incrase water instead.       Psychosocial: Target Goals: Acknowledge presence or absence of significant depression and/or stress, maximize coping skills, provide positive support system. Participant is able to verbalize types and ability to use techniques and skills needed for reducing stress and depression.   Initial Review & Psychosocial Screening: Initial Psych Review & Screening - 10/06/17 1421      Initial Review   Current issues with  Current Stress Concerns    Source of Stress Concerns  Chronic Illness    Comments  His breathing is his main concern      Family Dynamics   Good Support System?  Yes    Comments  His oldest  sister is good for support.      Barriers   Psychosocial barriers to participate in program  The patient should benefit from training in stress management and relaxation.      Screening Interventions   Interventions  Encouraged to exercise;To provide support and resources with identified psychosocial needs;Program counselor consult;Provide feedback about the scores to participant    Expected Outcomes  Short Term goal: Utilizing psychosocial counselor, staff and physician to assist with identification of specific Stressors or current issues interfering with healing process. Setting desired goal for each stressor or current issue identified.;Long Term Goal: Stressors or current issues are controlled or eliminated.;Short Term goal: Identification and review with participant of  any Quality of Life or Depression concerns found by scoring the questionnaire.;Long Term goal: The participant improves quality of Life and PHQ9 Scores as seen by post scores and/or verbalization of changes       Quality of Life Scores:  Scores of 19 and below usually indicate a poorer quality of life in these areas.  A difference of  2-3 points is a clinically meaningful difference.  A difference of 2-3 points in the total score of the Quality of Life Index has been associated with significant improvement in overall quality of life, self-image, physical symptoms, and general health in studies assessing change in quality of life.  PHQ-9: Recent Review Flowsheet Data    Depression screen [facility] 2/9 10/06/2017   Decreased Interest 3   Down, Depressed, Hopeless 3   PHQ - 2 Score 6   Altered sleeping 0   Tired, decreased energy 2   Change in appetite 0   Feeling bad or failure about yourself  0   Trouble concentrating 0   Moving slowly or fidgety/restless 0   Suicidal thoughts 0   PHQ-9 Score 8   Difficult doing work/chores Somewhat difficult     Interpretation of Total Score  Total Score Depression Severity:  1-4 =  Minimal depression, 5-9 = Mild depression, 10-14 = Moderate depression, 15-19 = Moderately severe depression, 20-27 = Severe depression   Psychosocial Evaluation and Intervention: Psychosocial Evaluation - 10/22/17 1238      Psychosocial Evaluation & Interventions   Interventions  Stress management education;Encouraged to exercise with the program and follow exercise prescription    Comments  Counselor met with Mr. Rise Truss) today for initial psychosocial evaluation.  He is a 47 year old who has been diagnosed with COPD.  He has a limited support system with a sister in [place] and he lives with a friend.   Magdaleno reports sleeping well and has a good appetite.  He denies a history of depression or anxiety or any current symptoms and is typically in a positive mood.  Haddon states he was a little depressed initially when he was put on oxygen full-time and lost interest in going places or doing things he used to enjoy.  But, he has since grown to accept this and is working towards getting off the oxygen by coming to this class and learning how to conserve energy; pace himself and used pursed lip breathing.  He reports progress already seen in a few short weeks with being able to park his truck and walk into the class without use of valet parking or being put in a wheel chair.  Counselor commended him on his progress made and his commitment to his goals.  Staff will follow with him.     Expected Outcomes  Short:  Jaythen will continue to exercise consistently and walk in and out to his truck without assistance.   Long:  Deandre will develop strategies to breathe without full time oxygen; including consistent exercise.      Continue Psychosocial Services   Follow up required by staff       Psychosocial Re-Evaluation: Psychosocial Re-Evaluation    [place] Name 11/07/17 1149 12/17/17 1215           Psychosocial Re-Evaluation   Current issues with  Current Stress Concerns  Current Stress Concerns       Comments  Patient feels like his breathing has improved slightly. He feels like he can catch his breath faster which makes him more relaxed. He  is focused to get his breathing better and has a good attitude.  His truck has been giving him problems and was having trouble getting around.. He states everything is going good. He wants to get off of his oxygen.      Expected Outcomes  Short attened [facility] to decrease stress. Long: maintain an exercise program to keep stress at a minimum  Short: keep decreasing oxygen to increase mood. Long: keep a positive attitude and finish [facility].      Interventions  Encouraged to attend Pulmonary Rehabilitation for the exercise  -      Continue Psychosocial Services   Follow up required by staff  Follow up required by staff         Psychosocial Discharge (Final Psychosocial Re-Evaluation): Psychosocial Re-Evaluation - 12/17/17 1215      Psychosocial Re-Evaluation   Current issues with  Current Stress Concerns    Comments  His truck has been giving him problems and was having trouble getting around.. He states everything is going good. He wants to get off of his oxygen.    Expected Outcomes  Short: keep decreasing oxygen to increase mood. Long: keep a positive attitude and finish [facility].    Continue Psychosocial Services   Follow up required by staff       Education: Education Goals: Education classes will be provided on a weekly basis, covering required topics. Participant will state understanding/return demonstration of topics presented.  Learning Barriers/Preferences: Learning Barriers/Preferences - 10/06/17 1426      Learning Barriers/Preferences   Learning Barriers  None    Learning Preferences  None       Education Topics:  Initial Evaluation Education: - Verbal, written and demonstration of respiratory meds, oximetry and breathing techniques. Instruction on use of nebulizers and MDIs and importance of monitoring MDI activations.    Pulmonary Rehab from 12/17/2017 in [facility] Cardiac and Pulmonary Rehab  Date  10/06/17  Educator  [facility]  Instruction Review Code  1- Verbalizes Understanding      General Nutrition Guidelines/Fats and Fiber: -Group instruction provided by verbal, written material, models and posters to present the general guidelines for heart healthy nutrition. Gives an explanation and review of dietary fats and fiber.   Pulmonary Rehab from 12/17/2017 in [facility] Cardiac and Pulmonary Rehab  Date  11/12/17  Educator  [facility]  Instruction Review Code  1- Verbalizes Understanding      Controlling Sodium/Reading Food Labels: -Group verbal and written material supporting the discussion of sodium use in heart healthy nutrition. Review and explanation with models, verbal and written materials for utilization of the food label.   Pulmonary Rehab from 12/17/2017 in [facility] Cardiac and Pulmonary Rehab  Date  11/19/17  Educator  [facility]  Instruction Review Code  1- Verbalizes Understanding      Exercise Physiology & General Exercise Guidelines: - Group verbal and written instruction with models to review the exercise physiology of the cardiovascular system and associated critical values. Provides general exercise guidelines with specific guidelines to those with heart or lung disease.    Pulmonary Rehab from 12/17/2017 in [facility] Cardiac and Pulmonary Rehab  Date  11/26/17  Educator  [facility]  Instruction Review Code  1- Verbalizes Understanding      Aerobic Exercise & Resistance Training: - Gives group verbal and written instruction on the various components of exercise. Focuses on aerobic and resistive training programs and the benefits of this training and how to safely progress through these programs.   Flexibility, Balance, Mind/Body  Relaxation: Provides group verbal/written instruction on the benefits of flexibility and balance training, including mind/body exercise modes such as yoga, pilates and tai chi.  Demonstration and  skill practice provided.   Stress and Anxiety: - Provides group verbal and written instruction about the health risks of elevated stress and causes of high stress.  Discuss the correlation between heart/lung disease and anxiety and treatment options. Review healthy ways to manage with stress and anxiety.   Depression: - Provides group verbal and written instruction on the correlation between heart/lung disease and depressed mood, treatment options, and the stigmas associated with seeking treatment.   Pulmonary Rehab from 12/17/2017 in [facility] Cardiac and Pulmonary Rehab  Date  12/17/17  Educator  [facility]  Instruction Review Code  1- Verbalizes Understanding      Exercise & Equipment Safety: - Individual verbal instruction and demonstration of equipment use and safety with use of the equipment.   Pulmonary Rehab from 12/17/2017 in [facility] Cardiac and Pulmonary Rehab  Date  10/06/17  Educator  [facility]  Instruction Review Code  1- Verbalizes Understanding      Infection Prevention: - Provides verbal and written material to individual with discussion of infection control including proper hand washing and proper equipment cleaning during exercise session.   Pulmonary Rehab from 12/17/2017 in [facility] Cardiac and Pulmonary Rehab  Date  10/06/17  Educator  [facility]  Instruction Review Code  1- Verbalizes Understanding      Falls Prevention: - Provides verbal and written material to individual with discussion of falls prevention and safety.   Pulmonary Rehab from 12/17/2017 in [facility] Cardiac and Pulmonary Rehab  Date  10/06/17  Educator  [facility]  Instruction Review Code  1- Verbalizes Understanding      Diabetes: - Individual verbal and written instruction to review signs/symptoms of diabetes, desired ranges of glucose level fasting, after meals and with exercise. Advice that pre and post exercise glucose checks will be done for 3 sessions at entry of program.   Chronic Lung Diseases: - Group verbal and  written instruction to review updates, respiratory medications, advancements in procedures and treatments. Discuss use of supplemental oxygen including available portable oxygen systems, continuous and intermittent flow rates, concentrators, personal use and safety guidelines. Review proper use of inhaler and spacers. Provide informative websites for self-education.    Pulmonary Rehab from 12/17/2017 in [facility] Cardiac and Pulmonary Rehab  Date  11/14/17  Educator  [facility]  Instruction Review Code  -- Shona Needles to walk during class, heard most from his physician]      Energy Conservation: - Provide group verbal and written instruction for methods to conserve energy, plan and organize activities. Instruct on pacing techniques, use of adaptive equipment and posture/positioning to relieve shortness of breath.   Pulmonary Rehab from 12/17/2017 in [facility] Cardiac and Pulmonary Rehab  Date  10/22/17  Educator  [facility]  Instruction Review Code  1- Verbalizes Understanding      Triggers and Exacerbations: - Group verbal and written instruction to review types of environmental triggers and ways to prevent exacerbations. Discuss weather changes, air quality and the benefits of nasal washing. Review warning signs and symptoms to help prevent infections. Discuss techniques for effective airway clearance, coughing, and vibrations.   Pulmonary Rehab from 12/17/2017 in [facility] Cardiac and Pulmonary Rehab  Date  12/03/17  Educator  [facility]  Instruction Review Code  1- Verbalizes Understanding      AED/CPR: - Group verbal and written instruction with the use of models to demonstrate the  basic use of the AED with the basic ABC's of resuscitation.   Pulmonary Rehab from 12/17/2017 in [facility] Cardiac and Pulmonary Rehab  Date  10/17/17  Educator  [facility]  Instruction Review Code  1- Actuary and Physiology of the Lungs: - Group verbal and written instruction with the use of models to provide basic lung  anatomy and physiology related to function, structure and complications of lung disease.   Anatomy & Physiology of the Heart: - Group verbal and written instruction and models provide basic cardiac anatomy and physiology, with the coronary electrical and arterial systems. Review of Valvular disease and Heart Failure   Cardiac Medications: - Group verbal and written instruction to review commonly prescribed medications for heart disease. Reviews the medication, class of the drug, and side effects.   Pulmonary Rehab from 12/17/2017 in [facility] Cardiac and Pulmonary Rehab  Date  10/15/17  Educator  [facility]  Instruction Review Code  1- Verbalizes Understanding      Know Your Numbers and Risk Factors: -Group verbal and written instruction about important numbers in your health.  Discussion of what are risk factors and how they play a role in the disease process.  Review of Cholesterol, Blood Pressure, Diabetes, and BMI and the role they play in your overall health.   Pulmonary Rehab from 12/17/2017 in [facility] Cardiac and Pulmonary Rehab  Date  11/28/17  Educator  [facility]  Instruction Review Code  1- Verbalizes Understanding      Sleep Hygiene: -Provides group verbal and written instruction about how sleep can affect your health.  Define sleep hygiene, discuss sleep cycles and impact of sleep habits. Review good sleep hygiene tips.    Other: -Provides group and verbal instruction on various topics (see comments)    Knowledge Questionnaire Score: Knowledge Questionnaire Score - 10/06/17 1405      Knowledge Questionnaire Score   Pre Score  12/18   Reviewed with patient       Core Components/Risk Factors/Patient Goals at Admission: Personal Goals and Risk Factors at Admission - 10/06/17 1431      Core Components/Risk Factors/Patient Goals on Admission    Weight Management  Yes;Weight Maintenance    Intervention  Weight Management: Develop a combined nutrition and exercise program designed to  reach desired caloric intake, while maintaining appropriate intake of nutrient and fiber, sodium and fats, and appropriate energy expenditure required for the weight goal.;Weight Management: Provide education and appropriate resources to help participant work on and attain dietary goals.;Weight Management/Obesity: Establish reasonable short term and long term weight goals.    Admit Weight  155 lb 14.4 oz (70.7 kg)    Goal Weight: Short Term  155 lb (70.3 kg)    Goal Weight: Long Term  155 lb (70.3 kg)    Expected Outcomes  Short Term: Continue to assess and modify interventions until short term weight is achieved;Long Term: Adherence to nutrition and physical activity/exercise program aimed toward attainment of established weight goal;Weight Maintenance: Understanding of the daily nutrition guidelines, which includes 25-35% calories from fat, 7% or less cal from saturated fats, less than 281m cholesterol, less than 1.5gm of sodium, & 5 or more servings of fruits and vegetables daily;Understanding recommendations for meals to include 15-35% energy as protein, 25-35% energy from fat, 35-60% energy from carbohydrates, less than 2040mof dietary cholesterol, 20-35 gm of total fiber daily;Understanding of distribution of calorie intake throughout the day with the consumption of 4-5 meals/snacks  Improve shortness of breath with ADL's  Yes    Intervention  Provide education, individualized exercise plan and daily activity instruction to help decrease symptoms of SOB with activities of daily living.    Expected Outcomes  Long Term: Be able to perform more ADLs without symptoms or delay the onset of symptoms;Short Term: Improve cardiorespiratory fitness to achieve a reduction of symptoms when performing ADLs       Core Components/Risk Factors/Patient Goals Review:  Goals and Risk Factor Review    Row Name 11/07/17 1153 12/17/17 1219           Core Components/Risk Factors/Patient Goals Review   Personal  Goals Review  Weight Management/Obesity;Improve shortness of breath with ADL's  Weight Management/Obesity;Improve shortness of breath with ADL's      Review  Patient has been doing well with his weight. He gets short of breath when he eats. When he lays down and gets up he gets short of breath. Grocery shopping is hard for him if he takes it slow. He  has his oxygen in his truck and takes his oxygen with him at all times.   His breathing has been better. Mathhew has more of an understanding about his illness. Patient is using 1 liter of oxygen while exercising and hopes to get off of oxygen.      Expected Outcomes  Short: attened [facility] to improve ADLs. Long: Maintain ADLs independently while checking oxygen.  Short: decrease oxygen to room air. Long: stay off oxygen while maintaing ADL.         Core Components/Risk Factors/Patient Goals at Discharge (Final Review):  Goals and Risk Factor Review - 12/17/17 1219      Core Components/Risk Factors/Patient Goals Review   Personal Goals Review  Weight Management/Obesity;Improve shortness of breath with ADL's    Review  His breathing has been better. Yahir has more of an understanding about his illness. Patient is using 1 liter of oxygen while exercising and hopes to get off of oxygen.    Expected Outcomes  Short: decrease oxygen to room air. Long: stay off oxygen while maintaing ADL.       ITP Comments: ITP Comments    Row Name 10/06/17 1403 10/27/17 0835 11/24/17 0837 12/22/17 0855     ITP Comments  Medical Evaluation completed. Chart sent for review and changes to Dr. Caryl Comes for Dr. Emily Filbert Director of [place]. Diagnosis can be found in [facility] encounter 09/02/16  30 day review completed. ITP sent to Dr. Emily Filbert Director of [place]. Continue with ITP unless changes are made by physician  30 day review completed. ITP sent to Dr. Emily Filbert Director of [place]. Continue with ITP unless changes are made by physician  30 day review completed.  ITP sent to Dr. Emily Filbert Director of [place]. Continue with ITP unless changes are made by physician.       Comments: 30 day review

## 2017-12-24 ENCOUNTER — Encounter: Payer: Medicaid Other | Admitting: *Deleted

## 2017-12-24 DIAGNOSIS — J449 Chronic obstructive pulmonary disease, unspecified: Secondary | ICD-10-CM

## 2017-12-24 NOTE — Progress Notes (Signed)
Daily Session Note  Patient Details  Name: Chris Case MRN: [id number] Date of Birth: Apr 07, 1970 Referring Provider:     Pulmonary Rehab from 10/06/2017 in [facility] Cardiac and Pulmonary Rehab  Referring Provider  Ancil Linsey MD      Encounter Date: 12/24/2017  Check In: Session Check In - 12/24/17 1134      Check-In   Supervising physician immediately available to respond to emergencies  [facility] immediately available ER MD    Physician(s)  Drs. Veda Canning    Location  [facility]-Cardiac & Pulmonary Rehab    Staff Present  Renita Papa, RN BSN;Samanthamarie Ezzell Luan [place], [place], RCEP, CCRP, Exercise Physiologist;Joseph Tessie Fass RCP,RRT,BSRT    Medication changes reported      No    Fall or balance concerns reported     No    Warm-up and Cool-down  Performed as group-led instruction    Resistance Training Performed  Yes    VAD Patient?  No    PAD/SET Patient?  No      Pain Assessment   Currently in Pain?  No/denies          Social History   Tobacco Use  Smoking Status Former Smoker  . Packs/day: 3.00  . Years: 20.00  . Pack years: 60.00  . Types: Cigarettes  . Last attempt to quit: 04/11/2016  . Years since quitting: 1.7  Smokeless Tobacco Never Used    Goals Met:  Proper associated with RPD/PD & O2 Sat Independence with exercise equipment Using PLB without cueing & demonstrates good technique Exercise tolerated well No report of cardiac concerns or symptoms Strength training completed today  Goals Unmet:  Not Applicable  Comments: Pt able to follow exercise prescription today without complaint.  Will continue to monitor for progression.    Dr. Emily Filbert is Medical Director for [place] and [facility].

## 2017-12-26 ENCOUNTER — Encounter: Payer: Medicaid Other | Admitting: *Deleted

## 2017-12-26 DIAGNOSIS — J449 Chronic obstructive pulmonary disease, unspecified: Secondary | ICD-10-CM | POA: Diagnosis not present

## 2017-12-26 NOTE — Progress Notes (Signed)
Daily Session Note  Patient Details  Name: Devlin Brink MRN: [id number] Date of Birth: December 23, 1970 Referring Provider:     Pulmonary Rehab from 10/06/2017 in [facility] Cardiac and Pulmonary Rehab  Referring Provider  Ancil Linsey MD      Encounter Date: 12/26/2017  Check In: Session Check In - 12/26/17 1120      Check-In   Supervising physician immediately available to respond to emergencies  [facility] immediately available ER MD    Physician(s)  Drs. Siadecki and Optician, dispensing & Pulmonary Rehab    Staff Present  Renita Papa, RN BSN;Joseph Hood RCP,RRT,BSRT;Brittanee Ghazarian [place], [place], [place], [place], Exercise Physiologist    Medication changes reported      No    Fall or balance concerns reported     No    Warm-up and Cool-down  Performed as group-led instruction    Resistance Training Performed  Yes    VAD Patient?  No    PAD/SET Patient?  No      Pain Assessment   Currently in Pain?  No/denies          Social History   Tobacco Use  Smoking Status Former Smoker  . Packs/day: 3.00  . Years: 20.00  . Pack years: 60.00  . Types: Cigarettes  . Last attempt to quit: 04/11/2016  . Years since quitting: 1.7  Smokeless Tobacco Never Used    Goals Met:  Proper associated with RPD/PD & O2 Sat Independence with exercise equipment Using PLB without cueing & demonstrates good technique Exercise tolerated well No report of cardiac concerns or symptoms Strength training completed today  Goals Unmet:  Not Applicable  Comments: Pt able to follow exercise prescription today without complaint.  Will continue to monitor for progression.    Dr. Emily Filbert is Medical Director for [place] and [facility].

## 2017-12-31 DIAGNOSIS — J449 Chronic obstructive pulmonary disease, unspecified: Secondary | ICD-10-CM | POA: Diagnosis not present

## 2017-12-31 NOTE — Progress Notes (Signed)
Daily Session Note  Patient Details  Name: Chris Case MRN: [id number] Date of Birth: 10-26-1970 Referring Provider:     Pulmonary Rehab from 10/06/2017 in [facility] Cardiac and Pulmonary Rehab  Referring Provider  Ancil Linsey MD      Encounter Date: 12/31/2017  Check In: Session Check In - 12/31/17 1143      Check-In   Supervising physician immediately available to respond to emergencies  [facility] immediately available ER MD    Physician(s)  Dr. Reita Cliche and Quentin Cornwall    Location  [facility]-Cardiac & Pulmonary Rehab    Staff Present  Renita Papa, RN BSN;Jessica Luan [place], MA, RCEP, CCRP, Exercise Physiologist;Madeline Bebout Tessie Fass RCP,RRT,BSRT    Medication changes reported      No    Fall or balance concerns reported     No    Warm-up and Cool-down  Performed as group-led instruction    Resistance Training Performed  Yes    VAD Patient?  No    PAD/SET Patient?  No      Pain Assessment   Currently in Pain?  No/denies          Social History   Tobacco Use  Smoking Status Former Smoker  . Packs/day: 3.00  . Years: 20.00  . Pack years: 60.00  . Types: Cigarettes  . Last attempt to quit: 04/11/2016  . Years since quitting: 1.7  Smokeless Tobacco Never Used    Goals Met:  Independence with exercise equipment Exercise tolerated well No report of cardiac concerns or symptoms Strength training completed today  Goals Unmet:  Not Applicable  Comments: Pt able to follow exercise prescription today without complaint.  Will continue to monitor for progression.   Dr. Emily Filbert is Medical Director for [place] and [facility].

## 2018-01-02 ENCOUNTER — Encounter: Payer: Medicaid Other | Attending: Specialist

## 2018-01-02 DIAGNOSIS — J449 Chronic obstructive pulmonary disease, unspecified: Secondary | ICD-10-CM | POA: Insufficient documentation

## 2018-01-12 DIAGNOSIS — J449 Chronic obstructive pulmonary disease, unspecified: Secondary | ICD-10-CM

## 2018-01-12 NOTE — Progress Notes (Signed)
Daily Session Note  Patient Details  Name: Chris Case MRN: [id number] Date of Birth: Jun 19, 1970 Referring Provider:     Pulmonary Rehab from 10/06/2017 in [facility] Cardiac and Pulmonary Rehab  Referring Provider  Ancil Linsey MD      Encounter Date: 01/12/2018  Check In: Session Check In - 01/12/18 1207      Check-In   Supervising physician immediately available to respond to emergencies  [facility] immediately available ER MD    Physician(s)   Dr. Jacqualine Code and [facility]    Location  [facility]-Cardiac & Pulmonary Rehab    Staff Present  Justin Mend Jaci Carrel, BS, ACSM CEP, Exercise Physiologist;Laureen Owens Shark, BS, RRT, Respiratory Therapist    Medication changes reported      No    Fall or balance concerns reported     No    Warm-up and Cool-down  Performed as group-led instruction    Resistance Training Performed  Yes    VAD Patient?  No    PAD/SET Patient?  No      Pain Assessment   Currently in Pain?  No/denies          Social History   Tobacco Use  Smoking Status Former Smoker  . Packs/day: 3.00  . Years: 20.00  . Pack years: 60.00  . Types: Cigarettes  . Last attempt to quit: 04/11/2016  . Years since quitting: 1.7  Smokeless Tobacco Never Used    Goals Met:  Independence with exercise equipment Exercise tolerated well No report of cardiac concerns or symptoms Strength training completed today  Goals Unmet:  Not Applicable  Comments: Pt able to follow exercise prescription today without complaint.  Will continue to monitor for progression.    Dr. Emily Filbert is Medical Director for [place] and [facility].

## 2018-01-14 ENCOUNTER — Encounter: Payer: Medicaid Other | Admitting: *Deleted

## 2018-01-14 DIAGNOSIS — J449 Chronic obstructive pulmonary disease, unspecified: Secondary | ICD-10-CM | POA: Diagnosis not present

## 2018-01-14 NOTE — Progress Notes (Signed)
Daily Session Note  Patient Details  Name: Furkan Keenum MRN: [id number] Date of Birth: [birth year] Referring Provider:     Pulmonary Rehab from 10/06/2017 in [facility] Cardiac and Pulmonary Rehab  Referring Provider  Ancil Linsey MD      Encounter Date: 01/14/2018  Check In: Session Check In - 01/14/18 1142      Check-In   Supervising physician immediately available to respond to emergencies  [facility] immediately available ER MD    Physician(s)  Dr. Reita Cliche and Darl Householder    Location  [facility]-Cardiac & Pulmonary Rehab    Staff Present  Renita Papa, RN BSN;Joseph Darrin [place], [place], RCEP, CCRP, Exercise Physiologist    Medication changes reported      No    Fall or balance concerns reported     No    Warm-up and Cool-down  Performed as group-led instruction    Resistance Training Performed  Yes    VAD Patient?  No    PAD/SET Patient?  No      Pain Assessment   Currently in Pain?  No/denies          Social History   Tobacco Use  Smoking Status Former Smoker  . Packs/day: 3.00  . Years: 20.00  . Pack years: 60.00  . Types: Cigarettes  . Last attempt to quit: 04/11/2016  . Years since quitting: 1.7  Smokeless Tobacco Never Used    Goals Met:  Proper associated with RPD/PD & O2 Sat Independence with exercise equipment Using PLB without cueing & demonstrates good technique Exercise tolerated well No report of cardiac concerns or symptoms Strength training completed today  Goals Unmet:  Not Applicable  Comments: Pt able to follow exercise prescription today without complaint.  Will continue to monitor for progression.    Dr. Emily Filbert is Medical Director for [place] and [facility].

## 2018-01-16 ENCOUNTER — Encounter: Payer: Medicaid Other | Admitting: *Deleted

## 2018-01-16 DIAGNOSIS — J449 Chronic obstructive pulmonary disease, unspecified: Secondary | ICD-10-CM | POA: Diagnosis not present

## 2018-01-16 NOTE — Progress Notes (Signed)
Daily Session Note  Patient Details  Name: Chris Case MRN: [id number] Date of Birth: Jan 27, 1971 Referring Provider:     Pulmonary Rehab from 10/06/2017 in [facility] Cardiac and Pulmonary Rehab  Referring Provider  Ancil Linsey MD      Encounter Date: 01/16/2018  Check In: Session Check In - 01/16/18 1130      Check-In   Supervising physician immediately available to respond to emergencies  [facility] immediately available ER MD    Physician(s)  Drs. Lord and Psychologist, forensic & Pulmonary Rehab    Staff Present  Renita Papa, RN BSN;Gwyneth Fernandez Luan [place], [place], RCEP, CCRP, Exercise Physiologist;Joseph Tessie Fass RCP,RRT,BSRT    Medication changes reported      No    Fall or balance concerns reported     No    Warm-up and Cool-down  Performed as group-led instruction    Resistance Training Performed  Yes    VAD Patient?  No    PAD/SET Patient?  No      Pain Assessment   Currently in Pain?  No/denies          Social History   Tobacco Use  Smoking Status Former Smoker  . Packs/day: 3.00  . Years: 20.00  . Pack years: 60.00  . Types: Cigarettes  . Last attempt to quit: 04/11/2016  . Years since quitting: 1.7  Smokeless Tobacco Never Used    Goals Met:  Proper associated with RPD/PD & O2 Sat Independence with exercise equipment Using PLB without cueing & demonstrates good technique Exercise tolerated well No report of cardiac concerns or symptoms Strength training completed today  Goals Unmet:  Not Applicable  Comments: Pt able to follow exercise prescription today without complaint.  Will continue to monitor for progression.    Dr. Emily Filbert is Medical Director for [place] and [facility].

## 2018-01-19 DIAGNOSIS — J449 Chronic obstructive pulmonary disease, unspecified: Secondary | ICD-10-CM

## 2018-01-19 NOTE — Progress Notes (Signed)
Daily Session Note  Patient Details  Name: Chris Case MRN: [id number] Date of Birth: 1970-08-29 Referring Provider:     Pulmonary Rehab from 10/06/2017 in [facility] Cardiac and Pulmonary Rehab  Referring Provider  Ancil Linsey MD      Encounter Date: 01/19/2018  Check In:      Social History   Tobacco Use  Smoking Status Former Smoker  . Packs/day: 3.00  . Years: 20.00  . Pack years: 60.00  . Types: Cigarettes  . Last attempt to quit: 04/11/2016  . Years since quitting: 1.7  Smokeless Tobacco Never Used    Goals Met:  Proper associated with RPD/PD & O2 Sat Independence with exercise equipment Exercise tolerated well Strength training completed today  Goals Unmet:  Not Applicable  Comments: Pt able to follow exercise prescription today without complaint.  Will continue to monitor for progression.    Dr. Emily Filbert is Medical Director for [place] and [facility].

## 2018-01-19 NOTE — Progress Notes (Signed)
Pulmonary Individual Treatment Plan  Patient Details  Name: Chris Case MRN: [id number] Date of Birth: May 07, 1970 Referring Provider:     Pulmonary Rehab from 10/06/2017 in [facility] Cardiac [facility] Pulmonary Rehab  Referring Provider  Ancil Linsey MD      Initial Encounter Date:    Pulmonary Rehab from 10/06/2017 in [facility] Cardiac and Pulmonary Rehab  Date  10/06/17      Visit Diagnosis: Chronic obstructive pulmonary disease, unspecified COPD type ([place])  [name] Home Medications on Admission:  Current Outpatient Medications:  .  albuterol (PROVENTIL) (2.5 MG/3ML) 0.083% nebulizer solution, Inhale 3 mLs into the lungs every 4 (four) hours as needed for wheezing or shortness of breath., Disp: 75 mL, Rfl: 12 .  aspirin EC 81 MG EC tablet, Take 1 tablet (81 mg total) by mouth daily., Disp: 30 tablet, Rfl: 6 .  furosemide (LASIX) 20 MG tablet, Take 20 mg by mouth daily., Disp: , Rfl: 0 .  mometasone-formoterol (DULERA) 200-5 MCG/ACT AERO, Inhale 2 puffs into the lungs 2 (two) times daily., Disp: 1 Inhaler, Rfl: 5 .  OXYGEN, Inhale 2 L into the lungs., Disp: , Rfl:   Past Medical History: Past Medical History:  Diagnosis Date  . COPD (chronic obstructive pulmonary disease) ([place])   . Emphysema lung ([place])   . 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 Packs/day: 3.00  . Years: 20.00  . Pack years: 60.00  . Types: Cigarettes  . Last attempt to quit: 04/11/2016  . Years since quitting: 1.7  Smokeless Tobacco Never Used    Labs: Recent Review Flowsheet Data    There is no flowsheet data to display.       Pulmonary Assessment Scores: Pulmonary Assessment Scores    Row Name 10/06/17 1404         ADL UCSD   ADL Phase  Entry     SOB Score total  47     Rest  4     Walk  3     Stairs  2     Bath  2     Dress  2     Shop  2       CAT Score   CAT Score  22       mMRC Score    mMRC Score  2        Pulmonary Function Assessment: Pulmonary Function Assessment - 10/06/17 1408      Initial Spirometry Results   FVC%  30 %   Test done on 06/19/16 Care Everywhere   FEV1%  17 %    FEV1/FVC Ratio  46      Post Bronchodilator Spirometry Results   FVC%  35 %   Test done on 06/19/16 Care Everywhere   FEV1%  20 %    FEV1/FVC Ratio  46      Breath   Bilateral Breath Sounds  Decreased    Shortness of Breath  Yes;Panic with Shortness of Breath;Limiting activity       Exercise Target Goals: Exercise Program Goal: Individual exercise prescription set using results from initial 6 min walk test and THRR while considering  patient's activity barriers and safety.   Exercise Prescription Goal: Initial exercise prescription builds to 30-45 minutes a day of aerobic activity, 2-3 days per week.  Home exercise guidelines will be given to patient during program as  part of exercise prescription that the participant will acknowledge.  Activity Barriers & Risk Stratification: Activity Barriers & Cardiac Risk Stratification - 10/06/17 1422      Activity Barriers & Cardiac Risk Stratification   Activity Barriers  Deconditioning;Shortness of Breath;Muscular Weakness       6 Minute Walk: 6 Minute Walk    Row Name 10/06/17 1420         6 Minute Walk   Phase  Initial     Distance  920 feet     Walk Time  6 minutes     # of Rest Breaks  0     MPH  1.74     METS  3.75     RPE  11     Perceived Dyspnea   2     VO2 Peak  13.14     Symptoms  Yes (comment)     Comments  SOB     Resting HR  78 bpm     Resting BP  130/60     Resting Oxygen Saturation   96 %     Exercise Oxygen Saturation  during 6 min walk  85 %     Max Ex. HR  102 bpm     Max Ex. BP  138/74     2 Minute Post BP  132/74       Interval HR   1 Minute HR  93     2 Minute HR  94     3 Minute HR  96     4 Minute HR  100     5 Minute HR  101     6 Minute HR  102     2 Minute Post HR  87     Interval  Heart Rate?  Yes       Interval Oxygen   Interval Oxygen?  Yes     Baseline Oxygen Saturation %  96 %     1 Minute Oxygen Saturation %  92 %     1 Minute Liters of Oxygen  3 L     2 Minute Oxygen Saturation %  92 % 2:33 83%     2 Minute Liters of Oxygen  3 L     3 Minute Oxygen Saturation %  88 %     3 Minute Liters of Oxygen  3 L     4 Minute Oxygen Saturation %  86 %     4 Minute Liters of Oxygen  3 L     5 Minute Oxygen Saturation %  86 %     5 Minute Liters of Oxygen  3 L     6 Minute Oxygen Saturation %  86 % 6:05 85%     6 Minute Liters of Oxygen  3 L     2 Minute Post Oxygen Saturation %  94 %     2 Minute Post Liters of Oxygen  3 L       Oxygen Initial Assessment: Oxygen Initial Assessment - 10/06/17 1430      Home Oxygen   Home Oxygen Device  Home Concentrator;E-Tanks    Sleep Oxygen Prescription  Continuous    Liters per minute  2    Home Exercise Oxygen Prescription  Continuous    Liters per minute  3    Home at Rest Exercise Oxygen Prescription  Continuous    Liters per minute  3    Compliance with Home Oxygen Use  Yes      Initial 6 min Walk   Oxygen Used  Continuous    Liters per minute  3      Program Oxygen Prescription   Program Oxygen Prescription  Continuous    Liters per minute  3      Intervention   Short Term Goals  To learn and exhibit compliance with exercise, home and travel O2 prescription;To learn and understand importance of monitoring SPO2 with pulse oximeter and demonstrate accurate use of the pulse oximeter.;To learn and understand importance of maintaining oxygen saturations>88%;To learn and demonstrate proper pursed lip breathing techniques or other breathing techniques.;To learn and demonstrate proper use of respiratory medications    Long  Term Goals  Exhibits compliance with exercise, home and travel O2 prescription;Verbalizes importance of monitoring SPO2 with pulse oximeter and return demonstration;Maintenance of O2  saturations>88%;Exhibits proper breathing techniques, such as pursed lip breathing or other method taught during program session;Compliance with respiratory medication;Demonstrates proper use of MDI's       Oxygen Re-Evaluation: Oxygen Re-Evaluation    Row Name 10/13/17 1605 11/07/17 1143 12/03/17 1538 12/31/17 1350       Program Oxygen Prescription   Program Oxygen Prescription  -  Continuous  Continuous  Continuous    Liters per minute  -  _0 Comments  -  -  patient has been in the high 90's while exercising  -      Home Oxygen   Home Oxygen Device  -  Home Concentrator;E-Tanks  Home Concentrator;E-Tanks  Home Concentrator;E-Tanks    Sleep Oxygen Prescription  -  Continuous  Continuous  Continuous    Liters per minute  -  _1 Home Exercise Oxygen Prescription  -  Continuous  Continuous  Continuous    Liters per minute  -  _2 Home at Rest Exercise Oxygen Prescription  -  Continuous  Continuous  Continuous    Liters per minute  -  _3 Compliance with Home Oxygen Use  -  Yes  Yes  Yes      Goals/Expected Outcomes   Short Term Goals  To learn and understand importance of monitoring SPO2 with pulse oximeter and demonstrate accurate use of the pulse oximeter.;To learn and understand importance of maintaining oxygen saturations>88%;To learn and demonstrate proper pursed lip breathing techniques or other breathing techniques.  To learn and understand importance of monitoring SPO2 with pulse oximeter and demonstrate accurate use of the pulse oximeter.;To learn and understand importance of maintaining oxygen saturations>88%;To learn and demonstrate proper pursed lip breathing techniques or other breathing techniques.;To learn and demonstrate proper use of respiratory medications;To learn and exhibit compliance with exercise, home and travel O2 prescription  To learn and understand importance of monitoring SPO2 with pulse oximeter and demonstrate accurate use of the  pulse oximeter.;To learn and understand importance of maintaining oxygen saturations>88%;To learn and demonstrate proper pursed lip breathing techniques or other breathing techniques.;To learn and demonstrate proper use of respiratory medications;To learn and exhibit compliance with exercise, home and travel O2 prescription  To learn and understand importance of monitoring SPO2 with pulse oximeter and demonstrate accurate use of the pulse oximeter.;To learn and understand importance of maintaining oxygen saturations>88%;To learn and demonstrate proper pursed lip breathing techniques or other breathing techniques.;To learn and demonstrate proper use of respiratory medications;To learn and exhibit compliance with  exercise, home and travel O2 prescription    Long  Term Goals  Verbalizes importance of monitoring SPO2 with pulse oximeter and return demonstration;Maintenance of O2 saturations>88%;Exhibits proper breathing techniques, such as pursed lip breathing or other method taught during program session  Verbalizes importance of monitoring SPO2 with pulse oximeter and return demonstration;Maintenance of O2 saturations>88%;Exhibits proper breathing techniques, such as pursed lip breathing or other method taught during program session;Compliance with respiratory medication;Demonstrates proper use of MDI's;Exhibits compliance with exercise, home and travel O2 prescription  Verbalizes importance of monitoring SPO2 with pulse oximeter and return demonstration;Maintenance of O2 saturations>88%;Exhibits proper breathing techniques, such as pursed lip breathing or other method taught during program session;Compliance with respiratory medication;Demonstrates proper use of MDI's;Exhibits compliance with exercise, home and travel O2 prescription  Verbalizes importance of monitoring SPO2 with pulse oximeter and return demonstration;Maintenance of O2 saturations>88%;Exhibits proper breathing techniques, such as pursed lip  breathing or other method taught during program session;Compliance with respiratory medication;Demonstrates proper use of MDI's;Exhibits compliance with exercise, home and travel O2 prescription    Comments  Reviewed PLB technique with pt.  Talked about how it work and it's important to maintaining his exercise saturations.    Patient is taking his Albuterol nebulizer every 4 hours and as needed. He also takes advair and spiriva. Informed patient to take his medications in the proper order to better benifit his breathing. He should take is albuterol nebulizer or albuterol MDI before he takes his other inhalers.  Patient has been doing well in the class and has maintain oxygen saturations in the high 90's while exercising on 2 liters. Patient wants to come off oxygen if he can. We will continue to titrate oxygen if oxygen saturations can be maintained above 88 pecent while exercising.  Practiced PLB with patient. Patient understands why PLB is important and to use it when he is short of breath. Reviewed that oxygen saturations should be 88 percent and above. Patient has a pulse oximeter at home to check his oxygen. [place] has been doing well with 1 liter of oxygen on the sit down exercises but uses 2 liters when on the treadmill.    Goals/Expected Outcomes  Short: Become more profiecient at using PLB.   Long: Become independent at using PLB.  Short: use medication as prescribed. Long: Take medications independently and efficiently  Short: decrease oxygen to 1 liter for exercise. Long: maintain oxygen saturation on Room air while exercising.  Short: use PLB with exertion. Long: use PLB on exertion proficiently and independently.       Oxygen Discharge (Final Oxygen Re-Evaluation): Oxygen Re-Evaluation - 12/31/17 1350      Program Oxygen Prescription   Program Oxygen Prescription  Continuous    Liters per minute  2      Home Oxygen   Home Oxygen Device  Home Concentrator;E-Tanks    Sleep Oxygen  Prescription  Continuous    Liters per minute  2    Home Exercise Oxygen Prescription  Continuous    Liters per minute  3    Home at Rest Exercise Oxygen Prescription  Continuous    Liters per minute  3    Compliance with Home Oxygen Use  Yes      Goals/Expected Outcomes   Short Term Goals  To learn and understand importance of monitoring SPO2 with pulse oximeter and demonstrate accurate use of the pulse oximeter.;To learn and understand importance of maintaining oxygen saturations>88%;To learn and demonstrate proper pursed lip breathing techniques or other  breathing techniques.;To learn and demonstrate proper use of respiratory medications;To learn and exhibit compliance with exercise, home and travel O2 prescription    Long  Term Goals  Verbalizes importance of monitoring SPO2 with pulse oximeter and return demonstration;Maintenance of O2 saturations>88%;Exhibits proper breathing techniques, such as pursed lip breathing or other method taught during program session;Compliance with respiratory medication;Demonstrates proper use of MDI's;Exhibits compliance with exercise, home and travel O2 prescription    Comments  Practiced PLB with patient. Patient understands why PLB is important and to use it when he is short of breath. Reviewed that oxygen saturations should be 88 percent and above. Patient has a pulse oximeter at home to check his oxygen. [place] has been doing well with 1 liter of oxygen on the sit down exercises but uses 2 liters when on the treadmill.    Goals/Expected Outcomes  Short: use PLB with exertion. Long: use PLB on exertion proficiently and independently.       Initial Exercise Prescription: Initial Exercise Prescription - 10/06/17 1400      Date of Initial Exercise RX and Referring Provider   Date  10/06/17    Referring Provider  Ancil Linsey MD      Oxygen   Oxygen  Continuous    Liters  3      Treadmill   MPH  1.7    Grade  0    Minutes  15    METs  2.3       REL-XR   Level  3    Speed  50    Minutes  15    METs  2.3      T5 Nustep   Level  3    SPM  80    Minutes  15    METs  2.3      Prescription Details   Frequency (times per week)  3    Duration  Progress to 45 minutes of aerobic exercise without signs/symptoms of physical distress      Intensity   THRR 40-80% of Max Heartrate  116-154    Ratings of Perceived Exertion  11-13    Perceived Dyspnea  0-4      Progression   Progression  Continue to progress workloads to maintain intensity without signs/symptoms of physical distress.      Resistance Training   Training Prescription  Yes    Weight  3 lbs    Reps  10-15       Perform Capillary Blood Glucose checks as needed.  Exercise Prescription Changes: Exercise Prescription Changes    Row Name 10/06/17 1400 10/14/17 1300 10/22/17 1200 10/27/17 1400 11/10/17 1400     Response to Exercise   Blood Pressure (Admit)  130/60  120/74  -  128/70  120/64   Blood Pressure (Exercise)  138/74  138/82  -  -  -   Blood Pressure (Exit)  132/74  120/68  -  110/68  126/62   Heart Rate (Admit)  78 bpm  76 bpm  -  81 bpm  88 bpm   Heart Rate (Exercise)  102 bpm  105 bpm  -  102 bpm  98 bpm   Heart Rate (Exit)  87 bpm  90 bpm  -  87 bpm  88 bpm   Oxygen Saturation (Admit)  96 %  92 %  -  91 %  95 %   Oxygen Saturation (Exercise)  85 %  92 %  -  94 %  91 %   Oxygen Saturation (Exit)  94 %  99 %  -  98 %  98 %   Rating of Perceived Exertion (Exercise)  11  13  -  13  11   Perceived Dyspnea (Exercise)  2  3  -  2  1   Symptoms  SOB  SOB  -  SOB  SOB   Comments  walk test results  first full day of exercise  -  -  -   Duration  -  Progress to 45 minutes of aerobic exercise without signs/symptoms of physical distress  -  Continue with 45 min of aerobic exercise without signs/symptoms of physical distress.  Continue with 45 min of aerobic exercise without signs/symptoms of physical distress.   Intensity  -  THRR unchanged  -  THRR unchanged   THRR unchanged     Progression   Progression  -  Continue to progress workloads to maintain intensity without signs/symptoms of physical distress.  -  Continue to progress workloads to maintain intensity without signs/symptoms of physical distress.  Continue to progress workloads to maintain intensity without signs/symptoms of physical distress.   Average METs  -  2.9  -  2.67  2.7     Resistance Training   Training Prescription  -  Yes  -  Yes  Yes   Weight  -  3 lbs  -  4 lbs  4 lbs   Reps  -  10-15  -  10-15  10-15     Interval Training   Interval Training  -  No  -  No  No     Oxygen   Oxygen  -  Continuous  -  Continuous  Continuous   Liters  -  3  -  3  3     Treadmill   MPH  -  1.7  -  1.7  1.7   Grade  -  0  -  0  0   Minutes  -  15  -  15  15   METs  -  2.3  -  2.3  2.3     REL-XR   Level  -  3  -  3  3   Minutes  -  15  -  15  15   METs  -  3.5  -  3.7  3.8     T5 Nustep   Level  -  3  -  3  3   Minutes  -  15  -  15  15   METs  -  -  -  2  2     Home Exercise Plan   Plans to continue exercise at  -  -  Home (comment) walking  Home (comment) walking  Home (comment) walking   Frequency  -  -  Add 1 additional day to program exercise sessions.  Add 1 additional day to program exercise sessions.  Add 1 additional day to program exercise sessions.   Initial Home Exercises Provided  -  -  10/22/17  10/22/17  10/22/17   Row Name 11/26/17 1400 12/08/17 1400 12/23/17 1500 01/06/18 1500       Response to Exercise   Blood Pressure (Admit)  128/64  110/60  100/64  114/68    Blood Pressure (Exit)  112/56  106/58  116/68  122/64    Heart Rate (Admit)  93 bpm  88 bpm  95 bpm  90 bpm    Heart Rate (Exercise)  116 bpm  101 bpm  106 bpm  102 bpm    Heart Rate (Exit)  90 bpm  82 bpm  98 bpm  98 bpm    Oxygen Saturation (Admit)  93 %  91 %  94 %  89 %    Oxygen Saturation (Exercise)  93 %  94 %  96 %  97 %    Oxygen Saturation (Exit)  97 %  98 %  90 %  87 %    Rating of  Perceived Exertion (Exercise)  _0 Perceived Dyspnea (Exercise)  _1 Symptoms  SOB  SOB  SOB  SOB    Duration  Continue with 45 min of aerobic exercise without signs/symptoms of physical distress.  Continue with 45 min of aerobic exercise without signs/symptoms of physical distress.  Continue with 45 min of aerobic exercise without signs/symptoms of physical distress.  Continue with 45 min of aerobic exercise without signs/symptoms of physical distress.    Intensity  THRR unchanged  THRR unchanged  THRR unchanged  THRR unchanged      Progression   Progression  Continue to progress workloads to maintain intensity without signs/symptoms of physical distress.  Continue to progress workloads to maintain intensity without signs/symptoms of physical distress.  Continue to progress workloads to maintain intensity without signs/symptoms of physical distress.  Continue to progress workloads to maintain intensity without signs/symptoms of physical distress.    Average METs  2.97  3.14  2.91  3.14      Resistance Training   Training Prescription  Yes  Yes  Yes  Yes    Weight  4 lbs  4 lbs  4 lbs  4 lbs    Reps  10-15  10-15  10-15  10-15      Interval Training   Interval Training  No  No  No  No      Oxygen   Oxygen  Continuous  Continuous  Continuous  Continuous    Liters  _2 Treadmill   MPH  2.3  2.3  2.3  2.3    Grade  0.5  0.5  0.5  0.5    Minutes  _3 METs  2.92  2.92  2.92  2.92      REL-XR   Level  _4 Minutes  _5 METs  4.1  4.5  3.9  3.8      T5 Nustep   Level  _6 Minutes  _7 METs  2  2  1.9  2.7      Home Exercise Plan   Plans to continue exercise at  Home (comment) walking  Home (comment) walking  Home (comment) walking  Home (comment) walking    Frequency  Add 1 additional day to program exercise sessions.  Add 2 additional days to program exercise sessions.  Add 2 additional  days to program exercise sessions.  Add 2 additional days to program exercise sessions.    Initial Home Exercises Provided  10/22/17  10/22/17  10/22/17  10/22/17       Exercise Comments: Exercise Comments    Row Name 10/13/17 1508           Exercise Comments  First full day of exercise!  Patient was oriented to gym and equipment including functions, settings, policies, and procedures.  [name] individual exercise prescription and treatment plan were reviewed.  All starting workloads were established based on the results of the 6 minute walk test done at initial orientation visit.  The plan for exercise progression was also introduced and progression will be customized based on patient's performance and goals.          Exercise Goals and Review: Exercise Goals    Row Name 10/06/17 1424             Exercise Goals   Increase Physical Activity  Yes       Intervention  Provide advice, education, support and counseling about physical activity/exercise needs.;Develop an individualized exercise prescription for aerobic and resistive training based on initial evaluation findings, risk stratification, comorbidities and participant's personal goals.       Expected Outcomes  Short Term: Attend rehab on a regular basis to increase amount of physical activity.;Long Term: Add in home exercise to make exercise part of routine and to increase amount of physical activity.;Long Term: Exercising regularly at least 3-5 days a week.       Increase Strength and Stamina  Yes       Intervention  Provide advice, education, support and counseling about physical activity/exercise needs.;Develop an individualized exercise prescription for aerobic and resistive training based on initial evaluation findings, risk stratification, comorbidities and participant's personal goals.       Expected Outcomes  Short Term: Increase workloads from initial exercise prescription for resistance, speed, and METs.;Short Term:  Perform resistance training exercises routinely during rehab and add in resistance training at home;Long Term: Improve cardiorespiratory fitness, muscular endurance and strength as measured by increased METs and functional capacity (6MWT)       Able to understand and use rate of perceived exertion (RPE) scale  Yes       Intervention  Provide education and explanation on how to use RPE scale       Expected Outcomes  Short Term: Able to use RPE daily in rehab to express subjective intensity level;Long Term:  Able to use RPE to guide intensity level when exercising independently       Able to understand and use Dyspnea scale  Yes       Intervention  Provide education and explanation on how to use Dyspnea scale       Expected Outcomes  Short Term: Able to use Dyspnea scale daily in rehab to express subjective sense of shortness of breath during exertion;Long Term: Able to use Dyspnea scale to guide intensity level when exercising independently       Knowledge and understanding of Target Heart Rate Range (THRR)  Yes       Intervention  Provide education and explanation of THRR including how the numbers were predicted and where they are located for reference       Expected Outcomes  Short Term: Able to state/look up THRR;Short Term: Able to use daily as guideline for intensity in rehab;Long Term: Able to use THRR to govern intensity when exercising independently       Able to check pulse independently  Yes       Intervention  Provide education and demonstration on how to check pulse in  carotid and radial arteries.;Review the importance of being able to check your own pulse for safety during independent exercise       Expected Outcomes  Short Term: Able to explain why pulse checking is important during independent exercise;Long Term: Able to check pulse independently and accurately       Understanding of Exercise Prescription  Yes       Intervention  Provide education, explanation, and written materials on  patient's individual exercise prescription       Expected Outcomes  Short Term: Able to explain program exercise prescription;Long Term: Able to explain home exercise prescription to exercise independently          Exercise Goals Re-Evaluation : Exercise Goals Re-Evaluation    Row Name 10/13/17 1508 10/22/17 1233 10/27/17 1452 11/10/17 1352 11/26/17 1432     Exercise Goal Re-Evaluation   Exercise Goals Review  Increase Physical Activity;Increase Strength and Stamina;Able to understand and use rate of perceived exertion (RPE) scale;Able to understand and use Dyspnea scale  Increase Physical Activity;Increase Strength and Stamina;Understanding of Exercise Prescription  Increase Physical Activity;Increase Strength and Stamina;Understanding of Exercise Prescription  Increase Physical Activity;Increase Strength and Stamina;Understanding of Exercise Prescription  Increase Physical Activity;Increase Strength and Stamina;Understanding of Exercise Prescription   Comments  Reviewed RPE scale, THR and program prescription with pt today.  Pt voiced understanding and was given a copy of goals to take home.   Reviewed home exercise with pt today.  Pt plans to walk at home for exercise.  He is going to start with one day a week.  Reviewed THR, pulse, RPE, sign and symptoms, and when to call 911 or MD.  Also discussed weather considerations and indoor options.  Pt voiced understanding.  Desmund is doing well in rehab.  He is doing well on 3 liters for exercise but he still gets SOB on teh treadmill.  We will continue to try to increase workloads and monitor his progress.   Jaylan continues to well in rehab.  He is steady on his workloads but everything is finally down to RPE of 11 so we will increase his workloads. We will continue to monitor his progression.   Sevastian has been doing well in rehab.  He is now up to 2.3 mph on the treadmill.  We will continue to monitor his progression.    Expected Outcomes  Short: Use RPE  daily to regulate intensity. Long: Follow program prescription in THR.  Short: Add in at least one extra day a week.  Long: Continue to exercise independently  Short: Increase workloads.  Long: Continue to improve strength and stamina.   Short: Increase workloads.  Long: Continue to increase physical activity.   Short: Continue to work on [place].  Long: Continue to build strength and stamina.    [place] Name 12/08/17 1406 12/23/17 1510 01/06/18 1451         Exercise Goal Re-Evaluation   Exercise Goals Review  Increase Physical Activity;Increase Strength and Stamina;Understanding of Exercise Prescription  Increase Physical Activity;Increase Strength and Stamina;Understanding of Exercise Prescription  Increase Physical Activity;Increase Strength and Stamina;Understanding of Exercise Prescription     Comments  Shafin continues to do well in rehab.  He is up to level 6 on the XR.  We will continue to monitor his progress.   Kashius has only attended twice since last review due to transportation issues. Thus, he has not had much progress over the last two weeks.  We will continue to encourage better attendance  and monitor his progress.   Delson has only attended three times since last visit, but he was supposed to be getting his truck fixed this week.  We will continue to encourage attendance and montior his progress.      Expected Outcomes  Short: Increase T5 NuStep.  Long: Continue to improve stamina and possibly reduce oxygen use.   Short: Attend class regularly.  Long: Continue to increase physical activity.   Short: Return to class regularly.  Long: Continue to improve strength and stamina.         Discharge Exercise Prescription (Final Exercise Prescription Changes): Exercise Prescription Changes - 01/06/18 1500      Response to Exercise   Blood Pressure (Admit)  114/68    Blood Pressure (Exit)  122/64    Heart Rate (Admit)  90 bpm    Heart Rate (Exercise)  102 bpm    Heart Rate (Exit)   98 bpm    Oxygen Saturation (Admit)  89 %    Oxygen Saturation (Exercise)  97 %    Oxygen Saturation (Exit)  87 %    Rating of Perceived Exertion (Exercise)  14    Perceived Dyspnea (Exercise)  3    Symptoms  SOB    Duration  Continue with 45 min of aerobic exercise without signs/symptoms of physical distress.    Intensity  THRR unchanged      Progression   Progression  Continue to progress workloads to maintain intensity without signs/symptoms of physical distress.    Average METs  3.14      Resistance Training   Training Prescription  Yes    Weight  4 lbs    Reps  10-15      Interval Training   Interval Training  No      Oxygen   Oxygen  Continuous    Liters  1      Treadmill   MPH  2.3    Grade  0.5    Minutes  15    METs  2.92      REL-XR   Level  6    Minutes  15    METs  3.8      T5 Nustep   Level  4    Minutes  15    METs  2.7      Home Exercise Plan   Plans to continue exercise at  Home (comment)   walking   Frequency  Add 2 additional days to program exercise sessions.    Initial Home Exercises Provided  10/22/17       Nutrition:  Target Goals: Understanding of nutrition guidelines, daily intake of sodium <1539m, cholesterol <2042m calories 30% from fat and 7% or less from saturated fats, daily to have 5 or more servings of fruits and vegetables.  Biometrics: Pre Biometrics - 10/06/17 1424      Pre Biometrics   Height  5' 7.2" (1.707 m)    Weight  155 lb 14.4 oz (70.7 kg)    Waist Circumference  35 inches    Hip Circumference  37 inches    Waist to Hip Ratio  0.95 %    BMI (Calculated)  24.27        Nutrition Therapy Plan and Nutrition Goals: Nutrition Therapy & Goals - 10/22/17 1238      Nutrition Therapy   Diet  TLC    Protein (specify units)  9oz    Fiber  30 grams   likely not meeting  fiber goal   Whole Grain Foods  3 servings   does not usually choose whole grains   Saturated Fats  16 max. grams    Fruits and Vegetables  5  servings/day   8 ideal; does not eat fruit daily    Sodium  2000 grams   does not monitor sodium intake     Personal Nutrition Goals   Nutrition Goal  Start to pay attention to the sodium content in foods, particularly for pre-prepared meals and canned items. Choose foods with less than 365m of sodium    Personal Goal #2  Increase water consumption, start by swapping one soda for a glass of water per day    Personal Goal #3  Look in the frozen section for vegetables and beans, rather than choosing canned. They can be a cheap and quick-cook option that you can add as a side to any meal    Comments  He is looking to maintain is CBW. Eats several processed snack foods daily (usually in place of dinner), eats fried foods, does not monitor salt, and most meals consumed are ready-prepared/ frozen      Intervention Plan   Intervention  Prescribe, educate and counsel regarding individualized specific dietary modifications aiming towards targeted core components such as weight, hypertension, lipid management, diabetes, heart failure and other comorbidities.;Nutrition handout(s) given to patient.   nutrition guidelines for COPD handout   Expected Outcomes  Short Term Goal: Understand basic principles of dietary content, such as calories, fat, sodium, cholesterol and nutrients.;Short Term Goal: A plan has been developed with personal nutrition goals set during dietitian appointment.;Long Term Goal: Adherence to prescribed nutrition plan.       Nutrition Assessments:   Nutrition Goals Re-Evaluation: Nutrition Goals Re-Evaluation    [place] Name 10/22/17 1245 11/26/17 1228 12/24/17 1222         Goals   Nutrition Goal  Start to pay attention to the sodium content in foods, particularly for pre-prepared meals and canned items. Choose foods with less than 3019mof sodium  Start to pay attention to the sodium content in foods, particularly for canned or pre-packaged items; Increase fluid intake- ideally  swapping sodas for water; look for frozen vegetables as an alternative to canned as one way to reduce salt in your diet  Start to pay attention to the sodium content of foods, particularly canned and pre-packaged items; increase fluid intake; look for frozen vegetables rather than canned as a way to reduce sodium content     Comment  Currently he does not pay attention the sodium content in foods. His diet is high in pre-packaged foods, canned items, frozen meals, fried foods  He has started to be mindful of sodium intake and reports wt loss of ~5# and improved breathing as a result. He is choosing low sodium canned options and now buys no-salt frozen vegetables as well. Overall vegetable intake has incrased and he has swapped some sodas for water. He is not eating pre-prepared foods regularly  He feels he is doing better about monitoring sodium intake and does not buy as many canned vegetables/ frozen meals and instead has been cooking more. He has been doing more baking than frying for meats and tries to drink 1 gallon of water per day. He is reading some food labels but does not yet read them consistently. He has lost 4-5# and is eating fruits more regularly     Expected Outcome  He will start to read food labels and  become more aware of the salt in foods he consumes. Ideally, he will choose foods with less than 339m of sodium  He will continue to educate himself on a low sodium diet and choose food and beverage items which are lower in salt / that contain no salt. He will continue to decrease soda consumption and incrase water instead.  He will continue to make food swaps which will lower his total daily sodium intake and continue to choose non-fried cooking options. He will be more consistent about reading food labels and also continue to maintain increased fluid intake       Personal Goal #2 Re-Evaluation   Personal Goal #2  Increase water consumption, start by swapping one soda for a glass of water per  day  -  -       Personal Goal #3 Re-Evaluation   Personal Goal #3  Look in the frozen section for vegetables and beans, rather than choosing canned. They can be a cheap and quick- cook option that you can add as a side to any meal  -  -        Nutrition Goals Discharge (Final Nutrition Goals Re-Evaluation): Nutrition Goals Re-Evaluation - 12/24/17 1222      Goals   Nutrition Goal  Start to pay attention to the sodium content of foods, particularly canned and pre-packaged items; increase fluid intake; look for frozen vegetables rather than canned as a way to reduce sodium content    Comment  He feels he is doing better about monitoring sodium intake and does not buy as many canned vegetables/ frozen meals and instead has been cooking more. He has been doing more baking than frying for meats and tries to drink 1 gallon of water per day. He is reading some food labels but does not yet read them consistently. He has lost 4-5# and is eating fruits more regularly    Expected Outcome  He will continue to make food swaps which will lower his total daily sodium intake and continue to choose non-fried cooking options. He will be more consistent about reading food labels and also continue to maintain increased fluid intake       Psychosocial: Target Goals: Acknowledge presence or absence of significant depression and/or stress, maximize coping skills, provide positive support system. Participant is able to verbalize types and ability to use techniques and skills needed for reducing stress and depression.   Initial Review & Psychosocial Screening: Initial Psych Review & Screening - 10/06/17 1421      Initial Review   Current issues with  Current Stress Concerns    Source of Stress Concerns  Chronic Illness    Comments  His breathing is his main concern      Family Dynamics   Good Support System?  Yes    Comments  His oldest sister is good for support.      Barriers   Psychosocial barriers to  participate in program  The patient should benefit from training in stress management and relaxation.      Screening Interventions   Interventions  Encouraged to exercise;To provide support and resources with identified psychosocial needs;Program counselor consult;Provide feedback about the scores to participant    Expected Outcomes  Short Term goal: Utilizing psychosocial counselor, staff and physician to assist with identification of specific Stressors or current issues interfering with healing process. Setting desired goal for each stressor or current issue identified.;Long Term Goal: Stressors or current issues are controlled or eliminated.;Short Term goal:  Identification and review with participant of any Quality of Life or Depression concerns found by scoring the questionnaire.;Long Term goal: The participant improves quality of Life and PHQ9 Scores as seen by post scores and/or verbalization of changes       Quality of Life Scores:  Scores of 19 and below usually indicate a poorer quality of life in these areas.  A difference of  2-3 points is a clinically meaningful difference.  A difference of 2-3 points in the total score of the Quality of Life Index has been associated with significant improvement in overall quality of life, self-image, physical symptoms, and general health in studies assessing change in quality of life.  PHQ-9: Recent Review Flowsheet Data    Depression screen [facility] 2/9 01/14/2018 10/06/2017   Decreased Interest 0 3   Down, Depressed, Hopeless 0 3   PHQ - 2 Score 0 6   Altered sleeping 0 0   Tired, decreased energy 1 2   Change in appetite 0 0   Feeling bad or failure about yourself  0 0   Trouble concentrating 0 0   Moving slowly or fidgety/restless 0 0   Suicidal thoughts 0 0   PHQ-9 Score 1 8   Difficult doing work/chores Somewhat difficult Somewhat difficult     Interpretation of Total Score  Total Score Depression Severity:  1-4 = Minimal depression, 5-9 =  Mild depression, 10-14 = Moderate depression, 15-19 = Moderately severe depression, 20-27 = Severe depression   Psychosocial Evaluation and Intervention: Psychosocial Evaluation - 10/22/17 1238      Psychosocial Evaluation & Interventions   Interventions  Stress management education;Encouraged to exercise with the program and follow exercise prescription    Comments  Counselor met with Mr. Mcconaghy Clipper) today for initial psychosocial evaluation.  He is a 47 year old who has been diagnosed with COPD.  He has a limited support system with a sister in [place] and he lives with a friend.   Darry reports sleeping well and has a good appetite.  He denies a history of depression or anxiety or any current symptoms and is typically in a positive mood.  Karthikeya states he was a little depressed initially when he was put on oxygen full-time and lost interest in going places or doing things he used to enjoy.  But, he has since grown to accept this and is working towards getting off the oxygen by coming to this class and learning how to conserve energy; pace himself and used pursed lip breathing.  He reports progress already seen in a few short weeks with being able to park his truck and walk into the class without use of valet parking or being put in a wheel chair.  Counselor commended him on his progress made and his commitment to his goals.  Staff will follow with him.     Expected Outcomes  Short:  Eddison will continue to exercise consistently and walk in and out to his truck without assistance.   Long:  Isacc will develop strategies to breathe without full time oxygen; including consistent exercise.      Continue Psychosocial Services   Follow up required by staff       Psychosocial Re-Evaluation: Psychosocial Re-Evaluation    [place] Name 11/07/17 1149 12/17/17 1215 01/14/18 1224         Psychosocial Re-Evaluation   Current issues with  Current Stress Concerns  Current Stress Concerns  Current Stress  Concerns     Comments  Patient feels like his breathing has improved slightly. He feels like he can catch his breath faster which makes him more relaxed. He is focused to get his breathing better and has a good attitude.  His truck has been giving him problems and was having trouble getting around.. He states everything is going good. He wants to get off of his oxygen.  Reviewed patient health questionnaire (PHQ-9) with patient for follow up. Previously, patients score indicated signs/symptoms of depression.  Reviewed to see if patient is improving symptom wise while in program.  Score improved and patient states that it is because he has been coming to [place]. He has had his truck fixed and is able to get to [place].      Expected Outcomes  Short attened [facility] to decrease stress. Long: maintain an exercise program to keep stress at a minimum  Short: keep decreasing oxygen to increase mood. Long: keep a positive attitude and finish [facility].  Short: Continue to attend [facility] regularly for regular exercise and social engagement. Long: Continue to improve symptoms and manage a positive mental state     Interventions  Encouraged to attend [facility] for the exercise  -  Encouraged to attend [facility] for the exercise     Continue Psychosocial Services   Follow up required by staff  Follow up required by staff  Follow up required by staff        Psychosocial Discharge (Final Psychosocial Re-Evaluation): Psychosocial Re-Evaluation - 01/14/18 1224      Psychosocial Re-Evaluation   Current issues with  Current Stress Concerns    Comments  Reviewed patient health questionnaire (PHQ-9) with patient for follow up. Previously, patients score indicated signs/symptoms of depression.  Reviewed to see if patient is improving symptom wise while in program.  Score improved and patient states that it is because he has been coming to [place]. He has had his truck fixed and is  able to get to [place].     Expected Outcomes  Short: Continue to attend [facility] regularly for regular exercise and social engagement. Long: Continue to improve symptoms and manage a positive mental state    Interventions  Encouraged to attend [facility] for the exercise    Continue Psychosocial Services   Follow up required by staff       Education: Education Goals: Education classes will be provided on a weekly basis, covering required topics. Participant will state understanding/return demonstration of topics presented.  Learning Barriers/Preferences: Learning Barriers/Preferences - 10/06/17 1426      Learning Barriers/Preferences   Learning Barriers  None    Learning Preferences  None       Education Topics:  Initial Evaluation Education: - Verbal, written and demonstration of respiratory meds, oximetry and breathing techniques. Instruction on use of nebulizers and MDIs and importance of monitoring MDI activations.   Pulmonary Rehab from 01/16/2018 in [facility] Cardiac and Pulmonary Rehab  Date  10/06/17  Educator  [facility]  Instruction Review Code  1- Verbalizes Understanding      General Nutrition Guidelines/Fats and Fiber: -Group instruction provided by verbal, written material, models and posters to present the general guidelines for heart healthy nutrition. Gives an explanation and review of dietary fats and fiber.   Pulmonary Rehab from 01/16/2018 in [facility] Cardiac and Pulmonary Rehab  Date  11/12/17  Educator  [facility]  Instruction Review Code  1- Verbalizes Understanding      Controlling Sodium/Reading Food Labels: -Group verbal and written material supporting the discussion of  sodium use in heart healthy nutrition. Review and explanation with models, verbal and written materials for utilization of the food label.   Pulmonary Rehab from 01/16/2018 in [facility] Cardiac and Pulmonary Rehab  Date  11/19/17  Educator  [facility]  Instruction Review Code  1- Verbalizes  Understanding      Exercise Physiology & General Exercise Guidelines: - Group verbal and written instruction with models to review the exercise physiology of the cardiovascular system and associated critical values. Provides general exercise guidelines with specific guidelines to those with heart or lung disease.    Pulmonary Rehab from 01/16/2018 in [facility] Cardiac and Pulmonary Rehab  Date  11/26/17  Educator  [facility]  Instruction Review Code  1- Verbalizes Understanding      Aerobic Exercise & Resistance Training: - Gives group verbal and written instruction on the various components of exercise. Focuses on aerobic and resistive training programs and the benefits of this training and how to safely progress through these programs.   Flexibility, Balance, Mind/Body Relaxation: Provides group verbal/written instruction on the benefits of flexibility and balance training, including mind/body exercise modes such as yoga, pilates and tai chi.  Demonstration and skill practice provided.   Pulmonary Rehab from 01/16/2018 in [facility] Cardiac and Pulmonary Rehab  Date  12/24/17  Educator  [facility]  Instruction Review Code  1- Verbalizes Understanding      Stress and Anxiety: - Provides group verbal and written instruction about the health risks of elevated stress and causes of high stress.  Discuss the correlation between heart/lung disease and anxiety and treatment options. Review healthy ways to manage with stress and anxiety.   Pulmonary Rehab from 01/16/2018 in [facility] Cardiac and Pulmonary Rehab  Date  01/14/18  Educator  [facility]  Instruction Review Code  1- Verbalizes Understanding      Depression: - Provides group verbal and written instruction on the correlation between heart/lung disease and depressed mood, treatment options, and the stigmas associated with seeking treatment.   Pulmonary Rehab from 01/16/2018 in [facility] Cardiac and Pulmonary Rehab  Date  12/17/17  Educator  [facility]  Instruction Review Code   1- Verbalizes Understanding      Exercise & Equipment Safety: - Individual verbal instruction and demonstration of equipment use and safety with use of the equipment.   Pulmonary Rehab from 01/16/2018 in [facility] Cardiac and Pulmonary Rehab  Date  10/06/17  Educator  [facility]  Instruction Review Code  1- Verbalizes Understanding      Infection Prevention: - Provides verbal and written material to individual with discussion of infection control including proper hand washing and proper equipment cleaning during exercise session.   Pulmonary Rehab from 01/16/2018 in [facility] Cardiac and Pulmonary Rehab  Date  10/06/17  Educator  [facility]  Instruction Review Code  1- Verbalizes Understanding      Falls Prevention: - Provides verbal and written material to individual with discussion of falls prevention and safety.   Pulmonary Rehab from 01/16/2018 in [facility] Cardiac and Pulmonary Rehab  Date  10/06/17  Educator  [facility]  Instruction Review Code  1- Verbalizes Understanding      Diabetes: - Individual verbal and written instruction to review signs/symptoms of diabetes, desired ranges of glucose level fasting, after meals and with exercise. Advice that pre and post exercise glucose checks will be done for 3 sessions at entry of program.   Chronic Lung Diseases: - Group verbal and written instruction to review updates, respiratory medications, advancements in procedures and treatments. Discuss use of  supplemental oxygen including available portable oxygen systems, continuous and intermittent flow rates, concentrators, personal use and safety guidelines. Review proper use of inhaler and spacers. Provide informative websites for self-education.    Pulmonary Rehab from 01/16/2018 in [facility] Cardiac and Pulmonary Rehab  Date  11/14/17  Educator  [facility]  Instruction Review Code  -- Shona Needles to walk during class, heard most from his physician]      Energy Conservation: - Provide group verbal and written instruction for  methods to conserve energy, plan and organize activities. Instruct on pacing techniques, use of adaptive equipment and posture/positioning to relieve shortness of breath.   Pulmonary Rehab from 01/16/2018 in [facility] Cardiac and Pulmonary Rehab  Date  12/31/17  Educator  [facility]  Instruction Review Code  1- Verbalizes Understanding      Triggers and Exacerbations: - Group verbal and written instruction to review types of environmental triggers and ways to prevent exacerbations. Discuss weather changes, air quality and the benefits of nasal washing. Review warning signs and symptoms to help prevent infections. Discuss techniques for effective airway clearance, coughing, and vibrations.   Pulmonary Rehab from 01/16/2018 in [facility] Cardiac and Pulmonary Rehab  Date  12/03/17  Educator  [facility]  Instruction Review Code  1- Verbalizes Understanding      AED/CPR: - Group verbal and written instruction with the use of models to demonstrate the basic use of the AED with the basic ABC's of resuscitation.   Pulmonary Rehab from 01/16/2018 in [facility] Cardiac and Pulmonary Rehab  Date  10/17/17  Educator  [facility]  Instruction Review Code  1- Actuary and Physiology of the Lungs: - Group verbal and written instruction with the use of models to provide basic lung anatomy and physiology related to function, structure and complications of lung disease.   Pulmonary Rehab from 01/16/2018 in [facility] Cardiac and Pulmonary Rehab  Date  01/16/18  Educator  [facility]  Instruction Review Code  1- Verbalizes Understanding      Anatomy & Physiology of the Heart: - Group verbal and written instruction and models provide basic cardiac anatomy and physiology, with the coronary electrical and arterial systems. Review of Valvular disease and Heart Failure   Cardiac Medications: - Group verbal and written instruction to review commonly prescribed medications for heart disease. Reviews the medication, class of the  drug, and side effects.   Pulmonary Rehab from 01/16/2018 in [facility] Cardiac and Pulmonary Rehab  Date  12/26/17  Educator  [facility]  Instruction Review Code  1- Verbalizes Understanding      Know Your Numbers and Risk Factors: -Group verbal and written instruction about important numbers in your health.  Discussion of what are risk factors and how they play a role in the disease process.  Review of Cholesterol, Blood Pressure, Diabetes, and BMI and the role they play in your overall health.   Pulmonary Rehab from 01/16/2018 in [facility] Cardiac and Pulmonary Rehab  Date  11/28/17  Educator  [facility]  Instruction Review Code  1- Verbalizes Understanding      Sleep Hygiene: -Provides group verbal and written instruction about how sleep can affect your health.  Define sleep hygiene, discuss sleep cycles and impact of sleep habits. Review good sleep hygiene tips.    Other: -Provides group and verbal instruction on various topics (see comments)    Knowledge Questionnaire Score: Knowledge Questionnaire Score - 10/06/17 1405      Knowledge Questionnaire Score   Pre Score  12/18  Reviewed with patient       Core Components/Risk Factors/Patient Goals at Admission: Personal Goals and Risk Factors at Admission - 10/06/17 1431      Core Components/Risk Factors/Patient Goals on Admission    Weight Management  Yes;Weight Maintenance    Intervention  Weight Management: Develop a combined nutrition and exercise program designed to reach desired caloric intake, while maintaining appropriate intake of nutrient and fiber, sodium and fats, and appropriate energy expenditure required for the weight goal.;Weight Management: Provide education and appropriate resources to help participant work on and attain dietary goals.;Weight Management/Obesity: Establish reasonable short term and long term weight goals.    Admit Weight  155 lb 14.4 oz (70.7 kg)    Goal Weight: Short Term  155 lb (70.3 kg)    Goal Weight: Long  Term  155 lb (70.3 kg)    Expected Outcomes  Short Term: Continue to assess and modify interventions until short term weight is achieved;Long Term: Adherence to nutrition and physical activity/exercise program aimed toward attainment of established weight goal;Weight Maintenance: Understanding of the daily nutrition guidelines, which includes 25-35% calories from fat, 7% or less cal from saturated fats, less than 216m cholesterol, less than 1.5gm of sodium, & 5 or more servings of fruits and vegetables daily;Understanding recommendations for meals to include 15-35% energy as protein, 25-35% energy from fat, 35-60% energy from carbohydrates, less than 2067mof dietary cholesterol, 20-35 gm of total fiber daily;Understanding of distribution of calorie intake throughout the day with the consumption of 4-5 meals/snacks    Improve shortness of breath with ADL's  Yes    Intervention  Provide education, individualized exercise plan and daily activity instruction to help decrease symptoms of SOB with activities of daily living.    Expected Outcomes  Long Term: Be able to perform more ADLs without symptoms or delay the onset of symptoms;Short Term: Improve cardiorespiratory fitness to achieve a reduction of symptoms when performing ADLs       Core Components/Risk Factors/Patient Goals Review:  Goals and Risk Factor Review    Row Name 11/07/17 1153 12/17/17 1219 01/14/18 1233         Core Components/Risk Factors/Patient Goals Review   Personal Goals Review  Weight Management/Obesity;Improve shortness of breath with ADL's  Weight Management/Obesity;Improve shortness of breath with ADL's  Weight Management/Obesity;Improve shortness of breath with ADL's;Other     Review  Patient has been doing well with his weight. He gets short of breath when he eats. When he lays down and gets up he gets short of breath. Grocery shopping is hard for him if he takes it slow. He  has his oxygen in his truck and takes his oxygen  with him at all times.   His breathing has been better. HaAlucardas more of an understanding about his illness. Patient is using 1 liter of oxygen while exercising and hopes to get off of oxygen.  HaJerrieas been out due to his truck not running. He has since had it fixed and will attend [facility] regularly. Shortness of breath has been worse in the morning. When he gets up and starts moving he feels better. He is maintaining his weight at 150lbs.      Expected Outcomes  Short: attened [facility] to improve ADLs. Long: Maintain ADLs independently while checking oxygen.  Short: decrease oxygen to room air. Long: stay off oxygen while maintaing ADL.  Short: attend [facility] regularly now that his truck is fixed. Long: graduate [employer]  Core Components/Risk Factors/Patient Goals at Discharge (Final Review):  Goals and Risk Factor Review - 01/14/18 1233      Core Components/Risk Factors/Patient Goals Review   Personal Goals Review  Weight Management/Obesity;Improve shortness of breath with ADL's;Other    Review  Olaf has been out due to his truck not running. He has since had it fixed and will attend [facility] regularly. Shortness of breath has been worse in the morning. When he gets up and starts moving he feels better. He is maintaining his weight at 150lbs.     Expected Outcomes  Short: attend [facility] regularly now that his truck is fixed. Long: graduate [employer].       ITP Comments: ITP Comments    Row Name 10/06/17 1403 10/27/17 0835 11/24/17 0837 12/22/17 0855 01/19/18 0841   ITP Comments  Medical Evaluation completed. Chart sent for review and changes to Dr. Caryl Comes for Dr. Emily Filbert Director of [place]. Diagnosis can be found in [facility] encounter 09/02/16  30 day review completed. ITP sent to Dr. Emily Filbert Director of [place]. Continue with ITP unless changes are made by physician  30 day review completed. ITP sent to Dr. Emily Filbert Director of [place]. Continue with ITP unless  changes are made by physician  30 day review completed. ITP sent to Dr. Emily Filbert Director of [place]. Continue with ITP unless changes are made by physician.  30 day review completed. ITP sent to Dr. Emily Filbert Director of [place]. Continue with ITP unless changes are made by physician.      Comments: 30 day review

## 2018-01-21 ENCOUNTER — Encounter: Payer: Medicaid Other | Admitting: *Deleted

## 2018-01-21 DIAGNOSIS — J449 Chronic obstructive pulmonary disease, unspecified: Secondary | ICD-10-CM | POA: Diagnosis not present

## 2018-01-21 NOTE — Progress Notes (Signed)
Daily Session Note  Patient Details  Name: Chris Case MRN: [id number] Date of Birth: 04-21-1970 Referring Provider:     Pulmonary Rehab from 10/06/2017 in [facility] Cardiac and Pulmonary Rehab  Referring Provider  Ancil Linsey MD      Encounter Date: 01/21/2018  Check In: Session Check In - 01/21/18 1131      Check-In   Supervising physician immediately available to respond to emergencies  [facility] immediately available ER MD    Physician(s)  Drs. Cinda Quest and [facility]    Location  [facility]-Cardiac & Pulmonary Rehab    Staff Present  Renita Papa, RN BSN;Jessica Luan [place], [place], RCEP, CCRP, Exercise Physiologist;Joseph Tessie Fass RCP,RRT,BSRT    Medication changes reported      No    Fall or balance concerns reported     No    Warm-up and Cool-down  Performed as group-led instruction    Resistance Training Performed  Yes    VAD Patient?  No    PAD/SET Patient?  No      Pain Assessment   Currently in Pain?  No/denies          Social History   Tobacco Use  Smoking Status Former Smoker  . Packs/day: 3.00  . Years: 20.00  . Pack years: 60.00  . Types: Cigarettes  . Last attempt to quit: 04/11/2016  . Years since quitting: 1.7  Smokeless Tobacco Never Used    Goals Met:  Proper associated with RPD/PD & O2 Sat Independence with exercise equipment Using PLB without cueing & demonstrates good technique Exercise tolerated well No report of cardiac concerns or symptoms Strength training completed today  Goals Unmet:  Not Applicable  Comments: Pt able to follow exercise prescription today without complaint.  Will continue to monitor for progression.    Dr. Emily Filbert is Medical Director for [place] and [facility].

## 2018-01-23 VITALS — Ht 67.2 in | Wt 150.0 lb

## 2018-01-23 DIAGNOSIS — J449 Chronic obstructive pulmonary disease, unspecified: Secondary | ICD-10-CM | POA: Diagnosis not present

## 2018-01-23 NOTE — Progress Notes (Signed)
Daily Session Note  Patient Details  Name: Chris Case MRN: [id number] Date of Birth: 04/12/1970 Referring Provider:     Pulmonary Rehab from 10/06/2017 in [facility] Cardiac and Pulmonary Rehab  Referring Provider  Fleming, [place] MD      Encounter Date: 01/23/2018  Check In: Session Check In - 01/23/18 1112      Check-In   Supervising physician immediately available to respond to emergencies  [facility] immediately available ER MD    Physician(s)  Williams and Robinson    Location  [facility]-Cardiac & Pulmonary Rehab    Staff Present  Meredith Craven, RN BSN;Joseph Hood RCP,RRT,BSRT;Amanda Sommer, BA, ACSM CEP, Exercise Physiologist    Medication changes reported      No    Fall or balance concerns reported     No    Warm-up and Cool-down  Performed as group-led instruction    Resistance Training Performed  Yes    VAD Patient?  No    PAD/SET Patient?  No      Pain Assessment   Currently in Pain?  No/denies    Multiple Pain Sites  No          Social History   Tobacco Use  Smoking Status Former Smoker  . Packs/day: 3.00  . Years: 20.00  . Pack years: 60.00  . Types: Cigarettes  . Last attempt to quit: 04/11/2016  . Years since quitting: 1.7  Smokeless Tobacco Never Used    Goals Met:  Proper associated with RPD/PD & O2 Sat Independence with exercise equipment Exercise tolerated well No report of cardiac concerns or symptoms Strength training completed today  Goals Unmet:  Not Applicable  Comments:  6 Minute Walk    Row Name 10/06/17 1420 01/23/18 1114       6 Minute Walk   Phase  Initial  Discharge    Distance  920 feet  935 feet    Distance % Change  -  1.6 %    Distance Feet Change  -  15 ft    Walk Time  6 minutes  5.5 minutes    # of Rest Breaks  0  2    MPH  1.74  1.77    METS  3.75  4    RPE  11  11    Perceived Dyspnea   2  3    VO2 Peak  13.14  -    Symptoms  Yes (comment)  Yes (comment)    Comments  SOB  has had a cold and cant breathe well    Resting HR  78 bpm  91 bpm    Resting BP  130/60  112/62    Resting Oxygen Saturation   96 %  94 %    Exercise Oxygen Saturation  during 6 min walk  85 %  84 %    Max Ex. HR  102 bpm  110 bpm    Max Ex. BP  138/74  148/78    2 Minute Post BP  132/74  -      Interval HR   1 Minute HR  93  109    2 Minute HR  94  102    3 Minute HR  96  105    4 Minute HR  100  107    5 Minute HR  101  106    6 Minute HR  102  110    2 Minute Post HR  87  -      Interval Heart Rate?  Yes  Yes      Interval Oxygen   Interval Oxygen?  Yes  Yes    Baseline Oxygen Saturation %  96 %  94 %    1 Minute Oxygen Saturation %  92 %  87 %    1 Minute Liters of Oxygen  3 L  2 L    2 Minute Oxygen Saturation %  92 % 2:33 83%  86 %    2 Minute Liters of Oxygen  3 L  2 L    3 Minute Oxygen Saturation %  88 %  84 %    3 Minute Liters of Oxygen  3 L  2 L    4 Minute Oxygen Saturation %  86 %  84 %    4 Minute Liters of Oxygen  3 L  2 L    5 Minute Oxygen Saturation %  86 %  84 %    5 Minute Liters of Oxygen  3 L  2 L    6 Minute Oxygen Saturation %  86 % 6:05 85%  84 %    6 Minute Liters of Oxygen  3 L  2 L    2 Minute Post Oxygen Saturation %  94 %  89 %    2 Minute Post Liters of Oxygen  3 L  2 L          Dr. Mark Miller is Medical Director for [facility] and [facility]. 

## 2018-01-26 DIAGNOSIS — J449 Chronic obstructive pulmonary disease, unspecified: Secondary | ICD-10-CM

## 2018-01-26 NOTE — Progress Notes (Signed)
Daily Session Note  Patient Details  Name: Chris Case MRN: [id number] Date of Birth: 1970/03/06 Referring Provider:     Pulmonary Rehab from 10/06/2017 in [facility] Cardiac and Pulmonary Rehab  Referring Provider  Ancil Linsey MD      Encounter Date: 01/26/2018  Check In: Session Check In - 01/26/18 1357      Check-In   Supervising physician immediately available to respond to emergencies  [facility] immediately available ER MD    Physician(s)  Dr. Joni Fears and Jimmye Norman    Location  [facility]-Cardiac & Pulmonary Rehab    Staff Present  Nada Maclachlan, BA, ACSM CEP, Exercise Physiologist;Ralpheal Zappone [facility] [place], [place], ACSM CEP, Exercise Physiologist    Medication changes reported      No    Fall or balance concerns reported     No    Warm-up and Cool-down  Performed as group-led instruction    Resistance Training Performed  Yes    VAD Patient?  No    PAD/SET Patient?  No      Pain Assessment   Currently in Pain?  No/denies          Social History   Tobacco Use  Smoking Status Former Smoker  . Packs/day: 3.00  . Years: 20.00  . Pack years: 60.00  . Types: Cigarettes  . Last attempt to quit: 04/11/2016  . Years since quitting: 1.7  Smokeless Tobacco Never Used    Goals Met:  Independence with exercise equipment Exercise tolerated well No report of cardiac concerns or symptoms Strength training completed today  Goals Unmet:  Not Applicable  Comments: Pt able to follow exercise prescription today without complaint.  Will continue to monitor for progression.    Dr. Emily Filbert is Medical Director for [place] and [facility].

## 2018-01-28 DIAGNOSIS — J449 Chronic obstructive pulmonary disease, unspecified: Secondary | ICD-10-CM

## 2018-01-28 NOTE — Progress Notes (Signed)
Daily Session Note  Patient Details  Name: Chris Case MRN: [id number] Date of Birth: [birth year] Referring Provider:     Pulmonary Rehab from 10/06/2017 in [facility] Cardiac and Pulmonary Rehab  Referring Provider  Ancil Linsey MD      Encounter Date: 01/28/2018  Check In: Session Check In - 01/28/18 1142      Check-In   Supervising physician immediately available to respond to emergencies  [facility] immediately available ER MD    Physician(s)  Dr. Quentin Cornwall and Darl Householder    Location  [facility]-Cardiac & Pulmonary Rehab    Staff Present  Carson Myrtle, BS, RRT, Respiratory Therapist;Meredith Sherryll Burger, RN BSN;Keianna Signer RCP,RRT,BSRT    Medication changes reported      No    Fall or balance concerns reported     No    Warm-up and Cool-down  Performed as group-led instruction    Resistance Training Performed  Yes    VAD Patient?  No    PAD/SET Patient?  No      Pain Assessment   Currently in Pain?  No/denies          Social History   Tobacco Use  Smoking Status Former Smoker  . Packs/day: 3.00  . Years: 20.00  . Pack years: 60.00  . Types: Cigarettes  . Last attempt to quit: 04/11/2016  . Years since quitting: 1.8  Smokeless Tobacco Never Used    Goals Met:  Independence with exercise equipment Exercise tolerated well No report of cardiac concerns or symptoms Strength training completed today  Goals Unmet:  Not Applicable  Comments: Pt able to follow exercise prescription today without complaint.  Will continue to monitor for progression.    Dr. Emily Filbert is Medical Director for [place] and [facility].

## 2018-02-02 ENCOUNTER — Encounter: Payer: Medicaid Other | Attending: Specialist | Admitting: *Deleted

## 2018-02-02 DIAGNOSIS — J449 Chronic obstructive pulmonary disease, unspecified: Secondary | ICD-10-CM | POA: Diagnosis not present

## 2018-02-02 NOTE — Patient Instructions (Signed)
Discharge Patient Instructions  Patient Details  Name: Chris Case MRN: [id number] Date of Birth: 01/16/1971 Referring Provider:  Erby Pian, MD   Number of Visits: 37  Reason for Discharge:  Patient reached a stable level of exercise. Patient independent in their exercise. Patient has met program and personal goals.  Smoking History:  Social History   Tobacco Use  Smoking Status Former Smoker  . Packs/day: 3.00  . Years: 20.00  . Pack years: 60.00  . Types: Cigarettes  . Last attempt to quit: 04/11/2016  . Years since quitting: 1.8  Smokeless Tobacco Never Used    Diagnosis:  Chronic obstructive pulmonary disease, unspecified COPD type ([place])  Initial Exercise Prescription: Initial Exercise Prescription - 10/06/17 1400      Date of Initial Exercise RX and Referring Provider   Date  10/06/17    Referring Provider  Ancil Linsey MD      Oxygen   Oxygen  Continuous    Liters  3      Treadmill   MPH  1.7    Grade  0    Minutes  15    METs  2.3      REL-XR   Level  3    Speed  50    Minutes  15    METs  2.3      T5 Nustep   Level  3    SPM  80    Minutes  15    METs  2.3      Prescription Details   Frequency (times per week)  3    Duration  Progress to 45 minutes of aerobic exercise without signs/symptoms of physical distress      Intensity   THRR 40-80% of Max Heartrate  116-154    Ratings of Perceived Exertion  11-13    Perceived Dyspnea  0-4      Progression   Progression  Continue to progress workloads to maintain intensity without signs/symptoms of physical distress.      Resistance Training   Training Prescription  Yes    Weight  3 lbs    Reps  10-15       Discharge Exercise Prescription (Final Exercise Prescription Changes): Exercise Prescription Changes - 02/02/18 1600      Response to Exercise   Blood Pressure (Admit)  116/68    Blood Pressure (Exit)  122/70    Heart Rate (Admit)  91 bpm    Heart Rate (Exercise)  107  bpm    Heart Rate (Exit)  99 bpm    Oxygen Saturation (Admit)  91 %    Oxygen Saturation (Exercise)  95 %    Oxygen Saturation (Exit)  97 %    Rating of Perceived Exertion (Exercise)  13    Perceived Dyspnea (Exercise)  2    Symptoms  SOB    Duration  Continue with 45 min of aerobic exercise without signs/symptoms of physical distress.    Intensity  THRR unchanged      Progression   Progression  Continue to progress workloads to maintain intensity without signs/symptoms of physical distress.    Average METs  3.17      Resistance Training   Training Prescription  Yes    Weight  4 lbs    Reps  10-15      Interval Training   Interval Training  No      Oxygen   Oxygen  Continuous    Liters  2  Treadmill   MPH  2.3    Grade  0.5    Minutes  15    METs  2.92      REL-XR   Level  6    Minutes  15    METs  4.6      T5 Nustep   Level  4    Minutes  15    METs  2      Home Exercise Plan   Plans to continue exercise at  Home (comment)   walking   Frequency  Add 2 additional days to program exercise sessions.    Initial Home Exercises Provided  10/22/17       Functional Capacity: 6 Minute Walk    Row Name 10/06/17 1420 01/23/18 1114       6 Minute Walk   Phase  Initial  Discharge    Distance  920 feet  935 feet    Distance % Change  -  1.6 %    Distance Feet Change  -  15 ft    Walk Time  6 minutes  5.5 minutes    # of Rest Breaks  0  2    MPH  1.74  1.77    METS  3.75  4    RPE  11  11    Perceived Dyspnea   2  3    VO2 Peak  13.14  -    Symptoms  Yes (comment)  Yes (comment)    Comments  SOB  has had a cold and cant breathe well    Resting HR  78 bpm  91 bpm    Resting BP  130/60  112/62    Resting Oxygen Saturation   96 %  94 %    Exercise Oxygen Saturation  during 6 min walk  85 %  84 %    Max Ex. HR  102 bpm  110 bpm    Max Ex. BP  138/74  148/78    2 Minute Post BP  132/74  -      Interval HR   1 Minute HR  93  109    2 Minute HR  94  102     3 Minute HR  96  105    4 Minute HR  100  107    5 Minute HR  101  106    6 Minute HR  102  110    2 Minute Post HR  87  -    Interval Heart Rate?  Yes  Yes      Interval Oxygen   Interval Oxygen?  Yes  Yes    Baseline Oxygen Saturation %  96 %  94 %    1 Minute Oxygen Saturation %  92 %  87 %    1 Minute Liters of Oxygen  3 L  2 L    2 Minute Oxygen Saturation %  92 % 2:33 83%  86 %    2 Minute Liters of Oxygen  3 L  2 L    3 Minute Oxygen Saturation %  88 %  84 %    3 Minute Liters of Oxygen  3 L  2 L    4 Minute Oxygen Saturation %  86 %  84 %    4 Minute Liters of Oxygen  3 L  2 L    5 Minute Oxygen Saturation %  86 %  84 %  5 Minute Liters of Oxygen  3 L  2 L    6 Minute Oxygen Saturation %  86 % 6:05 85%  84 %    6 Minute Liters of Oxygen  3 L  2 L    2 Minute Post Oxygen Saturation %  94 %  89 %    2 Minute Post Liters of Oxygen  3 L  2 L       Quality of Life:   Personal Goals: Goals established at orientation with interventions provided to work toward goal. Personal Goals and Risk Factors at Admission - 10/06/17 1431      Core Components/Risk Factors/Patient Goals on Admission    Weight Management  Yes;Weight Maintenance    Intervention  Weight Management: Develop a combined nutrition and exercise program designed to reach desired caloric intake, while maintaining appropriate intake of nutrient and fiber, sodium and fats, and appropriate energy expenditure required for the weight goal.;Weight Management: Provide education and appropriate resources to help participant work on and attain dietary goals.;Weight Management/Obesity: Establish reasonable short term and long term weight goals.    Admit Weight  155 lb 14.4 oz (70.7 kg)    Goal Weight: Short Term  155 lb (70.3 kg)    Goal Weight: Long Term  155 lb (70.3 kg)    Expected Outcomes  Short Term: Continue to assess and modify interventions until short term weight is achieved;Long Term: Adherence to nutrition and  physical activity/exercise program aimed toward attainment of established weight goal;Weight Maintenance: Understanding of the daily nutrition guidelines, which includes 25-35% calories from fat, 7% or less cal from saturated fats, less than 269m cholesterol, less than 1.5gm of sodium, & 5 or more servings of fruits and vegetables daily;Understanding recommendations for meals to include 15-35% energy as protein, 25-35% energy from fat, 35-60% energy from carbohydrates, less than 2056mof dietary cholesterol, 20-35 gm of total fiber daily;Understanding of distribution of calorie intake throughout the day with the consumption of 4-5 meals/snacks    Improve shortness of breath with ADL's  Yes    Intervention  Provide education, individualized exercise plan and daily activity instruction to help decrease symptoms of SOB with activities of daily living.    Expected Outcomes  Long Term: Be able to perform more ADLs without symptoms or delay the onset of symptoms;Short Term: Improve cardiorespiratory fitness to achieve a reduction of symptoms when performing ADLs        Personal Goals Discharge: Goals and Risk Factor Review - 01/14/18 1233      Core Components/Risk Factors/Patient Goals Review   Personal Goals Review  Weight Management/Obesity;Improve shortness of breath with ADL's;Other    Review  HaLawsenas been out due to his truck not running. He has since had it fixed and will attend [facility] regularly. Shortness of breath has been worse in the morning. When he gets up and starts moving he feels better. He is maintaining his weight at 150lbs.     Expected Outcomes  Short: attend [facility] regularly now that his truck is fixed. Long: graduate [facility]      Exercise Goals and Review: Exercise Goals    Row Name 10/06/17 1424             Exercise Goals   Increase Physical Activity  Yes       Intervention  Provide advice, education, support and counseling about physical activity/exercise  needs.;Develop an individualized exercise prescription for aerobic and resistive training based on initial evaluation findings,  risk stratification, comorbidities and participant's personal goals.       Expected Outcomes  Short Term: Attend rehab on a regular basis to increase amount of physical activity.;Long Term: Add in home exercise to make exercise part of routine and to increase amount of physical activity.;Long Term: Exercising regularly at least 3-5 days a week.       Increase Strength and Stamina  Yes       Intervention  Provide advice, education, support and counseling about physical activity/exercise needs.;Develop an individualized exercise prescription for aerobic and resistive training based on initial evaluation findings, risk stratification, comorbidities and participant's personal goals.       Expected Outcomes  Short Term: Increase workloads from initial exercise prescription for resistance, speed, and METs.;Short Term: Perform resistance training exercises routinely during rehab and add in resistance training at home;Long Term: Improve cardiorespiratory fitness, muscular endurance and strength as measured by increased METs and functional capacity (6MWT)       Able to understand and use rate of perceived exertion (RPE) scale  Yes       Intervention  Provide education and explanation on how to use RPE scale       Expected Outcomes  Short Term: Able to use RPE daily in rehab to express subjective intensity level;Long Term:  Able to use RPE to guide intensity level when exercising independently       Able to understand and use Dyspnea scale  Yes       Intervention  Provide education and explanation on how to use Dyspnea scale       Expected Outcomes  Short Term: Able to use Dyspnea scale daily in rehab to express subjective sense of shortness of breath during exertion;Long Term: Able to use Dyspnea scale to guide intensity level when exercising independently       Knowledge and  understanding of Target Heart Rate Range (THRR)  Yes       Intervention  Provide education and explanation of THRR including how the numbers were predicted and where they are located for reference       Expected Outcomes  Short Term: Able to state/look up THRR;Short Term: Able to use daily as guideline for intensity in rehab;Long Term: Able to use THRR to govern intensity when exercising independently       Able to check pulse independently  Yes       Intervention  Provide education and demonstration on how to check pulse in carotid and radial arteries.;Review the importance of being able to check your own pulse for safety during independent exercise       Expected Outcomes  Short Term: Able to explain why pulse checking is important during independent exercise;Long Term: Able to check pulse independently and accurately       Understanding of Exercise Prescription  Yes       Intervention  Provide education, explanation, and written materials on patient's individual exercise prescription       Expected Outcomes  Short Term: Able to explain program exercise prescription;Long Term: Able to explain home exercise prescription to exercise independently          Exercise Goals Re-Evaluation: Exercise Goals Re-Evaluation    Row Name 10/13/17 1508 10/22/17 1233 10/27/17 1452 11/10/17 1352 11/26/17 1432     Exercise Goal Re-Evaluation   Exercise Goals Review  Increase Physical Activity;Increase Strength and Stamina;Able to understand and use rate of perceived exertion (RPE) scale;Able to understand and use Dyspnea scale  Increase Physical Activity;Increase  Strength and Stamina;Understanding of Exercise Prescription  Increase Physical Activity;Increase Strength and Stamina;Understanding of Exercise Prescription  Increase Physical Activity;Increase Strength and Stamina;Understanding of Exercise Prescription  Increase Physical Activity;Increase Strength and Stamina;Understanding of Exercise Prescription    Comments  Reviewed RPE scale, THR and program prescription with pt today.  Pt voiced understanding and was given a copy of goals to take home.   Reviewed home exercise with pt today.  Pt plans to walk at home for exercise.  He is going to start with one day a week.  Reviewed THR, pulse, RPE, sign and symptoms, and when to call 911 or MD.  Also discussed weather considerations and indoor options.  Pt voiced understanding.  Tramane is doing well in rehab.  He is doing well on 3 liters for exercise but he still gets SOB on teh treadmill.  We will continue to try to increase workloads and monitor his progress.   Hassaan continues to well in rehab.  He is steady on his workloads but everything is finally down to RPE of 11 so we will increase his workloads. We will continue to monitor his progression.   Willliam has been doing well in rehab.  He is now up to 2.3 mph on the treadmill.  We will continue to monitor his progression.    Expected Outcomes  Short: Use RPE daily to regulate intensity. Long: Follow program prescription in THR.  Short: Add in at least one extra day a week.  Long: Continue to exercise independently  Short: Increase workloads.  Long: Continue to improve strength and stamina.   Short: Increase workloads.  Long: Continue to increase physical activity.   Short: Continue to work on [place].  Long: Continue to build strength and stamina.    [place] Name 12/08/17 1406 12/23/17 1510 01/06/18 1451 01/20/18 1358 02/02/18 1630     Exercise Goal Re-Evaluation   Exercise Goals Review  Increase Physical Activity;Increase Strength and Stamina;Understanding of Exercise Prescription  Increase Physical Activity;Increase Strength and Stamina;Understanding of Exercise Prescription  Increase Physical Activity;Increase Strength and Stamina;Understanding of Exercise Prescription  Increase Physical Activity;Increase Strength and Stamina;Understanding of Exercise Prescription  Increase Physical Activity;Increase  Strength and Stamina;Understanding of Exercise Prescription   Comments  Hollis continues to do well in rehab.  He is up to level 6 on the XR.  We will continue to monitor his progress.   Steffan has only attended twice since last review due to transportation issues. Thus, he has not had much progress over the last two weeks.  We will continue to encourage better attendance and monitor his progress.   Ahmet has only attended three times since last visit, but he was supposed to be getting his truck fixed this week.  We will continue to encourage attendance and montior his progress.   Sylvester's attendance has gotten better over the last two weeks.  He has been maintaining his workloads and should be ready for an increase.  He is nearing graduation already.  We will continue to monitor his progress.   Nana will be graduating this week.  He improved his walk test by 15 ft.   He is planning to continue to exercise by walking on his own.    Expected Outcomes  Short: Increase T5 NuStep.  Long: Continue to improve stamina and possibly reduce oxygen use.   Short: Attend class regularly.  Long: Continue to increase physical activity.   Short: Return to class regularly.  Long: Continue to improve strength and stamina.  Short: Continue to attend regularly.  Long: Continue to improve strength and stamina.   Short: Graduation  Long: Continue to exercise independently      Nutrition & Weight - Outcomes: Pre Biometrics - 10/06/17 1424      Pre Biometrics   Height  5' 7.2" (1.707 m)    Weight  155 lb 14.4 oz (70.7 kg)    Waist Circumference  35 inches    Hip Circumference  37 inches    Waist to Hip Ratio  0.95 %    BMI (Calculated)  24.27      Post Biometrics - 01/23/18 1113       Post  Biometrics   Height  5' 7.2" (1.707 m)    Weight  150 lb (68 kg)    Waist Circumference  34 inches    Hip Circumference  35.25 inches    Waist to Hip Ratio  0.96 %    BMI (Calculated)  23.35       Nutrition: Nutrition  Therapy & Goals - 10/22/17 1238      Nutrition Therapy   Diet  TLC    Protein (specify units)  9oz    Fiber  30 grams   likely not meeting fiber goal   Whole Grain Foods  3 servings   does not usually choose whole grains   Saturated Fats  16 max. grams    Fruits and Vegetables  5 servings/day   8 ideal; does not eat fruit daily    Sodium  2000 grams   does not monitor sodium intake     Personal Nutrition Goals   Nutrition Goal  Start to pay attention to the sodium content in foods, particularly for pre-prepared meals and canned items. Choose foods with less than 326m of sodium    Personal Goal #2  Increase water consumption, start by swapping one soda for a glass of water per day    Personal Goal #3  Look in the frozen section for vegetables and beans, rather than choosing canned. They can be a cheap and quick-cook option that you can add as a side to any meal    Comments  He is looking to maintain is CBW. Eats several processed snack foods daily (usually in place of dinner), eats fried foods, does not monitor salt, and most meals consumed are ready-prepared/ frozen      Intervention Plan   Intervention  Prescribe, educate and counsel regarding individualized specific dietary modifications aiming towards targeted core components such as weight, hypertension, lipid management, diabetes, heart failure and other comorbidities.;Nutrition handout(s) given to patient.   nutrition guidelines for COPD handout   Expected Outcomes  Short Term Goal: Understand basic principles of dietary content, such as calories, fat, sodium, cholesterol and nutrients.;Short Term Goal: A plan has been developed with personal nutrition goals set during dietitian appointment.;Long Term Goal: Adherence to prescribed nutrition plan.       Nutrition Discharge:   Education Questionnaire Score: Knowledge Questionnaire Score - 01/19/18 1618      Knowledge Questionnaire Score   Pre Score  12/18    Post Score  15/18    reviewed with patient      Goals reviewed with patient; copy given to patient.

## 2018-02-02 NOTE — Progress Notes (Signed)
Daily Session Note  Patient Details  Name: Chris Case MRN: [id number] Date of Birth: February 27, 1971 Referring Provider:     Pulmonary Rehab from 10/06/2017 in [facility]  Referring Provider  Ancil Linsey MD      Encounter Date: 02/02/2018  Check In: Session Check In - 02/02/18 1221      Check-In   Supervising physician immediately available to respond to emergencies  [facility] immediately available ER MD    Physician(s)  Dr. Jimmye Norman and Joni Fears    Location  [facility] & [facility]    Staff Present  Earlean Shawl, BS, ACSM CEP, Exercise Physiologist;[facility], [place], ACSM CEP, Exercise Physiologist    Medication changes reported      No    Fall or balance concerns reported     No    Tobacco Cessation  No Change    Warm-up and Cool-down  Performed as group-led instruction    Resistance Training Performed  Yes    VAD Patient?  No    PAD/SET Patient?  No      Pain Assessment   Currently in Pain?  No/denies    Multiple Pain Sites  No          Social History   Tobacco Use  Smoking Status Former Smoker  . Packs/day: 3.00  . Years: 20.00  . Pack years: 60.00  . Types: Cigarettes  . Last attempt to quit: 04/11/2016  . Years since quitting: 1.8  Smokeless Tobacco Never Used    Goals Met:  Proper associated with RPD/PD & O2 Sat Independence with exercise equipment Exercise tolerated well No report of cardiac concerns or symptoms Strength training completed today  Goals Unmet:  Not Applicable  Comments: Pt able to follow exercise prescription today without complaint.  Will continue to monitor for progression.    Dr. Emily Filbert is Medical Director for [facility] and [facility].

## 2018-02-04 ENCOUNTER — Encounter: Payer: Medicaid Other | Admitting: *Deleted

## 2018-02-04 DIAGNOSIS — J449 Chronic obstructive pulmonary disease, unspecified: Secondary | ICD-10-CM

## 2018-02-04 NOTE — Progress Notes (Signed)
Discharge Progress Report  Patient Details  Name: Chris Case MRN: [id number] Date of Birth: 1970-10-07 Referring Provider:     Pulmonary Rehab from 10/06/2017 in [facility]  Referring Provider  Ancil Linsey MD       Number of Visits: 36  Reason for Discharge:  Patient reached a stable level of exercise. Patient independent in their exercise. Patient has met program and personal goals.  Smoking History:  Social History   Tobacco Use  Smoking Status Former Smoker  . Packs/day: 3.00  . Years: 20.00  . Pack years: 60.00  . Types: Cigarettes  . Last attempt to quit: 04/11/2016  . Years since quitting: 1.8  Smokeless Tobacco Never Used    Diagnosis:  Chronic obstructive pulmonary disease, unspecified COPD type ([place])  ADL UCSD: Pulmonary Assessment Scores    Row Name 10/06/17 1404 01/19/18 1617       ADL UCSD   ADL Phase  Entry  Exit    SOB Score total  47  43    Rest  4  1    Walk  3  3    Stairs  2  3    Bath  2  2    Dress  2  1    Shop  2  2      CAT Score   CAT Score  22  14      mMRC Score   mMRC Score  2  -       Initial Exercise Prescription: Initial Exercise Prescription - 10/06/17 1400      Date of Initial Exercise RX and Referring Provider   Date  10/06/17    Referring Provider  Ancil Linsey MD      Oxygen   Oxygen  Continuous    Liters  3      Treadmill   MPH  1.7    Grade  0    Minutes  15    METs  2.3      REL-XR   Level  3    Speed  50    Minutes  15    METs  2.3      T5 Nustep   Level  3    SPM  80    Minutes  15    METs  2.3      Prescription Details   Frequency (times per week)  3    Duration  Progress to 45 minutes of aerobic exercise without signs/symptoms of physical distress      Intensity   THRR 40-80% of Max Heartrate  116-154    Ratings of Perceived Exertion  11-13    Perceived Dyspnea  0-4      Progression   Progression  Continue to progress workloads to maintain intensity  without signs/symptoms of physical distress.      Resistance Training   Training Prescription  Yes    Weight  3 lbs    Reps  10-15       Discharge Exercise Prescription (Final Exercise Prescription Changes): Exercise Prescription Changes - 02/02/18 1600      Response to Exercise   Blood Pressure (Admit)  116/68    Blood Pressure (Exit)  122/70    Heart Rate (Admit)  91 bpm    Heart Rate (Exercise)  107 bpm    Heart Rate (Exit)  99 bpm    Oxygen Saturation (Admit)  91 %    Oxygen Saturation (Exercise)  95 %  Oxygen Saturation (Exit)  97 %    Rating of Perceived Exertion (Exercise)  13    Perceived Dyspnea (Exercise)  2    Symptoms  SOB    Duration  Continue with 45 min of aerobic exercise without signs/symptoms of physical distress.    Intensity  THRR unchanged      Progression   Progression  Continue to progress workloads to maintain intensity without signs/symptoms of physical distress.    Average METs  3.17      Resistance Training   Training Prescription  Yes    Weight  4 lbs    Reps  10-15      Interval Training   Interval Training  No      Oxygen   Oxygen  Continuous    Liters  2      Treadmill   MPH  2.3    Grade  0.5    Minutes  15    METs  2.92      REL-XR   Level  6    Minutes  15    METs  4.6      T5 Nustep   Level  4    Minutes  15    METs  2      Home Exercise Plan   Plans to continue exercise at  Home (comment)   walking   Frequency  Add 2 additional days to program exercise sessions.    Initial Home Exercises Provided  10/22/17       Functional Capacity: 6 Minute Walk    Row Name 10/06/17 1420 01/23/18 1114       6 Minute Walk   Phase  Initial  Discharge    Distance  920 feet  935 feet    Distance % Change  -  1.6 %    Distance Feet Change  -  15 ft    Walk Time  6 minutes  5.5 minutes    # of Rest Breaks  0  2    MPH  1.74  1.77    METS  3.75  4    RPE  11  11    Perceived Dyspnea   2  3    VO2 Peak  13.14  -     Symptoms  Yes (comment)  Yes (comment)    Comments  SOB  has had a cold and cant breathe well    Resting HR  78 bpm  91 bpm    Resting BP  130/60  112/62    Resting Oxygen Saturation   96 %  94 %    Exercise Oxygen Saturation  during 6 min walk  85 %  84 %    Max Ex. HR  102 bpm  110 bpm    Max Ex. BP  138/74  148/78    2 Minute Post BP  132/74  -      Interval HR   1 Minute HR  93  109    2 Minute HR  94  102    3 Minute HR  96  105    4 Minute HR  100  107    5 Minute HR  101  106    6 Minute HR  102  110    2 Minute Post HR  87  -    Interval Heart Rate?  Yes  Yes      Interval Oxygen   Interval Oxygen?  Yes  Yes    Baseline Oxygen Saturation %  96 %  94 %    1 Minute Oxygen Saturation %  92 %  87 %    1 Minute Liters of Oxygen  3 L  2 L    2 Minute Oxygen Saturation %  92 % 2:33 83%  86 %    2 Minute Liters of Oxygen  3 L  2 L    3 Minute Oxygen Saturation %  88 %  84 %    3 Minute Liters of Oxygen  3 L  2 L    4 Minute Oxygen Saturation %  86 %  84 %    4 Minute Liters of Oxygen  3 L  2 L    5 Minute Oxygen Saturation %  86 %  84 %    5 Minute Liters of Oxygen  3 L  2 L    6 Minute Oxygen Saturation %  86 % 6:05 85%  84 %    6 Minute Liters of Oxygen  3 L  2 L    2 Minute Post Oxygen Saturation %  94 %  89 %    2 Minute Post Liters of Oxygen  3 L  2 L       Psychological, QOL, Others - Outcomes: PHQ 2/9: Depression screen [facility] 2/9 01/19/2018 01/14/2018 10/06/2017  Decreased Interest 0 0 3  Down, Depressed, Hopeless 0 0 3  PHQ - 2 Score 0 0 6  Altered sleeping 0 0 0  Tired, decreased energy '1 1 2  ' Change in appetite 0 0 0  Feeling bad or failure about yourself  0 0 0  Trouble concentrating 0 0 0  Moving slowly or fidgety/restless 0 0 0  Suicidal thoughts 0 0 0  PHQ-9 Score '1 1 8  ' Difficult doing work/chores Somewhat difficult Somewhat difficult Somewhat difficult    Quality of Life:   Personal Goals: Goals established at orientation with interventions  provided to work toward goal. Personal Goals and Risk Factors at Admission - 10/06/17 1431      Core Components/Risk Factors/Patient Goals on Admission    Weight Management  Yes;Weight Maintenance    Intervention  Weight Management: Develop a combined nutrition and exercise program designed to reach desired caloric intake, while maintaining appropriate intake of nutrient and fiber, sodium and fats, and appropriate energy expenditure required for the weight goal.;Weight Management: Provide education and appropriate resources to help participant work on and attain dietary goals.;Weight Management/Obesity: Establish reasonable short term and long term weight goals.    Admit Weight  155 lb 14.4 oz (70.7 kg)    Goal Weight: Short Term  155 lb (70.3 kg)    Goal Weight: Long Term  155 lb (70.3 kg)    Expected Outcomes  Short Term: Continue to assess and modify interventions until short term weight is achieved;Long Term: Adherence to nutrition and physical activity/exercise program aimed toward attainment of established weight goal;Weight Maintenance: Understanding of the daily nutrition guidelines, which includes 25-35% calories from fat, 7% or less cal from saturated fats, less than 223m cholesterol, less than 1.5gm of sodium, & 5 or more servings of fruits and vegetables daily;Understanding recommendations for meals to include 15-35% energy as protein, 25-35% energy from fat, 35-60% energy from carbohydrates, less than 205mof dietary cholesterol, 20-35 gm of total fiber daily;Understanding of distribution of calorie intake throughout the day with the consumption of 4-5 meals/snacks    Improve shortness of breath  with ADL's  Yes    Intervention  Provide education, individualized exercise plan and daily activity instruction to help decrease symptoms of SOB with activities of daily living.    Expected Outcomes  Long Term: Be able to perform more ADLs without symptoms or delay the onset of symptoms;Short Term:  Improve cardiorespiratory fitness to achieve a reduction of symptoms when performing ADLs        Personal Goals Discharge: Goals and Risk Factor Review    Row Name 11/07/17 1153 12/17/17 1219 01/14/18 1233         Core Components/Risk Factors/Patient Goals Review   Personal Goals Review  Weight Management/Obesity;Improve shortness of breath with ADL's  Weight Management/Obesity;Improve shortness of breath with ADL's  Weight Management/Obesity;Improve shortness of breath with ADL's;Other     Review  Patient has been doing well with his weight. He gets short of breath when he eats. When he lays down and gets up he gets short of breath. Grocery shopping is hard for him if he takes it slow. He  has his oxygen in his truck and takes his oxygen with him at all times.   His breathing has been better. Marcos has more of an understanding about his illness. Patient is using 1 liter of oxygen while exercising and hopes to get off of oxygen.  Crystal has been out due to his truck not running. He has since had it fixed and will attend [facility] regularly. Shortness of breath has been worse in the morning. When he gets up and starts moving he feels better. He is maintaining his weight at 150lbs.      Expected Outcomes  Short: attened [facility] to improve ADLs. Long: Maintain ADLs independently while checking oxygen.  Short: decrease oxygen to room air. Long: stay off oxygen while maintaing ADL.  Short: attend [facility] regularly now that his truck is fixed. Long: graduate [employer].        Exercise Goals and Review: Exercise Goals    Row Name 10/06/17 1424             Exercise Goals   Increase Physical Activity  Yes       Intervention  Provide advice, education, support and counseling about physical activity/exercise needs.;Develop an individualized exercise prescription for aerobic and resistive training based on initial evaluation findings, risk stratification, comorbidities and participant's personal  goals.       Expected Outcomes  Short Term: Attend rehab on a regular basis to increase amount of physical activity.;Long Term: Add in home exercise to make exercise part of routine and to increase amount of physical activity.;Long Term: Exercising regularly at least 3-5 days a week.       Increase Strength and Stamina  Yes       Intervention  Provide advice, education, support and counseling about physical activity/exercise needs.;Develop an individualized exercise prescription for aerobic and resistive training based on initial evaluation findings, risk stratification, comorbidities and participant's personal goals.       Expected Outcomes  Short Term: Increase workloads from initial exercise prescription for resistance, speed, and METs.;Short Term: Perform resistance training exercises routinely during rehab and add in resistance training at home;Long Term: Improve cardiorespiratory fitness, muscular endurance and strength as measured by increased METs and functional capacity (6MWT)       Able to understand and use rate of perceived exertion (RPE) scale  Yes       Intervention  Provide education and explanation on how to use RPE scale  Expected Outcomes  Short Term: Able to use RPE daily in rehab to express subjective intensity level;Long Term:  Able to use RPE to guide intensity level when exercising independently       Able to understand and use Dyspnea scale  Yes       Intervention  Provide education and explanation on how to use Dyspnea scale       Expected Outcomes  Short Term: Able to use Dyspnea scale daily in rehab to express subjective sense of shortness of breath during exertion;Long Term: Able to use Dyspnea scale to guide intensity level when exercising independently       Knowledge and understanding of Target Heart Rate Range (THRR)  Yes       Intervention  Provide education and explanation of THRR including how the numbers were predicted and where they are located for reference        Expected Outcomes  Short Term: Able to state/look up THRR;Short Term: Able to use daily as guideline for intensity in rehab;Long Term: Able to use THRR to govern intensity when exercising independently       Able to check pulse independently  Yes       Intervention  Provide education and demonstration on how to check pulse in carotid and radial arteries.;Review the importance of being able to check your own pulse for safety during independent exercise       Expected Outcomes  Short Term: Able to explain why pulse checking is important during independent exercise;Long Term: Able to check pulse independently and accurately       Understanding of Exercise Prescription  Yes       Intervention  Provide education, explanation, and written materials on patient's individual exercise prescription       Expected Outcomes  Short Term: Able to explain program exercise prescription;Long Term: Able to explain home exercise prescription to exercise independently          Exercise Goals Re-Evaluation: Exercise Goals Re-Evaluation    Row Name 10/13/17 1508 10/22/17 1233 10/27/17 1452 11/10/17 1352 11/26/17 1432     Exercise Goal Re-Evaluation   Exercise Goals Review  Increase Physical Activity;Increase Strength and Stamina;Able to understand and use rate of perceived exertion (RPE) scale;Able to understand and use Dyspnea scale  Increase Physical Activity;Increase Strength and Stamina;Understanding of Exercise Prescription  Increase Physical Activity;Increase Strength and Stamina;Understanding of Exercise Prescription  Increase Physical Activity;Increase Strength and Stamina;Understanding of Exercise Prescription  Increase Physical Activity;Increase Strength and Stamina;Understanding of Exercise Prescription   Comments  Reviewed RPE scale, THR and program prescription with pt today.  Pt voiced understanding and was given a copy of goals to take home.   Reviewed home exercise with pt today.  Pt plans to walk at  home for exercise.  He is going to start with one day a week.  Reviewed THR, pulse, RPE, sign and symptoms, and when to call 911 or MD.  Also discussed weather considerations and indoor options.  Pt voiced understanding.  Krishawn is doing well in rehab.  He is doing well on 3 liters for exercise but he still gets SOB on teh treadmill.  We will continue to try to increase workloads and monitor his progress.   Jaron continues to well in rehab.  He is steady on his workloads but everything is finally down to RPE of 11 so we will increase his workloads. We will continue to monitor his progression.   Vonnie has been doing well in rehab.  He is now up to 2.3 mph on the treadmill.  We will continue to monitor his progression.    Expected Outcomes  Short: Use RPE daily to regulate intensity. Long: Follow program prescription in THR.  Short: Add in at least one extra day a week.  Long: Continue to exercise independently  Short: Increase workloads.  Long: Continue to improve strength and stamina.   Short: Increase workloads.  Long: Continue to increase physical activity.   Short: Continue to work on [place].  Long: Continue to build strength and stamina.    [place] Name 12/08/17 1406 12/23/17 1510 01/06/18 1451 01/20/18 1358 02/02/18 1630     Exercise Goal Re-Evaluation   Exercise Goals Review  Increase Physical Activity;Increase Strength and Stamina;Understanding of Exercise Prescription  Increase Physical Activity;Increase Strength and Stamina;Understanding of Exercise Prescription  Increase Physical Activity;Increase Strength and Stamina;Understanding of Exercise Prescription  Increase Physical Activity;Increase Strength and Stamina;Understanding of Exercise Prescription  Increase Physical Activity;Increase Strength and Stamina;Understanding of Exercise Prescription   Comments  Moyses continues to do well in rehab.  He is up to level 6 on the XR.  We will continue to monitor his progress.   Sederick has only  attended twice since last review due to transportation issues. Thus, he has not had much progress over the last two weeks.  We will continue to encourage better attendance and monitor his progress.   Joey has only attended three times since last visit, but he was supposed to be getting his truck fixed this week.  We will continue to encourage attendance and montior his progress.   Burt's attendance has gotten better over the last two weeks.  He has been maintaining his workloads and should be ready for an increase.  He is nearing graduation already.  We will continue to monitor his progress.   Rushi will be graduating this week.  He improved his walk test by 15 ft.   He is planning to continue to exercise by walking on his own.    Expected Outcomes  Short: Increase T5 NuStep.  Long: Continue to improve stamina and possibly reduce oxygen use.   Short: Attend class regularly.  Long: Continue to increase physical activity.   Short: Return to class regularly.  Long: Continue to improve strength and stamina.   Short: Continue to attend regularly.  Long: Continue to improve strength and stamina.   Short: Graduation  Long: Continue to exercise independently      Nutrition & Weight - Outcomes: Pre Biometrics - 10/06/17 1424      Pre Biometrics   Height  5' 7.2" (1.707 m)    Weight  155 lb 14.4 oz (70.7 kg)    Waist Circumference  35 inches    Hip Circumference  37 inches    Waist to Hip Ratio  0.95 %    BMI (Calculated)  24.27      Post Biometrics - 01/23/18 1113       Post  Biometrics   Height  5' 7.2" (1.707 m)    Weight  150 lb (68 kg)    Waist Circumference  34 inches    Hip Circumference  35.25 inches    Waist to Hip Ratio  0.96 %    BMI (Calculated)  23.35       Nutrition: Nutrition Therapy & Goals - 10/22/17 1238      Nutrition Therapy   Diet  TLC    Protein (specify units)  9oz    Fiber  30 grams   likely not meeting fiber goal   Whole Grain Foods  3 servings   does not  usually choose whole grains   Saturated Fats  16 max. grams    Fruits and Vegetables  5 servings/day   8 ideal; does not eat fruit daily    Sodium  2000 grams   does not monitor sodium intake     Personal Nutrition Goals   Nutrition Goal  Start to pay attention to the sodium content in foods, particularly for pre-prepared meals and canned items. Choose foods with less than 382m of sodium    Personal Goal #2  Increase water consumption, start by swapping one soda for a glass of water per day    Personal Goal #3  Look in the frozen section for vegetables and beans, rather than choosing canned. They can be a cheap and quick-cook option that you can add as a side to any meal    Comments  He is looking to maintain is CBW. Eats several processed snack foods daily (usually in place of dinner), eats fried foods, does not monitor salt, and most meals consumed are ready-prepared/ frozen      Intervention Plan   Intervention  Prescribe, educate and counsel regarding individualized specific dietary modifications aiming towards targeted core components such as weight, hypertension, lipid management, diabetes, heart failure and other comorbidities.;Nutrition handout(s) given to patient.   nutrition guidelines for COPD handout   Expected Outcomes  Short Term Goal: Understand basic principles of dietary content, such as calories, fat, sodium, cholesterol and nutrients.;Short Term Goal: A plan has been developed with personal nutrition goals set during dietitian appointment.;Long Term Goal: Adherence to prescribed nutrition plan.       Nutrition Discharge:   Education Questionnaire Score: Knowledge Questionnaire Score - 01/19/18 1618      Knowledge Questionnaire Score   Pre Score  12/18    Post Score  15/18   reviewed with patient      Goals reviewed with patient; copy given to patient.

## 2018-02-04 NOTE — Progress Notes (Signed)
Daily Session Note  Patient Details  Name: Chris Case MRN: [id number] Date of Birth: [birth year] Referring Provider:     Pulmonary Rehab from 10/06/2017 in [facility]  Referring Provider  Ancil Linsey MD      Encounter Date: 02/04/2018  Check In: Session Check In - 02/04/18 1129      Check-In   Supervising physician immediately available to respond to emergencies  [facility] immediately available ER MD    Physician(s)  Dr. Quentin Cornwall and Jimmye Norman    Location  [facility]-Cardiac & Pulmonary Rehab    Staff Present  Alberteen Sam, MA, RCEP, CCRP, Exercise Physiologist;Latangela Mccomas Sherryll Burger, RN BSN;Joseph Hood RCP,RRT,BSRT    Medication changes reported      No    Fall or balance concerns reported     No    Tobacco Cessation  No Change    Warm-up and Cool-down  Performed as group-led instruction    Resistance Training Performed  Yes    VAD Patient?  No    PAD/SET Patient?  No      Pain Assessment   Currently in Pain?  No/denies          Social History   Tobacco Use  Smoking Status Former Smoker  . Packs/day: 3.00  . Years: 20.00  . Pack years: 60.00  . Types: Cigarettes  . Last attempt to quit: 04/11/2016  . Years since quitting: 1.8  Smokeless Tobacco Never Used    Goals Met:  Proper associated with RPD/PD & O2 Sat Independence with exercise equipment Using PLB without cueing & demonstrates good technique Exercise tolerated well No report of cardiac concerns or symptoms Strength training completed today  Goals Unmet:  Not Applicable  Comments:  Fadel graduated today from  rehab with 36 sessions completed.  Details of the patient's exercise prescription and what He needs to do in order to continue the prescription and progress were discussed with patient.  Patient was given a copy of prescription and goals.  Patient verbalized understanding.  Wise plans to continue to exercise by walking at home.    Dr. Emily Filbert is Medical Director for  [facility] and [facility].

## 2018-02-04 NOTE — Progress Notes (Signed)
Pulmonary Individual Treatment Plan  Patient Details  Name: Chris Case MRN: [id number] Date of Birth: October 08, 1970 Referring Provider:     Pulmonary Rehab from 10/06/2017 in [facility] Re[facility]  Referring Provider  Ancil Linsey MD      Initial Encounter Date:    Pulmonary Rehab from 10/06/2017 in [facility] Cardiac and Pulmonary Rehab  Date  10/06/17      Visit Diagnosis: Chronic obstructive pulmonary disease, unspecified COPD type ([place])  [name] Home Medications on Admission:  Current Outpatient Medications:  .  albuterol (PROVENTIL) (2.5 MG/3ML) 0.083% nebulizer solution, Inhale 3 mLs into the lungs every 4 (four) hours as needed for wheezing or shortness of breath., Disp: 75 mL, Rfl: 12 .  aspirin EC 81 MG EC tablet, Take 1 tablet (81 mg total) by mouth daily., Disp: 30 tablet, Rfl: 6 .  furosemide (LASIX) 20 MG tablet, Take 20 mg by mouth daily., Disp: , Rfl: 0 .  mometasone-formoterol (DULERA) 200-5 MCG/ACT AERO, Inhale 2 puffs into the lungs 2 (two) times daily., Disp: 1 Inhaler, Rfl: 5 .  OXYGEN, Inhale 2 L into the lungs., Disp: , Rfl:   Past Medical History: Past Medical History:  Diagnosis Date  . COPD (chronic obstructive pulmonary disease) ([place])   . Emphysema lung ([place])   . 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 Packs/day: 3.00  . Years: 20.00  . Pack years: 60.00  . Types: Cigarettes  . Last attempt to quit: 04/11/2016  . Years since quitting: 1.8  Smokeless Tobacco Never Used    Labs: Recent Review Flowsheet Data    There is no flowsheet data to display.       Pulmonary Assessment Scores: Pulmonary Assessment Scores    Row Name 10/06/17 1404 01/19/18 1617       ADL UCSD   ADL Phase  Entry  Exit    SOB Score total  47  43    Rest  4  1    Walk  3  3    Stairs  2  3    Bath  2  2    Dress  2  1    Shop  2  2      CAT Score   CAT  Score  22  14      mMRC Score   mMRC Score  2  -       Pulmonary Function Assessment: Pulmonary Function Assessment - 10/06/17 1408      Initial Spirometry Results   FVC%  30 %   Test done on 06/19/16 Care Everywhere   FEV1%  17 %    FEV1/FVC Ratio  46      Post Bronchodilator Spirometry Results   FVC%  35 %   Test done on 06/19/16 Care Everywhere   FEV1%  20 %    FEV1/FVC Ratio  46      Breath   Bilateral Breath Sounds  Decreased    Shortness of Breath  Yes;Panic with Shortness of Breath;Limiting activity       Exercise Target Goals: Exercise Program Goal: Individual exercise prescription set using results from initial 6 min walk test and THRR while considering  patient's activity barriers and safety.   Exercise Prescription Goal: Initial exercise prescription builds to 30-45 minutes a day of aerobic activity, 2-3 days per week.  Home  exercise guidelines will be given to patient during program as part of exercise prescription that the participant will acknowledge.  Activity Barriers & Risk Stratification: Activity Barriers & Cardiac Risk Stratification - 10/06/17 1422      Activity Barriers & Cardiac Risk Stratification   Activity Barriers  Deconditioning;Shortness of Breath;Muscular Weakness       6 Minute Walk: 6 Minute Walk    Row Name 10/06/17 1420 01/23/18 1114       6 Minute Walk   Phase  Initial  Discharge    Distance  920 feet  935 feet    Distance % Change  -  1.6 %    Distance Feet Change  -  15 ft    Walk Time  6 minutes  5.5 minutes    # of Rest Breaks  0  2    MPH  1.74  1.77    METS  3.75  4    RPE  11  11    Perceived Dyspnea   2  3    VO2 Peak  13.14  -    Symptoms  Yes (comment)  Yes (comment)    Comments  SOB  has had a cold and cant breathe well    Resting HR  78 bpm  91 bpm    Resting BP  130/60  112/62    Resting Oxygen Saturation   96 %  94 %    Exercise Oxygen Saturation  during 6 min walk  85 %  84 %    Max Ex. HR  102 bpm  110  bpm    Max Ex. BP  138/74  148/78    2 Minute Post BP  132/74  -      Interval HR   1 Minute HR  93  109    2 Minute HR  94  102    3 Minute HR  96  105    4 Minute HR  100  107    5 Minute HR  101  106    6 Minute HR  102  110    2 Minute Post HR  87  -    Interval Heart Rate?  Yes  Yes      Interval Oxygen   Interval Oxygen?  Yes  Yes    Baseline Oxygen Saturation %  96 %  94 %    1 Minute Oxygen Saturation %  92 %  87 %    1 Minute Liters of Oxygen  3 L  2 L    2 Minute Oxygen Saturation %  92 % 2:33 83%  86 %    2 Minute Liters of Oxygen  3 L  2 L    3 Minute Oxygen Saturation %  88 %  84 %    3 Minute Liters of Oxygen  3 L  2 L    4 Minute Oxygen Saturation %  86 %  84 %    4 Minute Liters of Oxygen  3 L  2 L    5 Minute Oxygen Saturation %  86 %  84 %    5 Minute Liters of Oxygen  3 L  2 L    6 Minute Oxygen Saturation %  86 % 6:05 85%  84 %    6 Minute Liters of Oxygen  3 L  2 L    2 Minute Post Oxygen Saturation %  94 %  89 %  2 Minute Post Liters of Oxygen  3 L  2 L      Oxygen Initial Assessment: Oxygen Initial Assessment - 10/06/17 1430      Home Oxygen   Home Oxygen Device  Home Concentrator;E-Tanks    Sleep Oxygen Prescription  Continuous    Liters per minute  2    Home Exercise Oxygen Prescription  Continuous    Liters per minute  3    Home at Rest Exercise Oxygen Prescription  Continuous    Liters per minute  3    Compliance with Home Oxygen Use  Yes      Initial 6 min Walk   Oxygen Used  Continuous    Liters per minute  3      Program Oxygen Prescription   Program Oxygen Prescription  Continuous    Liters per minute  3      Intervention   Short Term Goals  To learn and exhibit compliance with exercise, home and travel O2 prescription;To learn and understand importance of monitoring SPO2 with pulse oximeter and demonstrate accurate use of the pulse oximeter.;To learn and understand importance of maintaining oxygen saturations>88%;To learn and  demonstrate proper pursed lip breathing techniques or other breathing techniques.;To learn and demonstrate proper use of respiratory medications    Long  Term Goals  Exhibits compliance with exercise, home and travel O2 prescription;Verbalizes importance of monitoring SPO2 with pulse oximeter and return demonstration;Maintenance of O2 saturations>88%;Exhibits proper breathing techniques, such as pursed lip breathing or other method taught during program session;Compliance with respiratory medication;Demonstrates proper use of MDI's       Oxygen Re-Evaluation: Oxygen Re-Evaluation    Row Name 10/13/17 1605 11/07/17 1143 12/03/17 1538 12/31/17 1350       Program Oxygen Prescription   Program Oxygen Prescription  -  Continuous  Continuous  Continuous    Liters per minute  -  '3  2  2    ' Comments  -  -  patient has been in the high 90's while exercising  -      Home Oxygen   Home Oxygen Device  -  Home Concentrator;E-Tanks  Home Concentrator;E-Tanks  Home Concentrator;E-Tanks    Sleep Oxygen Prescription  -  Continuous  Continuous  Continuous    Liters per minute  -  '2  2  2    ' Home Exercise Oxygen Prescription  -  Continuous  Continuous  Continuous    Liters per minute  -  '3  3  3    ' Home at Rest Exercise Oxygen Prescription  -  Continuous  Continuous  Continuous    Liters per minute  -  '3  3  3    ' Compliance with Home Oxygen Use  -  Yes  Yes  Yes      Goals/Expected Outcomes   Short Term Goals  To learn and understand importance of monitoring SPO2 with pulse oximeter and demonstrate accurate use of the pulse oximeter.;To learn and understand importance of maintaining oxygen saturations>88%;To learn and demonstrate proper pursed lip breathing techniques or other breathing techniques.  To learn and understand importance of monitoring SPO2 with pulse oximeter and demonstrate accurate use of the pulse oximeter.;To learn and understand importance of maintaining oxygen saturations>88%;To learn  and demonstrate proper pursed lip breathing techniques or other breathing techniques.;To learn and demonstrate proper use of respiratory medications;To learn and exhibit compliance with exercise, home and travel O2 prescription  To learn and understand importance of monitoring SPO2 with  pulse oximeter and demonstrate accurate use of the pulse oximeter.;To learn and understand importance of maintaining oxygen saturations>88%;To learn and demonstrate proper pursed lip breathing techniques or other breathing techniques.;To learn and demonstrate proper use of respiratory medications;To learn and exhibit compliance with exercise, home and travel O2 prescription  To learn and understand importance of monitoring SPO2 with pulse oximeter and demonstrate accurate use of the pulse oximeter.;To learn and understand importance of maintaining oxygen saturations>88%;To learn and demonstrate proper pursed lip breathing techniques or other breathing techniques.;To learn and demonstrate proper use of respiratory medications;To learn and exhibit compliance with exercise, home and travel O2 prescription    Long  Term Goals  Verbalizes importance of monitoring SPO2 with pulse oximeter and return demonstration;Maintenance of O2 saturations>88%;Exhibits proper breathing techniques, such as pursed lip breathing or other method taught during program session  Verbalizes importance of monitoring SPO2 with pulse oximeter and return demonstration;Maintenance of O2 saturations>88%;Exhibits proper breathing techniques, such as pursed lip breathing or other method taught during program session;Compliance with respiratory medication;Demonstrates proper use of MDI's;Exhibits compliance with exercise, home and travel O2 prescription  Verbalizes importance of monitoring SPO2 with pulse oximeter and return demonstration;Maintenance of O2 saturations>88%;Exhibits proper breathing techniques, such as pursed lip breathing or other method taught  during program session;Compliance with respiratory medication;Demonstrates proper use of MDI's;Exhibits compliance with exercise, home and travel O2 prescription  Verbalizes importance of monitoring SPO2 with pulse oximeter and return demonstration;Maintenance of O2 saturations>88%;Exhibits proper breathing techniques, such as pursed lip breathing or other method taught during program session;Compliance with respiratory medication;Demonstrates proper use of MDI's;Exhibits compliance with exercise, home and travel O2 prescription    Comments  Reviewed PLB technique with pt.  Talked about how it work and it's important to maintaining his exercise saturations.    Patient is taking his Albuterol nebulizer every 4 hours and as needed. He also takes advair and spiriva. Informed patient to take his medications in the proper order to better benifit his breathing. He should take is albuterol nebulizer or albuterol MDI before he takes his other inhalers.  Patient has been doing well in the class and has maintain oxygen saturations in the high 90's while exercising on 2 liters. Patient wants to come off oxygen if he can. We will continue to titrate oxygen if oxygen saturations can be maintained above 88 pecent while exercising.  Practiced PLB with patient. Patient understands why PLB is important and to use it when he is short of breath. Reviewed that oxygen saturations should be 88 percent and above. Patient has a pulse oximeter at home to check his oxygen. Muhannad has been doing well with 1 liter of oxygen on the sit down exercises but uses 2 liters when on the treadmill.    Goals/Expected Outcomes  Short: Become more profiecient at using PLB.   Long: Become independent at using PLB.  Short: use medication as prescribed. Long: Take medications independently and efficiently  Short: decrease oxygen to 1 liter for exercise. Long: maintain oxygen saturation on Room air while exercising.  Short: use PLB with exertion. Long:  use PLB on exertion proficiently and independently.       Oxygen Discharge (Final Oxygen Re-Evaluation): Oxygen Re-Evaluation - 12/31/17 1350      Program Oxygen Prescription   Program Oxygen Prescription  Continuous    Liters per minute  2      Home Oxygen   Home Oxygen Device  Home Concentrator;E-Tanks    Sleep Oxygen Prescription  Continuous  Liters per minute  2    Home Exercise Oxygen Prescription  Continuous    Liters per minute  3    Home at Rest Exercise Oxygen Prescription  Continuous    Liters per minute  3    Compliance with Home Oxygen Use  Yes      Goals/Expected Outcomes   Short Term Goals  To learn and understand importance of monitoring SPO2 with pulse oximeter and demonstrate accurate use of the pulse oximeter.;To learn and understand importance of maintaining oxygen saturations>88%;To learn and demonstrate proper pursed lip breathing techniques or other breathing techniques.;To learn and demonstrate proper use of respiratory medications;To learn and exhibit compliance with exercise, home and travel O2 prescription    Long  Term Goals  Verbalizes importance of monitoring SPO2 with pulse oximeter and return demonstration;Maintenance of O2 saturations>88%;Exhibits proper breathing techniques, such as pursed lip breathing or other method taught during program session;Compliance with respiratory medication;Demonstrates proper use of MDI's;Exhibits compliance with exercise, home and travel O2 prescription    Comments  Practiced PLB with patient. Patient understands why PLB is important and to use it when he is short of breath. Reviewed that oxygen saturations should be 88 percent and above. Patient has a pulse oximeter at home to check his oxygen. Haddon has been doing well with 1 liter of oxygen on the sit down exercises but uses 2 liters when on the treadmill.    Goals/Expected Outcomes  Short: use PLB with exertion. Long: use PLB on exertion proficiently and independently.        Initial Exercise Prescription: Initial Exercise Prescription - 10/06/17 1400      Date of Initial Exercise RX and Referring Provider   Date  10/06/17    Referring Provider  Ancil Linsey MD      Oxygen   Oxygen  Continuous    Liters  3      Treadmill   MPH  1.7    Grade  0    Minutes  15    METs  2.3      REL-XR   Level  3    Speed  50    Minutes  15    METs  2.3      T5 Nustep   Level  3    SPM  80    Minutes  15    METs  2.3      Prescription Details   Frequency (times per week)  3    Duration  Progress to 45 minutes of aerobic exercise without signs/symptoms of physical distress      Intensity   THRR 40-80% of Max Heartrate  116-154    Ratings of Perceived Exertion  11-13    Perceived Dyspnea  0-4      Progression   Progression  Continue to progress workloads to maintain intensity without signs/symptoms of physical distress.      Resistance Training   Training Prescription  Yes    Weight  3 lbs    Reps  10-15       Perform Capillary Blood Glucose checks as needed.  Exercise Prescription Changes: Exercise Prescription Changes    Row Name 10/06/17 1400 10/14/17 1300 10/22/17 1200 10/27/17 1400 11/10/17 1400     Response to Exercise   Blood Pressure (Admit)  130/60  120/74  -  128/70  120/64   Blood Pressure (Exercise)  138/74  138/82  -  -  -   Blood Pressure (Exit)  132/74  120/68  -  110/68  126/62   Heart Rate (Admit)  78 bpm  76 bpm  -  81 bpm  88 bpm   Heart Rate (Exercise)  102 bpm  105 bpm  -  102 bpm  98 bpm   Heart Rate (Exit)  87 bpm  90 bpm  -  87 bpm  88 bpm   Oxygen Saturation (Admit)  96 %  92 %  -  91 %  95 %   Oxygen Saturation (Exercise)  85 %  92 %  -  94 %  91 %   Oxygen Saturation (Exit)  94 %  99 %  -  98 %  98 %   Rating of Perceived Exertion (Exercise)  11  13  -  13  11   Perceived Dyspnea (Exercise)  2  3  -  2  1   Symptoms  SOB  SOB  -  SOB  SOB   Comments  walk test results  first full day of exercise  -  -  -    Duration  -  Progress to 45 minutes of aerobic exercise without signs/symptoms of physical distress  -  Continue with 45 min of aerobic exercise without signs/symptoms of physical distress.  Continue with 45 min of aerobic exercise without signs/symptoms of physical distress.   Intensity  -  THRR unchanged  -  THRR unchanged  THRR unchanged     Progression   Progression  -  Continue to progress workloads to maintain intensity without signs/symptoms of physical distress.  -  Continue to progress workloads to maintain intensity without signs/symptoms of physical distress.  Continue to progress workloads to maintain intensity without signs/symptoms of physical distress.   Average METs  -  2.9  -  2.67  2.7     Resistance Training   Training Prescription  -  Yes  -  Yes  Yes   Weight  -  3 lbs  -  4 lbs  4 lbs   Reps  -  10-15  -  10-15  10-15     Interval Training   Interval Training  -  No  -  No  No     Oxygen   Oxygen  -  Continuous  -  Continuous  Continuous   Liters  -  3  -  3  3     Treadmill   MPH  -  1.7  -  1.7  1.7   Grade  -  0  -  0  0   Minutes  -  15  -  15  15   METs  -  2.3  -  2.3  2.3     REL-XR   Level  -  3  -  3  3   Minutes  -  15  -  15  15   METs  -  3.5  -  3.7  3.8     T5 Nustep   Level  -  3  -  3  3   Minutes  -  15  -  15  15   METs  -  -  -  2  2     Home Exercise Plan   Plans to continue exercise at  -  -  Home (comment) walking  Home (comment) walking  Home (comment) walking   Frequency  -  -  Add 1 additional  day to program exercise sessions.  Add 1 additional day to program exercise sessions.  Add 1 additional day to program exercise sessions.   Initial Home Exercises Provided  -  -  10/22/17  10/22/17  10/22/17   Row Name 11/26/17 1400 12/08/17 1400 12/23/17 1500 01/06/18 1500 01/20/18 1400     Response to Exercise   Blood Pressure (Admit)  128/64  110/60  100/64  114/68  110/62   Blood Pressure (Exit)  112/56  106/58  116/68  122/64   122/70   Heart Rate (Admit)  93 bpm  88 bpm  95 bpm  90 bpm  98 bpm   Heart Rate (Exercise)  116 bpm  101 bpm  106 bpm  102 bpm  114 bpm   Heart Rate (Exit)  90 bpm  82 bpm  98 bpm  98 bpm  91 bpm   Oxygen Saturation (Admit)  93 %  91 %  94 %  89 %  87 %   Oxygen Saturation (Exercise)  93 %  94 %  96 %  97 %  94 %   Oxygen Saturation (Exit)  97 %  98 %  90 %  87 %  99 %   Rating of Perceived Exertion (Exercise)  '12  11  15  14  12   ' Perceived Dyspnea (Exercise)  '2  1  3  3  3   ' Symptoms  SOB  SOB  SOB  SOB  SOB   Duration  Continue with 45 min of aerobic exercise without signs/symptoms of physical distress.  Continue with 45 min of aerobic exercise without signs/symptoms of physical distress.  Continue with 45 min of aerobic exercise without signs/symptoms of physical distress.  Continue with 45 min of aerobic exercise without signs/symptoms of physical distress.  Continue with 45 min of aerobic exercise without signs/symptoms of physical distress.   Intensity  THRR unchanged  THRR unchanged  THRR unchanged  THRR unchanged  THRR unchanged     Progression   Progression  Continue to progress workloads to maintain intensity without signs/symptoms of physical distress.  Continue to progress workloads to maintain intensity without signs/symptoms of physical distress.  Continue to progress workloads to maintain intensity without signs/symptoms of physical distress.  Continue to progress workloads to maintain intensity without signs/symptoms of physical distress.  Continue to progress workloads to maintain intensity without signs/symptoms of physical distress.   Average METs  2.97  3.14  2.91  3.14  3.01     Resistance Training   Training Prescription  Yes  Yes  Yes  Yes  Yes   Weight  4 lbs  4 lbs  4 lbs  4 lbs  4 lbs   Reps  10-15  10-15  10-15  10-15  10-15     Interval Training   Interval Training  No  No  No  No  No     Oxygen   Oxygen  Continuous  Continuous  Continuous  Continuous   Continuous   Liters  '3  2  1  1  2     ' Treadmill   MPH  2.3  2.3  2.3  2.3  2.3   Grade  0.5  0.5  0.5  0.5  0.5   Minutes  '15  15  15  15  15   ' METs  2.92  2.92  2.92  2.92  2.92     REL-XR   Level  4  '6  6  6  6   ' Minutes  '15  15  15  15  15   ' METs  4.1  4.5  3.9  3.8  4.1     T5 Nustep   Level  '3  3  3  4  4   ' Minutes  '15  15  15  15  15   ' METs  2  2  1.9  2.7  2     Home Exercise Plan   Plans to continue exercise at  Home (comment) walking  Home (comment) walking  Home (comment) walking  Home (comment) walking  Home (comment) walking   Frequency  Add 1 additional day to program exercise sessions.  Add 2 additional days to program exercise sessions.  Add 2 additional days to program exercise sessions.  Add 2 additional days to program exercise sessions.  Add 2 additional days to program exercise sessions.   Initial Home Exercises Provided  10/22/17  10/22/17  10/22/17  10/22/17  10/22/17   Row Name 02/02/18 1600             Response to Exercise   Blood Pressure (Admit)  116/68       Blood Pressure (Exit)  122/70       Heart Rate (Admit)  91 bpm       Heart Rate (Exercise)  107 bpm       Heart Rate (Exit)  99 bpm       Oxygen Saturation (Admit)  91 %       Oxygen Saturation (Exercise)  95 %       Oxygen Saturation (Exit)  97 %       Rating of Perceived Exertion (Exercise)  13       Perceived Dyspnea (Exercise)  2       Symptoms  SOB       Duration  Continue with 45 min of aerobic exercise without signs/symptoms of physical distress.       Intensity  THRR unchanged         Progression   Progression  Continue to progress workloads to maintain intensity without signs/symptoms of physical distress.       Average METs  3.17         Resistance Training   Training Prescription  Yes       Weight  4 lbs       Reps  10-15         Interval Training   Interval Training  No         Oxygen   Oxygen  Continuous       Liters  2         Treadmill   MPH  2.3       Grade   0.5       Minutes  15       METs  2.92         REL-XR   Level  6       Minutes  15       METs  4.6         T5 Nustep   Level  4       Minutes  15       METs  2         Home Exercise Plan   Plans to continue exercise at  Home (comment) walking       Frequency  Add 2 additional days  to program exercise sessions.       Initial Home Exercises Provided  10/22/17          Exercise Comments: Exercise Comments    Row Name 10/13/17 1508 02/04/18 1131         Exercise Comments  First full day of exercise!  Patient was oriented to gym and equipment including functions, settings, policies, and procedures.  [name] individual exercise prescription and treatment plan were reviewed.  All starting workloads were established based on the results of the 6 minute walk test done at initial orientation visit.  The plan for exercise progression was also introduced and progression will be customized based on patient's performance and goals.  Cleve graduated today from  rehab with 36 sessions completed.  Details of the patient's exercise prescription and what He needs to do in order to continue the prescription and progress were discussed with patient.  Patient was given a copy of prescription and goals.  Patient verbalized understanding.  Hearl plans to continue to exercise by walking at home.         Exercise Goals and Review: Exercise Goals    Row Name 10/06/17 1424             Exercise Goals   Increase Physical Activity  Yes       Intervention  Provide advice, education, support and counseling about physical activity/exercise needs.;Develop an individualized exercise prescription for aerobic and resistive training based on initial evaluation findings, risk stratification, comorbidities and participant's personal goals.       Expected Outcomes  Short Term: Attend rehab on a regular basis to increase amount of physical activity.;Long Term: Add in home exercise to make exercise part of routine  and to increase amount of physical activity.;Long Term: Exercising regularly at least 3-5 days a week.       Increase Strength and Stamina  Yes       Intervention  Provide advice, education, support and counseling about physical activity/exercise needs.;Develop an individualized exercise prescription for aerobic and resistive training based on initial evaluation findings, risk stratification, comorbidities and participant's personal goals.       Expected Outcomes  Short Term: Increase workloads from initial exercise prescription for resistance, speed, and METs.;Short Term: Perform resistance training exercises routinely during rehab and add in resistance training at home;Long Term: Improve cardiorespiratory fitness, muscular endurance and strength as measured by increased METs and functional capacity (6MWT)       Able to understand and use rate of perceived exertion (RPE) scale  Yes       Intervention  Provide education and explanation on how to use RPE scale       Expected Outcomes  Short Term: Able to use RPE daily in rehab to express subjective intensity level;Long Term:  Able to use RPE to guide intensity level when exercising independently       Able to understand and use Dyspnea scale  Yes       Intervention  Provide education and explanation on how to use Dyspnea scale       Expected Outcomes  Short Term: Able to use Dyspnea scale daily in rehab to express subjective sense of shortness of breath during exertion;Long Term: Able to use Dyspnea scale to guide intensity level when exercising independently       Knowledge and understanding of Target Heart Rate Range (THRR)  Yes       Intervention  Provide education and explanation of THRR including how the numbers  were predicted and where they are located for reference       Expected Outcomes  Short Term: Able to state/look up THRR;Short Term: Able to use daily as guideline for intensity in rehab;Long Term: Able to use THRR to govern intensity when  exercising independently       Able to check pulse independently  Yes       Intervention  Provide education and demonstration on how to check pulse in carotid and radial arteries.;Review the importance of being able to check your own pulse for safety during independent exercise       Expected Outcomes  Short Term: Able to explain why pulse checking is important during independent exercise;Long Term: Able to check pulse independently and accurately       Understanding of Exercise Prescription  Yes       Intervention  Provide education, explanation, and written materials on patient's individual exercise prescription       Expected Outcomes  Short Term: Able to explain program exercise prescription;Long Term: Able to explain home exercise prescription to exercise independently          Exercise Goals Re-Evaluation : Exercise Goals Re-Evaluation    Row Name 10/13/17 1508 10/22/17 1233 10/27/17 1452 11/10/17 1352 11/26/17 1432     Exercise Goal Re-Evaluation   Exercise Goals Review  Increase Physical Activity;Increase Strength and Stamina;Able to understand and use rate of perceived exertion (RPE) scale;Able to understand and use Dyspnea scale  Increase Physical Activity;Increase Strength and Stamina;Understanding of Exercise Prescription  Increase Physical Activity;Increase Strength and Stamina;Understanding of Exercise Prescription  Increase Physical Activity;Increase Strength and Stamina;Understanding of Exercise Prescription  Increase Physical Activity;Increase Strength and Stamina;Understanding of Exercise Prescription   Comments  Reviewed RPE scale, THR and program prescription with pt today.  Pt voiced understanding and was given a copy of goals to take home.   Reviewed home exercise with pt today.  Pt plans to walk at home for exercise.  He is going to start with one day a week.  Reviewed THR, pulse, RPE, sign and symptoms, and when to call 911 or MD.  Also discussed weather considerations and  indoor options.  Pt voiced understanding.  Kaushal is doing well in rehab.  He is doing well on 3 liters for exercise but he still gets SOB on teh treadmill.  We will continue to try to increase workloads and monitor his progress.   Audie continues to well in rehab.  He is steady on his workloads but everything is finally down to RPE of 11 so we will increase his workloads. We will continue to monitor his progression.   Amron has been doing well in rehab.  He is now up to 2.3 mph on the treadmill.  We will continue to monitor his progression.    Expected Outcomes  Short: Use RPE daily to regulate intensity. Long: Follow program prescription in THR.  Short: Add in at least one extra day a week.  Long: Continue to exercise independently  Short: Increase workloads.  Long: Continue to improve strength and stamina.   Short: Increase workloads.  Long: Continue to increase physical activity.   Short: Continue to work on [place].  Long: Continue to build strength and stamina.    Zillah Name 12/08/17 1406 12/23/17 1510 01/06/18 1451 01/20/18 1358 02/02/18 1630     Exercise Goal Re-Evaluation   Exercise Goals Review  Increase Physical Activity;Increase Strength and Stamina;Understanding of Exercise Prescription  Increase Physical Activity;Increase Strength and Stamina;Understanding  of Exercise Prescription  Increase Physical Activity;Increase Strength and Stamina;Understanding of Exercise Prescription  Increase Physical Activity;Increase Strength and Stamina;Understanding of Exercise Prescription  Increase Physical Activity;Increase Strength and Stamina;Understanding of Exercise Prescription   Comments  Mylen continues to do well in rehab.  He is up to level 6 on the XR.  We will continue to monitor his progress.   Krew has only attended twice since last review due to transportation issues. Thus, he has not had much progress over the last two weeks.  We will continue to encourage better attendance and  monitor his progress.   Windel has only attended three times since last visit, but he was supposed to be getting his truck fixed this week.  We will continue to encourage attendance and montior his progress.   Trayvion's attendance has gotten better over the last two weeks.  He has been maintaining his workloads and should be ready for an increase.  He is nearing graduation already.  We will continue to monitor his progress.   Xiong will be graduating this week.  He improved his walk test by 15 ft.   He is planning to continue to exercise by walking on his own.    Expected Outcomes  Short: Increase T5 NuStep.  Long: Continue to improve stamina and possibly reduce oxygen use.   Short: Attend class regularly.  Long: Continue to increase physical activity.   Short: Return to class regularly.  Long: Continue to improve strength and stamina.   Short: Continue to attend regularly.  Long: Continue to improve strength and stamina.   Short: Graduation  Long: Continue to exercise independently      Discharge Exercise Prescription (Final Exercise Prescription Changes): Exercise Prescription Changes - 02/02/18 1600      Response to Exercise   Blood Pressure (Admit)  116/68    Blood Pressure (Exit)  122/70    Heart Rate (Admit)  91 bpm    Heart Rate (Exercise)  107 bpm    Heart Rate (Exit)  99 bpm    Oxygen Saturation (Admit)  91 %    Oxygen Saturation (Exercise)  95 %    Oxygen Saturation (Exit)  97 %    Rating of Perceived Exertion (Exercise)  13    Perceived Dyspnea (Exercise)  2    Symptoms  SOB    Duration  Continue with 45 min of aerobic exercise without signs/symptoms of physical distress.    Intensity  THRR unchanged      Progression   Progression  Continue to progress workloads to maintain intensity without signs/symptoms of physical distress.    Average METs  3.17      Resistance Training   Training Prescription  Yes    Weight  4 lbs    Reps  10-15      Interval Training   Interval  Training  No      Oxygen   Oxygen  Continuous    Liters  2      Treadmill   MPH  2.3    Grade  0.5    Minutes  15    METs  2.92      REL-XR   Level  6    Minutes  15    METs  4.6      T5 Nustep   Level  4    Minutes  15    METs  2      Home Exercise Plan   Plans to continue exercise at  Home (comment)   walking   Frequency  Add 2 additional days to program exercise sessions.    Initial Home Exercises Provided  10/22/17       Nutrition:  Target Goals: Understanding of nutrition guidelines, daily intake of sodium <159m, cholesterol <2041m calories 30% from fat and 7% or less from saturated fats, daily to have 5 or more servings of fruits and vegetables.  Biometrics: Pre Biometrics - 10/06/17 1424      Pre Biometrics   Height  5' 7.2" (1.707 m)    Weight  155 lb 14.4 oz (70.7 kg)    Waist Circumference  35 inches    Hip Circumference  37 inches    Waist to Hip Ratio  0.95 %    BMI (Calculated)  24.27      Post Biometrics - 01/23/18 1113       Post  Biometrics   Height  5' 7.2" (1.707 m)    Weight  150 lb (68 kg)    Waist Circumference  34 inches    Hip Circumference  35.25 inches    Waist to Hip Ratio  0.96 %    BMI (Calculated)  23.35       Nutrition Therapy Plan and Nutrition Goals: Nutrition Therapy & Goals - 10/22/17 1238      Nutrition Therapy   Diet  TLC    Protein (specify units)  9oz    Fiber  30 grams   likely not meeting fiber goal   Whole Grain Foods  3 servings   does not usually choose whole grains   Saturated Fats  16 max. grams    Fruits and Vegetables  5 servings/day   8 ideal; does not eat fruit daily    Sodium  2000 grams   does not monitor sodium intake     Personal Nutrition Goals   Nutrition Goal  Start to pay attention to the sodium content in foods, particularly for pre-prepared meals and canned items. Choose foods with less than 30071mf sodium    Personal Goal #2  Increase water consumption, start by swapping one soda  for a glass of water per day    Personal Goal #3  Look in the frozen section for vegetables and beans, rather than choosing canned. They can be a cheap and quick-cook option that you can add as a side to any meal    Comments  He is looking to maintain is CBW. Eats several processed snack foods daily (usually in place of dinner), eats fried foods, does not monitor salt, and most meals consumed are ready-prepared/ frozen      Intervention Plan   Intervention  Prescribe, educate and counsel regarding individualized specific dietary modifications aiming towards targeted core components such as weight, hypertension, lipid management, diabetes, heart failure and other comorbidities.;Nutrition handout(s) given to patient.   nutrition guidelines for COPD handout   Expected Outcomes  Short Term Goal: Understand basic principles of dietary content, such as calories, fat, sodium, cholesterol and nutrients.;Short Term Goal: A plan has been developed with personal nutrition goals set during dietitian appointment.;Long Term Goal: Adherence to prescribed nutrition plan.       Nutrition Assessments:   Nutrition Goals Re-Evaluation: Nutrition Goals Re-Evaluation    Row Name 10/22/17 1245 11/26/17 1228 12/24/17 1222         Goals   Nutrition Goal  Start to pay attention to the sodium content in foods, particularly for pre-prepared meals and canned items. Choose foods  with less than 368m of sodium  Start to pay attention to the sodium content in foods, particularly for canned or pre-packaged items; Increase fluid intake- ideally swapping sodas for water; look for frozen vegetables as an alternative to canned as one way to reduce salt in your diet  Start to pay attention to the sodium content of foods, particularly canned and pre-packaged items; increase fluid intake; look for frozen vegetables rather than canned as a way to reduce sodium content     Comment  Currently he does not pay attention the sodium content  in foods. His diet is high in pre-packaged foods, canned items, frozen meals, fried foods  He has started to be mindful of sodium intake and reports wt loss of ~5# and improved breathing as a result. He is choosing low sodium canned options and now buys no-salt frozen vegetables as well. Overall vegetable intake has incrased and he has swapped some sodas for water. He is not eating pre-prepared foods regularly  He feels he is doing better about monitoring sodium intake and does not buy as many canned vegetables/ frozen meals and instead has been cooking more. He has been doing more baking than frying for meats and tries to drink 1 gallon of water per day. He is reading some food labels but does not yet read them consistently. He has lost 4-5# and is eating fruits more regularly     Expected Outcome  He will start to read food labels and become more aware of the salt in foods he consumes. Ideally, he will choose foods with less than 3053mof sodium  He will continue to educate himself on a low sodium diet and choose food and beverage items which are lower in salt / that contain no salt. He will continue to decrease soda consumption and incrase water instead.  He will continue to make food swaps which will lower his total daily sodium intake and continue to choose non-fried cooking options. He will be more consistent about reading food labels and also continue to maintain increased fluid intake       Personal Goal #2 Re-Evaluation   Personal Goal #2  Increase water consumption, start by swapping one soda for a glass of water per day  -  -       Personal Goal #3 Re-Evaluation   Personal Goal #3  Look in the frozen section for vegetables and beans, rather than choosing canned. They can be a cheap and quick- cook option that you can add as a side to any meal  -  -        Nutrition Goals Discharge (Final Nutrition Goals Re-Evaluation): Nutrition Goals Re-Evaluation - 12/24/17 1222      Goals   Nutrition  Goal  Start to pay attention to the sodium content of foods, particularly canned and pre-packaged items; increase fluid intake; look for frozen vegetables rather than canned as a way to reduce sodium content    Comment  He feels he is doing better about monitoring sodium intake and does not buy as many canned vegetables/ frozen meals and instead has been cooking more. He has been doing more baking than frying for meats and tries to drink 1 gallon of water per day. He is reading some food labels but does not yet read them consistently. He has lost 4-5# and is eating fruits more regularly    Expected Outcome  He will continue to make food swaps which will lower his total daily sodium  intake and continue to choose non-fried cooking options. He will be more consistent about reading food labels and also continue to maintain increased fluid intake       Psychosocial: Target Goals: Acknowledge presence or absence of significant depression and/or stress, maximize coping skills, provide positive support system. Participant is able to verbalize types and ability to use techniques and skills needed for reducing stress and depression.   Initial Review & Psychosocial Screening: Initial Psych Review & Screening - 10/06/17 1421      Initial Review   Current issues with  Current Stress Concerns    Source of Stress Concerns  Chronic Illness    Comments  His breathing is his main concern      Family Dynamics   Good Support System?  Yes    Comments  His oldest sister is good for support.      Barriers   Psychosocial barriers to participate in program  The patient should benefit from training in stress management and relaxation.      Screening Interventions   Interventions  Encouraged to exercise;To provide support and resources with identified psychosocial needs;Program counselor consult;Provide feedback about the scores to participant    Expected Outcomes  Short Term goal: Utilizing psychosocial counselor,  staff and physician to assist with identification of specific Stressors or current issues interfering with healing process. Setting desired goal for each stressor or current issue identified.;Long Term Goal: Stressors or current issues are controlled or eliminated.;Short Term goal: Identification and review with participant of any Quality of Life or Depression concerns found by scoring the questionnaire.;Long Term goal: The participant improves quality of Life and PHQ9 Scores as seen by post scores and/or verbalization of changes       Quality of Life Scores:  Scores of 19 and below usually indicate a poorer quality of life in these areas.  A difference of  2-3 points is a clinically meaningful difference.  A difference of 2-3 points in the total score of the Quality of Life Index has been associated with significant improvement in overall quality of life, self-image, physical symptoms, and general health in studies assessing change in quality of life.  PHQ-9: Recent Review Flowsheet Data    Depression screen [facility] 2/9 01/19/2018 01/14/2018 10/06/2017   Decreased Interest 0 0 3   Down, Depressed, Hopeless 0 0 3   PHQ - 2 Score 0 0 6   Altered sleeping 0 0 0   Tired, decreased energy '1 1 2   ' Change in appetite 0 0 0   Feeling bad or failure about yourself  0 0 0   Trouble concentrating 0 0 0   Moving slowly or fidgety/restless 0 0 0   Suicidal thoughts 0 0 0   PHQ-9 Score '1 1 8   ' Difficult doing work/chores Somewhat difficult Somewhat difficult Somewhat difficult     Interpretation of Total Score  Total Score Depression Severity:  1-4 = Minimal depression, 5-9 = Mild depression, 10-14 = Moderate depression, 15-19 = Moderately severe depression, 20-27 = Severe depression   Psychosocial Evaluation and Intervention: Psychosocial Evaluation - 10/22/17 1238      Psychosocial Evaluation & Interventions   Interventions  Stress management education;Encouraged to exercise with the program and  follow exercise prescription    Comments  Counselor met with Mr. Longmore Rossell) today for initial psychosocial evaluation.  He is a 47 year old who has been diagnosed with COPD.  He has a limited support system with a sister in  [place] and he lives with a friend.   Rigoberto reports sleeping well and has a good appetite.  He denies a history of depression or anxiety or any current symptoms and is typically in a positive mood.  Bayani states he was a little depressed initially when he was put on oxygen full-time and lost interest in going places or doing things he used to enjoy.  But, he has since grown to accept this and is working towards getting off the oxygen by coming to this class and learning how to conserve energy; pace himself and used pursed lip breathing.  He reports progress already seen in a few short weeks with being able to park his truck and walk into the class without use of valet parking or being put in a wheel chair.  Counselor commended him on his progress made and his commitment to his goals.  Staff will follow with him.     Expected Outcomes  Short:  Atlas will continue to exercise consistently and walk in and out to his truck without assistance.   Long:  Willam will develop strategies to breathe without full time oxygen; including consistent exercise.      Continue Psychosocial Services   Follow up required by staff       Psychosocial Re-Evaluation: Psychosocial Re-Evaluation    [place] Name 11/07/17 1149 12/17/17 1215 01/14/18 1224         Psychosocial Re-Evaluation   Current issues with  Current Stress Concerns  Current Stress Concerns  Current Stress Concerns     Comments  Patient feels like his breathing has improved slightly. He feels like he can catch his breath faster which makes him more relaxed. He is focused to get his breathing better and has a good attitude.  His truck has been giving him problems and was having trouble getting around.. He states everything is going good.  He wants to get off of his oxygen.  Reviewed patient health questionnaire (PHQ-9) with patient for follow up. Previously, patients score indicated signs/symptoms of depression.  Reviewed to see if patient is improving symptom wise while in program.  Score improved and patient states that it is because he has been coming to [place]. He has had his truck fixed and is able to get to [place].      Expected Outcomes  Short attened [facility] to decrease stress. Long: maintain an exercise program to keep stress at a minimum  Short: keep decreasing oxygen to increase mood. Long: keep a positive attitude and finish [facility].  Short: Continue to attend [facility] regularly for regular exercise and social engagement. Long: Continue to improve symptoms and manage a positive mental state     Interventions  Encouraged to attend Pulmonary Rehabilitation for the exercise  -  Encouraged to attend Pulmonary Rehabilitation for the exercise     Continue Psychosocial Services   Follow up required by staff  Follow up required by staff  Follow up required by staff        Psychosocial Discharge (Final Psychosocial Re-Evaluation): Psychosocial Re-Evaluation - 01/14/18 1224      Psychosocial Re-Evaluation   Current issues with  Current Stress Concerns    Comments  Reviewed patient health questionnaire (PHQ-9) with patient for follow up. Previously, patients score indicated signs/symptoms of depression.  Reviewed to see if patient is improving symptom wise while in program.  Score improved and patient states that it is because he has been coming to [place]. He has had his truck fixed and is  able to get to [place].     Expected Outcomes  Short: Continue to attend [facility] regularly for regular exercise and social engagement. Long: Continue to improve symptoms and manage a positive mental state    Interventions  Encouraged to attend Pulmonary Rehabilitation for the exercise    Continue Psychosocial Services   Follow up  required by staff       Education: Education Goals: Education classes will be provided on a weekly basis, covering required topics. Participant will state understanding/return demonstration of topics presented.  Learning Barriers/Preferences: Learning Barriers/Preferences - 10/06/17 1426      Learning Barriers/Preferences   Learning Barriers  None    Learning Preferences  None       Education Topics:  Initial Evaluation Education: - Verbal, written and demonstration of respiratory meds, oximetry and breathing techniques. Instruction on use of nebulizers and MDIs and importance of monitoring MDI activations.   Pulmonary Rehab from 02/04/2018 in [facility] Cardiac and Pulmonary Rehab  Date  10/06/17  Educator  [facility]  Instruction Review Code  1- Verbalizes Understanding      General Nutrition Guidelines/Fats and Fiber: -Group instruction provided by verbal, written material, models and posters to present the general guidelines for heart healthy nutrition. Gives an explanation and review of dietary fats and fiber.   Pulmonary Rehab from 02/04/2018 in [facility] Cardiac and Pulmonary Rehab  Date  11/12/17  Educator  [facility]  Instruction Review Code  1- Verbalizes Understanding      Controlling Sodium/Reading Food Labels: -Group verbal and written material supporting the discussion of sodium use in heart healthy nutrition. Review and explanation with models, verbal and written materials for utilization of the food label.   Pulmonary Rehab from 02/04/2018 in [facility] Cardiac and Pulmonary Rehab  Date  01/21/18  Educator  [facility]  Instruction Review Code  1- Verbalizes Understanding      Exercise Physiology & General Exercise Guidelines: - Group verbal and written instruction with models to review the exercise physiology of the cardiovascular system and associated critical values. Provides general exercise guidelines with specific guidelines to those with heart or lung disease.    Pulmonary Rehab from  02/04/2018 in [facility] Cardiac and Pulmonary Rehab  Date  11/26/17  Educator  [facility]  Instruction Review Code  1- Verbalizes Understanding      Aerobic Exercise & Resistance Training: - Gives group verbal and written instruction on the various components of exercise. Focuses on aerobic and resistive training programs and the benefits of this training and how to safely progress through these programs.   Flexibility, Balance, Mind/Body Relaxation: Provides group verbal/written instruction on the benefits of flexibility and balance training, including mind/body exercise modes such as yoga, pilates and tai chi.  Demonstration and skill practice provided.   Pulmonary Rehab from 02/04/2018 in [facility] Cardiac and Pulmonary Rehab  Date  12/24/17  Educator  [facility]  Instruction Review Code  1- Verbalizes Understanding      Stress and Anxiety: - Provides group verbal and written instruction about the health risks of elevated stress and causes of high stress.  Discuss the correlation between heart/lung disease and anxiety and treatment options. Review healthy ways to manage with stress and anxiety.   Pulmonary Rehab from 02/04/2018 in [facility] Cardiac and Pulmonary Rehab  Date  01/14/18  Educator  [facility]  Instruction Review Code  1- Verbalizes Understanding      Depression: - Provides group verbal and written instruction on the correlation between heart/lung disease and depressed mood, treatment  options, and the stigmas associated with seeking treatment.   Pulmonary Rehab from 02/04/2018 in [facility] Cardiac and Pulmonary Rehab  Date  12/17/17  Educator  [facility]  Instruction Review Code  1- Verbalizes Understanding      Exercise & Equipment Safety: - Individual verbal instruction and demonstration of equipment use and safety with use of the equipment.   Pulmonary Rehab from 02/04/2018 in [facility] Cardiac and Pulmonary Rehab  Date  10/06/17  Educator  [facility]  Instruction Review Code  1- Verbalizes Understanding      Infection  Prevention: - Provides verbal and written material to individual with discussion of infection control including proper hand washing and proper equipment cleaning during exercise session.   Pulmonary Rehab from 02/04/2018 in [facility] Cardiac and Pulmonary Rehab  Date  10/06/17  Educator  [facility]  Instruction Review Code  1- Verbalizes Understanding      Falls Prevention: - Provides verbal and written material to individual with discussion of falls prevention and safety.   Pulmonary Rehab from 02/04/2018 in [facility] Cardiac and Pulmonary Rehab  Date  10/06/17  Educator  [facility]  Instruction Review Code  1- Verbalizes Understanding      Diabetes: - Individual verbal and written instruction to review signs/symptoms of diabetes, desired ranges of glucose level fasting, after meals and with exercise. Advice that pre and post exercise glucose checks will be done for 3 sessions at entry of program.   Chronic Lung Diseases: - Group verbal and written instruction to review updates, respiratory medications, advancements in procedures and treatments. Discuss use of supplemental oxygen including available portable oxygen systems, continuous and intermittent flow rates, concentrators, personal use and safety guidelines. Review proper use of inhaler and spacers. Provide informative websites for self-education.    Pulmonary Rehab from 02/04/2018 in [facility] Cardiac and Pulmonary Rehab  Date  02/04/18  Educator  [facility]  Instruction Review Code  -- Shona Needles to walk during class, heard most from his physician]      Energy Conservation: - Provide group verbal and written instruction for methods to conserve energy, plan and organize activities. Instruct on pacing techniques, use of adaptive equipment and posture/positioning to relieve shortness of breath.   Pulmonary Rehab from 02/04/2018 in [facility] Cardiac and Pulmonary Rehab  Date  12/31/17  Educator  [facility]  Instruction Review Code  1- Verbalizes Understanding      Triggers and  Exacerbations: - Group verbal and written instruction to review types of environmental triggers and ways to prevent exacerbations. Discuss weather changes, air quality and the benefits of nasal washing. Review warning signs and symptoms to help prevent infections. Discuss techniques for effective airway clearance, coughing, and vibrations.   Pulmonary Rehab from 02/04/2018 in [facility] Cardiac and Pulmonary Rehab  Date  12/03/17  Educator  [facility]  Instruction Review Code  1- Verbalizes Understanding      AED/CPR: - Group verbal and written instruction with the use of models to demonstrate the basic use of the AED with the basic ABC's of resuscitation.   Pulmonary Rehab from 02/04/2018 in [facility] Cardiac and Pulmonary Rehab  Date  10/17/17  Educator  [facility]  Instruction Review Code  1- Actuary and Physiology of the Lungs: - Group verbal and written instruction with the use of models to provide basic lung anatomy and physiology related to function, structure and complications of lung disease.   Pulmonary Rehab from 02/04/2018 in [facility] Cardiac and Pulmonary Rehab  Date  01/16/18  Educator  [facility]  Instruction Review Code  1- Verbalizes Understanding      Anatomy & Physiology of the Heart: - Group verbal and written instruction and models provide basic cardiac anatomy and physiology, with the coronary electrical and arterial systems. Review of Valvular disease and Heart Failure   Cardiac Medications: - Group verbal and written instruction to review commonly prescribed medications for heart disease. Reviews the medication, class of the drug, and side effects.   Pulmonary Rehab from 02/04/2018 in [facility] Cardiac and Pulmonary Rehab  Date  12/26/17  Educator  [facility]  Instruction Review Code  1- Verbalizes Understanding      Know Your Numbers and Risk Factors: -Group verbal and written instruction about important numbers in your health.  Discussion of what are risk factors and how they  play a role in the disease process.  Review of Cholesterol, Blood Pressure, Diabetes, and BMI and the role they play in your overall health.   Pulmonary Rehab from 02/04/2018 in [facility] Cardiac and Pulmonary Rehab  Date  11/28/17  Educator  [facility]  Instruction Review Code  1- Verbalizes Understanding      Sleep Hygiene: -Provides group verbal and written instruction about how sleep can affect your health.  Define sleep hygiene, discuss sleep cycles and impact of sleep habits. Review good sleep hygiene tips.    Other: -Provides group and verbal instruction on various topics (see comments)    Knowledge Questionnaire Score: Knowledge Questionnaire Score - 01/19/18 1618      Knowledge Questionnaire Score   Pre Score  12/18    Post Score  15/18   reviewed with patient       Core Components/Risk Factors/Patient Goals at Admission: Personal Goals and Risk Factors at Admission - 10/06/17 1431      Core Components/Risk Factors/Patient Goals on Admission    Weight Management  Yes;Weight Maintenance    Intervention  Weight Management: Develop a combined nutrition and exercise program designed to reach desired caloric intake, while maintaining appropriate intake of nutrient and fiber, sodium and fats, and appropriate energy expenditure required for the weight goal.;Weight Management: Provide education and appropriate resources to help participant work on and attain dietary goals.;Weight Management/Obesity: Establish reasonable short term and long term weight goals.    Admit Weight  155 lb 14.4 oz (70.7 kg)    Goal Weight: Short Term  155 lb (70.3 kg)    Goal Weight: Long Term  155 lb (70.3 kg)    Expected Outcomes  Short Term: Continue to assess and modify interventions until short term weight is achieved;Long Term: Adherence to nutrition and physical activity/exercise program aimed toward attainment of established weight goal;Weight Maintenance: Understanding of the daily nutrition guidelines, which  includes 25-35% calories from fat, 7% or less cal from saturated fats, less than 274m cholesterol, less than 1.5gm of sodium, & 5 or more servings of fruits and vegetables daily;Understanding recommendations for meals to include 15-35% energy as protein, 25-35% energy from fat, 35-60% energy from carbohydrates, less than 2023mof dietary cholesterol, 20-35 gm of total fiber daily;Understanding of distribution of calorie intake throughout the day with the consumption of 4-5 meals/snacks    Improve shortness of breath with ADL's  Yes    Intervention  Provide education, individualized exercise plan and daily activity instruction to help decrease symptoms of SOB with activities of daily living.    Expected Outcomes  Long Term: Be able to perform more ADLs without symptoms or delay the onset of symptoms;Short Term: Improve cardiorespiratory  fitness to achieve a reduction of symptoms when performing ADLs       Core Components/Risk Factors/Patient Goals Review:  Goals and Risk Factor Review    Row Name 11/07/17 1153 12/17/17 1219 01/14/18 1233         Core Components/Risk Factors/Patient Goals Review   Personal Goals Review  Weight Management/Obesity;Improve shortness of breath with ADL's  Weight Management/Obesity;Improve shortness of breath with ADL's  Weight Management/Obesity;Improve shortness of breath with ADL's;Other     Review  Patient has been doing well with his weight. He gets short of breath when he eats. When he lays down and gets up he gets short of breath. Grocery shopping is hard for him if he takes it slow. He  has his oxygen in his truck and takes his oxygen with him at all times.   His breathing has been better. Mavrik has more of an understanding about his illness. Patient is using 1 liter of oxygen while exercising and hopes to get off of oxygen.  [place] has been out due to his truck not running. He has since had it fixed and will attend [facility] regularly. Shortness of breath has been  worse in the morning. When he gets up and starts moving he feels better. He is maintaining his weight at 150lbs.      Expected Outcomes  Short: attened [facility] to improve ADLs. Long: Maintain ADLs independently while checking oxygen.  Short: decrease oxygen to room air. Long: stay off oxygen while maintaing ADL.  Short: attend [facility] regularly now that his truck is fixed. Long: graduate [employer].        Core Components/Risk Factors/Patient Goals at Discharge (Final Review):  Goals and Risk Factor Review - 01/14/18 1233      Core Components/Risk Factors/Patient Goals Review   Personal Goals Review  Weight Management/Obesity;Improve shortness of breath with ADL's;Other    Review  [place] has been out due to his truck not running. He has since had it fixed and will attend [facility] regularly. Shortness of breath has been worse in the morning. When he gets up and starts moving he feels better. He is maintaining his weight at 150lbs.     Expected Outcomes  Short: attend [facility] regularly now that his truck is fixed. Long: graduate [employer].       ITP Comments: ITP Comments    Row Name 10/06/17 1403 10/27/17 0835 11/24/17 0837 12/22/17 0855 01/19/18 0841   ITP Comments  Medical Evaluation completed. Chart sent for review and changes to Dr. Caryl Comes for Dr. Emily Filbert Director of [place]. Diagnosis can be found in [facility] encounter 09/02/16  30 day review completed. ITP sent to Dr. Emily Filbert Director of [place]. Continue with ITP unless changes are made by physician  30 day review completed. ITP sent to Dr. Emily Filbert Director of [place]. Continue with ITP unless changes are made by physician  30 day review completed. ITP sent to Dr. Emily Filbert Director of [place]. Continue with ITP unless changes are made by physician.  30 day review completed. ITP sent to Dr. Emily Filbert Director of [place]. Continue with ITP unless changes are made by physician.      Comments: Discharge ITP

## 2019-01-24 ENCOUNTER — Inpatient Hospital Stay
Admission: EM | Admit: 2019-01-24 | Discharge: 2019-01-27 | DRG: 193 | Disposition: A | Discharge status: Home / Self Care | Payer: Medicaid Other | Attending: Internal Medicine | Admitting: Internal Medicine

## 2019-01-24 ENCOUNTER — Encounter: Payer: Self-pay | Admitting: *Deleted

## 2019-01-24 ENCOUNTER — Other Ambulatory Visit: Payer: Self-pay

## 2019-01-24 ENCOUNTER — Emergency Department: Payer: Medicaid Other

## 2019-01-24 DIAGNOSIS — Z7982 Long term (current) use of aspirin: Secondary | ICD-10-CM

## 2019-01-24 DIAGNOSIS — Z825 Family history of asthma and other chronic lower respiratory diseases: Secondary | ICD-10-CM | POA: Diagnosis not present

## 2019-01-24 DIAGNOSIS — R7611 Nonspecific reaction to tuberculin skin test without active tuberculosis: Secondary | ICD-10-CM | POA: Diagnosis present

## 2019-01-24 DIAGNOSIS — I5030 Unspecified diastolic (congestive) heart failure: Secondary | ICD-10-CM | POA: Diagnosis present

## 2019-01-24 DIAGNOSIS — R627 Adult failure to thrive: Secondary | ICD-10-CM | POA: Diagnosis present

## 2019-01-24 DIAGNOSIS — Z9981 Dependence on supplemental oxygen: Secondary | ICD-10-CM | POA: Diagnosis not present

## 2019-01-24 DIAGNOSIS — Z87891 Personal history of nicotine dependence: Secondary | ICD-10-CM

## 2019-01-24 DIAGNOSIS — J441 Chronic obstructive pulmonary disease with (acute) exacerbation: Secondary | ICD-10-CM | POA: Diagnosis present

## 2019-01-24 DIAGNOSIS — Z79899 Other long term (current) drug therapy: Secondary | ICD-10-CM

## 2019-01-24 DIAGNOSIS — J9621 Acute and chronic respiratory failure with hypoxia: Secondary | ICD-10-CM | POA: Diagnosis present

## 2019-01-24 DIAGNOSIS — J9622 Acute and chronic respiratory failure with hypercapnia: Secondary | ICD-10-CM | POA: Diagnosis present

## 2019-01-24 DIAGNOSIS — J9601 Acute respiratory failure with hypoxia: Secondary | ICD-10-CM | POA: Diagnosis not present

## 2019-01-24 DIAGNOSIS — J439 Emphysema, unspecified: Secondary | ICD-10-CM | POA: Diagnosis present

## 2019-01-24 DIAGNOSIS — R64 Cachexia: Secondary | ICD-10-CM | POA: Diagnosis present

## 2019-01-24 DIAGNOSIS — D696 Thrombocytopenia, unspecified: Secondary | ICD-10-CM | POA: Diagnosis present

## 2019-01-24 DIAGNOSIS — Z6822 Body mass index (BMI) 22.0-22.9, adult: Secondary | ICD-10-CM

## 2019-01-24 DIAGNOSIS — J189 Pneumonia, unspecified organism: Principal | ICD-10-CM | POA: Diagnosis present

## 2019-01-24 DIAGNOSIS — D649 Anemia, unspecified: Secondary | ICD-10-CM | POA: Diagnosis present

## 2019-01-24 DIAGNOSIS — E43 Unspecified severe protein-calorie malnutrition: Secondary | ICD-10-CM | POA: Diagnosis present

## 2019-01-24 DIAGNOSIS — E872 Acidosis: Secondary | ICD-10-CM | POA: Diagnosis present

## 2019-01-24 DIAGNOSIS — Z20828 Contact with and (suspected) exposure to other viral communicable diseases: Secondary | ICD-10-CM | POA: Diagnosis present

## 2019-01-24 LAB — BASIC METABOLIC PANEL
BUN: 15 mg/dL (ref 6–20)
CO2: >50 mmol/L — ABNORMAL HIGH (ref 22–32)
Calcium: 9.1 mg/dL (ref 8.9–10.3)
Chloride: 84 mmol/L — ABNORMAL LOW (ref 98–111)
Creatinine, Ser: 0.55 mg/dL — ABNORMAL LOW (ref 0.61–1.24)
GFR calc Af Amer: >60 mL/min (ref >60)
GFR calc non Af Amer: >60 mL/min (ref >60)
Glucose, Bld: 123 mg/dL — ABNORMAL HIGH (ref 70–99)
Potassium: 5 mmol/L (ref 3.5–5.1)
Sodium: 144 mmol/L (ref 135–145)

## 2019-01-24 LAB — CBC WITH DIFFERENTIAL/PLATELET
Abs Immature Granulocytes: 0.03 10*3/uL (ref 0.00–0.07)
Basophils Absolute: 0 10*3/uL (ref 0.0–0.1)
Basophils Relative: 0 %
Eosinophils Absolute: 0.1 10*3/uL (ref 0.0–0.5)
Eosinophils Relative: 2 %
HCT: 41.6 % (ref 39.0–52.0)
Hemoglobin: 12.2 g/dL — ABNORMAL LOW (ref 13.0–17.0)
Immature Granulocytes: 0 %
Lymphocytes Relative: 19 %
Lymphs Abs: 1.4 10*3/uL (ref 0.7–4.0)
MCH: 30.4 pg (ref 26.0–34.0)
MCHC: 29.3 g/dL — ABNORMAL LOW (ref 30.0–36.0)
MCV: 103.7 fL — ABNORMAL HIGH (ref 80.0–100.0)
Monocytes Absolute: 0.8 10*3/uL (ref 0.1–1.0)
Monocytes Relative: 10 %
Neutro Abs: 5 10*3/uL (ref 1.7–7.7)
Neutrophils Relative %: 69 %
Platelets: 106 10*3/uL — ABNORMAL LOW (ref 150–400)
RBC: 4.01 MIL/uL — ABNORMAL LOW (ref 4.22–5.81)
RDW: 12.1 % (ref 11.5–15.5)
WBC: 7.3 10*3/uL (ref 4.0–10.5)
nRBC: 0 % (ref 0.0–0.2)

## 2019-01-24 LAB — TROPONIN I (HIGH SENSITIVITY)
Troponin I (High Sensitivity): 7 ng/L (ref <18)
Troponin I (High Sensitivity): 6 ng/L (ref <18)

## 2019-01-24 LAB — PROCALCITONIN: Procalcitonin: <0.1 ng/mL

## 2019-01-24 LAB — SARS CORONAVIRUS 2 (TAT 6-24 HRS): SARS Coronavirus 2: NEGATIVE

## 2019-01-24 LAB — LACTIC ACID, PLASMA: Lactic Acid, Venous: 0.7 mmol/L (ref 0.5–1.9)

## 2019-01-24 MED ORDER — MAGNESIUM SULFATE 2 GM/50ML IV SOLN
2.0000 g | Freq: Once | INTRAVENOUS | Status: AC
  Administered 2019-01-24: 2 g via INTRAVENOUS
  Filled 2019-01-24: qty 50

## 2019-01-24 MED ORDER — SODIUM CHLORIDE 0.9 % IV SOLN
INTRAVENOUS | Status: DC
  Administered 2019-01-24: 22:00:00 via INTRAVENOUS

## 2019-01-24 MED ORDER — MAGNESIUM SULFATE 2 GM/50ML IV SOLN: 2.0000 g | Freq: Once | INTRAVENOUS | Status: DC

## 2019-01-24 MED ORDER — ALBUTEROL (5 MG/ML) CONTINUOUS INHALATION SOLN
10.0000 mg/h | INHALATION_SOLUTION | Freq: Once | RESPIRATORY_TRACT | Status: AC
  Administered 2019-01-24: 10 mg/h via RESPIRATORY_TRACT
  Filled 2019-01-24: qty 20

## 2019-01-24 MED ORDER — SODIUM CHLORIDE 0.9 % IV BOLUS
1000.0000 mL | Freq: Once | INTRAVENOUS | Status: AC
  Administered 2019-01-24: 1000 mL via INTRAVENOUS

## 2019-01-24 MED ORDER — MOMETASONE FURO-FORMOTEROL FUM 200-5 MCG/ACT IN AERO
2.0000 | INHALATION_SPRAY | Freq: Two times a day (BID) | RESPIRATORY_TRACT | Status: DC
  Administered 2019-01-25: 11:00:00 2 via RESPIRATORY_TRACT
  Filled 2019-01-24: qty 8.8

## 2019-01-24 MED ORDER — SODIUM CHLORIDE 0.9 % IV SOLN
500.0000 mg | INTRAVENOUS | Status: DC
  Administered 2019-01-26: 500 mg via INTRAVENOUS
  Filled 2019-01-24 (×2): qty 500

## 2019-01-24 MED ORDER — AZITHROMYCIN 500 MG IV SOLR
500.0000 mg | Freq: Once | INTRAVENOUS | Status: AC
  Administered 2019-01-24: 500 mg via INTRAVENOUS
  Filled 2019-01-24: qty 500

## 2019-01-24 MED ORDER — METHYLPREDNISOLONE SODIUM SUCC 40 MG IJ SOLR: 40.0000 mg | Freq: Every day | INTRAMUSCULAR | Status: DC

## 2019-01-24 MED ORDER — ENOXAPARIN SODIUM 40 MG/0.4ML ~~LOC~~ SOLN: 40.0000 mg | SUBCUTANEOUS | Status: DC

## 2019-01-24 MED ORDER — CEFTRIAXONE SODIUM 2 G IJ SOLR
2.0000 g | Freq: Once | INTRAMUSCULAR | Status: AC
  Administered 2019-01-24: 2 g via INTRAVENOUS
  Filled 2019-01-24: qty 20

## 2019-01-24 MED ORDER — ALBUTEROL SULFATE (2.5 MG/3ML) 0.083% IN NEBU: 2.5000 mg | INHALATION_SOLUTION | RESPIRATORY_TRACT | Status: DC | PRN

## 2019-01-24 MED ORDER — IPRATROPIUM-ALBUTEROL 0.5-2.5 (3) MG/3ML IN SOLN
3.0000 mL | RESPIRATORY_TRACT | Status: DC
  Administered 2019-01-24 – 2019-01-27 (×16): 3 mL via RESPIRATORY_TRACT
  Filled 2019-01-24 (×15): qty 3

## 2019-01-24 MED ORDER — METHYLPREDNISOLONE SODIUM SUCC 125 MG IJ SOLR
125.0000 mg | Freq: Once | INTRAMUSCULAR | Status: AC
  Administered 2019-01-24: 125 mg via INTRAVENOUS
  Filled 2019-01-24: qty 2

## 2019-01-24 MED ORDER — SODIUM CHLORIDE 0.9 % IV SOLN
1.0000 g | INTRAVENOUS | Status: DC
  Administered 2019-01-25: 1 g via INTRAVENOUS
  Filled 2019-01-24: qty 10
  Filled 2019-01-24: qty 1

## 2019-01-24 MED ORDER — IPRATROPIUM BROMIDE 0.02 % IN SOLN
1.0000 mg | Freq: Once | RESPIRATORY_TRACT | Status: AC
  Administered 2019-01-24: 1 mg via RESPIRATORY_TRACT
  Filled 2019-01-24: qty 5

## 2019-01-24 NOTE — H&P (Addendum)
History and Physical    Chris Case FUX:[id number] DOB: [birth year] DOA: 01/24/2019  PCP: [facility]   Patient coming from: Home  I have personally briefly reviewed patient's old medical records in [facility]  Chief Complaint: increase work of breathing, confusion  HPI: Chris Case is a 48 y.o. male with medical history significant of COPD on chronic 2L oxygen and failure to thrive who presents with concerns of weakness and shortness of breath. Reportedly patient's roommate called EMS because pt has been working harder to breath over the past day and becoming more confused. Also refused PO intake for 2-3 days. He was found to be hypoxic down to 80% oxygen saturation even on 2L by EMS and was placed on NRB. BMP showed profound respiratory acidosis with CO2>50 and was started on Bipap. ABG after an hour of BIPAP showed pH of 7.35, Pco2 OF 112. CBC showed no leukocytosis, mild anemia of 12.2, low platelet of 106. Lactate 0.7.  CXR shows possible right middle lobe pneumonia although procalcitonin was low. He was given continuous neb, IV solu-medrol, IV magnesium and started on IV Rocephin and Azithromcyin.  Patient not able to recall much history. He felt that he was doing okay and was taking his inhalers at regular intervals. Endorse more coughing but denies fever. Felt that he was eating okay but he likes to "nibble." Patient urinated in bed prior to my evaluation since he was not able to get nursing assistance in time.   He was a former smoker but quit 30 years ago. Denies alcohol or illicit drug use.  Family hx of Mother who had COPD   Review of Systems:  Constitutional: No Weight Change, No Fever ENT/Mouth: No sore throat, No Rhinorrhea Eyes: No Eye Pain, No Vision Changes Cardiovascular: No Chest Pain,+ SOB Respiratory: + Cough, No Sputum, No Wheezing, no Dyspnea  Gastrointestinal: No Nausea, No Vomiting, No Diarrhea, No Constipation, No Pain Genitourinary:  no Urinary Incontinence, No Urgency, No Flank Pain Musculoskeletal: No Arthralgias, No Myalgias Skin: No Skin Lesions, No Pruritus, Neuro: no Weakness, No Numbness,  No Loss of Consciousness, No Syncope Psych: No Anxiety/Panic, No Depression, no decrease appetite Heme/Lymph: No Bruising, No Bleeding  Past Medical History:  Diagnosis Date  . COPD (chronic obstructive pulmonary disease) (HCC)   . Emphysema lung (HCC)   . 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 APPENDECTOMY  late 1980's  . LAPAROTOMY N/A 12/06/2016   Procedure: EXPLORATORY LAPAROTOMY- SBO;  Surgeon: [name], MD;  Location: [facility] ORS;  Service: General;  Laterality: N/A;     reports that he quit smoking about 2 years ago. His smoking use included cigarettes. He has a 60.00 pack-year smoking history. He has never used smokeless tobacco. He reports current drug use. Drug: Marijuana. He reports that he does not drink alcohol.  No Known Allergies  Family History  Problem Relation Age of Onset  . COPD Mother      Prior to Admission medications   Medication Sig Start Date End Date Taking? Authorizing Provider  albuterol (PROVENTIL) (2.5 MG/3ML) 0.083% nebulizer solution Inhale 3 mLs into the lungs every 4 (four) hours as needed for wheezing or shortness of breath. 09/26/15  Yes Katharina Caper, MD  albuterol (VENTOLIN HFA) 108 (90 Base) MCG/ACT inhaler Inhale 2 puffs into the lungs every 4 (four) hours as needed for shortness of breath. 01/09/19  Yes  [provider]  mometasone-formoterol (DULERA) 200-5 MCG/ACT AERO Inhale 2 puffs into the lungs 2 (two) times daily. 09/26/15  Yes Theodoro Grist, MD  Tiotropium Bromide Monohydrate (SPIRIVA RESPIMAT) 1.25 MCG/ACT AERS Inhale 1 puff into the lungs daily.    Yes [provider]    Physical Exam: Vitals:   01/24/19 1537 01/24/19 1600 01/24/19 1607 01/24/19 1630  BP: 122/77 119/80   118/82  Pulse: 90 87 90 84  Resp: 18 17 16 20   Temp:      TempSrc:      SpO2: 95% 95% 95% 95%  Weight:      Height:        Constitutional: Thin cachectic non-toxic male sitting up in bed alert and on Bipap Vitals:   01/24/19 1537 01/24/19 1600 01/24/19 1607 01/24/19 1630  BP: 122/77 119/80  118/82  Pulse: 90 87 90 84  Resp: 18 17 16 20   Temp:      TempSrc:      SpO2: 95% 95% 95% 95%  Weight:      Height:       Eyes: PERRL, lids and conjunctivae normal ENMT: dry and scaly lips. adequate dentition.  Neck: normal, supple, no masses,  Respiratory: Poor aeration throughout with faint expiratory wheezes on BiPAP. Normal respiratory effort. No accessory muscle use.  Cardiovascular: Regular rate and rhythm, no murmurs / rubs / gallops. No extremity edema. 2+ pedal pulses.  Abdomen: no tenderness, no masses palpated. Bowel sounds positive.  Musculoskeletal: no clubbing / cyanosis. No joint deformity upper and lower extremities. Good ROM, no contractures. Normal muscle tone.  Skin: no rashes, lesions, ulcers. No induration Neurologic: CN 2-12 grossly intact. Sensation intact. Strength 5/5 in all 4.  Psychiatric: Normal judgment and insight. Alert and oriented x 3. Normal mood.     Labs on Admission: I have personally reviewed following labs and imaging studies  CBC: Recent Labs  Lab 01/24/19 1511  WBC 7.3  NEUTROABS 5.0  HGB 12.2*  HCT 41.6  MCV 103.7*  PLT 751*   Basic Metabolic Panel: Recent Labs  Lab 01/24/19 1511  NA 144  K 5.0  CL 84*  CO2 >50*  GLUCOSE 123*  BUN 15  CREATININE 0.55*  CALCIUM 9.1   GFR: Estimated Creatinine Clearance: 97.8 mL/min (A) (by C-G formula based on SCr of 0.55 mg/dL (L)). Liver Function Tests: No results for input(s): AST, ALT, ALKPHOS, BILITOT, PROT, ALBUMIN in the last 168 hours. No results for input(s): LIPASE, AMYLASE in the last 168 hours. No results for input(s): AMMONIA in the last 168 hours. Coagulation Profile: No  results for input(s): INR, PROTIME in the last 168 hours. Cardiac Enzymes: No results for input(s): CKTOTAL, CKMB, CKMBINDEX, TROPONINI in the last 168 hours. BNP (last 3 results) No results for input(s): PROBNP in the last 8760 hours. HbA1C: No results for input(s): HGBA1C in the last 72 hours. CBG: No results for input(s): GLUCAP in the last 168 hours. Lipid Profile: No results for input(s): CHOL, HDL, LDLCALC, TRIG, CHOLHDL, LDLDIRECT in the last 72 hours. Thyroid Function Tests: No results for input(s): TSH, T4TOTAL, FREET4, T3FREE, THYROIDAB in the last 72 hours. Anemia Panel: No results for input(s): VITAMINB12, FOLATE, FERRITIN, TIBC, IRON, RETICCTPCT in the last 72 hours. Urine analysis:    Component Value Date/Time   COLORURINE YELLOW (A) 12/04/2016 1959   APPEARANCEUR CLEAR (A) 12/04/2016 1959   LABSPEC >1.046 (H) 12/04/2016 1959   PHURINE 5.0 12/04/2016 [place] NEGATIVE 12/04/2016 1959  HGBUR NEGATIVE 12/04/2016 1959   BILIRUBINUR NEGATIVE 12/04/2016 1959   KETONESUR 5 (A) 12/04/2016 1959   PROTEINUR 30 (A) 12/04/2016 1959   NITRITE NEGATIVE 12/04/2016 1959   LEUKOCYTESUR NEGATIVE 12/04/2016 1959    Radiological Exams on Admission: Dg Chest Portable 1 View  Result Date: 01/24/2019 CLINICAL DATA:  Shortness of breath.  COVID-19 positive. EXAM: PORTABLE CHEST 1 VIEW COMPARISON:  Septae 2317 and 08/23/2015. FINDINGS: Midline trachea. Normal heart size. No pleural effusion or pneumothorax. Hyperinflation. Medial right lower lobe opacity. Diffuse peribronchial thickening. IMPRESSION: Medial right lower lobe airspace disease, most consistent with pneumonia. Hyperinflation and interstitial thickening, likely due to COPD/chronic bronchitis. Electronically Signed   By: Jeronimo GreavesKyle  Talbot M.D.   On: 01/24/2019 15:49     Assessment/Plan  Acute on chronic hypoxic and hypercapnic respiratory failure in the setting of COPD exacerbation and questionable right middle lobe  pneumonia - Admit to SDU and as PUI pending COVID test - currently on Bipap. Follow ABG and wean as tolerated per RT. - Pt normally on 2L at home-maintain O2 at 88-92% - duonebs scheduled q4hr  - albuterol PRN q2hr - daily IV 40mg  Solu-Medrol - Continue Rocephin and Azithromycin until return of respiratory viral panel and COVID test.  Procalcitonin was low. - Continue home ICS bronchodilator  Thrombocytopenia -Platelet of 106 on admission.  No signs of bleeding. -Likely reactive secondary to infection  Protein calorie malnutrition -Severe.  Consult dietitian.  DVT prophylaxis: SCD Code Status:Full Family Communication: Plan discussed with patient at bedside  disposition Plan: Home with at least 2 midnight stays  Consults called:  Admission status: inpatient and admit to SDU  given Bipap requirement   Shalece Staffa T Quintyn Dombek DO [facility]   If 7PM-7AM, please contact night-coverage [email or web address] Password [email or web address]  01/24/2019, 7:41 PM

## 2019-01-24 NOTE — ED Provider Notes (Signed)
[facility] Emergency Department Provider Note  ____________________________________________   First MD Initiated Contact with Patient 01/24/19 1517     (approximate)  I have reviewed the triage vital signs and the nursing notes.   HISTORY  Chief Complaint Shortness of Breath and Failure To Thrive    HPI Chris Case is a 48 y.o. male  With h/o severe COPD, chronic malnutrition, here with reports weakness, confusion. History somewhat limited 2/2 confusion. Per report, his roommate called because pt has refused all PO intake x 2-3 days. He has been working harder to breathe and over the last 24 hours, has been more confused. Per EMS, pt was hypoxic on his home 2L when they got there, arrives on NRB. Was in the "80s." He was initially more confused but is now increasingly alert. Denies any complaints on my assessment, though unable to recall details of how he got here.  Level 5 caveat invoked as remainder of history, ROS, and physical exam limited due to patient's AMS.         Past Medical History:  Diagnosis Date  . COPD (chronic obstructive pulmonary disease) (HCC)   . Emphysema lung (HCC)   . 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 Protein-calorie malnutrition, severe 12/10/2016  . Intestinal adhesions with complete obstruction (HCC) 12/05/2016  . Constipation   . SBO (small bowel obstruction) (HCC)   . Acute bronchitis 09/26/2015  . COPD exacerbation (HCC) 09/24/2015  . Acute on chronic respiratory failure with hypoxia and hypercapnia (HCC) 09/24/2015  . Abnormal ECG 09/24/2015  . Edema, lower extremity 09/24/2015    Past Surgical History:  Procedure Laterality Date  . APPENDECTOMY  late 1980's  . LAPAROTOMY N/A 12/06/2016   Procedure: EXPLORATORY LAPAROTOMY- SBO;  Surgeon: [name], MD;  Location: [facility] ORS;  Service: General;  Laterality: N/A;    Prior to  Admission medications   Medication Sig Start Date End Date Taking? Authorizing Provider  albuterol (PROVENTIL) (2.5 MG/3ML) 0.083% nebulizer solution Inhale 3 mLs into the lungs every 4 (four) hours as needed for wheezing or shortness of breath. 09/26/15   Katharina Caper, MD  aspirin EC 81 MG EC tablet Take 1 tablet (81 mg total) by mouth daily. 09/26/15   Katharina Caper, MD  furosemide (LASIX) 20 MG tablet Take 20 mg by mouth daily. 10/11/16   [provider]  mometasone-formoterol (DULERA) 200-5 MCG/ACT AERO Inhale 2 puffs into the lungs 2 (two) times daily. 09/26/15   Katharina Caper, MD  OXYGEN Inhale 2 L into the lungs.    [provider]    Allergies Patient has no known allergies.  Family History  Problem Relation Age of Onset  . COPD Mother     Social History Social History   Tobacco Use  . Smoking status: Former Smoker    Packs/day: 3.00    Years: 20.00    Pack years: 60.00    Types: Cigarettes    Quit date: 04/11/2016    Years since quitting: 2.7  . Smokeless tobacco: Never Used  Substance Use Topics  . Alcohol use: No    Comment: former drinker-1 pint of gin/week-quit 2015  . Drug use: Yes    Types: Marijuana    Comment: one joint /day x 2 y    Review of Systems  Review of Systems  Constitutional: Positive for fatigue. Negative for chills and  fever.  HENT: Negative for sore throat.   Respiratory: Positive for cough, shortness of breath and wheezing.   Cardiovascular: Negative for chest pain.  Gastrointestinal: Negative for abdominal pain.  Genitourinary: Negative for flank pain.  Musculoskeletal: Negative for neck pain.  Skin: Negative for rash and wound.  Allergic/Immunologic: Negative for immunocompromised state.  Neurological: Positive for weakness. Negative for numbness.  Hematological: Does not bruise/bleed easily.  All other systems reviewed and are negative.    ____________________________________________  PHYSICAL EXAM:      VITAL  SIGNS: ED Triage Vitals  Enc Vitals Group     BP      Pulse      Resp      Temp      Temp src      SpO2      Weight      Height      Head Circumference      Peak Flow      Pain Score      Pain Loc      Pain Edu?      Excl. in GC?      Physical Exam Vitals signs and nursing note reviewed.  Constitutional:      General: He is not in acute distress.    Appearance: He is well-developed. He is ill-appearing.     Comments: cachectic  HENT:     Head: Normocephalic and atraumatic.  Eyes:     Conjunctiva/sclera: Conjunctivae normal.  Neck:     Musculoskeletal: Neck supple.  Cardiovascular:     Rate and Rhythm: Normal rate and regular rhythm.     Heart sounds: Normal heart sounds. No murmur. No friction rub.  Pulmonary:     Effort: Pulmonary effort is normal. No respiratory distress.     Breath sounds: Examination of the right-upper field reveals decreased breath sounds and wheezing. Examination of the left-upper field reveals decreased breath sounds and wheezing. Examination of the right-middle field reveals decreased breath sounds and wheezing. Examination of the left-middle field reveals decreased breath sounds and wheezing. Examination of the right-lower field reveals decreased breath sounds and wheezing. Examination of the left-lower field reveals decreased breath sounds and wheezing. Decreased breath sounds and wheezing present. No rales.  Abdominal:     General: There is no distension.     Palpations: Abdomen is soft.     Tenderness: There is no abdominal tenderness.  Skin:    General: Skin is warm.     Capillary Refill: Capillary refill takes less than 2 seconds.  Neurological:     Mental Status: He is alert and oriented to person, place, and time.     Motor: No abnormal muscle tone.       ____________________________________________   LABS (all labs ordered are listed, but only abnormal results are displayed)  Labs Reviewed  BASIC METABOLIC PANEL - Abnormal;  Notable for the following components:      Result Value   Chloride 84 (*)    CO2 >50 (*)    Glucose, Bld 123 (*)    Creatinine, Ser 0.55 (*)    All other components within normal limits  BLOOD GAS, VENOUS - Abnormal; Notable for the following components:   pH, Ven 7.22 (*)    All other components within normal limits  SARS CORONAVIRUS 2 (TAT 6-24 HRS)  CULTURE, BLOOD (ROUTINE X 2)  CULTURE, BLOOD (ROUTINE X 2)  LACTIC ACID, PLASMA  CBC WITH DIFFERENTIAL/PLATELET  LACTIC ACID, PLASMA  PROCALCITONIN  TROPONIN  I (HIGH SENSITIVITY)    ____________________________________________  EKG: Normal sinus rhythm, VR 90. PR 175, QRS 81. Baseline wander in inferior leads, no acute St changes. ________________________________________  RADIOLOGY All imaging, including plain films, CT scans, and ultrasounds, independently reviewed by me, and interpretations confirmed via formal radiology reads.  ED MD interpretation:   CXR: RLL PNA, COPD  Official radiology report(s): Dg Chest Portable 1 View  Result Date: 01/24/2019 CLINICAL DATA:  Shortness of breath.  COVID-19 positive. EXAM: PORTABLE CHEST 1 VIEW COMPARISON:  Septae 2317 and 08/23/2015. FINDINGS: Midline trachea. Normal heart size. No pleural effusion or pneumothorax. Hyperinflation. Medial right lower lobe opacity. Diffuse peribronchial thickening. IMPRESSION: Medial right lower lobe airspace disease, most consistent with pneumonia. Hyperinflation and interstitial thickening, likely due to COPD/chronic bronchitis. Electronically Signed   By: Abigail Miyamoto M.D.   On: 01/24/2019 15:49    ____________________________________________  PROCEDURES   Procedure(s) performed (including Critical Care):  .Critical Care Performed by: Duffy Bruce, MD Authorized by: Duffy Bruce, MD   Critical care provider statement:    Critical care time (minutes):  35   Critical care time was exclusive of:  Separately billable procedures and  treating other patients and teaching time   Critical care was necessary to treat or prevent imminent or life-threatening deterioration of the following conditions:  Cardiac failure, circulatory failure and respiratory failure   Critical care was time spent personally by me on the following activities:  Development of treatment plan with patient or surrogate, discussions with consultants, evaluation of patient's response to treatment, examination of patient, obtaining history from patient or surrogate, ordering and performing treatments and interventions, ordering and review of laboratory studies, ordering and review of radiographic studies, pulse oximetry, re-evaluation of patient's condition and review of old charts   I assumed direction of critical care for this patient from another provider in my specialty: no      ____________________________________________  INITIAL IMPRESSION / MDM / [place] / ED COURSE  As part of my medical decision making, I reviewed the following data within the [place] notes reviewed and incorporated, Old chart reviewed, Notes from prior ED visits, and [place] Controlled Substance Database       *Chris Case was evaluated in Emergency Department on 01/24/2019 for the symptoms described in the history of present illness. He was evaluated in the context of the global COVID-19 pandemic, which necessitated consideration that the patient might be at risk for infection with the SARS-CoV-2 virus that causes COVID-19. Institutional protocols and algorithms that pertain to the evaluation of patients at risk for COVID-19 are in a state of rapid change based on information released by regulatory bodies including the CDC and federal and state organizations. These policies and algorithms were followed during the patient's care in the ED.  Some ED evaluations and interventions may be delayed as a result of limited staffing during the pandemic.*   Clinical Course as of Jan 24 1615  Sun Jan 24, 2019  1542 48 yo M here with cough, SOB, confusion. Suspect acute on chronic COPD exacerbation with failure to thrive. Will check VBG to assess pH/CO2 retention. Will start IV Steroids, CAT, and reassess. Likely will need admission. No fever, will fu CXR re: COVID risk. No focal lung findings though aeration markedly diminished on exam.   [CI]  1543 EKG is non-ischemic, denies any CP.   [CI]  1614 Blood gas shows acute on chronic hypercapneic resp failure, and BMP reveals CO2 >50  consistent with profound chronic resp acidosis. BIPAP started. He is mentating well.   [CI]    Clinical Course User Index [CI] Shaune PollackIsaacs, Nicolaos Mitrano, MD    Medical Decision Making:  As above. Acute on chronic hypoxic resp failure 2/2 COPD with RLL PNA.  ____________________________________________  FINAL CLINICAL IMPRESSION(S) / ED DIAGNOSES  Final diagnoses:  Acute on chronic respiratory failure with hypercapnia (HCC)  Pneumonia of right lower lobe due to infectious organism     MEDICATIONS GIVEN DURING THIS VISIT:  Medications  albuterol (PROVENTIL,VENTOLIN) solution continuous neb (has no administration in time range)  cefTRIAXone (ROCEPHIN) 2 g in sodium chloride 0.9 % 100 mL IVPB (has no administration in time range)  azithromycin (ZITHROMAX) 500 mg in sodium chloride 0.9 % 250 mL IVPB (has no administration in time range)  magnesium sulfate IVPB 2 g 50 mL (has no administration in time range)  methylPREDNISolone sodium succinate (SOLU-MEDROL) 125 mg/2 mL injection 125 mg (125 mg Intravenous Given 01/24/19 1557)  ipratropium (ATROVENT) nebulizer solution 1 mg (1 mg Nebulization Given 01/24/19 1607)  sodium chloride 0.9 % bolus 1,000 mL (1,000 mLs Intravenous New Bag/Given 01/24/19 1554)     ED Discharge Orders    None       Note:  This document was prepared using Dragon voice recognition software and may include unintentional dictation errors.    Shaune PollackIsaacs, Kenyette Gundy, MD 01/24/19 1616

## 2019-01-24 NOTE — ED Notes (Signed)
Attempted to call report, refused by Salley Scarlet.

## 2019-01-24 NOTE — ED Notes (Signed)
meds given  md at bedside for admission.  Pt alert   nsr on monitor.

## 2019-01-24 NOTE — ED Notes (Signed)
CT called for transport

## 2019-01-24 NOTE — ED Triage Notes (Addendum)
Per EMT report, [name] roommate called stating the patient refused food for 3 days. EMTs arrived and reports patient was 89% on home 3L of O2 and placed patient on 10L NRB. EMT report patient was confused initially on date, but then accurately responded to questions. Patient states he has felt short of breath for a few and has used nebulizer without relief. Patient is confused on time, states he has been here for two days.

## 2019-01-25 ENCOUNTER — Inpatient Hospital Stay: Payer: Medicaid Other

## 2019-01-25 DIAGNOSIS — J9621 Acute and chronic respiratory failure with hypoxia: Secondary | ICD-10-CM

## 2019-01-25 DIAGNOSIS — J441 Chronic obstructive pulmonary disease with (acute) exacerbation: Secondary | ICD-10-CM

## 2019-01-25 DIAGNOSIS — J9622 Acute and chronic respiratory failure with hypercapnia: Secondary | ICD-10-CM

## 2019-01-25 LAB — CBC WITH DIFFERENTIAL/PLATELET
Abs Immature Granulocytes: 0.02 10*3/uL (ref 0.00–0.07)
Basophils Absolute: 0 10*3/uL (ref 0.0–0.1)
Basophils Relative: 0 %
Eosinophils Absolute: 0 10*3/uL (ref 0.0–0.5)
Eosinophils Relative: 0 %
HCT: 32.9 % — ABNORMAL LOW (ref 39.0–52.0)
Hemoglobin: 9.8 g/dL — ABNORMAL LOW (ref 13.0–17.0)
Immature Granulocytes: 0 %
Lymphocytes Relative: 18 %
Lymphs Abs: 1 10*3/uL (ref 0.7–4.0)
MCH: 30.3 pg (ref 26.0–34.0)
MCHC: 29.8 g/dL — ABNORMAL LOW (ref 30.0–36.0)
MCV: 101.9 fL — ABNORMAL HIGH (ref 80.0–100.0)
Monocytes Absolute: 0.7 10*3/uL (ref 0.1–1.0)
Monocytes Relative: 13 %
Neutro Abs: 3.9 10*3/uL (ref 1.7–7.7)
Neutrophils Relative %: 69 %
Platelets: 93 10*3/uL — ABNORMAL LOW (ref 150–400)
RBC: 3.23 MIL/uL — ABNORMAL LOW (ref 4.22–5.81)
RDW: 11.9 % (ref 11.5–15.5)
Smear Review: NORMAL
WBC: 5.7 10*3/uL (ref 4.0–10.5)
nRBC: 0 % (ref 0.0–0.2)

## 2019-01-25 LAB — RESPIRATORY PANEL BY PCR

## 2019-01-25 LAB — CBC
HCT: 34.9 % — ABNORMAL LOW (ref 39.0–52.0)
Hemoglobin: 10.3 g/dL — ABNORMAL LOW (ref 13.0–17.0)
MCH: 30.1 pg (ref 26.0–34.0)
MCHC: 29.5 g/dL — ABNORMAL LOW (ref 30.0–36.0)
MCV: 102 fL — ABNORMAL HIGH (ref 80.0–100.0)
Platelets: 91 10*3/uL — ABNORMAL LOW (ref 150–400)
RBC: 3.42 MIL/uL — ABNORMAL LOW (ref 4.22–5.81)
RDW: 12.2 % (ref 11.5–15.5)
WBC: 5.6 10*3/uL (ref 4.0–10.5)
nRBC: 0 % (ref 0.0–0.2)

## 2019-01-25 LAB — BASIC METABOLIC PANEL
Anion gap: 10 (ref 5–15)
BUN: 14 mg/dL (ref 6–20)
CO2: 45 mmol/L — ABNORMAL HIGH (ref 22–32)
Calcium: 8.8 mg/dL — ABNORMAL LOW (ref 8.9–10.3)
Chloride: 86 mmol/L — ABNORMAL LOW (ref 98–111)
Creatinine, Ser: 0.47 mg/dL — ABNORMAL LOW (ref 0.61–1.24)
GFR calc Af Amer: >60 mL/min (ref >60)
GFR calc non Af Amer: >60 mL/min (ref >60)
Glucose, Bld: 116 mg/dL — ABNORMAL HIGH (ref 70–99)
Potassium: 4.9 mmol/L (ref 3.5–5.1)
Sodium: 141 mmol/L (ref 135–145)

## 2019-01-25 LAB — BLOOD GAS, ARTERIAL
Acid-Base Excess: 28.9 mmol/L — ABNORMAL HIGH (ref 0.0–2.0)
Acid-Base Excess: 28.3 mmol/L — ABNORMAL HIGH (ref 0.0–2.0)
Bicarbonate: 61.8 mmol/L — ABNORMAL HIGH (ref 20.0–28.0)
Bicarbonate: 62.5 mmol/L — ABNORMAL HIGH (ref 20.0–28.0)
Delivery systems: POSITIVE
Expiratory PAP: 8
FIO2: 0.35
FIO2: 0.28
Inspiratory PAP: 14
O2 Saturation: 60.1 %
O2 Saturation: 94.9 %
Patient temperature: 37
Patient temperature: 37
pCO2 arterial: 112 mmHg (ref 32.0–48.0)
pCO2 arterial: >120 mmHg (ref 32.0–48.0)
pH, Arterial: 7.35 (ref 7.350–7.450)
pH, Arterial: 7.3 — ABNORMAL LOW (ref 7.350–7.450)
pO2, Arterial: 82 mmHg — ABNORMAL LOW (ref 83.0–108.0)

## 2019-01-25 LAB — BLOOD GAS, VENOUS
Patient temperature: 37
pH, Ven: 7.22 — ABNORMAL LOW (ref 7.250–7.430)
pO2, Ven: 34 mmHg (ref 32.0–45.0)

## 2019-01-25 LAB — TECHNOLOGIST SMEAR REVIEW: Plt Morphology: NORMAL

## 2019-01-25 LAB — FOLATE: Folate: 10.2 ng/mL (ref >5.9)

## 2019-01-25 LAB — TYPE AND SCREEN
ABO/RH(D): O POS
Antibody Screen: NEGATIVE

## 2019-01-25 LAB — GLUCOSE, CAPILLARY: Glucose-Capillary: 115 mg/dL — ABNORMAL HIGH (ref 70–99)

## 2019-01-25 LAB — FIBRINOGEN: Fibrinogen: 302 mg/dL (ref 210–475)

## 2019-01-25 LAB — VITAMIN B12: Vitamin B-12: 326 pg/mL (ref 180–914)

## 2019-01-25 LAB — PROTIME-INR
INR: 1.1 (ref 0.8–1.2)
Prothrombin Time: 13.9 seconds (ref 11.4–15.2)

## 2019-01-25 LAB — ETHANOL: Alcohol, Ethyl (B): <10 mg/dL (ref <10)

## 2019-01-25 LAB — TSH: TSH: 0.063 u[IU]/mL — ABNORMAL LOW (ref 0.350–4.500)

## 2019-01-25 LAB — T4, FREE: Free T4: 0.66 ng/dL (ref 0.61–1.12)

## 2019-01-25 LAB — HIV ANTIBODY (ROUTINE TESTING W REFLEX): HIV Screen 4th Generation wRfx: NONREACTIVE

## 2019-01-25 MED ORDER — METHYLPREDNISOLONE SODIUM SUCC 40 MG IJ SOLR
40.0000 mg | Freq: Two times a day (BID) | INTRAMUSCULAR | Status: DC
  Administered 2019-01-25 – 2019-01-27 (×5): 40 mg via INTRAVENOUS
  Filled 2019-01-25 (×5): qty 1

## 2019-01-25 MED ORDER — BUDESONIDE 0.5 MG/2ML IN SUSP
0.5000 mg | Freq: Two times a day (BID) | RESPIRATORY_TRACT | Status: DC
  Administered 2019-01-25 – 2019-01-27 (×4): 0.5 mg via RESPIRATORY_TRACT
  Filled 2019-01-25 (×3): qty 2

## 2019-01-25 MED ORDER — CHLORHEXIDINE GLUCONATE CLOTH 2 % EX PADS
6.0000 | MEDICATED_PAD | Freq: Every day | CUTANEOUS | Status: DC
  Administered 2019-01-25 – 2019-01-26 (×2): 6 via TOPICAL

## 2019-01-25 MED ORDER — DEXTROMETHORPHAN POLISTIREX ER 30 MG/5ML PO SUER
30.0000 mg | Freq: Two times a day (BID) | ORAL | Status: DC
  Administered 2019-01-25 – 2019-01-27 (×5): 30 mg via ORAL
  Filled 2019-01-25 (×8): qty 5

## 2019-01-25 MED ORDER — MORPHINE SULFATE (PF) 2 MG/ML IV SOLN: 2.0000 mg | INTRAVENOUS | Status: DC | PRN

## 2019-01-25 MED ORDER — BENZONATATE 100 MG PO CAPS
100.0000 mg | ORAL_CAPSULE | Freq: Three times a day (TID) | ORAL | Status: DC
  Administered 2019-01-25 – 2019-01-27 (×6): 100 mg via ORAL
  Filled 2019-01-25 (×6): qty 1

## 2019-01-25 MED ORDER — DM-GUAIFENESIN ER 30-600 MG PO TB12: 1.0000 | ORAL_TABLET | Freq: Two times a day (BID) | ORAL | Status: DC

## 2019-01-25 MED ORDER — GUAIFENESIN ER 600 MG PO TB12
600.0000 mg | ORAL_TABLET | Freq: Two times a day (BID) | ORAL | Status: DC
  Administered 2019-01-25 – 2019-01-27 (×5): 600 mg via ORAL
  Filled 2019-01-25 (×5): qty 1

## 2019-01-25 MED ORDER — ENSURE ENLIVE PO LIQD
237.0000 mL | Freq: Two times a day (BID) | ORAL | Status: DC
  Administered 2019-01-26 – 2019-01-27 (×2): 237 mL via ORAL

## 2019-01-25 NOTE — Progress Notes (Signed)
-  Patient has severe COPD with chronic Resp failure. Patient needs NIV therapy, Patient  has a history of Life threatening hospitalizations resulting from her severe COPD -This patient requires access to ventilator therapy 24 hrs/day. Patient will need daily use at night and for the most part of the day -If patient does not receive noninvasive ventilator, patient will likely succumb to sever injury and/or death from increased levels of carbon dioxide    Patient continues to exhibit signs of hypercapnia associated with chronic respiratory failure secondary to severe diastolic heart failure.  Patient requires the use of non-invasive ventilation both nightly and daytime to help with exacerbation periods.    The use of the NIV will treat patient's high PCO2 levels and can reduce risk of exacerbations in future hospitalizations when used at night and during the day.  Patient will need these advanced settings in conjunction with her current medication regimen   BiPAP is not an option due to its functional limitations and the severity of the patient's condition.  Failure to have NIV available for use over 24-hour.  Could lead to cardiac arrest and death

## 2019-01-25 NOTE — Progress Notes (Signed)
Pt is on BIPAP so spirometry can not be performed.

## 2019-01-25 NOTE — Progress Notes (Signed)
[facility] Progress Note  Patient: Chris Case [id number]:[id number]   PCP: [facility] DOB: 11/19/1970   DOA: 01/24/2019   DOS: 01/25/2019   Date of Service: the patient was seen and examined on 01/25/2019  Chief Complaint  Patient presents with  . Shortness of Breath  . Failure To Thrive     Brief hospital course: Tevion Laforge is a 48 y.o. male with medical history significant of COPD on chronic 2L oxygen and failure to thrive who presents with concerns of weakness and shortness of breath. Reportedly patient's roommate called EMS because pt has been working harder to breath over the past day and becoming more confused. Also refused PO intake for 2-3 days. He was found to be hypoxic down to 80% oxygen saturation even on 2L by EMS and was placed on NRB. BMP showed profound respiratory acidosis with CO2>50 and was started on Bipap. ABG after an hour of BIPAP showed pH of 7.35, Pco2 OF 112. CBC showed no leukocytosis, mild anemia of 12.2, low platelet of 106. Lactate 0.7.  CXR shows possible right middle lobe pneumonia although procalcitonin was low. He was given continuous neb, IV solu-medrol, IV magnesium and started on IV Rocephin and Azithromcyin.   Currently further plan is continue further work-up for the confusion as well as treat respiratory distress..  Subjective: Patient did not have any acute complaint.  Denies any chest pain.  Denies any shortness of breath. Later in the afternoon become unresponsive and when aggressively sternal rub wake up with significant confusion.  ABG shows PCO2 of more than 120.  Patient was placed on BiPAP. On BiPAP patient becomes confused again.  Assessment and Plan: Scheduled Meds: . benzonatate  100 mg Oral TID  . budesonide (PULMICORT) nebulizer solution  0.5 mg Nebulization BID  . Chlorhexidine Gluconate Cloth  6 each Topical Daily  . dextromethorphan  30 mg Oral BID   And  . guaiFENesin  600 mg Oral BID  . feeding supplement  (ENSURE ENLIVE)  237 mL Oral BID BM  . ipratropium-albuterol  3 mL Nebulization Q4H  . methylPREDNISolone (SOLU-MEDROL) injection  40 mg Intravenous Q12H   Continuous Infusions: . azithromycin    . cefTRIAXone (ROCEPHIN)  IV 1 g (01/25/19 1500)   PRN Meds: albuterol, morphine injection  1.  Acute on chronic hypoxic respiratory failure. COPD exacerbation. Community-acquired right middle lobe pneumonia. Initially admitted to stepdown unit. Given that the patient actually has significant respiratory distress as well as hypercarbia patient's care currently was transferred to ICU. Appreciate their assistance. Patient will require and benefit from home ventilator therapy given his significant respiratory distress. Continue with steroids continue with antibiotic continue with nebulizer. COVID-19 test is negative.  2.  Thrombocytopenia. Etiology not clear production of initial further work-up.  3.  Anemia Etiology not clear likely from poor p.o. intake. Perform further work-up.  Diet: NPO on bipap DVT Prophylaxis: Subcutaneous Lovenox   Advance goals of care discussion: Full code  Family Communication: no family was present at bedside, at the time of interview.  Disposition:  Discharge to be determined .  Consultants: PCCM Dr. Belia Heman Procedures: bipap  Antibiotics: Anti-infectives (From admission, onward)   Start     Dose/Rate Route Frequency Ordered Stop   01/25/19 1800  azithromycin (ZITHROMAX) 500 mg in sodium chloride 0.9 % 250 mL IVPB     500 mg 250 mL/hr over 60 Minutes Intravenous Every 24 hours 01/24/19 1956 01/27/19 1759   01/25/19 1600  cefTRIAXone (ROCEPHIN) 1  g in sodium chloride 0.9 % 100 mL IVPB     1 g 200 mL/hr over 30 Minutes Intravenous Every 24 hours 01/24/19 1956     01/24/19 1600  cefTRIAXone (ROCEPHIN) 2 g in sodium chloride 0.9 % 100 mL IVPB     2 g 200 mL/hr over 30 Minutes Intravenous  Once 01/24/19 1554 01/24/19 1907   01/24/19 1600  azithromycin  (ZITHROMAX) 500 mg in sodium chloride 0.9 % 250 mL IVPB     500 mg 250 mL/hr over 60 Minutes Intravenous  Once 01/24/19 1554 01/24/19 2048       Objective: Physical Exam: Vitals:   01/25/19 1133 01/25/19 1138 01/25/19 1140 01/25/19 1400  BP: 128/78 127/74 113/69 115/69  Pulse:    91  Resp:    17  Temp:      TempSrc:      SpO2:    99%  Weight:      Height:        Intake/Output Summary (Last 24 hours) at 01/25/2019 1955 Last data filed at 01/25/2019 1300 Gross per 24 hour  Intake 615.17 ml  Output -  Net 615.17 ml   Filed Weights   01/24/19 1523  Weight: 61.9 kg   General: alert and oriented to time, place, and person. Appear in marked distress, affect appropriate Eyes: PERRL, Conjunctiva normal ENT: Oral Mucosa Clear, moist  Neck: no JVD, no Abnormal Mass Or lumps Cardiovascular: S1 and S2 Present, no Murmur,  Respiratory: increased respiratory effort, Bilateral Air entry equal and Decreased, no signs of accessory muscle use, bilateral  Crackles, no wheezes Abdomen: Bowel Sound present, Soft and no tenderness, no hernia Skin: no rashes  Extremities: no Pedal edema, no calf tenderness Neurologic: without any new focal findings Gait not checked due to patient safety concerns  Data Reviewed: I have personally reviewed and interpreted daily labs, tele strips, imagings as discussed above. I reviewed all nursing notes, pharmacy notes, vitals, pertinent old records I have discussed plan of care as described above with RN and patient/family.  CBC: Recent Labs  Lab 01/24/19 1511 01/25/19 0549 01/25/19 0805  WBC 7.3 5.6 5.7  NEUTROABS 5.0  --  3.9  HGB 12.2* 10.3* 9.8*  HCT 41.6 34.9* 32.9*  MCV 103.7* 102.0* 101.9*  PLT 106* 91* 93*   Basic Metabolic Panel: Recent Labs  Lab 01/24/19 1511 01/25/19 0549  NA 144 141  K 5.0 4.9  CL 84* 86*  CO2 >50* 45*  GLUCOSE 123* 116*  BUN 15 14  CREATININE 0.55* 0.47*  CALCIUM 9.1 8.8*    Liver Function Tests: No  results for input(s): AST, ALT, ALKPHOS, BILITOT, PROT, ALBUMIN in the last 168 hours. No results for input(s): LIPASE, AMYLASE in the last 168 hours. No results for input(s): AMMONIA in the last 168 hours. Coagulation Profile: Recent Labs  Lab 01/25/19 0805  INR 1.1   Cardiac Enzymes: No results for input(s): CKTOTAL, CKMB, CKMBINDEX, TROPONINI in the last 168 hours. BNP (last 3 results) No results for input(s): PROBNP in the last 8760 hours. CBG: Recent Labs  Lab 01/24/19 2155  GLUCAP 115*   Studies: Ct Head Wo Contrast  Result Date: 01/25/2019 CLINICAL DATA:  Altered level of consciousness. EXAM: CT HEAD WITHOUT CONTRAST TECHNIQUE: Contiguous axial images were obtained from the base of the skull through the vertex without intravenous contrast. COMPARISON:  None. FINDINGS: Brain: Ventricle size normal. Low lying cerebellar tonsils approximately 7 mm below the foramen magnum. Negative for infarct, hemorrhage, mass.  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 Electronically Signed   By: Marlan Palauharles  Clark M.D.   On: 01/25/2019 16:37     Time spent: 35 minutes  Author: Lynden OxfordPranav Aayden Cefalu, MD [facility] 01/25/2019 7:55 PM  To reach On-call, see care teams to locate the attending and reach out to them via [email or web address]. If 7PM-7AM, please contact night-coverage If you still have difficulty reaching the attending provider, please page the [facility] (Director on Call) for [facility] on amion for assistance.

## 2019-01-25 NOTE — Progress Notes (Signed)
Initial Nutrition Assessment  DOCUMENTATION CODES:   Not applicable  INTERVENTION:  Provide Ensure Enlive po BID, each supplement provides 350 kcal and 20 grams of protein.  NUTRITION DIAGNOSIS:   Increased nutrient needs related to catabolic illness(emphysema, COPD) as evidenced by estimated needs.  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   Met with patient at bedside. He is known to our service from a previous admission in 2018 for SBO where he met criteria for severe acute malnutrition and required TPN. Patient reports he was able to return to a regular diet and his appetite had returned to normal after that. Recently he has had a decreased appetite due to difficulty breathing. He was still attempting to eat 2-3 meals per day but was eating less than usual. Here his appetite is much better and he is finishing 100% of meals. He is still amenable to drinking ONS to help meet increased calorie/protein needs.  Patient reports he weighs around 135 lbs now and has been fairly weight-stable. According to chart he was 68 kg on 01/23/2018. He is currently 61.9 kg (136.5 lbs). He has lost 6.1 kg (9% body weight) over the past year, which is not significant for time frame.  Medications reviewed and include: Solu-Medrol 40 mg Q12hrs IV, azithromycin, ceftriaxone.  Labs reviewed: CBG 115, Chloride 86, CO2 45, Creatinine 0.47.  Patient does not meet criteria for malnutrition at this time but is at risk for malnutrition. Discussed his increased calorie/protein needs and encouraged adequate intake to prevent loss of lean body mass or development of  malnutrition.  NUTRITION - FOCUSED PHYSICAL EXAM:    Most Recent Value  Orbital Region  No depletion  Upper Arm Region  Mild depletion  Thoracic and Lumbar Region  No depletion  Buccal Region  No depletion  Temple Region  Mild depletion  Clavicle Bone Region  Mild depletion  Clavicle and Acromion Bone Region  No depletion  Scapular Bone Region  No depletion  Dorsal Hand  No depletion  Patellar Region  No depletion  Anterior Thigh Region  No depletion  Posterior Calf Region  Mild depletion  Edema (RD Assessment)  None  Hair  Reviewed  Eyes  Reviewed  Mouth  Reviewed  Skin  Reviewed  Nails  Reviewed     Diet Order:   Diet Order            Diet regular Room service appropriate? Yes; Fluid consistency: Thin  Diet effective now             EDUCATION NEEDS:   Education needs have been addressed  Skin:  Skin Assessment: Reviewed RN Assessment  Last BM:  01/24/2019 per chart  Height:   Ht Readings from Last 1 Encounters:  01/24/19 '5\' 6"'$  (1.676 m)   Weight:   Wt Readings from Last 1 Encounters:  01/24/19 61.9 kg   Ideal Body Weight:  64.5 kg  BMI:  Body mass index is 22.03 kg/m.  Estimated Nutritional Needs:   Kcal:  1800-2000  Protein:  90-100 grams  Fluid:  1.8-2 L/day  Jacklynn Barnacle, MS, RD, LDN Office: [phone] Pager: [phone] After Hours/Weekend Pager:  [phone] 

## 2019-01-25 NOTE — Consult Note (Signed)
Name: Chris Case MRN: [id number] DOB: 28-Feb-1971     CONSULTATION DATE: 01/24/2019  REFERRING MD : Allena Katz CHIEF COMPLAINT: resp failure    HISTORY OF PRESENT ILLNESS:   48 y.o. male with medical history significant of COPD on chronic 2L oxygen and failure to thrive  + weakness and shortness of breath +increased WOB and SOB +confusion +found to be hypoxic down to 80% oxygen saturation even on 2L by EMS and was placed on NRB ABG  profound respiratory acidosis with CO2>50 and was started on Bipap Severe resp acidosis on ABG  CXR RML opacity c/w pneumonia Now on biPAP, severe resp acidosis Placed on IV solumedrol On IV abx  Transferred to ICU for further management   PAST MEDICAL HISTORY :   has a past medical history of COPD (chronic obstructive pulmonary disease) (HCC), Emphysema lung (HCC), and 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  has a past surgical history that includes Appendectomy (late 1980's) and laparotomy (N/A, 12/06/2016). Prior to Admission medications   Medication Sig Start Date End Date Taking? Authorizing Provider  albuterol (PROVENTIL) (2.5 MG/3ML) 0.083% nebulizer solution Inhale 3 mLs into the lungs every 4 (four) hours as needed for wheezing or shortness of breath. 09/26/15  Yes Katharina Caper, MD  albuterol (VENTOLIN HFA) 108 (90 Base) MCG/ACT inhaler Inhale 2 puffs into the lungs every 4 (four) hours as needed for shortness of breath. 01/09/19  Yes [provider]  mometasone-formoterol (DULERA) 200-5 MCG/ACT AERO Inhale 2 puffs into the lungs 2 (two) times daily. 09/26/15  Yes Katharina Caper, MD  Tiotropium Bromide Monohydrate (SPIRIVA RESPIMAT) 1.25 MCG/ACT AERS Inhale 1 puff into the lungs daily.    Yes [provider]   No Known Allergies  FAMILY HISTORY:  family history includes COPD in his mother. SOCIAL HISTORY:  reports that he quit smoking about 2 years ago. His smoking use included cigarettes. He has a 60.00 pack-year smoking history. He  has never used smokeless tobacco. He reports current drug use. Drug: Marijuana. He reports that he does not drink alcohol.  REVIEW OF SYSTEMS:   Unable to obtain due to critical illness   PHYSICAL EXAMINATION:  GENERAL:critically ill appearing, +resp distress HEAD: Normocephalic, atraumatic.  EYES: Pupils equal, round, reactive to light.  No scleral icterus.  MOUTH: Moist mucosal membrane. NECK: Supple. No thyromegaly. No nodules. No JVD.  PULMONARY: +rhonchi, +wheezing CARDIOVASCULAR: S1 and S2. Regular rate and rhythm. No murmurs, rubs, or gallops.  GASTROINTESTINAL: Soft, nontender, -distended. Positive bowel sounds.  MUSCULOSKELETAL: No swelling, clubbing, or edema.  NEUROLOGIC: lethargic but arousable SKIN:intact,warm,dry     VITAL SIGNS: Temp:  [97.8 F (36.6 C)-98 F (36.7 C)] 98 F (36.7 C) (11/23 0300) Pulse Rate:  [79-91] 91 (11/23 1400) Resp:  [16-21] 17 (11/23 1400) BP: (103-128)/(65-87) 115/69 (11/23 1400) SpO2:  [95 %-100 %] 99 % (11/23 1400)   I/O last 3 completed shifts: In: 495.2 [I.V.:495.2] Out: -  Total I/O In: 120 [P.O.:120] Out: -    SpO2: 99 % O2 Flow Rate (L/min): 3 L/min FiO2 (%): 35 %     I personally reviewed lab work that was obtained in last 24 hrs. CXR Independently reviewed-RML opacity  MEDICATIONS: I have reviewed all medications and confirmed regimen as documented   CULTURE RESULTS   Recent Results (from the past 240 hour(s))  SARS CORONAVIRUS 2 (TAT 6-24 HRS) Nasopharyngeal Nasopharyngeal Swab     Status: None   Collection Time: 01/24/19  3:59 PM   Specimen: Nasopharyngeal Swab  Result Value Ref Range Status   SARS Coronavirus 2 NEGATIVE NEGATIVE Final    Comment: (NOTE) SARS-CoV-2 target nucleic acids are NOT DETECTED. The SARS-CoV-2 RNA is generally detectable in upper and lower respiratory specimens during the acute phase of infection. Negative results do not preclude SARS-CoV-2 infection, do not rule out  co-infections with other pathogens, and should not be used as the sole basis for treatment or other patient management decisions. Negative results must be combined with clinical observations, patient history, and epidemiological information. The expected result is Negative. Fact Sheet for Patients: SugarRoll.be Fact Sheet for Healthcare Providers: https://www.woods-mathews.com/ This test is not yet approved or cleared by the [place] FDA and  has been authorized for detection and/or diagnosis of SARS-CoV-2 by FDA under an Emergency Use Authorization (EUA). This EUA will remain  in effect (meaning this test can be used) for the duration of the COVID-19 declaration under Section 56 4(b)(1) of the Act, 21 U.S.C. section 360bbb-3(b)(1), unless the authorization is terminated or revoked sooner. Performed at [facility], [place] [name], [place], [place] [address]   Blood culture (routine x 2)     Status: None (Preliminary result)   Collection Time: 01/24/19  4:47 PM   Specimen: BLOOD  Result Value Ref Range Status   Specimen Description BLOOD RIGHT ANTECUBITAL  Final   Special Requests   Final    BOTTLES DRAWN AEROBIC AND ANAEROBIC Blood Culture adequate volume   Culture   Final    NO GROWTH < 24 HOURS Performed at [facility], [address] [place], [place] [address]    Report Status PENDING  Incomplete  Blood culture (routine x 2)     Status: None (Preliminary result)   Collection Time: 01/24/19  5:13 PM   Specimen: BLOOD  Result Value Ref Range Status   Specimen Description BLOOD LEFT ANTECUBITAL  Final   Special Requests   Final    BOTTLES DRAWN AEROBIC AND ANAEROBIC Blood Culture adequate volume   Culture   Final    NO GROWTH < 24 HOURS Performed at [facility], [address] [place], [place] [address]    Report Status PENDING  Incomplete  Respiratory Panel by PCR     Status: None   Collection  Time: 01/24/19 11:08 PM   Specimen: Nasopharyngeal Swab; Respiratory  Result Value Ref Range Status   Adenovirus NOT DETECTED NOT DETECTED Final   Coronavirus 229E NOT DETECTED NOT DETECTED Final    Comment: (NOTE) The Coronavirus on the Respiratory Panel, DOES NOT test for the novel  Coronavirus (2019 nCoV)    Coronavirus HKU1 NOT DETECTED NOT DETECTED Final   Coronavirus NL63 NOT DETECTED NOT DETECTED Final   Coronavirus OC43 NOT DETECTED NOT DETECTED Final   Metapneumovirus NOT DETECTED NOT DETECTED Final   Rhinovirus / Enterovirus NOT DETECTED NOT DETECTED Final   Influenza A NOT DETECTED NOT DETECTED Final   Influenza B NOT DETECTED NOT DETECTED Final   Parainfluenza Virus 1 NOT DETECTED NOT DETECTED Final   Parainfluenza Virus 2 NOT DETECTED NOT DETECTED Final   Parainfluenza Virus 3 NOT DETECTED NOT DETECTED Final   Parainfluenza Virus 4 NOT DETECTED NOT DETECTED Final   Respiratory Syncytial Virus NOT DETECTED NOT DETECTED Final   Bordetella pertussis NOT DETECTED NOT DETECTED Final   Chlamydophila pneumoniae NOT DETECTED NOT DETECTED Final   Mycoplasma pneumoniae NOT DETECTED NOT DETECTED Final    Comment: Performed at [facility], [place]  [address] [place], [place] [address]           ASSESSMENT AND PLAN SYNOPSIS   Severe ACUTE Hypoxic and Hypercapnic Respiratory Failure from COPD exacerbation from RML pneumonia On biPAP -continue Bronchodilator Therapy   SEVERE COPD EXACERBATION -continue IV steroids as prescribed -continue NEB THERAPY as prescribed -morphine as needed -wean fio2 as needed and tolerated   SOB Check ECHO Check UDS   CARDIAC ICU monitoring  ID -continue IV abx as prescibed -follow up cultures  GI GI PROPHYLAXIS as indicated  NUTRITIONAL STATUS DIET-->NPO Constipation protocol as indicated   ENDO - will use ICU hypoglycemic\Hyperglycemia protocol if needed    ELECTROLYTES -follow labs as needed -replace as needed  -pharmacy consultation and following   DVT/GI PRX ordered TRANSFUSIONS AS NEEDED MONITOR FSBS ASSESS the need for LABS    Critical Care Time devoted to patient care services described in this note is 32 minutes.   Overall, patient is critically ill, prognosis is guarded.  High risk for intubation   Lucie LeatherKurian David Calin Ellery, M.D.  [facility]  Medical Director [facility] Medical Director [facility] Cardio-Pulmonary Department

## 2019-01-26 LAB — CBC WITH DIFFERENTIAL/PLATELET
Abs Immature Granulocytes: 0.03 10*3/uL (ref 0.00–0.07)
Basophils Absolute: 0 10*3/uL (ref 0.0–0.1)
Basophils Relative: 0 %
Eosinophils Absolute: 0 10*3/uL (ref 0.0–0.5)
Eosinophils Relative: 0 %
HCT: 31 % — ABNORMAL LOW (ref 39.0–52.0)
Hemoglobin: 9.6 g/dL — ABNORMAL LOW (ref 13.0–17.0)
Immature Granulocytes: 1 %
Lymphocytes Relative: 15 %
Lymphs Abs: 1 10*3/uL (ref 0.7–4.0)
MCH: 30.3 pg (ref 26.0–34.0)
MCHC: 31 g/dL (ref 30.0–36.0)
MCV: 97.8 fL (ref 80.0–100.0)
Monocytes Absolute: 0.5 10*3/uL (ref 0.1–1.0)
Monocytes Relative: 8 %
Neutro Abs: 4.7 10*3/uL (ref 1.7–7.7)
Neutrophils Relative %: 76 %
Platelets: 90 10*3/uL — ABNORMAL LOW (ref 150–400)
RBC: 3.17 MIL/uL — ABNORMAL LOW (ref 4.22–5.81)
RDW: 12.2 % (ref 11.5–15.5)
WBC: 6.2 10*3/uL (ref 4.0–10.5)
nRBC: 0 % (ref 0.0–0.2)

## 2019-01-26 LAB — HIV ANTIBODY (ROUTINE TESTING W REFLEX): HIV Screen 4th Generation wRfx: NONREACTIVE

## 2019-01-26 LAB — BASIC METABOLIC PANEL
Anion gap: 9 (ref 5–15)
BUN: 16 mg/dL (ref 6–20)
CO2: 41 mmol/L — ABNORMAL HIGH (ref 22–32)
Calcium: 9 mg/dL (ref 8.9–10.3)
Chloride: 89 mmol/L — ABNORMAL LOW (ref 98–111)
Creatinine, Ser: 0.49 mg/dL — ABNORMAL LOW (ref 0.61–1.24)
GFR calc Af Amer: >60 mL/min (ref >60)
GFR calc non Af Amer: >60 mL/min (ref >60)
Glucose, Bld: 119 mg/dL — ABNORMAL HIGH (ref 70–99)
Potassium: 4.5 mmol/L (ref 3.5–5.1)
Sodium: 139 mmol/L (ref 135–145)

## 2019-01-26 LAB — MRSA PCR SCREENING: MRSA by PCR: NEGATIVE

## 2019-01-26 MED ORDER — SODIUM CHLORIDE 0.9 % IV SOLN
2.0000 g | INTRAVENOUS | Status: DC
  Administered 2019-01-26: 2 g via INTRAVENOUS
  Filled 2019-01-26: qty 20
  Filled 2019-01-26: qty 2

## 2019-01-26 NOTE — Clinical Social Work Note (Signed)
Notified [employer] representative that, per MD, patient will need a Trilogy at discharge.  Dayton Scrape, [place]

## 2019-01-26 NOTE — Progress Notes (Signed)
Ch visited pt while rounding in ICU. Pt is a 48 y.o. male that has a hx of emphysema, COPD, and O2 dependency. Pt shared that he was brought in with the help of his roommate that called for an EMS due to the lack of responsiveness from pt. Pt shared that he is being treated for pneumonia that he think he has had for a few weeks. Ch provided space for pt to lament about his recent health changes and how he has transitioned from living in his truck to now living with his current roommate. Pt is mobile with the help of both large and portable O2 tanks and still drives. Pt shared that he has been tolerating food well without any major issues. Ch provided a compassionate presence and active listening as the pt shard that his mother had a hx of emphysema, substance abuse/smoking, COPD, and a heart murmur. Pt reports that he has not smoked in  ~3 years and takes a regular dose of OTC Mucenix. Ch prayed for pt to sustain his health.  No further needs at this time.    01/26/19 1200  Clinical Encounter Type  Visited With Patient  Visit Type Spiritual support;Social support  Spiritual Encounters  Spiritual Needs Prayer;Emotional;Grief support  Stress Factors  Patient Stress Factors Exhausted;Health changes;Loss of control;Major life changes  Family Stress Factors None identified

## 2019-01-26 NOTE — Progress Notes (Signed)
SYNOPSIS COPD exacerbation  CC  Follow up resp failure   HPI Remains on biPAP Less WOB Wean off biPAP as tolerated      VITALS: BP 113/84   Pulse 89   Temp 97.9 F (36.6 C) (Axillary)   Resp (!) 22   Ht 5\' 6"  (1.676 m)   Wt 61.9 kg   SpO2 97%   BMI 22.03 kg/m     I/O last 3 completed shifts: In: 615.2 [P.O.:120; I.V.:495.2] Out: 500 [Urine:500] No intake/output data recorded.  SpO2: 97 % O2 Flow Rate (L/min): 4 L/min FiO2 (%): 28 %   Review of Systems:  Gen:  Denies  fever, sweats, chills weigh loss  HEENT: Denies blurred vision, double vision, ear pain, eye pain, hearing loss, nose bleeds, sore throat Cardiac:  No dizziness, chest pain or heaviness, chest tightness,edema, No JVD Resp:   No cough, -sputum production, +shortness of breath,-wheezing, -hemoptysis,  Other:  All other systems negative  Physical Examination:   GENERAL:ill appearing HEAD: Normocephalic, atraumatic.  EYES: PERLA, EOMI No scleral icterus.  NECK: Supple.  PULMONARY: CTA B/L no wheezing, rhonchi, crackles CARDIOVASCULAR: S1 and S2. Regular rate and rhythm. No murmurs GASTROINTESTINAL: Soft, nontender, nondistended. Positive bowel sounds.  MUSCULOSKELETAL: No swelling, clubbing, or edema.  NEUROLOGIC: No gross focal neurological deficits. 5/5 strength all extremities SKIN: No ulceration, lesions, rashes, or cyanosis.  PSYCHIATRIC: Insight, judgment intact. -depression -anxiety ALL OTHER ROS ARE NEGATIVE    I personally reviewed Labs under Results section.   MEDICATIONS: I have reviewed all medications and confirmed regimen as documented   CULTURE RESULTS   Recent Results (from the past 240 hour(s))  SARS CORONAVIRUS 2 (TAT 6-24 HRS) Nasopharyngeal Nasopharyngeal Swab     Status: None   Collection Time: 01/24/19  3:59 PM   Specimen: Nasopharyngeal Swab  Result Value Ref Range Status   SARS Coronavirus 2 NEGATIVE NEGATIVE Final    Comment: (NOTE) SARS-CoV-2 target  nucleic acids are NOT DETECTED. The SARS-CoV-2 RNA is generally detectable in upper and lower respiratory specimens during the acute phase of infection. Negative results do not preclude SARS-CoV-2 infection, do not rule out co-infections with other pathogens, and should not be used as the sole basis for treatment or other patient management decisions. Negative results must be combined with clinical observations, patient history, and epidemiological information. The expected result is Negative. Fact Sheet for Patients: 01/26/19 Fact Sheet for Healthcare Providers: HairSlick.no This test is not yet approved or cleared by the [place] FDA and  has been authorized for detection and/or diagnosis of SARS-CoV-2 by FDA under an Emergency Use Authorization (EUA). This EUA will remain  in effect (meaning this test can be used) for the duration of the COVID-19 declaration under Section 56 4(b)(1) of the Act, 21 U.S.C. section 360bbb-3(b)(1), unless the authorization is terminated or revoked sooner. Performed at [facility], [address], [place], [place]   Blood culture (routine x 2)     Status: None (Preliminary result)   Collection Time: 01/24/19  4:47 PM   Specimen: BLOOD  Result Value Ref Range Status   Specimen Description BLOOD RIGHT ANTECUBITAL  Final   Special Requests   Final    BOTTLES DRAWN AEROBIC AND ANAEROBIC Blood Culture adequate volume   Culture   Final    NO GROWTH < 24 HOURS Performed at [facility], [address], [place], [place]    Report Status PENDING  Incomplete  Blood culture (routine  x 2)     Status: None (Preliminary result)   Collection Time: 01/24/19  5:13 PM   Specimen: BLOOD  Result Value Ref Range Status   Specimen Description BLOOD LEFT ANTECUBITAL  Final   Special Requests   Final    BOTTLES DRAWN AEROBIC AND ANAEROBIC Blood Culture adequate  volume   Culture   Final    NO GROWTH < 24 HOURS Performed at [facility], [address] [place], [place] [address]    Report Status PENDING  Incomplete  Respiratory Panel by PCR     Status: None   Collection Time: 01/24/19 11:08 PM   Specimen: Nasopharyngeal Swab; Respiratory  Result Value Ref Range Status   Adenovirus NOT DETECTED NOT DETECTED Final   Coronavirus 229E NOT DETECTED NOT DETECTED Final    Comment: (NOTE) The Coronavirus on the Respiratory Panel, DOES NOT test for the novel  Coronavirus (2019 nCoV)    Coronavirus HKU1 NOT DETECTED NOT DETECTED Final   Coronavirus NL63 NOT DETECTED NOT DETECTED Final   Coronavirus OC43 NOT DETECTED NOT DETECTED Final   Metapneumovirus NOT DETECTED NOT DETECTED Final   Rhinovirus / Enterovirus NOT DETECTED NOT DETECTED Final   Influenza A NOT DETECTED NOT DETECTED Final   Influenza B NOT DETECTED NOT DETECTED Final   Parainfluenza Virus 1 NOT DETECTED NOT DETECTED Final   Parainfluenza Virus 2 NOT DETECTED NOT DETECTED Final   Parainfluenza Virus 3 NOT DETECTED NOT DETECTED Final   Parainfluenza Virus 4 NOT DETECTED NOT DETECTED Final   Respiratory Syncytial Virus NOT DETECTED NOT DETECTED Final   Bordetella pertussis NOT DETECTED NOT DETECTED Final   Chlamydophila pneumoniae NOT DETECTED NOT DETECTED Final   Mycoplasma pneumoniae NOT DETECTED NOT DETECTED Final    Comment: Performed at [facility], [place] [address] [place], [place] [address]          IMAGING    Ct Head Wo Contrast  Result Date: 01/25/2019 CLINICAL DATA:  Altered level of consciousness. EXAM: CT HEAD WITHOUT CONTRAST TECHNIQUE: Contiguous axial images were obtained from the base of the skull through the vertex without intravenous contrast. COMPARISON:  None. FINDINGS: Brain: Ventricle size normal. Low lying cerebellar tonsils approximately 7 mm below the foramen magnum. Negative for infarct, hemorrhage, mass. 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 Electronically Signed   By: Franchot Gallo M.D.   On: 01/25/2019 16:37        ASSESSMENT AND PLAN SYNOPSIS  Severe ACUTE Hypoxic and Hypercapnic Respiratory Failure Wean off biPAP Oxygen as needed  SEVERE COPD EXACERBATION -continue IV steroids as prescribed -continue NEB THERAPY as prescribed  ELECTROLYTES -follow labs as needed -replace as needed -pharmacy consultation and following   DVT/GI PRX ordered TRANSFUSIONS AS NEEDED MONITOR FSBS ASSESS the need for LABS as needed   Remains Step down status   Corrin Parker, M.D.  [facility]  Medical Director [place] Director [facility] Cardio-Pulmonary Department

## 2019-01-27 LAB — CBC WITH DIFFERENTIAL/PLATELET
Abs Immature Granulocytes: 0.03 10*3/uL (ref 0.00–0.07)
Basophils Absolute: 0 10*3/uL (ref 0.0–0.1)
Basophils Relative: 0 %
Eosinophils Absolute: 0 10*3/uL (ref 0.0–0.5)
Eosinophils Relative: 0 %
HCT: 34.3 % — ABNORMAL LOW (ref 39.0–52.0)
Hemoglobin: 10.2 g/dL — ABNORMAL LOW (ref 13.0–17.0)
Immature Granulocytes: 0 %
Lymphocytes Relative: 17 %
Lymphs Abs: 1.2 10*3/uL (ref 0.7–4.0)
MCH: 30.6 pg (ref 26.0–34.0)
MCHC: 29.7 g/dL — ABNORMAL LOW (ref 30.0–36.0)
MCV: 103 fL — ABNORMAL HIGH (ref 80.0–100.0)
Monocytes Absolute: 0.8 10*3/uL (ref 0.1–1.0)
Monocytes Relative: 11 %
Neutro Abs: 5 10*3/uL (ref 1.7–7.7)
Neutrophils Relative %: 72 %
Platelets: 105 10*3/uL — ABNORMAL LOW (ref 150–400)
RBC: 3.33 MIL/uL — ABNORMAL LOW (ref 4.22–5.81)
RDW: 12.4 % (ref 11.5–15.5)
WBC: 7 10*3/uL (ref 4.0–10.5)
nRBC: 0 % (ref 0.0–0.2)

## 2019-01-27 LAB — BASIC METABOLIC PANEL WITH GFR
Anion gap: 7 (ref 5–15)
BUN: 12 mg/dL (ref 6–20)
CO2: 44 mmol/L — ABNORMAL HIGH (ref 22–32)
Calcium: 8.8 mg/dL — ABNORMAL LOW (ref 8.9–10.3)
Chloride: 89 mmol/L — ABNORMAL LOW (ref 98–111)
Creatinine, Ser: 0.49 mg/dL — ABNORMAL LOW (ref 0.61–1.24)
GFR calc Af Amer: >60 mL/min
GFR calc non Af Amer: >60 mL/min
Glucose, Bld: 112 mg/dL — ABNORMAL HIGH (ref 70–99)
Potassium: 4.4 mmol/L (ref 3.5–5.1)
Sodium: 140 mmol/L (ref 135–145)

## 2019-01-27 MED ORDER — AMOXICILLIN-POT CLAVULANATE 875-125 MG PO TABS: 1.0000 | ORAL_TABLET | Freq: Two times a day (BID) | ORAL | 0 refills | Status: AC

## 2019-01-27 MED ORDER — PREDNISONE 20 MG PO TABS: 40.0000 mg | ORAL_TABLET | Freq: Every day | ORAL | 0 refills | Status: DC

## 2019-01-27 MED ORDER — GUAIFENESIN ER 600 MG PO TB12: 600.0000 mg | ORAL_TABLET | Freq: Two times a day (BID) | ORAL | 3 refills | Status: DC

## 2019-01-27 NOTE — Clinical Social Work Note (Addendum)
Sent signed order to [employer] representative. He is aware of discharge today.  Dayton Scrape, [place]  1:03 pm Trilogy will be delivered to patient's home. The person picking patient up cannot bring his oxygen tank. [employer] representative will bring one to the room for him to take home. No further concerns.  CSW signing off.  Dayton Scrape, [place]

## 2019-01-27 NOTE — Progress Notes (Addendum)
SYNOPSIS COPD exacerbation  CC  Follow up COPD exacerbation   HPI Off biPAP Resting comfortably minimal oxygen will need to assess  Trilogy NIV   -Patient has severe COPD with chronic Resp failure. Patient needs NIV therapy, Patient  has a history of Life threatening hospitalizations resulting from her severe COPD -This patient requires access to ventilator therapy 24 hrs/day. Patient will need daily use at night and for the most part of the day -If patient does not receive noninvasive ventilator, patient will likely succumb to sever injury and/or death from increased levels of carbon dioxide    Patient continues to exhibit signs of hypercapnia associated with chronic respiratory failure secondary to severe diastolic heart failure.  Patient requires the use of non-invasive ventilation both nightly and daytime to help with exacerbation periods.    The use of the NIV will treat patient's high PCO2 levels and can reduce risk of exacerbations in future hospitalizations when used at night and during the day.  Patient will need these advanced settings in conjunction with her current medication regimen.  Patient can maintain and clear their own airway.    BiPAP is not an option due to its functional limitations and the severity of the patient's condition.  Failure to have NIV available for use over 24-hour.  Could lead to cardiac arrest and death         VITALS: BP 108/69   Pulse 82   Temp 98.8 F (37.1 C) (Oral)   Resp 18   Ht 5\' 6"  (1.676 m)   Wt 61.9 kg   SpO2 98%   BMI 22.03 kg/m     I/O last 3 completed shifts: In: 830.1 [P.O.:480; IV Piggyback:350.1] Out: 2600 [Urine:2600] No intake/output data recorded.  SpO2: 98 % O2 Flow Rate (L/min): 3 L/min FiO2 (%): 28 %    Review of Systems:  Gen:  Denies  fever, sweats, chills weight loss  HEENT: Denies blurred vision, double vision, ear pain, eye pain, hearing loss, nose bleeds, sore throat Cardiac:  No dizziness,  chest pain or heaviness, chest tightness,edema, No JVD Resp:   No cough, -sputum production, +shortness of breath,-wheezing, -hemoptysis,  Gi: Denies swallowing difficulty, stomach pain, nausea or vomiting, diarrhea, constipation, bowel incontinence Gu:  Denies bladder incontinence, burning urine Ext:   Denies Joint pain, stiffness or swelling Skin: Denies  skin rash, easy bruising or bleeding or hives Endoc:  Denies polyuria, polydipsia , polyphagia or weight change Psych:   Denies depression, insomnia or hallucinations  Other:  All other systems negative  Physical Examination:   GENERAL:NAD, no fevers, chills, no weakness no fatigue HEAD: Normocephalic, atraumatic.  EYES: PERLA, EOMI No scleral icterus.  NECK: Supple. No thyromegaly.  No JVD.  PULMONARY: CTA B/L no wheezing, rhonchi, crackles CARDIOVASCULAR: S1 and S2. Regular rate and rhythm. No murmurs GASTROINTESTINAL: Soft, nontender, nondistended. Positive bowel sounds.  MUSCULOSKELETAL: No swelling, clubbing, or edema.  NEUROLOGIC: No gross focal neurological deficits. 5/5 strength all extremities SKIN: No ulceration, lesions, rashes, or cyanosis.  PSYCHIATRIC: Insight, judgment intact. -depression -anxiety ALL OTHER ROS ARE NEGATIVE       I personally reviewed Labs under Results section.   MEDICATIONS: I have reviewed all medications and confirmed regimen as documented   CULTURE RESULTS   Recent Results (from the past 240 hour(s))  SARS CORONAVIRUS 2 (TAT 6-24 HRS) Nasopharyngeal Nasopharyngeal Swab     Status: None   Collection Time: 01/24/19  3:59 PM   Specimen: Nasopharyngeal Swab  Result Value  Ref Range Status   SARS Coronavirus 2 NEGATIVE NEGATIVE Final    Comment: (NOTE) SARS-CoV-2 target nucleic acids are NOT DETECTED. The SARS-CoV-2 RNA is generally detectable in upper and lower respiratory specimens during the acute phase of infection. Negative results do not preclude SARS-CoV-2 infection, do not rule  out co-infections with other pathogens, and should not be used as the sole basis for treatment or other patient management decisions. Negative results must be combined with clinical observations, patient history, and epidemiological information. The expected result is Negative. Fact Sheet for Patients: HairSlick.nohttps://www.fda.gov/media/138098/download Fact Sheet for Healthcare Providers: quierodirigir.comhttps://www.fda.gov/media/138095/download This test is not yet approved or cleared by the [place] FDA and  has been authorized for detection and/or diagnosis of SARS-CoV-2 by FDA under an Emergency Use Authorization (EUA). This EUA will remain  in effect (meaning this test can be used) for the duration of the COVID-19 declaration under Section 56 4(b)(1) of the Act, 21 U.S.C. section 360bbb-3(b)(1), unless the authorization is terminated or revoked sooner. Performed at [facility], [address] [place], [place] [address]   Blood culture (routine x 2)     Status: None (Preliminary result)   Collection Time: 01/24/19  4:47 PM   Specimen: BLOOD  Result Value Ref Range Status   Specimen Description BLOOD RIGHT ANTECUBITAL  Final   Special Requests   Final    BOTTLES DRAWN AEROBIC AND ANAEROBIC Blood Culture adequate volume   Culture   Final    NO GROWTH 3 DAYS Performed at [facility], [address] [place], [place] [address]    Report Status PENDING  Incomplete  Blood culture (routine x 2)     Status: None (Preliminary result)   Collection Time: 01/24/19  5:13 PM   Specimen: BLOOD  Result Value Ref Range Status   Specimen Description BLOOD LEFT ANTECUBITAL  Final   Special Requests   Final    BOTTLES DRAWN AEROBIC AND ANAEROBIC Blood Culture adequate volume   Culture   Final    NO GROWTH 3 DAYS Performed at [facility], [address] [place], [place] [address]    Report Status PENDING  Incomplete  Respiratory Panel by PCR     Status: None   Collection Time:  01/24/19 11:08 PM   Specimen: Nasopharyngeal Swab; Respiratory  Result Value Ref Range Status   Adenovirus NOT DETECTED NOT DETECTED Final   Coronavirus 229E NOT DETECTED NOT DETECTED Final    Comment: (NOTE) The Coronavirus on the Respiratory Panel, DOES NOT test for the novel  Coronavirus (2019 nCoV)    Coronavirus HKU1 NOT DETECTED NOT DETECTED Final   Coronavirus NL63 NOT DETECTED NOT DETECTED Final   Coronavirus OC43 NOT DETECTED NOT DETECTED Final   Metapneumovirus NOT DETECTED NOT DETECTED Final   Rhinovirus / Enterovirus NOT DETECTED NOT DETECTED Final   Influenza A NOT DETECTED NOT DETECTED Final   Influenza B NOT DETECTED NOT DETECTED Final   Parainfluenza Virus 1 NOT DETECTED NOT DETECTED Final   Parainfluenza Virus 2 NOT DETECTED NOT DETECTED Final   Parainfluenza Virus 3 NOT DETECTED NOT DETECTED Final   Parainfluenza Virus 4 NOT DETECTED NOT DETECTED Final   Respiratory Syncytial Virus NOT DETECTED NOT DETECTED Final   Bordetella pertussis NOT DETECTED NOT DETECTED Final   Chlamydophila pneumoniae NOT DETECTED NOT DETECTED Final   Mycoplasma pneumoniae NOT DETECTED NOT DETECTED Final    Comment: Performed at [facility], [address] [place], [place] [address]  MRSA PCR Screening     Status: None   Collection Time: 01/26/19  9:33 AM   Specimen: Nasal Mucosa; Nasopharyngeal  Result Value Ref Range Status   MRSA by PCR NEGATIVE NEGATIVE Final    Comment:        The GeneXpert MRSA Assay (FDA approved for NASAL specimens only), is one component of a comprehensive MRSA colonization surveillance program. It is not intended to diagnose MRSA infection nor to guide or monitor treatment for MRSA infections. Performed at [facility], [address] [place], [place] [address]           ASSESSMENT AND PLAN SYNOPSIS  SEVERE COPD EXACERBATION -continue steroids as prescribed -continue NEB THERAPY as prescribed -morphine as needed -wean fio2  as needed and tolerated   Severe ACUTE Hypoxic and Hypercapnic Respiratory Failure slowly resolving Plan to discharge home with Trilogy vent   ELECTROLYTES -follow labs as needed -replace as needed -pharmacy consultation and following   DVT/GI PRX ordered TRANSFUSIONS AS NEEDED MONITOR FSBS ASSESS the need for LABS as needed   Assess for d/c home  Kamber Vignola Santiago Glad, M.D.  [facility]  Medical Director [facility] Medical Director [facility] Cardio-Pulmonary Department

## 2019-01-27 NOTE — Progress Notes (Signed)
Oxygen delivered and discharge instructions reviewed with patient. No questions. Patients room mate on the way to pick up. Lupita Leash

## 2019-01-27 NOTE — Discharge Summary (Signed)
Physician Discharge Summary  Patient ID: Chris Case MRN: [id number] DOB/AGE: September 08, 1970 48 y.o.  Admit date: 01/24/2019 Discharge date: 01/27/2019  Admission Diagnoses:COPD exacerbation RML pneumonia  Discharge Diagnoses:  Principal Problem:   Acute on chronic respiratory failure with hypoxia and hypercapnia (HCC) Active Problems:   COPD exacerbation (HCC)   Protein-calorie malnutrition, severe   Thrombocytopenia ([place])   Discharged Condition: stable  Hospital Course:  Admitted for COPD exacerbation and RML pneumonia Patient significantly improved with therapy COVID 19 NEG   Consults: PULMONARY  Significant Diagnostic Studies: CXR  Treatments: IV abx IV steroids NEB therapy Oxygen therapy and biPAP  Discharge Exam: Blood pressure 110/78, pulse 83, temperature (!) 97.5 F (36.4 C), temperature source Oral, resp. rate 20, height 5\' 6"  (1.676 m), weight 61.9 kg, SpO2 97 %.  Physical Examination:   GENERAL:NAD, no fevers, chills, no weakness no fatigue HEAD: Normocephalic, atraumatic.  EYES: PERLA, EOMI No scleral icterus.  MOUTH: Moist mucosal membrane.  EAR, NOSE, THROAT: Clear without exudates. No external lesions.  NECK: Supple. No thyromegaly.  No JVD.  PULMONARY: CTA B/L no wheezing, rhonchi, crackles CARDIOVASCULAR: S1 and S2. Regular rate and rhythm. No murmurs GASTROINTESTINAL: Soft, nontender, nondistended. Positive bowel sounds.  MUSCULOSKELETAL: No swelling, clubbing, or edema.  NEUROLOGIC: No gross focal neurological deficits. 5/5 strength all extremities SKIN: No ulceration, lesions, rashes, or cyanosis.  PSYCHIATRIC: Insight, judgment intact. -depression -anxiety ALL OTHER ROS ARE NEGATIVE     Disposition: home D/C home with TRILOGY NON INVASIVE VENTIALTION   Allergies as of 01/27/2019   No Known Allergies     Medication List    TAKE these medications   albuterol (2.5 MG/3ML) 0.083% nebulizer solution Commonly known as:  PROVENTIL Inhale 3 mLs into the lungs every 4 (four) hours as needed for wheezing or shortness of breath.   albuterol 108 (90 Base) MCG/ACT inhaler Commonly known as: VENTOLIN HFA Inhale 2 puffs into the lungs every 4 (four) hours as needed for shortness of breath.   amoxicillin-clavulanate 875-125 MG tablet Commonly known as: Augmentin Take 1 tablet by mouth 2 (two) times daily for 14 days.   guaiFENesin 600 MG 12 hr tablet Commonly known as: MUCINEX Take 1 tablet (600 mg total) by mouth 2 (two) times daily.   mometasone-formoterol 200-5 MCG/ACT Aero Commonly known as: DULERA Inhale 2 puffs into the lungs 2 (two) times daily.   predniSONE 20 MG tablet Commonly known as: DELTASONE Take 2 tablets (40 mg total) by mouth daily with breakfast. 10 days   Spiriva Respimat 1.25 MCG/ACT Aers Generic drug: Tiotropium Bromide Monohydrate Inhale 1 puff into the lungs daily.        Signed: Flora Lipps 01/27/2019, 10:51 AM

## 2019-01-29 LAB — CULTURE, BLOOD (ROUTINE X 2)
Culture: NO GROWTH
Special Requests: ADEQUATE

## 2019-02-02 LAB — CULTURE, BLOOD (ROUTINE X 2): Special Requests: ADEQUATE

## 2019-04-16 ENCOUNTER — Emergency Department: Payer: Medicaid Other

## 2019-04-16 ENCOUNTER — Other Ambulatory Visit: Payer: Self-pay

## 2019-04-16 ENCOUNTER — Inpatient Hospital Stay
Admission: EM | Admit: 2019-04-16 | Discharge: 2019-04-20 | DRG: 190 | Disposition: A | Discharge status: Home / Self Care | Payer: Medicaid Other | Attending: Internal Medicine | Admitting: Internal Medicine

## 2019-04-16 ENCOUNTER — Encounter: Payer: Self-pay | Admitting: Emergency Medicine

## 2019-04-16 DIAGNOSIS — D696 Thrombocytopenia, unspecified: Secondary | ICD-10-CM | POA: Diagnosis present

## 2019-04-16 DIAGNOSIS — Z9114 Patient's other noncompliance with medication regimen: Secondary | ICD-10-CM

## 2019-04-16 DIAGNOSIS — J9602 Acute respiratory failure with hypercapnia: Secondary | ICD-10-CM

## 2019-04-16 DIAGNOSIS — J441 Chronic obstructive pulmonary disease with (acute) exacerbation: Secondary | ICD-10-CM | POA: Diagnosis present

## 2019-04-16 DIAGNOSIS — G9341 Metabolic encephalopathy: Secondary | ICD-10-CM | POA: Diagnosis present

## 2019-04-16 DIAGNOSIS — Z87891 Personal history of nicotine dependence: Secondary | ICD-10-CM | POA: Diagnosis not present

## 2019-04-16 DIAGNOSIS — J439 Emphysema, unspecified: Secondary | ICD-10-CM | POA: Diagnosis present

## 2019-04-16 DIAGNOSIS — W19XXXA Unspecified fall, initial encounter: Secondary | ICD-10-CM | POA: Diagnosis present

## 2019-04-16 DIAGNOSIS — J9621 Acute and chronic respiratory failure with hypoxia: Secondary | ICD-10-CM | POA: Diagnosis present

## 2019-04-16 DIAGNOSIS — Z20822 Contact with and (suspected) exposure to covid-19: Secondary | ICD-10-CM | POA: Diagnosis present

## 2019-04-16 DIAGNOSIS — Z79899 Other long term (current) drug therapy: Secondary | ICD-10-CM | POA: Diagnosis not present

## 2019-04-16 DIAGNOSIS — Z8611 Personal history of tuberculosis: Secondary | ICD-10-CM | POA: Diagnosis not present

## 2019-04-16 DIAGNOSIS — Z825 Family history of asthma and other chronic lower respiratory diseases: Secondary | ICD-10-CM

## 2019-04-16 DIAGNOSIS — J9622 Acute and chronic respiratory failure with hypercapnia: Secondary | ICD-10-CM | POA: Diagnosis present

## 2019-04-16 DIAGNOSIS — G9340 Encephalopathy, unspecified: Secondary | ICD-10-CM | POA: Diagnosis present

## 2019-04-16 DIAGNOSIS — S0081XA Abrasion of other part of head, initial encounter: Secondary | ICD-10-CM | POA: Diagnosis present

## 2019-04-16 DIAGNOSIS — J984 Other disorders of lung: Secondary | ICD-10-CM

## 2019-04-16 LAB — URINALYSIS, ROUTINE W REFLEX MICROSCOPIC
Bacteria, UA: NONE SEEN
Bilirubin Urine: NEGATIVE
Glucose, UA: NEGATIVE mg/dL
Hgb urine dipstick: NEGATIVE
Ketones, ur: 5 mg/dL — AB
Leukocytes,Ua: NEGATIVE
Nitrite: NEGATIVE
Protein, ur: 100 mg/dL — AB
Specific Gravity, Urine: 1.021 (ref 1.005–1.030)
pH: 5 (ref 5.0–8.0)

## 2019-04-16 LAB — COMPREHENSIVE METABOLIC PANEL
ALT: 13 U/L (ref 0–44)
AST: 26 U/L (ref 15–41)
Albumin: 4.3 g/dL (ref 3.5–5.0)
Alkaline Phosphatase: 48 U/L (ref 38–126)
Anion gap: UNDETERMINED (ref 5–15)
BUN: 23 mg/dL — ABNORMAL HIGH (ref 6–20)
CO2: >50 mmol/L — ABNORMAL HIGH (ref 22–32)
Calcium: 9.2 mg/dL (ref 8.9–10.3)
Chloride: 81 mmol/L — ABNORMAL LOW (ref 98–111)
Creatinine, Ser: 0.46 mg/dL — ABNORMAL LOW (ref 0.61–1.24)
GFR calc Af Amer: >60 mL/min (ref >60)
GFR calc non Af Amer: >60 mL/min (ref >60)
Glucose, Bld: 157 mg/dL — ABNORMAL HIGH (ref 70–99)
Potassium: 4.4 mmol/L (ref 3.5–5.1)
Sodium: 141 mmol/L (ref 135–145)
Total Bilirubin: 1.1 mg/dL (ref 0.3–1.2)
Total Protein: 7.7 g/dL (ref 6.5–8.1)

## 2019-04-16 LAB — URINE DRUG SCREEN, QUALITATIVE (ARMC ONLY)
Amphetamines, Ur Screen: NOT DETECTED
Barbiturates, Ur Screen: NOT DETECTED
Benzodiazepine, Ur Scrn: NOT DETECTED
Cannabinoid 50 Ng, Ur ~~LOC~~: NOT DETECTED
Cocaine Metabolite,Ur ~~LOC~~: NOT DETECTED
MDMA (Ecstasy)Ur Screen: NOT DETECTED
Methadone Scn, Ur: NOT DETECTED
Opiate, Ur Screen: NOT DETECTED
Phencyclidine (PCP) Ur S: NOT DETECTED
Tricyclic, Ur Screen: NOT DETECTED

## 2019-04-16 LAB — BLOOD GAS, ARTERIAL
Acid-Base Excess: 26.2 mmol/L — ABNORMAL HIGH (ref 0.0–2.0)
Bicarbonate: 59.3 mmol/L — ABNORMAL HIGH (ref 20.0–28.0)
Expiratory PAP: 10
FIO2: 0.3
Inspiratory PAP: 20
Mechanical Rate: 16
Mode: POSITIVE
O2 Saturation: 93.2 %
Patient temperature: 37
pCO2 arterial: 115 mmHg (ref 32.0–48.0)
pH, Arterial: 7.32 — ABNORMAL LOW (ref 7.350–7.450)
pO2, Arterial: 73 mmHg — ABNORMAL LOW (ref 83.0–108.0)

## 2019-04-16 LAB — PROTIME-INR
INR: 1 (ref 0.8–1.2)
Prothrombin Time: 13.2 seconds (ref 11.4–15.2)

## 2019-04-16 LAB — CBC WITH DIFFERENTIAL/PLATELET
Abs Immature Granulocytes: 0.06 10*3/uL (ref 0.00–0.07)
Basophils Absolute: 0 10*3/uL (ref 0.0–0.1)
Basophils Relative: 0 %
Eosinophils Absolute: 0 10*3/uL (ref 0.0–0.5)
Eosinophils Relative: 0 %
HCT: 41.7 % (ref 39.0–52.0)
Hemoglobin: 12.2 g/dL — ABNORMAL LOW (ref 13.0–17.0)
Immature Granulocytes: 1 %
Lymphocytes Relative: 12 %
Lymphs Abs: 0.9 10*3/uL (ref 0.7–4.0)
MCH: 31.3 pg (ref 26.0–34.0)
MCHC: 29.3 g/dL — ABNORMAL LOW (ref 30.0–36.0)
MCV: 106.9 fL — ABNORMAL HIGH (ref 80.0–100.0)
Monocytes Absolute: 0.7 10*3/uL (ref 0.1–1.0)
Monocytes Relative: 9 %
Neutro Abs: 5.7 10*3/uL (ref 1.7–7.7)
Neutrophils Relative %: 78 %
Platelets: 110 10*3/uL — ABNORMAL LOW (ref 150–400)
RBC: 3.9 MIL/uL — ABNORMAL LOW (ref 4.22–5.81)
RDW: 12.7 % (ref 11.5–15.5)
WBC: 7.4 10*3/uL (ref 4.0–10.5)
nRBC: 0 % (ref 0.0–0.2)

## 2019-04-16 LAB — LACTIC ACID, PLASMA
Lactic Acid, Venous: 0.7 mmol/L (ref 0.5–1.9)
Lactic Acid, Venous: 0.8 mmol/L (ref 0.5–1.9)

## 2019-04-16 LAB — BRAIN NATRIURETIC PEPTIDE: B Natriuretic Peptide: 86 pg/mL (ref 0.0–100.0)

## 2019-04-16 LAB — GLUCOSE, CAPILLARY: Glucose-Capillary: 130 mg/dL — ABNORMAL HIGH (ref 70–99)

## 2019-04-16 LAB — ACETAMINOPHEN LEVEL: Acetaminophen (Tylenol), Serum: <10 ug/mL — ABNORMAL LOW (ref 10–30)

## 2019-04-16 LAB — APTT: aPTT: 28 seconds (ref 24–36)

## 2019-04-16 LAB — RESPIRATORY PANEL BY RT PCR (FLU A&B, COVID)
Influenza A by PCR: NEGATIVE
Influenza B by PCR: NEGATIVE
SARS Coronavirus 2 by RT PCR: NEGATIVE

## 2019-04-16 LAB — AMMONIA: Ammonia: 46 umol/L — ABNORMAL HIGH (ref 9–35)

## 2019-04-16 LAB — SALICYLATE LEVEL: Salicylate Lvl: <7 mg/dL — ABNORMAL LOW (ref 7.0–30.0)

## 2019-04-16 LAB — ETHANOL: Alcohol, Ethyl (B): <10 mg/dL (ref <10)

## 2019-04-16 LAB — POC SARS CORONAVIRUS 2 AG: SARS Coronavirus 2 Ag: NEGATIVE

## 2019-04-16 LAB — PROCALCITONIN: Procalcitonin: <0.1 ng/mL

## 2019-04-16 MED ORDER — IPRATROPIUM-ALBUTEROL 0.5-2.5 (3) MG/3ML IN SOLN
3.0000 mL | Freq: Once | RESPIRATORY_TRACT | Status: AC
  Administered 2019-04-16: 3 mL via RESPIRATORY_TRACT

## 2019-04-16 MED ORDER — IPRATROPIUM-ALBUTEROL 0.5-2.5 (3) MG/3ML IN SOLN
RESPIRATORY_TRACT | Status: AC
  Filled 2019-04-16: qty 9

## 2019-04-16 MED ORDER — ALBUTEROL SULFATE (2.5 MG/3ML) 0.083% IN NEBU: 2.5000 mg | INHALATION_SOLUTION | Freq: Once | RESPIRATORY_TRACT | Status: DC

## 2019-04-16 MED ORDER — ALBUTEROL SULFATE (2.5 MG/3ML) 0.083% IN NEBU
2.5000 mg | INHALATION_SOLUTION | Freq: Once | RESPIRATORY_TRACT | Status: AC
  Administered 2019-04-16: 2.5 mg via RESPIRATORY_TRACT
  Filled 2019-04-16: qty 3

## 2019-04-16 MED ORDER — SODIUM CHLORIDE 0.9 % IV SOLN
1.0000 g | Freq: Once | INTRAVENOUS | Status: AC
  Administered 2019-04-16: 1 g via INTRAVENOUS
  Filled 2019-04-16: qty 10

## 2019-04-16 MED ORDER — MAGNESIUM SULFATE 2 GM/50ML IV SOLN
2.0000 g | Freq: Once | INTRAVENOUS | Status: AC
  Administered 2019-04-16: 2 g via INTRAVENOUS
  Filled 2019-04-16: qty 50

## 2019-04-16 MED ORDER — SODIUM CHLORIDE 0.9 % IV SOLN
500.0000 mg | Freq: Once | INTRAVENOUS | Status: AC
  Administered 2019-04-16: 19:00:00 500 mg via INTRAVENOUS
  Filled 2019-04-16: qty 500

## 2019-04-16 MED ORDER — METHYLPREDNISOLONE SODIUM SUCC 125 MG IJ SOLR
125.0000 mg | Freq: Once | INTRAMUSCULAR | Status: AC
  Administered 2019-04-16: 125 mg via INTRAVENOUS
  Filled 2019-04-16: qty 2

## 2019-04-16 MED ORDER — IPRATROPIUM-ALBUTEROL 0.5-2.5 (3) MG/3ML IN SOLN
3.0000 mL | Freq: Once | RESPIRATORY_TRACT | Status: AC
  Administered 2019-04-16: 19:00:00 3 mL via RESPIRATORY_TRACT

## 2019-04-16 NOTE — H&P (Signed)
History and Physical   Stokes Rattigan ZOX:[id number] DOB: 1970/04/10 DOA: 04/16/2019  Referring MD/NP/PA: Dr. Mort Sawyers  PCP: [facility]   Outpatient Specialists: None  Patient coming from: Home  Chief Complaint: Shortness of breath  HPI: Chris Case is a 49 y.o. male with medical history significant of COPD, medication noncompliance, who was brought in by EMS unresponsive from home. Patient apparently was confused and combative at home. He was supposed to have CPAP but patient was refusing that. EMS was able to bring him in on nonrebreather bag. His initial oxygen saturation at home was around 50% by EMS. On the nonrebreather back he was up to 100%. He was lethargic still at my exam. When he arrived the ER his completely obtunded. Currently on BiPAP. He has received IV steroids nebulizer treatment and still on BiPAP. His COVID-19 screen is negative. Patient is being admitted with acute on chronic respiratory failure secondary to COPD exacerbation. He had 1 hypoxemia and mild hypercarbia..  ED Course: His temperature 98.8 blood pressure 150/86 pulse 96 respiratory 27 oxygen sat 86% on 3 L 100% nonrebreather bag. Currently 91% on BiPAP. Urinalysis essentially negative COVID-19 is negative. Most recent ABG showed a pH of 7.32 PCO2 115 and PO2 73. Urine drug screen is negative. Head CT without contrast showed no acute finding. 3 showed glucose of 157 and CO2 of more than 50. CBC showed no significant findings. Chest x-ray showed severe emphysema but no acute findings. Patient is being admitted to stepdown unit on BiPAP with acute on chronic respiratory failure with both hypercarbia and hypoxia  Review of Systems: As per HPI otherwise 10 point review of systems negative.    Past Medical History:  Diagnosis Date  . COPD (chronic obstructive pulmonary disease) (HCC)   . Emphysema lung (HCC)   . 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 APPENDECTOMY  late 1980's  . LAPAROTOMY N/A 12/06/2016   Procedure: EXPLORATORY LAPAROTOMY- SBO;  Surgeon: [name], MD;  Location: [facility] ORS;  Service: General;  Laterality: N/A;     reports that he quit smoking about 3 years ago. His smoking use included cigarettes. He has a 60.00 pack-year smoking history. He has never used smokeless tobacco. He reports current drug use. Drug: Marijuana. He reports that he does not drink alcohol.  No Known Allergies  Family History  Problem Relation Age of Onset  . COPD Mother      Prior to Admission medications   Medication Sig Start Date End Date Taking? Authorizing Provider  albuterol (PROVENTIL) (2.5 MG/3ML) 0.083% nebulizer solution Inhale 3 mLs into the lungs every 4 (four) hours as needed for wheezing or shortness of breath. 09/26/15  Yes Katharina Caper, MD  albuterol (VENTOLIN HFA) 108 (90 Base) MCG/ACT inhaler Inhale 2 puffs into the lungs every 4 (four) hours as needed for shortness of breath. 01/09/19  Yes [provider]  mometasone-formoterol (DULERA) 200-5 MCG/ACT AERO Inhale 2 puffs into the lungs 2 (two) times daily. 09/26/15  Yes Katharina Caper, MD  Tiotropium Bromide Monohydrate (SPIRIVA RESPIMAT) 1.25 MCG/ACT AERS Inhale 1 puff into the lungs daily.    Yes [provider]    Physical Exam: Vitals:   04/16/19 2245 04/16/19 2300 04/16/19 2315 04/16/19 2323  BP: 109/82 105/74 102/78   Pulse: (!) 54 88 90   Resp: 16 16 15    Temp:  98.8 F (37.1 C)  TempSrc:    Axillary  SpO2: 92% 91% 91%   Weight:      Height:          Constitutional: Completely obtunded. Arousable but cannot stay awake Vitals:   04/16/19 2245 04/16/19 2300 04/16/19 2315 04/16/19 2323  BP: 109/82 105/74 102/78   Pulse: (!) 54 88 90   Resp: 16 16 15    Temp:    98.8 F (37.1 C)  TempSrc:    Axillary  SpO2: 92% 91% 91%   Weight:      Height:       Eyes: PERRL, lids and conjunctivae normal ENMT:  Mucous membranes are dry. Posterior pharynx clear of any exudate or lesions.Normal dentition.  Neck: normal, supple, no masses, no thyromegaly Respiratory: Poor air entry bilaterally with marked expiratory wheezing no rhonchi no rales, use of extra muscle respiration on BiPAP Cardiovascular: Regular rate and rhythm, no murmurs / rubs / gallops. No extremity edema. 2+ pedal pulses. No carotid bruits.  Abdomen: no tenderness, no masses palpated. No hepatosplenomegaly. Bowel sounds positive.  Musculoskeletal: no clubbing / cyanosis. No joint deformity upper and lower extremities. Good ROM, no contractures. Normal muscle tone.  Skin: no rashes, lesions, ulcers. No induration Neurologic: CN 2-12 grossly intact. Sensation intact, DTR normal. Strength 5/5 in all 4.  Psychiatric: Confused, basically obtunded on BiPAP    Labs on Admission: I have personally reviewed following labs and imaging studies  CBC: Recent Labs  Lab 04/16/19 1842  WBC 7.4  NEUTROABS 5.7  HGB 12.2*  HCT 41.7  MCV 106.9*  PLT 110*   Basic Metabolic Panel: Recent Labs  Lab 04/16/19 1842  NA 141  K 4.4  CL 81*  CO2 >50*  GLUCOSE 157*  BUN 23*  CREATININE 0.46*  CALCIUM 9.2   GFR: Estimated Creatinine Clearance: 97.8 mL/min (A) (by C-G formula based on SCr of 0.46 mg/dL (L)). Liver Function Tests: Recent Labs  Lab 04/16/19 1842  AST 26  ALT 13  ALKPHOS 48  BILITOT 1.1  PROT 7.7  ALBUMIN 4.3   No results for input(s): LIPASE, AMYLASE in the last 168 hours. Recent Labs  Lab 04/16/19 1842  AMMONIA 46*   Coagulation Profile: Recent Labs  Lab 04/16/19 1842  INR 1.0   Cardiac Enzymes: No results for input(s): CKTOTAL, CKMB, CKMBINDEX, TROPONINI in the last 168 hours. BNP (last 3 results) No results for input(s): PROBNP in the last 8760 hours. HbA1C: No results for input(s): HGBA1C in the last 72 hours. CBG: Recent Labs  Lab 04/16/19 1845  GLUCAP 130*   Lipid Profile: No results for  input(s): CHOL, HDL, LDLCALC, TRIG, CHOLHDL, LDLDIRECT in the last 72 hours. Thyroid Function Tests: No results for input(s): TSH, T4TOTAL, FREET4, T3FREE, THYROIDAB in the last 72 hours. Anemia Panel: No results for input(s): VITAMINB12, FOLATE, FERRITIN, TIBC, IRON, RETICCTPCT in the last 72 hours. Urine analysis:    Component Value Date/Time   COLORURINE YELLOW (A) 04/16/2019 2051   APPEARANCEUR HAZY (A) 04/16/2019 2051   LABSPEC 1.021 04/16/2019 2051   PHURINE 5.0 04/16/2019 2051   GLUCOSEU NEGATIVE 04/16/2019 2051   HGBUR NEGATIVE 04/16/2019 2051   BILIRUBINUR NEGATIVE 04/16/2019 2051   KETONESUR 5 (A) 04/16/2019 2051   PROTEINUR 100 (A) 04/16/2019 2051   NITRITE NEGATIVE 04/16/2019 2051   LEUKOCYTESUR NEGATIVE 04/16/2019 2051   Sepsis Labs: @LABRCNTIP (procalcitonin:4,lacticidven:4) ) Recent Results (from the past 240 hour(s))  Respiratory Panel by RT PCR (Flu A&B, Covid) -  Nasopharyngeal Swab     Status: None   Collection Time: 04/16/19  7:37 PM   Specimen: Nasopharyngeal Swab  Result Value Ref Range Status   SARS Coronavirus 2 by RT PCR NEGATIVE NEGATIVE Final    Comment: (NOTE) SARS-CoV-2 target nucleic acids are NOT DETECTED. The SARS-CoV-2 RNA is generally detectable in upper respiratoy specimens during the acute phase of infection. The lowest concentration of SARS-CoV-2 viral copies this assay can detect is 131 copies/mL. A negative result does not preclude SARS-Cov-2 infection and should not be used as the sole basis for treatment or other patient management decisions. A negative result may occur with  improper specimen collection/handling, submission of specimen other than nasopharyngeal swab, presence of viral mutation(s) within the areas targeted by this assay, and inadequate number of viral copies (<131 copies/mL). A negative result must be combined with clinical observations, patient history, and epidemiological information. The expected result is  Negative. Fact Sheet for Patients:  PinkCheek.be Fact Sheet for Healthcare Providers:  GravelBags.it This test is not yet ap proved or cleared by the [place] FDA and  has been authorized for detection and/or diagnosis of SARS-CoV-2 by FDA under an Emergency Use Authorization (EUA). This EUA will remain  in effect (meaning this test can be used) for the duration of the COVID-19 declaration under Section 564(b)(1) of the Act, 21 U.S.C. section 360bbb-3(b)(1), unless the authorization is terminated or revoked sooner.    Influenza A by PCR NEGATIVE NEGATIVE Final   Influenza B by PCR NEGATIVE NEGATIVE Final    Comment: (NOTE) The Xpert Xpress SARS-CoV-2/FLU/RSV assay is intended as an aid in  the diagnosis of influenza from Nasopharyngeal swab specimens and  should not be used as a sole basis for treatment. Nasal washings and  aspirates are unacceptable for Xpert Xpress SARS-CoV-2/FLU/RSV  testing. Fact Sheet for Patients: PinkCheek.be Fact Sheet for Healthcare Providers: GravelBags.it This test is not yet approved or cleared by the [place] FDA and  has been authorized for detection and/or diagnosis of SARS-CoV-2 by  FDA under an Emergency Use Authorization (EUA). This EUA will remain  in effect (meaning this test can be used) for the duration of the  Covid-19 declaration under Section 564(b)(1) of the Act, 21  U.S.C. section 360bbb-3(b)(1), unless the authorization is  terminated or revoked. Performed at [facility], [address] [place], [place] [address]      Radiological Exams on Admission: CT Head Wo Contrast  Result Date: 04/16/2019 CLINICAL DATA:  [place] yesterday, altered level of consciousness, combative EXAM: CT HEAD WITHOUT CONTRAST TECHNIQUE: Contiguous axial images were obtained from the base of the skull through the vertex without  intravenous contrast. COMPARISON:  01/25/2019 FINDINGS: Brain: No acute infarct or hemorrhage. Lateral ventricles and midline structures are stable. No acute extra-axial fluid collections. No mass effect. Low lying cerebellar tonsils again noted. Vascular: No hyperdense vessel or unexpected calcification. Skull: Normal. Negative for fracture or focal lesion. Sinuses/Orbits: No acute finding. Other: None IMPRESSION: 1. Stable head CT, no acute intracranial process. 2. Stable Chiari malformation. Electronically Signed   By: Randa Ngo M.D.   On: 04/16/2019 19:59   DG Chest Portable 1 View  Result Date: 04/16/2019 CLINICAL DATA:  Respiratory distress EXAM: PORTABLE CHEST 1 VIEW COMPARISON:  01/24/2019 FINDINGS: 2 frontal views of the chest demonstrate a stable cardiac silhouette. Extensive emphysema is again noted. Prominent vascular shadows at the right lung base, stable since 2015. No airspace disease, effusion, or pneumothorax. No acute bony  abnormalities. IMPRESSION: 1. Severe emphysema, no superimposed airspace disease. Electronically Signed   By: Sharlet Salina M.D.   On: 04/16/2019 18:52    EKG: Independently reviewed. Shows normal sinus rhythm with a rate of 80. No significant ST findings.  Assessment/Plan Principal Problem:   Acute on chronic respiratory failure with hypoxia and hypercapnia (HCC) Active Problems:   COPD exacerbation (HCC)   Thrombocytopenia (HCC)     #1 acute on chronic respiratory failure with hypoxia and hypercarbia: Patient currently on BiPAP and obtunded. Continue with BiPAP and monitor ABG and response. He will be titrated home O2. Initiate IV Solu-Medrol. Breathing treatment as well as antibiotics. Patient so far has poor air movement but no evidence of pneumonia. COVID-19 also negative. We will follow the COPD gold protocol.  #2 thrombocytopenia: Chronic. Continue management and monitoring. Be careful with anticoagulation with heparin products.  #3 metabolic  encephalopathy: Patient has profound confusion. He is obtunded. Most likely due to hypercarbia. Continue BiPAP. If no improvement and patient appears to require intubation we will intubate.   DVT prophylaxis: Lovenox Code Status: Full code Family Communication: No family at bedside Disposition Plan: To be determined Consults called: None at this point Admission status: Inpatient to stepdown unit  Severity of Illness: The appropriate patient status for this patient is INPATIENT. Inpatient status is judged to be reasonable and necessary in order to provide the required intensity of service to ensure the patient's safety. The patient's presenting symptoms, physical exam findings, and initial radiographic and laboratory data in the context of their chronic comorbidities is felt to place them at high risk for further clinical deterioration. Furthermore, it is not anticipated that the patient will be medically stable for discharge from the hospital within 2 midnights of admission. The following factors support the patient status of inpatient.   " The patient's presenting symptoms include altered mental status and shortness of breath. " The worrisome physical exam findings include poor air entry bilaterally with marked expiratory wheezing. " The initial radiographic and laboratory data are worrisome because of no significant findings on x-ray. " The chronic co-morbidities include COPD history.   * I certify that at the point of admission it is my clinical judgment that the patient will require inpatient hospital care spanning beyond 2 midnights from the point of admission due to high intensity of service, high risk for further deterioration and high frequency of surveillance required.Lonia Blood MD [facility] Pager [phone]  If 7PM-7AM, please contact night-coverage [email or web address] Password [facility]  04/16/2019, 11:50 PM

## 2019-04-16 NOTE — ED Notes (Signed)
Admitting MD at bedside.

## 2019-04-16 NOTE — ED Notes (Signed)
Pt placed on BiPap, Dr. Colon Branch remains at bedside, plans to watch pt for 20-30 minutes, if no improvement, will intubate pt.

## 2019-04-16 NOTE — ED Notes (Signed)
Dr. Elmore Guise at bedside pt responsive to verbal commands

## 2019-04-16 NOTE — ED Triage Notes (Signed)
Pt to ER via EMS from home with c/o respiratory distress.  On EMS arrival pt was combative and refusing treatment attempting to use home CPAP for relief.  EMS worked with pt and convinced him to be transported.  Per EMS initial sats below 50%.  Pt arrives on NRB sats 100% at present.  Pt remains lethargic.  Dr. Colon Branch at bedside, RT to place pt on BiPap.

## 2019-04-16 NOTE — ED Notes (Addendum)
Pt back to room from CT att; pt calm and easy resp on bipap  Azithromycin placed on pump - was not infusing via gravity    Pt very sleepy wakes and nods compliance to sternal rub

## 2019-04-16 NOTE — Progress Notes (Signed)
Worked with patient earlier. Patient very lethargic. Changes were made on bipap with Dr Colon Branch at bedside. Patient responded well. O2 decreased. Expiratory time increased. Ipap increased. Notable increase in tidal volumes as well as minute ventilation. Patient woke up and was able to follow Dr Colon Branch and RNs commmands. Dr Colon Branch requested o2 sats 88-92%. Decreased o2 to 25% Patient very awake and alert however, saturation began to fall to mid to low 80s.  O2 increased as high as 40% while in CT to satisfy MD sat range and patient became lethargic quickly. Patient very sensitive to o2 and responds very well to lower sats. Discussed results with Dr Colon Branch. Abg noted with improved PH.  Will continue to titrate o2 for sats 88-92%

## 2019-04-16 NOTE — ED Provider Notes (Signed)
[facility] Emergency Department Provider Note  ____________________________________________   First MD Initiated Contact with Patient 04/16/19 1835     (approximate)  I have reviewed the triage vital signs and the nursing notes.  History  Chief Complaint Respiratory Distress    HPI Chris Case is a 49 y.o. male with history of severe COPD, emphysema brought in by EMS, called out for altered mental status and respiratory distress.  On EMS arrival patient is altered, initial oxygen saturation less than 50% on RA, improved to upper 90s on NRB.  Initially hypotensive, 60s over 30s.  Arrives on nonrebreather, status post IV fluids with EMS with improvement in blood pressure to 150/86.  EMS does note that patient's roommate states he fell yesterday and hit his head.  Patient unable to provide any history on arrival due to altered mental status.   Past Medical Hx Past Medical History:  Diagnosis Date  . COPD (chronic obstructive pulmonary disease) (HCC)   . Emphysema lung (HCC)   . 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 Acute respiratory failure with hypoxia (HCC) 01/24/2019  . Thrombocytopenia (HCC) 01/24/2019  . Protein-calorie malnutrition, severe 12/10/2016  . Intestinal adhesions with complete obstruction (HCC) 12/05/2016  . Constipation   . SBO (small bowel obstruction) (HCC)   . Acute bronchitis 09/26/2015  . COPD exacerbation (HCC) 09/24/2015  . Acute on chronic respiratory failure with hypoxia and hypercapnia (HCC) 09/24/2015  . Abnormal ECG 09/24/2015  . Edema, lower extremity 09/24/2015    Past Surgical Hx Past Surgical History:  Procedure Laterality Date  . APPENDECTOMY  late 1980's  . LAPAROTOMY N/A 12/06/2016   Procedure: EXPLORATORY LAPAROTOMY- SBO;  Surgeon: [name], MD;  Location: [facility] ORS;  Service: General;  Laterality: N/A;     Medications Prior to Admission medications   Medication Sig Start Date End Date Taking? Authorizing Provider  albuterol (PROVENTIL) (2.5 MG/3ML) 0.083% nebulizer solution Inhale 3 mLs into the lungs every 4 (four) hours as needed for wheezing or shortness of breath. 09/26/15  Yes Katharina Caper, MD  albuterol (VENTOLIN HFA) 108 (90 Base) MCG/ACT inhaler Inhale 2 puffs into the lungs every 4 (four) hours as needed for shortness of breath. 01/09/19  Yes [provider]  mometasone-formoterol (DULERA) 200-5 MCG/ACT AERO Inhale 2 puffs into the lungs 2 (two) times daily. 09/26/15  Yes Katharina Caper, MD  Tiotropium Bromide Monohydrate (SPIRIVA RESPIMAT) 1.25 MCG/ACT AERS Inhale 1 puff into the lungs daily.    Yes [provider]    Allergies Patient has no known allergies.  Family Hx Family History  Problem Relation Age of Onset  . COPD Mother     Social Hx Social History   Tobacco Use  . Smoking status: Former Smoker    Packs/day: 3.00    Years: 20.00    Pack years: 60.00    Types: Cigarettes    Quit date: 04/11/2016    Years since quitting: 3.0  . Smokeless tobacco: Never Used  Substance Use Topics  . Alcohol use: No    Comment: former drinker-1 pint of gin/week-quit 2015  . Drug use: Yes    Types: Marijuana    Comment: one joint /day x 2 y     Review of Systems Unable to obtain due to AMS   Physical Exam  Vital Signs: ED Triage Vitals  Enc Vitals Group  BP 04/16/19 1828 (!) 150/86     Pulse Rate 04/16/19 1828 96     Resp 04/16/19 1828 (!) 27     Temp 04/16/19 1919 (!) 96.9 F (36.1 C)     Temp Source 04/16/19 1919 Axillary     SpO2 04/16/19 1828 100 %     Weight 04/16/19 1830 136 lb 7.4 oz (61.9 kg)     Height 04/16/19 1830 6' (1.829 m)     Head Circumference --      Peak Flow --      Pain Score --      Pain Loc --      Pain Edu? --      Excl. in GC? --     Constitutional: Awakens to sternal rub. Head: Normocephalic.  Abrasion to  mid head. Eyes: Conjunctivae clear. Sclera anicteric. Nose: No congestion. No rhinorrhea. Mouth/Throat: MM extremely dry. Neck: No stridor.   Cardiovascular: Normal rate, regular rhythm. Extremities well perfused. Respiratory: Significantly decreased air movement throughout, very rare faint scattered wheezing. Gastrointestinal: Soft. Non-tender. Non-distended.  Musculoskeletal: No lower extremity edema. No deformities. Neurologic:  Altered. Responds to pain. Moves all extremities.  Skin: Abrasion to forehead. Psychiatric: Mood and affect are appropriate for situation.  EKG  Personally reviewed.   Rate: 87 Rhythm: sinus Axis: normal Intervals: WNL No acute ischemic changes No STEMI    Radiology  CXR: IMPRESSION:  1. Severe emphysema, no superimposed airspace disease.   CT head: IMPRESSION:  1. Stable head CT, no acute intracranial process.  2. Stable Chiari malformation.    Procedures  Procedure(s) performed (including critical care):  .Critical Care Performed by: Miguel Aschoff., MD Authorized by: Miguel Aschoff., MD   Critical care provider statement:    Critical care time (minutes):  60   Critical care was necessary to treat or prevent imminent or life-threatening deterioration of the following conditions:  Respiratory failure   Critical care was time spent personally by me on the following activities:  Discussions with consultants, evaluation of patient's response to treatment, examination of patient, ordering and performing treatments and interventions, ordering and review of laboratory studies, ordering and review of radiographic studies, pulse oximetry, re-evaluation of patient's condition, obtaining history from patient or surrogate and review of old charts     Initial Impression / Assessment and Plan / ED Course  49 y.o. male who presents to the ED for altered mental status, respiratory distress. Hx severe COPD and emphysema.   Suspect likely  significant hypercarbic respiratory failure resulting in altered mental status based on his history.  We will initiate BiPAP, Duonebs, steroids, magnesium, antibiotics however if patient does not demonstrate improvement in his AMS, will have to consider intubation.  Obtain broad-spectrum labs, including cultures, lactic, ammonia.  CT head given fall yesterday.  Initial VBG demonstrates significant hypercarbia, pCO2 greater than 120, pH 7.16.  Bicarb on CMP greater than 50, consistent with acute on chronic hypercarbic respiratory failure.  Patient demonstrating improvement in his mental status, now awakens to voice easily, following commands. Pulling more appropriate tidal volumes, upper 300s/400s. Does not require intubation at this time, but will continue to monitor closely. We will continue with CAT through BiPAP.  Repeat ABG demonstrates some improvement in pH to 7.32.  Ideal oxygen saturation around 88% in the setting of his chronic COPD, will allow for some hypoxia to avoid losing his respiratory drive.  Discussed w/ hospitalist for admission.    Final Clinical Impression(s) / ED Diagnosis  Final  diagnoses:  Acute respiratory failure with hypercapnia ([place])  Encephalopathy       Note:  This document was prepared using Dragon voice recognition software and may include unintentional dictation errors.   Lilia Pro., MD 04/17/19 0040

## 2019-04-16 NOTE — ED Notes (Signed)
Pt to CT accompanied by RT, and Danelle Earthly, Charity fundraiser

## 2019-04-17 ENCOUNTER — Other Ambulatory Visit: Payer: Self-pay

## 2019-04-17 ENCOUNTER — Encounter: Payer: Self-pay | Admitting: Internal Medicine

## 2019-04-17 DIAGNOSIS — J9622 Acute and chronic respiratory failure with hypercapnia: Secondary | ICD-10-CM

## 2019-04-17 DIAGNOSIS — J9621 Acute and chronic respiratory failure with hypoxia: Secondary | ICD-10-CM

## 2019-04-17 LAB — MRSA PCR SCREENING: MRSA by PCR: NEGATIVE

## 2019-04-17 LAB — COMPREHENSIVE METABOLIC PANEL
ALT: 14 U/L (ref 0–44)
AST: 25 U/L (ref 15–41)
Albumin: 3.9 g/dL (ref 3.5–5.0)
Alkaline Phosphatase: 44 U/L (ref 38–126)
Anion gap: 9 (ref 5–15)
BUN: 22 mg/dL — ABNORMAL HIGH (ref 6–20)
CO2: 48 mmol/L — ABNORMAL HIGH (ref 22–32)
Calcium: 8.9 mg/dL (ref 8.9–10.3)
Chloride: 83 mmol/L — ABNORMAL LOW (ref 98–111)
Creatinine, Ser: 0.61 mg/dL (ref 0.61–1.24)
GFR calc Af Amer: >60 mL/min (ref >60)
GFR calc non Af Amer: >60 mL/min (ref >60)
Glucose, Bld: 144 mg/dL — ABNORMAL HIGH (ref 70–99)
Potassium: 4.6 mmol/L (ref 3.5–5.1)
Sodium: 140 mmol/L (ref 135–145)
Total Bilirubin: 1.1 mg/dL (ref 0.3–1.2)
Total Protein: 7.3 g/dL (ref 6.5–8.1)

## 2019-04-17 LAB — GLUCOSE, CAPILLARY: Glucose-Capillary: 145 mg/dL — ABNORMAL HIGH (ref 70–99)

## 2019-04-17 LAB — CBC
HCT: 38.2 % — ABNORMAL LOW (ref 39.0–52.0)
HCT: 35.8 % — ABNORMAL LOW (ref 39.0–52.0)
Hemoglobin: 11.1 g/dL — ABNORMAL LOW (ref 13.0–17.0)
Hemoglobin: 10.8 g/dL — ABNORMAL LOW (ref 13.0–17.0)
MCH: 30.8 pg (ref 26.0–34.0)
MCH: 31.4 pg (ref 26.0–34.0)
MCHC: 29.1 g/dL — ABNORMAL LOW (ref 30.0–36.0)
MCHC: 30.2 g/dL (ref 30.0–36.0)
MCV: 106.1 fL — ABNORMAL HIGH (ref 80.0–100.0)
MCV: 104.1 fL — ABNORMAL HIGH (ref 80.0–100.0)
Platelets: 106 10*3/uL — ABNORMAL LOW (ref 150–400)
Platelets: 104 10*3/uL — ABNORMAL LOW (ref 150–400)
RBC: 3.6 MIL/uL — ABNORMAL LOW (ref 4.22–5.81)
RBC: 3.44 MIL/uL — ABNORMAL LOW (ref 4.22–5.81)
RDW: 12.6 % (ref 11.5–15.5)
RDW: 12.7 % (ref 11.5–15.5)
WBC: 6.3 10*3/uL (ref 4.0–10.5)
WBC: 5.2 10*3/uL (ref 4.0–10.5)
nRBC: 0 % (ref 0.0–0.2)
nRBC: 0.4 % — ABNORMAL HIGH (ref 0.0–0.2)

## 2019-04-17 LAB — BLOOD GAS, ARTERIAL
Acid-Base Excess: 32 mmol/L — ABNORMAL HIGH (ref 0.0–2.0)
Bicarbonate: 64 mmol/L — ABNORMAL HIGH (ref 20.0–28.0)
Delivery systems: POSITIVE
Expiratory PAP: 10
FIO2: 0.3
Inspiratory PAP: 20
O2 Saturation: 86.7 %
Patient temperature: 37
RATE: 16 resp/min
pCO2 arterial: 101 mmHg (ref 32.0–48.0)
pH, Arterial: 7.41 (ref 7.350–7.450)
pO2, Arterial: 52 mmHg — ABNORMAL LOW (ref 83.0–108.0)

## 2019-04-17 LAB — HIV ANTIBODY (ROUTINE TESTING W REFLEX): HIV Screen 4th Generation wRfx: NONREACTIVE

## 2019-04-17 LAB — CREATININE, SERUM
Creatinine, Ser: 0.77 mg/dL (ref 0.61–1.24)
GFR calc Af Amer: >60 mL/min (ref >60)
GFR calc non Af Amer: >60 mL/min (ref >60)

## 2019-04-17 MED ORDER — LEVOFLOXACIN IN D5W 750 MG/150ML IV SOLN
750.0000 mg | INTRAVENOUS | Status: DC
  Administered 2019-04-17 – 2019-04-19 (×3): 750 mg via INTRAVENOUS
  Filled 2019-04-17 (×4): qty 150

## 2019-04-17 MED ORDER — ONDANSETRON HCL 4 MG/2ML IJ SOLN: 4.0000 mg | Freq: Four times a day (QID) | INTRAMUSCULAR | Status: DC | PRN

## 2019-04-17 MED ORDER — METHYLPREDNISOLONE 4 MG PO TABS
4.0000 mg | ORAL_TABLET | Freq: Every day | ORAL | Status: DC
  Filled 2019-04-17: qty 1

## 2019-04-17 MED ORDER — IPRATROPIUM-ALBUTEROL 0.5-2.5 (3) MG/3ML IN SOLN
3.0000 mL | RESPIRATORY_TRACT | Status: DC
  Administered 2019-04-17 – 2019-04-19 (×13): 3 mL via RESPIRATORY_TRACT
  Filled 2019-04-17 (×12): qty 3

## 2019-04-17 MED ORDER — METHYLPREDNISOLONE SODIUM SUCC 125 MG IJ SOLR
48.0000 mg | Freq: Once | INTRAMUSCULAR | Status: AC
  Administered 2019-04-17: 48 mg via INTRAVENOUS
  Filled 2019-04-17: qty 2

## 2019-04-17 MED ORDER — METHYLPREDNISOLONE 16 MG PO TABS
16.0000 mg | ORAL_TABLET | Freq: Every day | ORAL | Status: DC
  Filled 2019-04-17: qty 1

## 2019-04-17 MED ORDER — METHYLPREDNISOLONE 4 MG PO TABS
8.0000 mg | ORAL_TABLET | Freq: Every day | ORAL | Status: DC
  Filled 2019-04-17: qty 2

## 2019-04-17 MED ORDER — ORAL CARE MOUTH RINSE
15.0000 mL | Freq: Two times a day (BID) | OROMUCOSAL | Status: DC
  Administered 2019-04-17 – 2019-04-19 (×4): 15 mL via OROMUCOSAL

## 2019-04-17 MED ORDER — LEVOFLOXACIN IN D5W 500 MG/100ML IV SOLN
500.0000 mg | INTRAVENOUS | Status: DC
  Administered 2019-04-17: 500 mg via INTRAVENOUS
  Filled 2019-04-17: qty 100

## 2019-04-17 MED ORDER — DEXMEDETOMIDINE HCL IN NACL 400 MCG/100ML IV SOLN
0.4000 ug/kg/h | INTRAVENOUS | Status: DC
  Administered 2019-04-17: 0.4 ug/kg/h via INTRAVENOUS
  Administered 2019-04-18: 0.5 ug/kg/h via INTRAVENOUS
  Filled 2019-04-17 (×2): qty 100

## 2019-04-17 MED ORDER — ENOXAPARIN SODIUM 40 MG/0.4ML ~~LOC~~ SOLN
40.0000 mg | SUBCUTANEOUS | Status: DC
  Administered 2019-04-17 – 2019-04-20 (×4): 40 mg via SUBCUTANEOUS
  Filled 2019-04-17 (×4): qty 0.4

## 2019-04-17 MED ORDER — CHLORHEXIDINE GLUCONATE 0.12 % MT SOLN
15.0000 mL | Freq: Two times a day (BID) | OROMUCOSAL | Status: DC
  Administered 2019-04-17 – 2019-04-19 (×5): 15 mL via OROMUCOSAL
  Filled 2019-04-17 (×5): qty 15

## 2019-04-17 MED ORDER — BUDESONIDE 0.25 MG/2ML IN SUSP
0.2500 mg | Freq: Two times a day (BID) | RESPIRATORY_TRACT | Status: DC
  Administered 2019-04-17 – 2019-04-20 (×7): 0.25 mg via RESPIRATORY_TRACT
  Filled 2019-04-17 (×7): qty 2

## 2019-04-17 MED ORDER — PREDNISONE 10 MG PO TABS: 40.0000 mg | ORAL_TABLET | Freq: Every day | ORAL | Status: DC

## 2019-04-17 MED ORDER — CHLORHEXIDINE GLUCONATE CLOTH 2 % EX PADS
6.0000 | MEDICATED_PAD | Freq: Every day | CUTANEOUS | Status: DC
  Administered 2019-04-17 – 2019-04-19 (×3): 6 via TOPICAL

## 2019-04-17 MED ORDER — ACETAMINOPHEN 650 MG RE SUPP: 650.0000 mg | Freq: Four times a day (QID) | RECTAL | Status: DC | PRN

## 2019-04-17 MED ORDER — ACETAMINOPHEN 325 MG PO TABS: 650.0000 mg | ORAL_TABLET | Freq: Four times a day (QID) | ORAL | Status: DC | PRN

## 2019-04-17 MED ORDER — SODIUM CHLORIDE 0.9 % IV SOLN: INTRAVENOUS | Status: DC

## 2019-04-17 MED ORDER — ONDANSETRON HCL 4 MG PO TABS: 4.0000 mg | ORAL_TABLET | Freq: Four times a day (QID) | ORAL | Status: DC | PRN

## 2019-04-17 MED ORDER — ARFORMOTEROL TARTRATE 15 MCG/2ML IN NEBU
15.0000 ug | INHALATION_SOLUTION | Freq: Two times a day (BID) | RESPIRATORY_TRACT | Status: DC
  Administered 2019-04-17 – 2019-04-20 (×7): 15 ug via RESPIRATORY_TRACT
  Filled 2019-04-17 (×9): qty 2

## 2019-04-17 MED ORDER — METHYLPREDNISOLONE SODIUM SUCC 40 MG IJ SOLR
40.0000 mg | Freq: Four times a day (QID) | INTRAMUSCULAR | Status: AC
  Administered 2019-04-17 – 2019-04-18 (×4): 40 mg via INTRAVENOUS
  Filled 2019-04-17 (×4): qty 1

## 2019-04-17 NOTE — ED Notes (Signed)
Report received from [place], [place]. Pt is currently sleep at this time.

## 2019-04-17 NOTE — ED Notes (Signed)
Pt's roommate Ms. Fits called, pt gave permission to talked to her RN called back at [phone]

## 2019-04-17 NOTE — Progress Notes (Signed)
OT Cancellation Note  Patient Details Name: Chris Case MRN: [id number] DOB: 1970-03-06   Cancelled Treatment:    Reason Eval/Treat Not Completed: Other (comment);Patient at procedure or test/ unavailable. Thank you for the OT consult. Order received and chart reviewed. Upon attempt to see pt for OT evaluation, pt noted to be off the floor. Will continue to follow remotely and initiate services as available and pt medically appropriate for occupational therapy.   Rockney Ghee, M.S., OTR/L Ascom: [phone] 04/17/19, 12:39 PM

## 2019-04-17 NOTE — ED Notes (Signed)
Patient was resting, awakens spontaneously and answers questions appropriately. Asked this RN what happened and why he ended up in the ER, I explained that he became hypoxic and unable to breathe on his own so we placed him on BiPAP  Patient continues to tolerate the BiPAP well although he occasionally asks for water. Allowed patient to moisten mouth, but this RN noticed that while off the BiPAP even for a few seconds, patient resumes labored breathing with retractions. Patient seems unable to wean off BiPAP at this time due to work of breathing. Encouraged patient to get some rest. IV in [facility] was bothering him so this RN removed it as it was his third IV

## 2019-04-17 NOTE — ED Notes (Signed)
This RN entered room, pt states he is feeling better. Pt NAD and tolerating BiPap well. Pt is A&Ox4 at this time.

## 2019-04-17 NOTE — Progress Notes (Signed)
PROGRESS NOTE    Emre Stock  TWS:[id number] DOB: [birth year] DOA: 04/16/2019 PCP: [facility]   Brief Narrative:  HPI: Chris Case is a 49 y.o. male with medical history significant of COPD, medication noncompliance, who was brought in by EMS unresponsive from home. Patient apparently was confused and combative at home. He was supposed to have CPAP but patient was refusing that. EMS was able to bring him in on nonrebreather bag. His initial oxygen saturation at home was around 50% by EMS. On the nonrebreather back he was up to 100%. He was lethargic still at my exam. When he arrived the ER his completely obtunded. Currently on BiPAP. He has received IV steroids nebulizer treatment and still on BiPAP. His COVID-19 screen is negative. Patient is being admitted with acute on chronic respiratory failure secondary to COPD exacerbation.  2/13: Patient seen and examined.  Remains on BiPAP in the emergency department.  Mentating much better now.  Alert and oriented x4.  Instructed the ED nurse to have respiratory attempts to titrate off noninvasive positive pressure ventilation.   Assessment & Plan:   Principal Problem:   Acute on chronic respiratory failure with hypoxia and hypercapnia (HCC) Active Problems:   COPD exacerbation (HCC)   Thrombocytopenia (HCC)  Acute exacerbation of COPD Acute on chronic hypercarbic and hypoxic respiratory failure Acute metabolic encephalopathy Patient has been on BiPAP overnight Appears to be responding, mental status improving Currently alert and oriented x4 Plan: Attempt to wean from BiPAP If successful liberation from BiPAP to nasal cannula, we can advance diet and transfer to MedSurg Currently n.p.o. while requiring BiPAP IV Solu-Medrol 40 mg every 6h x1 day, followed by 40 mg prednisone p.o QD x5 days IV Levaquin 750 mg daily DuoNeb every 4 hours Brovana twice daily Pulmicort twice daily Incentive spirometry/flutter valve  use  Thrombocytopenia Appears chronic No indication of bleeding Monitor while on chemoprophylaxis   DVT prophylaxis: Lovenox Code Status: Full Family Communication: None today Disposition Plan: Anticipate home, 48 to 72 hours  Consultants:   None  Procedures:   NIPPV  Antimicrobials:   Levaquin (04/16/19-  )   Subjective: Seen and examined in the emergency department Remains on BiPAP Saturations improved Mentating well, oriented times 4  Objective: Vitals:   04/17/19 0700 04/17/19 0800 04/17/19 0900 04/17/19 1000  BP: 112/90 113/81 (!) 131/94 (!) 127/95  Pulse: 95 91 98 100  Resp: (!) 21 16 20 17   Temp:      TempSrc:      SpO2: 99% 99% 99% 98%  Weight:      Height:        Intake/Output Summary (Last 24 hours) at 04/17/2019 1110 Last data filed at 04/17/2019 0827 Gross per 24 hour  Intake 1116.91 ml  Output --  Net 1116.91 ml   Filed Weights   04/16/19 1830  Weight: 61.9 kg    Examination:  General exam: Appears calm and comfortable  Respiratory system: Diffusely decreased lung sounds bilaterally.  No obvious wheeze.  Normal work of breathing Cardiovascular system: S1, S2 heard, RRR, no murmurs, no pedal edema  gastrointestinal system: Abdomen is nondistended, soft and nontender. No organomegaly or masses felt. Normal bowel sounds heard. Central nervous system: Alert and oriented. No focal neurological deficits. Extremities: Symmetric 5 x 5 power. Skin: No rashes, lesions or ulcers Psychiatry: Judgement and insight appear normal. Mood & affect appropriate.     Data Reviewed: I have personally reviewed following labs and imaging studies  CBC: Recent  Labs  Lab 04/16/19 1842 04/17/19 0127 04/17/19 0458  WBC 7.4 6.3 5.2  NEUTROABS 5.7  --   --   HGB 12.2* 11.1* 10.8*  HCT 41.7 38.2* 35.8*  MCV 106.9* 106.1* 104.1*  PLT 110* 106* 151*   Basic Metabolic Panel: Recent Labs  Lab 04/16/19 1842 04/17/19 0127 04/17/19 0458  NA 141  --  140   K 4.4  --  4.6  CL 81*  --  83*  CO2 >50*  --  48*  GLUCOSE 157*  --  144*  BUN 23*  --  22*  CREATININE 0.46* 0.77 0.61  CALCIUM 9.2  --  8.9   GFR: Estimated Creatinine Clearance: 97.8 mL/min (by C-G formula based on SCr of 0.61 mg/dL). Liver Function Tests: Recent Labs  Lab 04/16/19 1842 04/17/19 0458  AST 26 25  ALT 13 14  ALKPHOS 48 44  BILITOT 1.1 1.1  PROT 7.7 7.3  ALBUMIN 4.3 3.9   No results for input(s): LIPASE, AMYLASE in the last 168 hours. Recent Labs  Lab 04/16/19 1842  AMMONIA 46*   Coagulation Profile: Recent Labs  Lab 04/16/19 1842  INR 1.0   Cardiac Enzymes: No results for input(s): CKTOTAL, CKMB, CKMBINDEX, TROPONINI in the last 168 hours. BNP (last 3 results) No results for input(s): PROBNP in the last 8760 hours. HbA1C: No results for input(s): HGBA1C in the last 72 hours. CBG: Recent Labs  Lab 04/16/19 1845  GLUCAP 130*   Lipid Profile: No results for input(s): CHOL, HDL, LDLCALC, TRIG, CHOLHDL, LDLDIRECT in the last 72 hours. Thyroid Function Tests: No results for input(s): TSH, T4TOTAL, FREET4, T3FREE, THYROIDAB in the last 72 hours. Anemia Panel: No results for input(s): VITAMINB12, FOLATE, FERRITIN, TIBC, IRON, RETICCTPCT in the last 72 hours. Sepsis Labs: Recent Labs  Lab 04/16/19 1842 04/16/19 2040  PROCALCITON <0.10  --   LATICACIDVEN 0.7 0.8    Recent Results (from the past 240 hour(s))  Blood Culture (routine x 2)     Status: None (Preliminary result)   Collection Time: 04/16/19  6:42 PM   Specimen: BLOOD  Result Value Ref Range Status   Specimen Description BLOOD RIGHT ANTECUBITAL  Final   Special Requests   Final    BOTTLES DRAWN AEROBIC AND ANAEROBIC Blood Culture results may not be optimal due to an excessive volume of blood received in culture bottles   Culture   Final    NO GROWTH < 12 HOURS Performed at [facility], [address], [place], [place] [address]    Report Status PENDING  Incomplete   Blood Culture (routine x 2)     Status: None (Preliminary result)   Collection Time: 04/16/19  6:42 PM   Specimen: BLOOD  Result Value Ref Range Status   Specimen Description BLOOD BLOOD LEFT HAND  Final   Special Requests   Final    BOTTLES DRAWN AEROBIC AND ANAEROBIC Blood Culture results may not be optimal due to an excessive volume of blood received in culture bottles   Culture   Final    NO GROWTH < 12 HOURS Performed at [facility], [address] [place], [place] [address]    Report Status PENDING  Incomplete  Respiratory Panel by RT PCR (Flu A&B, Covid) - Nasopharyngeal Swab     Status: None   Collection Time: 04/16/19  7:37 PM   Specimen: Nasopharyngeal Swab  Result Value Ref Range Status   SARS Coronavirus 2 by RT PCR NEGATIVE NEGATIVE  Final    Comment: (NOTE) SARS-CoV-2 target nucleic acids are NOT DETECTED. The SARS-CoV-2 RNA is generally detectable in upper respiratoy specimens during the acute phase of infection. The lowest concentration of SARS-CoV-2 viral copies this assay can detect is 131 copies/mL. A negative result does not preclude SARS-Cov-2 infection and should not be used as the sole basis for treatment or other patient management decisions. A negative result may occur with  improper specimen collection/handling, submission of specimen other than nasopharyngeal swab, presence of viral mutation(s) within the areas targeted by this assay, and inadequate number of viral copies (<131 copies/mL). A negative result must be combined with clinical observations, patient history, and epidemiological information. The expected result is Negative. Fact Sheet for Patients:  https://www.moore.com/ Fact Sheet for Healthcare Providers:  https://www.young.biz/ This test is not yet ap proved or cleared by the [place] FDA and  has been authorized for detection and/or diagnosis of SARS-CoV-2 by FDA under an Emergency  Use Authorization (EUA). This EUA will remain  in effect (meaning this test can be used) for the duration of the COVID-19 declaration under Section 564(b)(1) of the Act, 21 U.S.C. section 360bbb-3(b)(1), unless the authorization is terminated or revoked sooner.    Influenza A by PCR NEGATIVE NEGATIVE Final   Influenza B by PCR NEGATIVE NEGATIVE Final    Comment: (NOTE) The Xpert Xpress SARS-CoV-2/FLU/RSV assay is intended as an aid in  the diagnosis of influenza from Nasopharyngeal swab specimens and  should not be used as a sole basis for treatment. Nasal washings and  aspirates are unacceptable for Xpert Xpress SARS-CoV-2/FLU/RSV  testing. Fact Sheet for Patients: https://www.moore.com/ Fact Sheet for Healthcare Providers: https://www.young.biz/ This test is not yet approved or cleared by the [place] FDA and  has been authorized for detection and/or diagnosis of SARS-CoV-2 by  FDA under an Emergency Use Authorization (EUA). This EUA will remain  in effect (meaning this test can be used) for the duration of the  Covid-19 declaration under Section 564(b)(1) of the Act, 21  U.S.C. section 360bbb-3(b)(1), unless the authorization is  terminated or revoked. Performed at [facility], [address], [place], [place] [address]          Radiology Studies: CT Head Wo Contrast  Result Date: 04/16/2019 CLINICAL DATA:  Larey Seat yesterday, altered level of consciousness, combative EXAM: CT HEAD WITHOUT CONTRAST TECHNIQUE: Contiguous axial images were obtained from the base of the skull through the vertex without intravenous contrast. COMPARISON:  01/25/2019 FINDINGS: Brain: No acute infarct or hemorrhage. Lateral ventricles and midline structures are stable. No acute extra-axial fluid collections. No mass effect. Low lying cerebellar tonsils again noted. Vascular: No hyperdense vessel or unexpected calcification. Skull: Normal. Negative for  fracture or focal lesion. Sinuses/Orbits: No acute finding. Other: None IMPRESSION: 1. Stable head CT, no acute intracranial process. 2. Stable Chiari malformation. Electronically Signed   By: Sharlet Salina M.D.   On: 04/16/2019 19:59   DG Chest Portable 1 View  Result Date: 04/16/2019 CLINICAL DATA:  Respiratory distress EXAM: PORTABLE CHEST 1 VIEW COMPARISON:  01/24/2019 FINDINGS: 2 frontal views of the chest demonstrate a stable cardiac silhouette. Extensive emphysema is again noted. Prominent vascular shadows at the right lung base, stable since 2015. No airspace disease, effusion, or pneumothorax. No acute bony abnormalities. IMPRESSION: 1. Severe emphysema, no superimposed airspace disease. Electronically Signed   By: Sharlet Salina M.D.   On: 04/16/2019 18:52        Scheduled Meds: . arformoterol  15 mcg Nebulization BID  . budesonide (PULMICORT) nebulizer solution  0.25 mg Nebulization BID  . enoxaparin (LOVENOX) injection  40 mg Subcutaneous Q24H  . ipratropium-albuterol  3 mL Nebulization Q4H  . methylPREDNISolone (SOLU-MEDROL) injection  40 mg Intravenous Q6H   Followed by  . [START ON 04/18/2019] predniSONE  40 mg Oral Q breakfast   Continuous Infusions: . levofloxacin (LEVAQUIN) IV       LOS: 1 day    Time spent: 35 minutes    Tresa Moore, MD [facility] Pager [phone]  If 7PM-7AM, please contact night-coverage 04/17/2019, 11:10 AM

## 2019-04-17 NOTE — ED Notes (Signed)
Resumed care from [place], [place]. Patient is awake and alert, seems appropriate although hard to hear with BiPAP in place. [name] only complaint is dry mouth; informed patient that he cannot have water at this time due to being placed on BiPAP. Fluids running at this time. Lights dimmed for comfort, BiPAP mask readjusted

## 2019-04-17 NOTE — Progress Notes (Signed)
PT Cancellation Note  Patient Details Name: Chris Case MRN: [id number] DOB: 1971/01/22   Cancelled Treatment:    Reason Eval/Treat Not Completed: Medical issues which prohibited therapy.  Went to IC after PT order was put in.  Will await a new order for completion of PT eval.     Ivar Drape 04/17/2019, 1:28 PM   Samul Dada, PT MS Acute Rehab Dept. Number: [facility] [id number] and [facility] [phone]

## 2019-04-17 NOTE — ED Notes (Signed)
Patient provided with urinal, has not made urine since this RN took over at 0300. Encouraged patient to try to use restroom

## 2019-04-17 NOTE — Progress Notes (Signed)
In to do mouth care on pt,  Pt starts trying to pull BIPAP off and get out of bed.  Tried reorienting pt to surroundings.  Pt says he is going home.  Precedex has been started.  Bed alarm engaged.  Will continue to monitor.

## 2019-04-17 NOTE — ED Notes (Signed)
Patient is significantly more awake, breathing over the BiPAP. EDP notified, another ABG will be performed to determine if patient can come off BiPAP at this time

## 2019-04-17 NOTE — ED Notes (Signed)
Performed oral care to pt and pt drank a cup of water

## 2019-04-18 ENCOUNTER — Inpatient Hospital Stay: Payer: Medicaid Other

## 2019-04-18 LAB — URINE CULTURE: Culture: NO GROWTH

## 2019-04-18 LAB — BLOOD GAS, ARTERIAL
Acid-Base Excess: 20 mmol/L — ABNORMAL HIGH (ref 0.0–2.0)
Bicarbonate: 50.8 mmol/L — ABNORMAL HIGH (ref 20.0–28.0)
FIO2: 0.36
O2 Saturation: 97.4 %
Patient temperature: 37
pCO2 arterial: 92 mmHg (ref 32.0–48.0)
pH, Arterial: 7.35 (ref 7.350–7.450)
pO2, Arterial: 100 mmHg (ref 83.0–108.0)

## 2019-04-18 LAB — BASIC METABOLIC PANEL
Anion gap: 8 (ref 5–15)
BUN: 16 mg/dL (ref 6–20)
CO2: 44 mmol/L — ABNORMAL HIGH (ref 22–32)
Calcium: 9.2 mg/dL (ref 8.9–10.3)
Chloride: 86 mmol/L — ABNORMAL LOW (ref 98–111)
Creatinine, Ser: 0.49 mg/dL — ABNORMAL LOW (ref 0.61–1.24)
GFR calc Af Amer: >60 mL/min (ref >60)
GFR calc non Af Amer: >60 mL/min (ref >60)
Glucose, Bld: 153 mg/dL — ABNORMAL HIGH (ref 70–99)
Potassium: 4.3 mmol/L (ref 3.5–5.1)
Sodium: 138 mmol/L (ref 135–145)

## 2019-04-18 LAB — CBC WITH DIFFERENTIAL/PLATELET
Abs Immature Granulocytes: 0.04 10*3/uL (ref 0.00–0.07)
Basophils Absolute: 0 10*3/uL (ref 0.0–0.1)
Basophils Relative: 0 %
Eosinophils Absolute: 0 10*3/uL (ref 0.0–0.5)
Eosinophils Relative: 0 %
HCT: 32.9 % — ABNORMAL LOW (ref 39.0–52.0)
Hemoglobin: 9.9 g/dL — ABNORMAL LOW (ref 13.0–17.0)
Immature Granulocytes: 1 %
Lymphocytes Relative: 8 %
Lymphs Abs: 0.3 10*3/uL — ABNORMAL LOW (ref 0.7–4.0)
MCH: 30.5 pg (ref 26.0–34.0)
MCHC: 30.1 g/dL (ref 30.0–36.0)
MCV: 101.2 fL — ABNORMAL HIGH (ref 80.0–100.0)
Monocytes Absolute: 0.3 10*3/uL (ref 0.1–1.0)
Monocytes Relative: 6 %
Neutro Abs: 3.5 10*3/uL (ref 1.7–7.7)
Neutrophils Relative %: 85 %
Platelets: 89 10*3/uL — ABNORMAL LOW (ref 150–400)
RBC: 3.25 MIL/uL — ABNORMAL LOW (ref 4.22–5.81)
RDW: 12.5 % (ref 11.5–15.5)
WBC: 4.1 10*3/uL (ref 4.0–10.5)
nRBC: 0 % (ref 0.0–0.2)

## 2019-04-18 LAB — HEPATITIS B SURFACE ANTIGEN: Hepatitis B Surface Ag: NONREACTIVE

## 2019-04-18 MED ORDER — ENSURE ENLIVE PO LIQD
237.0000 mL | Freq: Two times a day (BID) | ORAL | Status: DC
  Administered 2019-04-20: 237 mL via ORAL

## 2019-04-18 MED ORDER — PREDNISONE 50 MG PO TABS
50.0000 mg | ORAL_TABLET | Freq: Every day | ORAL | Status: DC
  Administered 2019-04-19 – 2019-04-20 (×2): 50 mg via ORAL
  Filled 2019-04-18: qty 5
  Filled 2019-04-18: qty 1

## 2019-04-18 MED ORDER — METHYLPREDNISOLONE SODIUM SUCC 40 MG IJ SOLR
40.0000 mg | Freq: Three times a day (TID) | INTRAMUSCULAR | Status: DC
  Administered 2019-04-18 (×2): 40 mg via INTRAVENOUS
  Filled 2019-04-18 (×2): qty 1

## 2019-04-18 NOTE — Progress Notes (Signed)
Initial Nutrition Assessment  RD working remotely.  DOCUMENTATION CODES:   Not applicable  INTERVENTION:  Provide Ensure Enlive po BID once patient able to come of BiPAP long enough for PO intake, each supplement provides 350 kcal and 20 grams of protein.  NUTRITION DIAGNOSIS:   Increased nutrient needs related to catabolic illness(COPD) as evidenced by estimated needs.  GOAL:   Patient will meet greater than or equal to 90% of their needs  MONITOR:   PO intake, Supplement acceptance, Labs, Weight trends, I & O's  REASON FOR ASSESSMENT:   Consult Assessment of nutrition requirement/status  ASSESSMENT:   49 year old male with PMHx of emphysema of lung, COPD, TB admitted with acute exacerbation of COPD and acute metabolic encephalopathy.   Patient currently documented as being on BiPAP so he will be unable to provide nutrition/weight history over the phone. Will monitor for adequacy of PO intake. Patient has increased calorie/protein needs due to catabolic nature of COPD.  According to chart patient was 61.9 kg on 01/24/2019. He is currently documented to be 55.1 kg (121.47 lbs). If weight history is correct he has lost 6.8 kg (11% body weight) over the past 3 month, which is significant for time frame.  Medications reviewed and include: Solu-Medrol 40 mg Q8hrs IV, Precedex gtt, Levaquin.  Labs reviewed: CBG 145, CO2 44, Creatinine 0.49.  Unable to determine if patient meets criteria for malnutrition at this time.  NUTRITION - FOCUSED PHYSICAL EXAM:  Unable to complete as RD working remotely.  Diet Order:   Diet Order            Diet regular Room service appropriate? Yes; Fluid consistency: Thin; Fluid restriction: 2000 mL Fluid  Diet effective now             EDUCATION NEEDS:   No education needs have been identified at this time  Skin:  Skin Assessment: Reviewed RN Assessment  Last BM:  04/17/2019  Height:   Ht Readings from Last 1 Encounters:  04/17/19  5\' 6"  (1.676 m)   Weight:   Wt Readings from Last 1 Encounters:  04/17/19 55.1 kg   Ideal Body Weight:  64.5 kg  BMI:  Body mass index is 19.61 kg/m.  Estimated Nutritional Needs:   Kcal:  1700-1900  Protein:  85-95 grams  Fluid:  1.7-1.9 L/day  04/19/19, MS, RD, LDN Pager number available on Amion

## 2019-04-18 NOTE — Consult Note (Signed)
CRITICAL CARE PROGRESS NOTE    Name: Chris Case MRN: [id number] DOB: September 19, 1970     LOS: 2   SUBJECTIVE FINDINGS & SIGNIFICANT EVENTS   Patient description:  49 yo with hx of Advanced COPD GOLD 3D with FEV1 0.5L and DLCO at 30% and chronic hypoxemia on 2LNC at home, stopped smoking 2 years ago but before that was chain smoker with total 96pack years.  Recent [facility] admission in 01/2019. Admited with hypercapnic encephalopathy and a/c hypoxemia.    Lines / Drains: PIVx2  Cultures / Sepsis markers:  MRSA nasal PCR negative Blood cultures negative COVID19 negative Influenza A&B negatigve   Antibiotics: Levoquin   Protocols / Consultants: PCCM and hospitalist     PAST MEDICAL HISTORY   Past Medical History:  Diagnosis Date  . COPD (chronic obstructive pulmonary disease) (HCC)   . Emphysema lung (HCC)   . 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 APPENDECTOMY  late 1980's  . LAPAROTOMY N/A 12/06/2016   Procedure: EXPLORATORY LAPAROTOMY- SBO;  Surgeon: [name], MD;  Location: [facility] ORS;  Service: General;  Laterality: N/A;     FAMILY HISTORY   Family History  Problem Relation Age of Onset  . COPD Mother      SOCIAL HISTORY   Social History   Tobacco Use  . Smoking status: Former Smoker    Packs/day: 3.00    Years: 20.00    Pack years: 60.00    Types: Cigarettes    Quit date: 04/11/2016    Years since quitting: 3.0  . Smokeless tobacco: Never Used  Substance Use Topics  . Alcohol use: No    Comment: former drinker-1 pint of gin/week-quit 2015  . Drug use: Yes    Types: Marijuana    Comment: one joint /day x 2 y     MEDICATIONS   Current Medication:  Current  Facility-Administered Medications:  .  acetaminophen (TYLENOL) tablet 650 mg, 650 mg, Oral, Q6H PRN **OR** acetaminophen (TYLENOL) suppository 650 mg, 650 mg, Rectal, Q6H PRN, Mikeal Hawthorne, Mohammad L, MD .  arformoterol (BROVANA) nebulizer solution 15 mcg, 15 mcg, Nebulization, BID, Georgeann Oppenheim, Sudheer B, MD, 15 mcg at 04/18/19 0746 .  budesonide (PULMICORT) nebulizer solution 0.25 mg, 0.25 mg, Nebulization, BID, Sreenath, Sudheer B, MD, 0.25 mg at 04/18/19 0733 .  chlorhexidine (PERIDEX) 0.12 % solution 15 mL, 15 mL, Mouth Rinse, BID, Sreenath, Sudheer B, MD, 15 mL at 04/18/19 0959 .  Chlorhexidine Gluconate Cloth 2 % PADS 6 each, 6 each, Topical, Daily, Lolita Patella B, MD, 6 each at 04/18/19 1342 .  dexmedetomidine (PRECEDEX) 400 MCG/100ML (4 mcg/mL) infusion, 0.4-1.2 mcg/kg/hr, Intravenous, Titrated, Harlon Ditty D, NP, Last Rate: 8.27 mL/hr at 04/18/19 1310, 0.6 mcg/kg/hr at 04/18/19 1310 .  enoxaparin (LOVENOX) injection 40 mg, 40 mg, Subcutaneous, Q24H, Garba, Mohammad L, MD, 40 mg at 04/17/19 2331 .  [START ON 04/19/2019] feeding supplement (ENSURE ENLIVE) (ENSURE ENLIVE) liquid 237 mL, 237 mL, Oral, BID BM, Sreenath, Sudheer B, MD .  ipratropium-albuterol (DUONEB) 0.5-2.5 (3) MG/3ML nebulizer solution 3 mL, 3 mL, Nebulization, Q4H, Sreenath, Sudheer B, MD, 3 mL at 04/18/19 1103 .  levofloxacin (LEVAQUIN) IVPB 750 mg, 750 mg, Intravenous, Q24H, Sreenath, Sudheer B, MD, Last Rate: 100 mL/hr at 04/17/19 1722, 750 mg at 04/17/19 1722 .  MEDLINE mouth rinse, 15 mL, Mouth Rinse, q12n4p, Sreenath, Jonelle Sports, MD,  15 mL at 04/17/19 1539 .  methylPREDNISolone sodium succinate (SOLU-MEDROL) 40 mg/mL injection 40 mg, 40 mg, Intravenous, Q8H, Sreenath, Sudheer B, MD, 40 mg at 04/18/19 1339 .  ondansetron (ZOFRAN) tablet 4 mg, 4 mg, Oral, Q6H PRN **OR** ondansetron (ZOFRAN) injection 4 mg, 4 mg, Intravenous, Q6H PRN, Elwyn Reach, MD    ALLERGIES   Patient has no known allergies.    REVIEW OF  SYSTEMS    10 point ROS negative except as per subjective findings  PHYSICAL EXAMINATION   Vital Signs: Temp:  [97.1 F (36.2 C)-98.6 F (37 C)] 98.1 F (36.7 C) (02/14 1400) Pulse Rate:  [56-104] 56 (02/14 1400) Resp:  [14-27] 17 (02/14 1400) BP: (101-168)/(60-119) 134/96 (02/14 1400) SpO2:  [90 %-100 %] 98 % (02/14 1400) FiO2 (%):  [30 %] 30 % (02/14 0700)  GENERAL:age appropirate dishoveled  HEAD: Normocephalic, atraumatic.  EYES: Pupils equal, round, reactive to light.  No scleral icterus.  MOUTH: Moist mucosal membrane. NECK: Supple. No thyromegaly. No nodules. No JVD.  PULMONARY: mild rhonchi CARDIOVASCULAR: S1 and S2. Regular rate and rhythm. No murmurs, rubs, or gallops.  GASTROINTESTINAL: Soft, nontender, non-distended. No masses. Positive bowel sounds. No hepatosplenomegaly.  MUSCULOSKELETAL: No swelling, clubbing, or edema.  NEUROLOGIC: Mild distress due to acute illness SKIN:intact,warm,dry   PERTINENT DATA     Infusions: . dexmedetomidine (PRECEDEX) IV infusion 0.6 mcg/kg/hr (04/18/19 1310)  . levofloxacin (LEVAQUIN) IV 750 mg (04/17/19 1722)   Scheduled Medications: . arformoterol  15 mcg Nebulization BID  . budesonide (PULMICORT) nebulizer solution  0.25 mg Nebulization BID  . chlorhexidine  15 mL Mouth Rinse BID  . Chlorhexidine Gluconate Cloth  6 each Topical Daily  . enoxaparin (LOVENOX) injection  40 mg Subcutaneous Q24H  . [START ON 04/19/2019] feeding supplement (ENSURE ENLIVE)  237 mL Oral BID BM  . ipratropium-albuterol  3 mL Nebulization Q4H  . mouth rinse  15 mL Mouth Rinse q12n4p  . methylPREDNISolone (SOLU-MEDROL) injection  40 mg Intravenous Q8H   PRN Medications: acetaminophen **OR** acetaminophen, ondansetron **OR** ondansetron (ZOFRAN) IV Hemodynamic parameters:   Intake/Output: 02/13 0701 - 02/14 0700 In: 962.3 [I.V.:662.3; IV Piggyback:300] Out: 850 [Urine:850]  Ventilator  Settings: FiO2 (%):  [30 %] 30 %    LAB  RESULTS:  Basic Metabolic Panel: Recent Labs  Lab 04/16/19 1842 04/16/19 1842 04/17/19 0127 04/17/19 0458 04/18/19 0507  NA 141  --   --  140 138  K 4.4   < >  --  4.6 4.3  CL 81*  --   --  83* 86*  CO2 >50*  --   --  48* 44*  GLUCOSE 157*  --   --  144* 153*  BUN 23*  --   --  22* 16  CREATININE 0.46*  --  0.77 0.61 0.49*  CALCIUM 9.2  --   --  8.9 9.2   < > = values in this interval not displayed.   Liver Function Tests: Recent Labs  Lab 04/16/19 1842 04/17/19 0458  AST 26 25  ALT 13 14  ALKPHOS 48 44  BILITOT 1.1 1.1  PROT 7.7 7.3  ALBUMIN 4.3 3.9   No results for input(s): LIPASE, AMYLASE in the last 168 hours. Recent Labs  Lab 04/16/19 1842  AMMONIA 46*   CBC: Recent Labs  Lab 04/16/19 1842 04/17/19 0127 04/17/19 0458 04/18/19 0507  WBC 7.4 6.3 5.2 4.1  NEUTROABS 5.7  --   --  3.5  HGB 12.2* 11.1*  10.8* 9.9*  HCT 41.7 38.2* 35.8* 32.9*  MCV 106.9* 106.1* 104.1* 101.2*  PLT 110* 106* 104* 89*   Cardiac Enzymes: No results for input(s): CKTOTAL, CKMB, CKMBINDEX, TROPONINI in the last 168 hours. BNP: Invalid input(s): POCBNP CBG: Recent Labs  Lab 04/16/19 1845 04/17/19 1252  GLUCAP 130* 145*     IMAGING RESULTS:  Imaging: CT Head Wo Contrast  Result Date: 04/16/2019 CLINICAL DATA:  Larey Seat yesterday, altered level of consciousness, combative EXAM: CT HEAD WITHOUT CONTRAST TECHNIQUE: Contiguous axial images were obtained from the base of the skull through the vertex without intravenous contrast. COMPARISON:  01/25/2019 FINDINGS: Brain: No acute infarct or hemorrhage. Lateral ventricles and midline structures are stable. No acute extra-axial fluid collections. No mass effect. Low lying cerebellar tonsils again noted. Vascular: No hyperdense vessel or unexpected calcification. Skull: Normal. Negative for fracture or focal lesion. Sinuses/Orbits: No acute finding. Other: None IMPRESSION: 1. Stable head CT, no acute intracranial process. 2. Stable  Chiari malformation. Electronically Signed   By: Sharlet Salina M.D.   On: 04/16/2019 19:59   DG Chest Portable 1 View  Result Date: 04/16/2019 CLINICAL DATA:  Respiratory distress EXAM: PORTABLE CHEST 1 VIEW COMPARISON:  01/24/2019 FINDINGS: 2 frontal views of the chest demonstrate a stable cardiac silhouette. Extensive emphysema is again noted. Prominent vascular shadows at the right lung base, stable since 2015. No airspace disease, effusion, or pneumothorax. No acute bony abnormalities. IMPRESSION: 1. Severe emphysema, no superimposed airspace disease. Electronically Signed   By: Sharlet Salina M.D.   On: 04/16/2019 18:52       ASSESSMENT AND PLAN    -Multidisciplinary rounds held today  Acute Hypercanic Hypoxic Respiratory Failure -patient with severe acute COPD exacerbation -agree with prednisone -agree with levoquin 750 po daily  -patient with FEV1 - 0.5L and DLCO 30% - will obtain qualification for home BIPAP - profile sent to [facility] today  -BIPAP setting reviewed - will increase driving pressure, repeat ABG at 1800 today  -continue Bronchodilator Therapy -IS at bedside  There is mild RLL atelectasis - patient may benefit from recruitment with [facility]   Chronic thrombocytopenia  - HCV 2018 negative - will repeat   - HBSag  - HIV negative  - no medications on MAR to cause low plt  ID -continue IV abx as prescibed -follow up cultures  GI/Nutrition GI PROPHYLAXIS as indicated DIET-->TF's as tolerated Constipation protocol as indicated  ENDO - ICU hypoglycemic\Hyperglycemia protocol -check FSBS per protocol   ELECTROLYTES -follow labs as needed -replace as needed -pharmacy consultation   DVT/GI PRX ordered -SCDs  TRANSFUSIONS AS NEEDED MONITOR FSBS ASSESS the need for LABS as needed   Critical care provider statement:    Critical care time (minutes):  33   Critical care time was exclusive of:  Separately billable procedures and treating other  patients   Critical care was necessary to treat or prevent imminent or life-threatening deterioration of the following conditions:  acute hypercapnic respiratory failure, acute severe copd fexacerbation, multiople comorbid conditions   Critical care was time spent personally by me on the following activities:  Development of treatment plan with patient or surrogate, discussions with consultants, evaluation of patient's response to treatment, examination of patient, obtaining history from patient or surrogate, ordering and performing treatments and interventions, ordering and review of laboratory studies and re-evaluation of patient's condition.  I assumed direction of critical care for this patient from another provider in my specialty: no    This document was prepared  using Systems analyst and may include unintentional dictation errors.    Ottie Glazier, M.D.  Division of [place]

## 2019-04-18 NOTE — Progress Notes (Signed)
Patient was awake and talking on the phone, called me in, I talked with him and offered prayer.

## 2019-04-18 NOTE — Progress Notes (Signed)
PROGRESS NOTE    Chris Case  OXB:[id number] DOB: 1970/07/28 DOA: 04/16/2019 PCP: [facility]   Brief Narrative:  HPI: Chris Case is a 49 y.o. male with medical history significant of COPD, medication noncompliance, who was brought in by EMS unresponsive from home. Patient apparently was confused and combative at home. He was supposed to have CPAP but patient was refusing that. EMS was able to bring him in on nonrebreather bag. His initial oxygen saturation at home was around 50% by EMS. On the nonrebreather back he was up to 100%. He was lethargic still at my exam. When he arrived the ER his completely obtunded. Currently on BiPAP. He has received IV steroids nebulizer treatment and still on BiPAP. His COVID-19 screen is negative. Patient is being admitted with acute on chronic respiratory failure secondary to COPD exacerbation.  2/13: Patient seen and examined.  Remains on BiPAP in the emergency department.  Mentating much better now.  Alert and oriented x4.  Instructed the ED nurse to have respiratory attempts to titrate off noninvasive positive pressure ventilation.  2/14: Patient seen and examined.  Became agitated last night.  Try to take BiPAP mask of.  Encephalopathic.  Started on Precedex infusion and placed back on BiPAP.  Mentating much better this morning.  Intensivist consulted for respiratory failure and encephalopathy requiring Precedex gtt.   Assessment & Plan:   Principal Problem:   Acute on chronic respiratory failure with hypoxia and hypercapnia (HCC) Active Problems:   COPD exacerbation (HCC)   Thrombocytopenia (HCC)  Acute exacerbation of COPD Acute on chronic hypercarbic and hypoxic respiratory failure Acute metabolic encephalopathy Agitation noted overnight Started on Precedex infusion Intensivist consulted, discussed case with Dr. Lanney Gins Plan: Continue stepdown status Attempt to wean from BiPAP IV steroids for today, consider  transition to PO by tomorrow IV Levaquin 750 mg daily DuoNeb every 4 hours Brovana twice daily Pulmicort twice daily Incentive spirometry/flutter valve use  Thrombocytopenia Appears chronic No indication of bleeding Monitor while on chemoprophylaxis   DVT prophylaxis: Lovenox Code Status: Full Family Communication: None today Disposition Plan: Anticipate home, 48 to 72 hours  Consultants:   None  Procedures:   NIPPV  Antimicrobials:   Levaquin (04/16/19-  )   Subjective: Seen and examined in stepdown bed On BiPAP On Precedex infusion Mental status appears improved  Objective: Vitals:   04/18/19 1300 04/18/19 1400 04/18/19 1500 04/18/19 1514  BP: 118/72 (!) 134/96 (!) 141/101   Pulse: 64 (!) 56 (!) 59 (!) 58  Resp: 14 17 18 16   Temp:  98.1 F (36.7 C)    TempSrc:  Axillary    SpO2: 95% 98% 95% 95%  Weight:      Height:        Intake/Output Summary (Last 24 hours) at 04/18/2019 1547 Last data filed at 04/18/2019 1511 Gross per 24 hour  Intake 616.51 ml  Output 1150 ml  Net -533.49 ml   Filed Weights   04/16/19 1830 04/17/19 1252  Weight: 61.9 kg 55.1 kg    Examination:  General exam: Appears calm and comfortable  Respiratory system: Diffusely decreased lung sounds bilaterally.  No obvious wheeze.  Normal work of breathing Cardiovascular system: S1, S2 heard, RRR, no murmurs, no pedal edema  gastrointestinal system: Abdomen is nondistended, soft and nontender. No organomegaly or masses felt. Normal bowel sounds heard. Central nervous system: Alert and oriented. No focal neurological deficits. Extremities: Symmetric 5 x 5 power. Skin: No rashes, lesions or ulcers Psychiatry: Judgement  and insight appear normal. Mood & affect appropriate.     Data Reviewed: I have personally reviewed following labs and imaging studies  CBC: Recent Labs  Lab 04/16/19 1842 04/17/19 0127 04/17/19 0458 04/18/19 0507  WBC 7.4 6.3 5.2 4.1  NEUTROABS 5.7  --   --   3.5  HGB 12.2* 11.1* 10.8* 9.9*  HCT 41.7 38.2* 35.8* 32.9*  MCV 106.9* 106.1* 104.1* 101.2*  PLT 110* 106* 104* 89*   Basic Metabolic Panel: Recent Labs  Lab 04/16/19 1842 04/17/19 0127 04/17/19 0458 04/18/19 0507  NA 141  --  140 138  K 4.4  --  4.6 4.3  CL 81*  --  83* 86*  CO2 >50*  --  48* 44*  GLUCOSE 157*  --  144* 153*  BUN 23*  --  22* 16  CREATININE 0.46* 0.77 0.61 0.49*  CALCIUM 9.2  --  8.9 9.2   GFR: Estimated Creatinine Clearance: 87.1 mL/min (A) (by C-G formula based on SCr of 0.49 mg/dL (L)). Liver Function Tests: Recent Labs  Lab 04/16/19 1842 04/17/19 0458  AST 26 25  ALT 13 14  ALKPHOS 48 44  BILITOT 1.1 1.1  PROT 7.7 7.3  ALBUMIN 4.3 3.9   No results for input(s): LIPASE, AMYLASE in the last 168 hours. Recent Labs  Lab 04/16/19 1842  AMMONIA 46*   Coagulation Profile: Recent Labs  Lab 04/16/19 1842  INR 1.0   Cardiac Enzymes: No results for input(s): CKTOTAL, CKMB, CKMBINDEX, TROPONINI in the last 168 hours. BNP (last 3 results) No results for input(s): PROBNP in the last 8760 hours. HbA1C: No results for input(s): HGBA1C in the last 72 hours. CBG: Recent Labs  Lab 04/16/19 1845 04/17/19 1252  GLUCAP 130* 145*   Lipid Profile: No results for input(s): CHOL, HDL, LDLCALC, TRIG, CHOLHDL, LDLDIRECT in the last 72 hours. Thyroid Function Tests: No results for input(s): TSH, T4TOTAL, FREET4, T3FREE, THYROIDAB in the last 72 hours. Anemia Panel: No results for input(s): VITAMINB12, FOLATE, FERRITIN, TIBC, IRON, RETICCTPCT in the last 72 hours. Sepsis Labs: Recent Labs  Lab 04/16/19 1842 04/16/19 2040  PROCALCITON <0.10  --   LATICACIDVEN 0.7 0.8    Recent Results (from the past 240 hour(s))  Blood Culture (routine x 2)     Status: None (Preliminary result)   Collection Time: 04/16/19  6:42 PM   Specimen: BLOOD  Result Value Ref Range Status   Specimen Description BLOOD RIGHT ANTECUBITAL  Final   Special Requests   Final      BOTTLES DRAWN AEROBIC AND ANAEROBIC Blood Culture results may not be optimal due to an excessive volume of blood received in culture bottles   Culture   Final    NO GROWTH 2 DAYS Performed at [facility], [address], [place], [place] [address]    Report Status PENDING  Incomplete  Blood Culture (routine x 2)     Status: None (Preliminary result)   Collection Time: 04/16/19  6:42 PM   Specimen: BLOOD  Result Value Ref Range Status   Specimen Description BLOOD BLOOD LEFT HAND  Final   Special Requests   Final    BOTTLES DRAWN AEROBIC AND ANAEROBIC Blood Culture results may not be optimal due to an excessive volume of blood received in culture bottles   Culture   Final    NO GROWTH 2 DAYS Performed at [facility], [address], [place], [place] [address]    Report Status PENDING  Incomplete  Respiratory Panel by RT PCR (Flu A&B, Covid) - Nasopharyngeal Swab     Status: None   Collection Time: 04/16/19  7:37 PM   Specimen: Nasopharyngeal Swab  Result Value Ref Range Status   SARS Coronavirus 2 by RT PCR NEGATIVE NEGATIVE Final    Comment: (NOTE) SARS-CoV-2 target nucleic acids are NOT DETECTED. The SARS-CoV-2 RNA is generally detectable in upper respiratoy specimens during the acute phase of infection. The lowest concentration of SARS-CoV-2 viral copies this assay can detect is 131 copies/mL. A negative result does not preclude SARS-Cov-2 infection and should not be used as the sole basis for treatment or other patient management decisions. A negative result may occur with  improper specimen collection/handling, submission of specimen other than nasopharyngeal swab, presence of viral mutation(s) within the areas targeted by this assay, and inadequate number of viral copies (<131 copies/mL). A negative result must be combined with clinical observations, patient history, and epidemiological information. The expected result is Negative. Fact Sheet for  Patients:  https://www.moore.com/ Fact Sheet for Healthcare Providers:  https://www.young.biz/ This test is not yet ap proved or cleared by the [place] FDA and  has been authorized for detection and/or diagnosis of SARS-CoV-2 by FDA under an Emergency Use Authorization (EUA). This EUA will remain  in effect (meaning this test can be used) for the duration of the COVID-19 declaration under Section 564(b)(1) of the Act, 21 U.S.C. section 360bbb-3(b)(1), unless the authorization is terminated or revoked sooner.    Influenza A by PCR NEGATIVE NEGATIVE Final   Influenza B by PCR NEGATIVE NEGATIVE Final    Comment: (NOTE) The Xpert Xpress SARS-CoV-2/FLU/RSV assay is intended as an aid in  the diagnosis of influenza from Nasopharyngeal swab specimens and  should not be used as a sole basis for treatment. Nasal washings and  aspirates are unacceptable for Xpert Xpress SARS-CoV-2/FLU/RSV  testing. Fact Sheet for Patients: https://www.moore.com/ Fact Sheet for Healthcare Providers: https://www.young.biz/ This test is not yet approved or cleared by the [place] FDA and  has been authorized for detection and/or diagnosis of SARS-CoV-2 by  FDA under an Emergency Use Authorization (EUA). This EUA will remain  in effect (meaning this test can be used) for the duration of the  Covid-19 declaration under Section 564(b)(1) of the Act, 21  U.S.C. section 360bbb-3(b)(1), unless the authorization is  terminated or revoked. Performed at [facility], [address], [place], [place] [address]   Urine culture     Status: None   Collection Time: 04/16/19  8:51 PM   Specimen: In/Out Cath Urine  Result Value Ref Range Status   Specimen Description   Final    IN/OUT CATH URINE Performed at [facility], [address], [place], [place] [address]    Special Requests   Final    NONE Performed at  [facility], [address] [place], [place] [address]    Culture   Final    NO GROWTH Performed at [facility], [address] [place], [place] [address]    Report Status 04/18/2019 FINAL  Final  MRSA PCR Screening     Status: None   Collection Time: 04/17/19 12:56 PM   Specimen: Nasopharyngeal  Result Value Ref Range Status   MRSA by PCR NEGATIVE NEGATIVE Final    Comment:        The GeneXpert MRSA Assay (FDA approved for NASAL specimens only), is one component of a comprehensive MRSA colonization surveillance program.  It is not intended to diagnose MRSA infection nor to guide or monitor treatment for MRSA infections. Performed at [facility], [address], [place], [place] [address]          Radiology Studies: CT Head Wo Contrast  Result Date: 04/16/2019 CLINICAL DATA:  Larey Seat yesterday, altered level of consciousness, combative EXAM: CT HEAD WITHOUT CONTRAST TECHNIQUE: Contiguous axial images were obtained from the base of the skull through the vertex without intravenous contrast. COMPARISON:  01/25/2019 FINDINGS: Brain: No acute infarct or hemorrhage. Lateral ventricles and midline structures are stable. No acute extra-axial fluid collections. No mass effect. Low lying cerebellar tonsils again noted. Vascular: No hyperdense vessel or unexpected calcification. Skull: Normal. Negative for fracture or focal lesion. Sinuses/Orbits: No acute finding. Other: None IMPRESSION: 1. Stable head CT, no acute intracranial process. 2. Stable Chiari malformation. Electronically Signed   By: Sharlet Salina M.D.   On: 04/16/2019 19:59   DG Chest Port 1 View  Result Date: 04/18/2019 CLINICAL DATA:  Pulmonary disease. Acute respiratory failure. Encephalopathy. EXAM: PORTABLE CHEST 1 VIEW COMPARISON:  Chest x-rays dated 04/16/2019 and 01/24/2019. FINDINGS: Lungs are hyperexpanded. Chronic scarring/atelectasis at the RIGHT lung base. No new opacity to suggest a  developing pneumonia or pulmonary edema. No pleural effusion or pneumothorax is seen. Borderline cardiomegaly, stable. Osseous structures about the chest are unremarkable. IMPRESSION: 1. No acute findings. No evidence of pneumonia or pulmonary edema. 2. Hyperexpanded lungs indicating COPD. Electronically Signed   By: Bary Richard M.D.   On: 04/18/2019 15:04   DG Chest Portable 1 View  Result Date: 04/16/2019 CLINICAL DATA:  Respiratory distress EXAM: PORTABLE CHEST 1 VIEW COMPARISON:  01/24/2019 FINDINGS: 2 frontal views of the chest demonstrate a stable cardiac silhouette. Extensive emphysema is again noted. Prominent vascular shadows at the right lung base, stable since 2015. No airspace disease, effusion, or pneumothorax. No acute bony abnormalities. IMPRESSION: 1. Severe emphysema, no superimposed airspace disease. Electronically Signed   By: Sharlet Salina M.D.   On: 04/16/2019 18:52        Scheduled Meds: . arformoterol  15 mcg Nebulization BID  . budesonide (PULMICORT) nebulizer solution  0.25 mg Nebulization BID  . chlorhexidine  15 mL Mouth Rinse BID  . Chlorhexidine Gluconate Cloth  6 each Topical Daily  . enoxaparin (LOVENOX) injection  40 mg Subcutaneous Q24H  . [START ON 04/19/2019] feeding supplement (ENSURE ENLIVE)  237 mL Oral BID BM  . ipratropium-albuterol  3 mL Nebulization Q4H  . mouth rinse  15 mL Mouth Rinse q12n4p  . [START ON 04/19/2019] predniSONE  50 mg Oral Q breakfast   Continuous Infusions: . levofloxacin (LEVAQUIN) IV 750 mg (04/17/19 1722)     LOS: 2 days    Time spent: 35 minutes    Tresa Moore, MD [facility] Pager [phone]  If 7PM-7AM, please contact night-coverage 04/18/2019, 3:47 PM

## 2019-04-18 NOTE — Progress Notes (Signed)
Patient on bipap tolerating well. Placed small gel cushion at bridge of nose due to pressure area. Patient initially had a smaller mask when setup but was changed at some point and now having to  Pull straps tighter. Patient states the larger is better because he is not as claustrophobic

## 2019-04-19 LAB — CBC WITH DIFFERENTIAL/PLATELET
Abs Immature Granulocytes: 0.04 10*3/uL (ref 0.00–0.07)
Basophils Absolute: 0 10*3/uL (ref 0.0–0.1)
Basophils Relative: 0 %
Eosinophils Absolute: 0 10*3/uL (ref 0.0–0.5)
Eosinophils Relative: 0 %
HCT: 32.7 % — ABNORMAL LOW (ref 39.0–52.0)
Hemoglobin: 10 g/dL — ABNORMAL LOW (ref 13.0–17.0)
Immature Granulocytes: 1 %
Lymphocytes Relative: 18 %
Lymphs Abs: 1.3 10*3/uL (ref 0.7–4.0)
MCH: 31.1 pg (ref 26.0–34.0)
MCHC: 30.6 g/dL (ref 30.0–36.0)
MCV: 101.6 fL — ABNORMAL HIGH (ref 80.0–100.0)
Monocytes Absolute: 0.8 10*3/uL (ref 0.1–1.0)
Monocytes Relative: 12 %
Neutro Abs: 5 10*3/uL (ref 1.7–7.7)
Neutrophils Relative %: 69 %
Platelets: 97 10*3/uL — ABNORMAL LOW (ref 150–400)
RBC: 3.22 MIL/uL — ABNORMAL LOW (ref 4.22–5.81)
RDW: 12.5 % (ref 11.5–15.5)
WBC: 7.1 10*3/uL (ref 4.0–10.5)
nRBC: 0 % (ref 0.0–0.2)

## 2019-04-19 LAB — BASIC METABOLIC PANEL
Anion gap: 7 (ref 5–15)
BUN: 20 mg/dL (ref 6–20)
CO2: 46 mmol/L — ABNORMAL HIGH (ref 22–32)
Calcium: 8.7 mg/dL — ABNORMAL LOW (ref 8.9–10.3)
Chloride: 84 mmol/L — ABNORMAL LOW (ref 98–111)
Creatinine, Ser: 0.68 mg/dL (ref 0.61–1.24)
GFR calc Af Amer: >60 mL/min (ref >60)
GFR calc non Af Amer: >60 mL/min (ref >60)
Glucose, Bld: 93 mg/dL (ref 70–99)
Potassium: 3.7 mmol/L (ref 3.5–5.1)
Sodium: 137 mmol/L (ref 135–145)

## 2019-04-19 LAB — HEPATITIS C ANTIBODY: HCV Ab: NONREACTIVE

## 2019-04-19 MED ORDER — ALBUTEROL SULFATE (2.5 MG/3ML) 0.083% IN NEBU: 2.5000 mg | INHALATION_SOLUTION | RESPIRATORY_TRACT | Status: DC | PRN

## 2019-04-19 MED ORDER — IPRATROPIUM-ALBUTEROL 0.5-2.5 (3) MG/3ML IN SOLN
3.0000 mL | Freq: Four times a day (QID) | RESPIRATORY_TRACT | Status: DC
  Administered 2019-04-19 – 2019-04-20 (×5): 3 mL via RESPIRATORY_TRACT
  Filled 2019-04-19 (×5): qty 3

## 2019-04-19 NOTE — Evaluation (Addendum)
Occupational Therapy Evaluation Patient Details Name: Chris Case MRN: [id number] DOB: 18-Jan-1971 Today's Date: 04/19/2019    History of Present Illness Pt is a 49 y.o. male with medical history significant of COPD, medication noncompliance, who was brought in by EMS unresponsive from home. Patient apparently was confused and combative at home. He was supposed to have CPAP but patient was refusing that. EMS was able to bring him in on nonrebreather bag. His initial oxygen saturation at home was around 50% by EMS. On the nonrebreather back he was up to 100%.  When he arrived the ER his completely obtunded. Required BiPAP. He has received IV steroids nebulizer treatment and still on BiPAP. His COVID-19 screen is negative. Patient is being admitted with acute on chronic respiratory failure secondary to COPD exacerbation. Now on 2L O2.   Clinical Impression   Pt seen for OT evaluation this date. Pt was independent in all ADL and functional mobility, living in a 1 story home with 3 steps and R hand rail. Pt reports he shares this home with a roommate who owns the home. Pt reports being on 3L of O2 at home (per chart, pt on 2L at baseline). Pt endorses trying to rush to get his grocery shopping done so he can "get in and get out" before he becomes fatigued and realizes he requires significant time to recover afterwards. Pt reports becoming easily fatigued or out of breath with minimal exertion during ADL and IADL tasks. Pt currently requires supervision assist for ADL and functional mobility due to current functional impairments (See OT Problem List below). Pt required >5 minutes to recover during all tasks with O2 sats dropping to 80% and coming back up to mid 80's to high-80's, briefly even in low 90's, on 2L O2 and with intermittent cues for PLB. RN notified. Pt educated in energy conservation strategies including pursed lip breathing, activity pacing, home/routines modifications, work simplification,  AE/DME, prioritizing of meaningful occupations, and falls prevention. Pt verbalized understanding and would benefit from additional skilled OT services to maximize recall and carryover of learned techniques and facilitate implementation of learned techniques into daily routines. Upon discharge, recommend HHOT services.     Follow Up Recommendations  Home health OT vs Outpatient OT (pending ability to secure transportation)   Equipment Recommendations  None recommended by OT    Recommendations for Other Services       Precautions / Restrictions Precautions Precautions: Other (comment) Precaution Comments: watch O2 sats with exertion Restrictions Weight Bearing Restrictions: No      Mobility Bed Mobility Overal bed mobility: Modified Independent             General bed mobility comments: requires >45mins seated EOB to recover from effort with O2 sats dropping to 80% and improving slowly to 85%-91%. Pt denied SOB throughout  Transfers Overall transfer level: Needs assistance   Transfers: Sit to/from Stand Sit to Stand: Supervision         General transfer comment: no difficulty; Pt denied SOB throughout    Balance Overall balance assessment: No apparent balance deficits (not formally assessed)                                         ADL either performed or assessed with clinical judgement   ADL Overall ADL's : Modified independent  General ADL Comments: Supervision for ADL tasks, intermittent verbal cues for rest breaks and PLB to support breath recovery - pt denies feeling SOB throughout     Vision Baseline Vision/History: (pt reports he needs glasses because his distance vision is getting worse, but does not currently have glasses) Patient Visual Report: No change from baseline Vision Assessment?: No apparent visual deficits     Perception     Praxis      Pertinent Vitals/Pain Pain  Assessment: No/denies pain     Hand Dominance Right   Extremity/Trunk Assessment Upper Extremity Assessment Upper Extremity Assessment: Overall WFL for tasks assessed   Lower Extremity Assessment Lower Extremity Assessment: Overall WFL for tasks assessed   Cervical / Trunk Assessment Cervical / Trunk Assessment: Normal   Communication Communication Communication: No difficulties   Cognition Arousal/Alertness: Awake/alert Behavior During Therapy: WFL for tasks assessed/performed Overall Cognitive Status: Within Functional Limits for tasks assessed                                     General Comments  With bed mobility and ADL mobility from EOB to toilet, O2 sats dropped from low 90's to low 80's on 2L O2 requiring >5 min to recover to mid-80's, requiring more time to recover to 90-91%. HR 109-114 and RR 14-19 with activity.    Exercises Other Exercises Other Exercises: Pt educated in energy conservation strategies including pursed lip breathing (able to return demo but required cues to use), activity pacing, home/routines modifications, AE/DME, work simplification, falls prevention, and strategies for managing groceries/driving/community activities   Shoulder Instructions      Home Living Family/patient expects to be discharged to:: Private residence Living Arrangements: Non-relatives/Friends Available Help at Discharge: Friend(s);Available PRN/intermittently Type of Home: House Home Access: Stairs to enter [employer] of Steps: 3 Entrance Stairs-Rails: Right Home Layout: One level     Bathroom Shower/Tub: Teacher, early years/pre: Handicapped height     Home Equipment: None;Shower seat;Grab bars - tub/shower   Additional Comments: Pt reports roommate has a few canes      Prior Functioning/Environment Level of Independence: Independent        Comments: Indep with mobility, ADL, driving, meal prep, no falls. Pt reports indep  with med mgt (chart indicates non-compliance). Pt reports being on 3L O2 at home (chart indicates 2L O2).        OT Problem List: Cardiopulmonary status limiting activity;Decreased activity tolerance;Decreased knowledge of use of DME or AE      OT Treatment/Interventions: Self-care/ADL training;Therapeutic exercise;Therapeutic activities;Energy conservation;DME and/or AE instruction;Patient/family education    OT Goals(Current goals can be found in the care plan section) Acute Rehab OT Goals Patient Stated Goal: breathe better and go home OT Goal Formulation: With patient Time For Goal Achievement: 05/03/19 Potential to Achieve Goals: Good ADL Goals Pt Will Transfer to Toilet: with modified independence;ambulating;regular height toilet(O2 sats>90% on 2L) Additional ADL Goal #1: Pt will verbalize plan to implement at least 2 learned energy conservation strategies for ADL and IADL Additional ADL Goal #2: Pt will perform UB and LB dressing with modified independence, incorporating rest breaks and PLB to support breath recovery.  OT Frequency: Min 1X/week   Barriers to D/C:            Co-evaluation              AM-PAC OT "6 Clicks" Daily Activity  Outcome Measure Help from another person eating meals?: None Help from another person taking care of personal grooming?: None Help from another person toileting, which includes using toliet, bedpan, or urinal?: A Little Help from another person bathing (including washing, rinsing, drying)?: A Little Help from another person to put on and taking off regular upper body clothing?: None Help from another person to put on and taking off regular lower body clothing?: A Little 6 Click Score: 21   End of Session Equipment Utilized During Treatment: Gait belt Nurse Communication: Mobility status(O2 sats with activity)  Activity Tolerance: Patient tolerated treatment well;Other (comment)(O2 sats drop to low 80's on 2L O2) Patient left:  in bed;with call bell/phone within reach;with bed alarm set  OT Visit Diagnosis: Other abnormalities of gait and mobility (R26.89)                Time: 6270-3500 OT Time Calculation (min): 45 min Charges:  OT General Charges $OT Visit: 1 Visit OT Evaluation $OT Eval Low Complexity: 1 Low OT Treatments $Self Care/Home Management : 23-37 mins  Richrd Prime, MPH, MS, OTR/L ascom [phone] 04/19/19, 1:20 PM

## 2019-04-19 NOTE — Progress Notes (Signed)
1845 Pt oriented to room. Call light within reach. Tele monitor on. NSR/ST. Pt encouraged to call for needs.

## 2019-04-19 NOTE — Evaluation (Signed)
Physical Therapy Evaluation Patient Details Name: Chris Case MRN: [id number] DOB: 1970/09/28 Today's Date: 04/19/2019   History of Present Illness  Pt is a 49 y.o. male with medical history significant of COPD, medication noncompliance, who was brought in by EMS unresponsive from home. Patient apparently was confused and combative at home. He was supposed to have CPAP but patient was refusing that. EMS was able to bring him in on nonrebreather bag. His initial oxygen saturation at home was around 50% by EMS. On the nonrebreather back he was up to 100%.  When he arrived the ER his completely obtunded. Required BiPAP. He has received IV steroids nebulizer treatment and still on BiPAP. His COVID-19 screen is negative. Patient is being admitted with acute on chronic respiratory failure secondary to COPD exacerbation.  Clinical Impression  Prior to hospital admission, pt was independent with ambulation; on 3 L home O2; and lives with a room-mate in 1 level home with 3 STE with R railing.  Currently pt is modified independent semi-supine to sitting edge of bed; SBA with transfers; and CGA to SBA ambulating up to 6 feet (no AD).  Pt's O2 sats (on 3 L O2 via nasal cannula entire session) 95% at rest; 88-89% sitting edge of bed; and 85-86% with short ambulation trials.  Educated pt on pursed lip breathing techniques to improve nasal cannula oxygen usage.  O2 sats 92% end of session at rest (on 3 L).  HR 101 bpm at rest and increased up to 121 bpm with activity.  Pt would benefit from skilled PT to address noted impairments and functional limitations (see below for any additional details).  Upon hospital discharge, no PT follow up anticipated but pt would benefit from an OP pulmonary rehab program.    Follow Up Recommendations No PT follow up(may benefit from OP pulmonary rehab program)    Equipment Recommendations  None recommended by PT    Recommendations for Other Services       Precautions /  Restrictions Precautions Precautions: Other (comment) Precaution Comments: watch O2 sats with exertion Restrictions Weight Bearing Restrictions: No      Mobility  Bed Mobility Overal bed mobility: Modified Independent             General bed mobility comments: No difficulties noted semi-supine to sitting edge of bed  Transfers Overall transfer level: Needs assistance   Transfers: Sit to/from Stand Sit to Stand: Supervision         General transfer comment: strong steady stand from bed x2 trials and from toilet; controlled descent noted sitting  Ambulation/Gait Ambulation/Gait assistance: Min guard;Supervision Gait Distance (Feet): (forward/backward 3 feet x2 reps; 6 feet along bed; 6 feet x2 to toilet and back to bed) Assistive device: None   Gait velocity: mildly decreased d/t lines   General Gait Details: steady partial to step through gait pattern  Stairs            Wheelchair Mobility    Modified Rankin (Stroke Patients Only)       Balance Overall balance assessment: Needs assistance Sitting-balance support: No upper extremity supported;Feet supported Sitting balance-Leahy Scale: Normal Sitting balance - Comments: steady sitting reaching outside BOS   Standing balance support: No upper extremity supported;During functional activity Standing balance-Leahy Scale: Good Standing balance comment: steady with ambulation                             Pertinent Vitals/Pain Pain Assessment: No/denies pain  Home Living Family/patient expects to be discharged to:: Private residence Living Arrangements: Non-relatives/Friends(Room-mate) Available Help at Discharge: Friend(s);Available PRN/intermittently Type of Home: House Home Access: Stairs to enter Entrance Stairs-Rails: Right Entrance Stairs-Number of Steps: 3 Home Layout: One level Home Equipment: Shower seat;Grab bars - tub/shower Additional Comments: Pt reports roommate has a few  canes    Prior Function Level of Independence: Independent         Comments: Pt reports no falls in past 6 months (per chart review pt fell day prior to hospitalization).  3 L home O2 per pt (chart indicates 2 L O2).     Hand Dominance   Dominant Hand: Right    Extremity/Trunk Assessment   Upper Extremity Assessment Upper Extremity Assessment: Generalized weakness    Lower Extremity Assessment Lower Extremity Assessment: Generalized weakness    Cervical / Trunk Assessment Cervical / Trunk Assessment: Normal  Communication   Communication: No difficulties  Cognition Arousal/Alertness: Awake/alert Behavior During Therapy: WFL for tasks assessed/performed Overall Cognitive Status: Within Functional Limits for tasks assessed                                        General Comments General comments (skin integrity, edema, etc.): Pt on 3 L O2 via nasal cannula upon PT entry.    Exercises    Assessment/Plan    PT Assessment Patient needs continued PT services  PT Problem List Decreased strength;Decreased activity tolerance;Decreased mobility;Cardiopulmonary status limiting activity       PT Treatment Interventions DME instruction;Gait training;Functional mobility training;Stair training;Therapeutic activities;Therapeutic exercise;Balance training;Patient/family education    PT Goals (Current goals can be found in the Care Plan section)  Acute Rehab PT Goals Patient Stated Goal: breathe better and go home PT Goal Formulation: With patient Time For Goal Achievement: 05/03/19 Potential to Achieve Goals: Good    Frequency Min 2X/week   Barriers to discharge        Co-evaluation               AM-PAC PT "6 Clicks" Mobility  Outcome Measure Help needed turning from your back to your side while in a flat bed without using bedrails?: None Help needed moving from lying on your back to sitting on the side of a flat bed without using bedrails?:  None Help needed moving to and from a bed to a chair (including a wheelchair)?: None Help needed standing up from a chair using your arms (e.g., wheelchair or bedside chair)?: None Help needed to walk in hospital room?: A Little Help needed climbing 3-5 steps with a railing? : A Little 6 Click Score: 22    End of Session Equipment Utilized During Treatment: Gait belt;Oxygen(3 L O2 via nasal cannula) Activity Tolerance: Patient tolerated treatment well Patient left: with call bell/phone within reach;with bed alarm set(sitting on edge of bed) Nurse Communication: Mobility status;Precautions(pt sitting edge of bed) PT Visit Diagnosis: Other abnormalities of gait and mobility (R26.89);Muscle weakness (generalized) (M62.81);History of falling (Z91.81)    Time: 1403-1430 PT Time Calculation (min) (ACUTE ONLY): 27 min   Charges:   PT Evaluation $PT Eval Low Complexity: 1 Low         Trafton Roker, PT 04/19/19, 3:06 PM

## 2019-04-19 NOTE — Progress Notes (Signed)
CH visited pt. while rounding on ICU; pt in bed watching TV; shared he came to ED suffering from shortness of breath related to COPD and other respiratory issues; pt. shared he lives w/a roommate who is currently taking care of his dog; has family in [place] but does not seem to be in close contact.  Pt. requested prayer but shared he does not belong to a particular faith tradition; CH offered prayer for pt.'s health and for his family.  No further needs expressed at this time.        04/19/19 1300  Clinical Encounter Type  Visited With Patient  Visit Type Initial;Psychological support;Spiritual support;Social support;Critical Care  Referral From Other (Comment) (Rounding)  Spiritual Encounters  Spiritual Needs Ritual;Emotional  Stress Factors  Patient Stress Factors Health changes;Loss of control;Major life changes  Family Stress Factors Not reviewed

## 2019-04-19 NOTE — Progress Notes (Signed)
PROGRESS NOTE    Chris Case  IEP:[id number] DOB: 12/14/1970 DOA: 04/16/2019 PCP: [facility]   Brief Narrative:  HPI: Chris Case is a 49 y.o. male with medical history significant of COPD, medication noncompliance, who was brought in by EMS unresponsive from home. Patient apparently was confused and combative at home. He was supposed to have CPAP but patient was refusing that. EMS was able to bring him in on nonrebreather bag. His initial oxygen saturation at home was around 50% by EMS. On the nonrebreather back he was up to 100%. He was lethargic still at my exam. When he arrived the ER his completely obtunded. Currently on BiPAP. He has received IV steroids nebulizer treatment and still on BiPAP. His COVID-19 screen is negative. Patient is being admitted with acute on chronic respiratory failure secondary to COPD exacerbation.  2/13: Patient seen and examined.  Remains on BiPAP in the emergency department.  Mentating much better now.  Alert and oriented x4.  Instructed the ED nurse to have respiratory attempts to titrate off noninvasive positive pressure ventilation.  2/14: Patient seen and examined.  Became agitated last night.  Try to take BiPAP mask of.  Encephalopathic.  Started on Precedex infusion and placed back on BiPAP.  Mentating much better this morning.  Intensivist consulted for respiratory failure and encephalopathy requiring Precedex gtt.  2/15: Patient seen and examined.  Much calmer this morning.  On baseline 2 to 3 L nasal cannula.  Off Precedex infusion.  Stable for transfer to MedSurg bed.   Assessment & Plan:   Principal Problem:   Acute on chronic respiratory failure with hypoxia and hypercapnia (HCC) Active Problems:   COPD exacerbation (HCC)   Thrombocytopenia (HCC)  Acute exacerbation of COPD Acute on chronic hypercarbic and hypoxic respiratory failure Acute metabolic encephalopathy Agitation noted overnight Started on Precedex  infusion Intensivist consulted, discussed case with Dr. Karna Christmas Plan: Transfer to MedSurg P.o. prednisone 50 mg daily IV Levaquin 750 mg daily, plan for 5-day course DuoNeb every 4 hours Brovana twice daily Pulmicort twice daily Incentive spirometry/flutter valve use  Thrombocytopenia Appears chronic No indication of bleeding Monitor while on chemoprophylaxis   DVT prophylaxis: Lovenox Code Status: Full Family Communication: None today Disposition Plan: Anticipate home, 48 to 72 hours  Consultants:   None  Procedures:   NIPPV  Antimicrobials:   Levaquin (04/16/19-  )   Subjective: Seen and examined Mentation improved Back on baseline 3 L nasal cannula Stable for transfer to MedSurg bed  Objective: Vitals:   04/19/19 1300 04/19/19 1400 04/19/19 1441 04/19/19 1500  BP: 114/79 102/74  122/77  Pulse: (!) 104 97  (!) 110  Resp: 20 17  17   Temp:      TempSrc:      SpO2: 99% 90% 96% 92%  Weight:      Height:        Intake/Output Summary (Last 24 hours) at 04/19/2019 1707 Last data filed at 04/19/2019 1100 Gross per 24 hour  Intake 617 ml  Output 1300 ml  Net -683 ml   Filed Weights   04/16/19 1830 04/17/19 1252  Weight: 61.9 kg 55.1 kg    Examination:  General exam: Appears calm and comfortable  Respiratory system: Diffusely decreased lung sounds bilaterally.  No obvious wheeze.  Normal work of breathing Cardiovascular system: S1, S2 heard, RRR, no murmurs, no pedal edema  gastrointestinal system: Abdomen is nondistended, soft and nontender. No organomegaly or masses felt. Normal bowel sounds heard. Central nervous  system: Alert and oriented. No focal neurological deficits. Extremities: Symmetric 5 x 5 power. Skin: No rashes, lesions or ulcers Psychiatry: Judgement and insight appear normal. Mood & affect appropriate.     Data Reviewed: I have personally reviewed following labs and imaging studies  CBC: Recent Labs  Lab 04/16/19 1842  04/17/19 0127 04/17/19 0458 04/18/19 0507 04/19/19 0421  WBC 7.4 6.3 5.2 4.1 7.1  NEUTROABS 5.7  --   --  3.5 5.0  HGB 12.2* 11.1* 10.8* 9.9* 10.0*  HCT 41.7 38.2* 35.8* 32.9* 32.7*  MCV 106.9* 106.1* 104.1* 101.2* 101.6*  PLT 110* 106* 104* 89* 97*   Basic Metabolic Panel: Recent Labs  Lab 04/16/19 1842 04/17/19 0127 04/17/19 0458 04/18/19 0507 04/19/19 0421  NA 141  --  140 138 137  K 4.4  --  4.6 4.3 3.7  CL 81*  --  83* 86* 84*  CO2 >50*  --  48* 44* 46*  GLUCOSE 157*  --  144* 153* 93  BUN 23*  --  22* 16 20  CREATININE 0.46* 0.77 0.61 0.49* 0.68  CALCIUM 9.2  --  8.9 9.2 8.7*   GFR: Estimated Creatinine Clearance: 87.1 mL/min (by C-G formula based on SCr of 0.68 mg/dL). Liver Function Tests: Recent Labs  Lab 04/16/19 1842 04/17/19 0458  AST 26 25  ALT 13 14  ALKPHOS 48 44  BILITOT 1.1 1.1  PROT 7.7 7.3  ALBUMIN 4.3 3.9   No results for input(s): LIPASE, AMYLASE in the last 168 hours. Recent Labs  Lab 04/16/19 1842  AMMONIA 46*   Coagulation Profile: Recent Labs  Lab 04/16/19 1842  INR 1.0   Cardiac Enzymes: No results for input(s): CKTOTAL, CKMB, CKMBINDEX, TROPONINI in the last 168 hours. BNP (last 3 results) No results for input(s): PROBNP in the last 8760 hours. HbA1C: No results for input(s): HGBA1C in the last 72 hours. CBG: Recent Labs  Lab 04/16/19 1845 04/17/19 1252  GLUCAP 130* 145*   Lipid Profile: No results for input(s): CHOL, HDL, LDLCALC, TRIG, CHOLHDL, LDLDIRECT in the last 72 hours. Thyroid Function Tests: No results for input(s): TSH, T4TOTAL, FREET4, T3FREE, THYROIDAB in the last 72 hours. Anemia Panel: No results for input(s): VITAMINB12, FOLATE, FERRITIN, TIBC, IRON, RETICCTPCT in the last 72 hours. Sepsis Labs: Recent Labs  Lab 04/16/19 1842 04/16/19 2040  PROCALCITON <0.10  --   LATICACIDVEN 0.7 0.8    Recent Results (from the past 240 hour(s))  Blood Culture (routine x 2)     Status: None (Preliminary  result)   Collection Time: 04/16/19  6:42 PM   Specimen: BLOOD  Result Value Ref Range Status   Specimen Description BLOOD RIGHT ANTECUBITAL  Final   Special Requests   Final    BOTTLES DRAWN AEROBIC AND ANAEROBIC Blood Culture results may not be optimal due to an excessive volume of blood received in culture bottles   Culture   Final    NO GROWTH 3 DAYS Performed at [facility], [address] [place], [place] [address]    Report Status PENDING  Incomplete  Blood Culture (routine x 2)     Status: None (Preliminary result)   Collection Time: 04/16/19  6:42 PM   Specimen: BLOOD  Result Value Ref Range Status   Specimen Description BLOOD BLOOD LEFT HAND  Final   Special Requests   Final    BOTTLES DRAWN AEROBIC AND ANAEROBIC Blood Culture results may not be optimal due to an excessive volume of  blood received in culture bottles   Culture   Final    NO GROWTH 3 DAYS Performed at [facility], [address] [place], [place] [address]    Report Status PENDING  Incomplete  Respiratory Panel by RT PCR (Flu A&B, Covid) - Nasopharyngeal Swab     Status: None   Collection Time: 04/16/19  7:37 PM   Specimen: Nasopharyngeal Swab  Result Value Ref Range Status   SARS Coronavirus 2 by RT PCR NEGATIVE NEGATIVE Final    Comment: (NOTE) SARS-CoV-2 target nucleic acids are NOT DETECTED. The SARS-CoV-2 RNA is generally detectable in upper respiratoy specimens during the acute phase of infection. The lowest concentration of SARS-CoV-2 viral copies this assay can detect is 131 copies/mL. A negative result does not preclude SARS-Cov-2 infection and should not be used as the sole basis for treatment or other patient management decisions. A negative result may occur with  improper specimen collection/handling, submission of specimen other than nasopharyngeal swab, presence of viral mutation(s) within the areas targeted by this assay, and inadequate number of viral copies (<131  copies/mL). A negative result must be combined with clinical observations, patient history, and epidemiological information. The expected result is Negative. Fact Sheet for Patients:  PinkCheek.be Fact Sheet for Healthcare Providers:  GravelBags.it This test is not yet ap proved or cleared by the [place] FDA and  has been authorized for detection and/or diagnosis of SARS-CoV-2 by FDA under an Emergency Use Authorization (EUA). This EUA will remain  in effect (meaning this test can be used) for the duration of the COVID-19 declaration under Section 564(b)(1) of the Act, 21 U.S.C. section 360bbb-3(b)(1), unless the authorization is terminated or revoked sooner.    Influenza A by PCR NEGATIVE NEGATIVE Final   Influenza B by PCR NEGATIVE NEGATIVE Final    Comment: (NOTE) The Xpert Xpress SARS-CoV-2/FLU/RSV assay is intended as an aid in  the diagnosis of influenza from Nasopharyngeal swab specimens and  should not be used as a sole basis for treatment. Nasal washings and  aspirates are unacceptable for Xpert Xpress SARS-CoV-2/FLU/RSV  testing. Fact Sheet for Patients: PinkCheek.be Fact Sheet for Healthcare Providers: GravelBags.it This test is not yet approved or cleared by the [place] FDA and  has been authorized for detection and/or diagnosis of SARS-CoV-2 by  FDA under an Emergency Use Authorization (EUA). This EUA will remain  in effect (meaning this test can be used) for the duration of the  Covid-19 declaration under Section 564(b)(1) of the Act, 21  U.S.C. section 360bbb-3(b)(1), unless the authorization is  terminated or revoked. Performed at [facility], [address], [place], [place] [address]   Urine culture     Status: None   Collection Time: 04/16/19  8:51 PM   Specimen: In/Out Cath Urine  Result Value Ref Range Status   Specimen  Description   Final    IN/OUT CATH URINE Performed at [facility], [address], [place], [place] [address]    Special Requests   Final    NONE Performed at [facility], [address] [place], [place] [address]    Culture   Final    NO GROWTH Performed at [facility], [place] [address] [place], [place] [address]    Report Status 04/18/2019 FINAL  Final  MRSA PCR Screening     Status: None   Collection Time: 04/17/19 12:56 PM   Specimen: Nasopharyngeal  Result Value Ref Range Status  MRSA by PCR NEGATIVE NEGATIVE Final    Comment:        The GeneXpert MRSA Assay (FDA approved for NASAL specimens only), is one component of a comprehensive MRSA colonization surveillance program. It is not intended to diagnose MRSA infection nor to guide or monitor treatment for MRSA infections. Performed at [facility], [address], [place], [place] [address]          Radiology Studies: [facility] Chest [facility] 1 View  Result Date: 04/18/2019 CLINICAL DATA:  Pulmonary disease. Acute respiratory failure. Encephalopathy. EXAM: PORTABLE CHEST 1 VIEW COMPARISON:  Chest x-rays dated 04/16/2019 and 01/24/2019. FINDINGS: Lungs are hyperexpanded. Chronic scarring/atelectasis at the RIGHT lung base. No new opacity to suggest a developing pneumonia or pulmonary edema. No pleural effusion or pneumothorax is seen. Borderline cardiomegaly, stable. Osseous structures about the chest are unremarkable. IMPRESSION: 1. No acute findings. No evidence of pneumonia or pulmonary edema. 2. Hyperexpanded lungs indicating COPD. Electronically Signed   By: Bary Richard M.D.   On: 04/18/2019 15:04        Scheduled Meds: . arformoterol  15 mcg Nebulization BID  . budesonide (PULMICORT) nebulizer solution  0.25 mg Nebulization BID  . chlorhexidine  15 mL Mouth Rinse BID  . Chlorhexidine Gluconate Cloth  6 each Topical Daily  . enoxaparin (LOVENOX) injection  40 mg Subcutaneous  Q24H  . feeding supplement (ENSURE ENLIVE)  237 mL Oral BID BM  . ipratropium-albuterol  3 mL Nebulization Q6H  . mouth rinse  15 mL Mouth Rinse q12n4p  . predniSONE  50 mg Oral Q breakfast   Continuous Infusions: . levofloxacin (LEVAQUIN) IV 750 mg (04/19/19 1703)     LOS: 3 days    Time spent: 35 minutes    Tresa Moore, MD [facility] Pager [phone]  If 7PM-7AM, please contact night-coverage 04/19/2019, 5:07 PM

## 2019-04-20 LAB — CBC WITH DIFFERENTIAL/PLATELET
Abs Immature Granulocytes: 0.02 10*3/uL (ref 0.00–0.07)
Basophils Absolute: 0 10*3/uL (ref 0.0–0.1)
Basophils Relative: 0 %
Eosinophils Absolute: 0 10*3/uL (ref 0.0–0.5)
Eosinophils Relative: 1 %
HCT: 33.9 % — ABNORMAL LOW (ref 39.0–52.0)
Hemoglobin: 10.4 g/dL — ABNORMAL LOW (ref 13.0–17.0)
Immature Granulocytes: 0 %
Lymphocytes Relative: 26 %
Lymphs Abs: 1.7 10*3/uL (ref 0.7–4.0)
MCH: 31.2 pg (ref 26.0–34.0)
MCHC: 30.7 g/dL (ref 30.0–36.0)
MCV: 101.8 fL — ABNORMAL HIGH (ref 80.0–100.0)
Monocytes Absolute: 0.8 10*3/uL (ref 0.1–1.0)
Monocytes Relative: 12 %
Neutro Abs: 4 10*3/uL (ref 1.7–7.7)
Neutrophils Relative %: 61 %
Platelets: 90 10*3/uL — ABNORMAL LOW (ref 150–400)
RBC: 3.33 MIL/uL — ABNORMAL LOW (ref 4.22–5.81)
RDW: 12.5 % (ref 11.5–15.5)
WBC: 6.6 10*3/uL (ref 4.0–10.5)
nRBC: 0 % (ref 0.0–0.2)

## 2019-04-20 LAB — BASIC METABOLIC PANEL
Anion gap: 8 (ref 5–15)
BUN: 15 mg/dL (ref 6–20)
CO2: 45 mmol/L — ABNORMAL HIGH (ref 22–32)
Calcium: 8.9 mg/dL (ref 8.9–10.3)
Chloride: 85 mmol/L — ABNORMAL LOW (ref 98–111)
Creatinine, Ser: 0.6 mg/dL — ABNORMAL LOW (ref 0.61–1.24)
GFR calc Af Amer: >60 mL/min (ref >60)
GFR calc non Af Amer: >60 mL/min (ref >60)
Glucose, Bld: 80 mg/dL (ref 70–99)
Potassium: 3.5 mmol/L (ref 3.5–5.1)
Sodium: 138 mmol/L (ref 135–145)

## 2019-04-20 MED ORDER — PREDNISONE 20 MG PO TABS: 20.0000 mg | ORAL_TABLET | Freq: Every day | ORAL | 0 refills | Status: AC

## 2019-04-20 MED ORDER — LEVOFLOXACIN 750 MG PO TABS: 750.0000 mg | ORAL_TABLET | Freq: Every day | ORAL | 0 refills | Status: AC

## 2019-04-20 MED ORDER — LEVOFLOXACIN 750 MG PO TABS: 750.0000 mg | ORAL_TABLET | Freq: Every day | ORAL | 0 refills | Status: DC

## 2019-04-20 MED ORDER — PREDNISONE 20 MG PO TABS: 20.0000 mg | ORAL_TABLET | Freq: Every day | ORAL | 0 refills | Status: DC

## 2019-04-20 NOTE — Progress Notes (Signed)
Patient alert and oriented, vss, no complaints of pain.  D/c home with oxygen.  D/c telemetry and PIV.  Escorted out of hospital via wheelchair by nursing staff.

## 2019-04-20 NOTE — Discharge Summary (Signed)
Physician Discharge Summary  Chris Case ZOX:[id number] DOB: [birth year] DOA: 04/16/2019  [facility]  Admit date: 04/16/2019 Discharge date: 04/20/2019  Admitted From: Home Disposition: Home  Recommendations for Outpatient Follow-up:  1. Follow up with PCP in 1-2 weeks   Home Health: No Equipment/Devices: Oxygen 2 L Discharge Condition: Stable CODE STATUS: Full Diet recommendation: Heart Healthy  Brief/Interim Summary:  WJX:BJYNWGHPI:Chris Case a 48 y.o.malewith medical history significant ofCOPD, medication noncompliance, who was brought in by EMS unresponsive from home. Patient apparently was confused and combative at home. He was supposed to have CPAP but patient was refusing that. EMS was able to bring him in on nonrebreather bag. His initial oxygen saturation at home was around 50% by EMS. On the nonrebreather back he was up to 100%. He was lethargic still at my exam. When he arrived the ER his completely obtunded. Currently on BiPAP. He has received IV steroids nebulizer treatment and still on BiPAP. His COVID-19 screen is negative. Patient is being admitted with acute on chronic respiratory failure secondary to COPD exacerbation.  2/13: Patient seen and examined.  Remains on BiPAP in the emergency department.  Mentating much better now.  Alert and oriented x4.  Instructed the ED nurse to have respiratory attempts to titrate off noninvasive positive pressure ventilation.  2/14: Patient seen and examined.  Became agitated last night.  Try to take BiPAP mask of.  Encephalopathic.  Started on Precedex infusion and placed back on BiPAP.  Mentating much better this morning.  Intensivist consulted for respiratory failure and encephalopathy requiring Precedex gtt.  2/15: Patient seen and examined.  Much calmer this morning.  On baseline 2 to 3 L nasal cannula.  Off Precedex infusion.  Stable for transfer to MedSurg bed.  2/16: Patient seen and examined this  morning.  Baseline level of mental status.  Baseline 2 to 3 L nasal cannula.  Medically stable for discharge home at this time.  Seen by physical therapy.  Ambulated without pain or desaturation.  No outpatient PT needs  Discharge Diagnoses:  Principal Problem:   Acute on chronic respiratory failure with hypoxia and hypercapnia (HCC) Active Problems:   COPD exacerbation (HCC)   Thrombocytopenia (HCC)  Acute exacerbation of COPD Acute on chronic hypercarbic and hypoxic respiratory failure Acute metabolic encephalopathy Admitted initially to stepdown unit Required BiPAP and Precedex infusion for acute on chronic respiratory failure with associated altered mentation Weaned off to nasal cannula Mental status gradually returned to baseline IV steroids, taper to p.o. IV Levaquin, complete 5-day course Bronchodilator therapy Stable at time of discharge,respiratory status at baseline Discharge home with outpatient PCP follow-up   Thrombocytopenia Appears chronic No indication of bleeding Monitor while on chemoprophylaxis  Discharge Instructions  Discharge Instructions    Diet - low sodium heart healthy   Complete by: As directed    Increase activity slowly   Complete by: As directed      Allergies as of 04/20/2019   No Known Allergies     Medication List    TAKE these medications   albuterol (2.5 MG/3ML) 0.083% nebulizer solution Commonly known as: PROVENTIL Inhale 3 mLs into the lungs every 4 (four) hours as needed for wheezing or shortness of breath.   albuterol 108 (90 Base) MCG/ACT inhaler Commonly known as: VENTOLIN HFA Inhale 2 puffs into the lungs every 4 (four) hours as needed for shortness of breath.   levofloxacin 750 MG tablet Commonly known as: Levaquin Take 1 tablet (750 mg total) by  mouth daily for 2 days.   mometasone-formoterol 200-5 MCG/ACT Aero Commonly known as: DULERA Inhale 2 puffs into the lungs 2 (two) times daily.   predniSONE 20 MG  tablet Commonly known as: Deltasone Take 1 tablet (20 mg total) by mouth daily for 3 days.   Spiriva Respimat 1.25 MCG/ACT Aers Generic drug: Tiotropium Bromide Monohydrate Inhale 1 puff into the lungs daily.      Follow-up Information    [facility]. Go on 04/27/2019.   Specialty: General Practice Why: appointment at 1pm Contact information: [address] [place] [address] [phone]          No Known Allergies  Consultations:  ICU   Procedures/Studies: CT Head Wo Contrast  Result Date: 04/16/2019 CLINICAL DATA:  Larey Seat yesterday, altered level of consciousness, combative EXAM: CT HEAD WITHOUT CONTRAST TECHNIQUE: Contiguous axial images were obtained from the base of the skull through the vertex without intravenous contrast. COMPARISON:  01/25/2019 FINDINGS: Brain: No acute infarct or hemorrhage. Lateral ventricles and midline structures are stable. No acute extra-axial fluid collections. No mass effect. Low lying cerebellar tonsils again noted. Vascular: No hyperdense vessel or unexpected calcification. Skull: Normal. Negative for fracture or focal lesion. Sinuses/Orbits: No acute finding. Other: None IMPRESSION: 1. Stable head CT, no acute intracranial process. 2. Stable Chiari malformation. Electronically Signed   By: Sharlet Salina M.D.   On: 04/16/2019 19:59   DG Chest Port 1 View  Result Date: 04/18/2019 CLINICAL DATA:  Pulmonary disease. Acute respiratory failure. Encephalopathy. EXAM: PORTABLE CHEST 1 VIEW COMPARISON:  Chest x-rays dated 04/16/2019 and 01/24/2019. FINDINGS: Lungs are hyperexpanded. Chronic scarring/atelectasis at the RIGHT lung base. No new opacity to suggest a developing pneumonia or pulmonary edema. No pleural effusion or pneumothorax is seen. Borderline cardiomegaly, stable. Osseous structures about the chest are unremarkable. IMPRESSION: 1. No acute findings. No evidence of pneumonia or pulmonary edema. 2. Hyperexpanded  lungs indicating COPD. Electronically Signed   By: Bary Richard M.D.   On: 04/18/2019 15:04   DG Chest Portable 1 View  Result Date: 04/16/2019 CLINICAL DATA:  Respiratory distress EXAM: PORTABLE CHEST 1 VIEW COMPARISON:  01/24/2019 FINDINGS: 2 frontal views of the chest demonstrate a stable cardiac silhouette. Extensive emphysema is again noted. Prominent vascular shadows at the right lung base, stable since 2015. No airspace disease, effusion, or pneumothorax. No acute bony abnormalities. IMPRESSION: 1. Severe emphysema, no superimposed airspace disease. Electronically Signed   By: Sharlet Salina M.D.   On: 04/16/2019 18:52    (Echo, Carotid, EGD, Colonoscopy, ERCP)    Subjective: Seen and examined on the day of discharge Calm, mentating clearly On 2 L nasal cannula, baseline Medically stable for discharge home  Discharge Exam: Vitals:   04/20/19 0752 04/20/19 1124  BP: 108/73 108/83  Pulse: 71 (!) 104  Resp: 18 18  Temp: 98.4 F (36.9 C) 97.9 F (36.6 C)  SpO2: 96% 97%   Vitals:   04/20/19 0237 04/20/19 0447 04/20/19 0752 04/20/19 1124  BP: 103/74  108/73 108/83  Pulse: 72  71 (!) 104  Resp: 18  18 18   Temp: 97.6 F (36.4 C)  98.4 F (36.9 C) 97.9 F (36.6 C)  TempSrc: Oral  Axillary Oral  SpO2: 100%  96% 97%  Weight:  56.6 kg    Height:        General: Pt is alert, awake, not in acute distress Cardiovascular: RRR, S1/S2 +, no rubs, no gallops Respiratory: Diffusely decreased lung sounds, normal work  of breathing, no wheezing Abdominal: Soft, NT, ND, bowel sounds + Extremities: no edema, no cyanosis    The results of significant diagnostics from this hospitalization (including imaging, microbiology, ancillary and laboratory) are listed below for reference.     Microbiology: Recent Results (from the past 240 hour(s))  Blood Culture (routine x 2)     Status: None (Preliminary result)   Collection Time: 04/16/19  6:42 PM   Specimen: BLOOD  Result Value Ref  Range Status   Specimen Description BLOOD RIGHT ANTECUBITAL  Final   Special Requests   Final    BOTTLES DRAWN AEROBIC AND ANAEROBIC Blood Culture results may not be optimal due to an excessive volume of blood received in culture bottles   Culture   Final    NO GROWTH 4 DAYS Performed at [facility], [address], [place], [place] [address]    Report Status PENDING  Incomplete  Blood Culture (routine x 2)     Status: None (Preliminary result)   Collection Time: 04/16/19  6:42 PM   Specimen: BLOOD  Result Value Ref Range Status   Specimen Description BLOOD BLOOD LEFT HAND  Final   Special Requests   Final    BOTTLES DRAWN AEROBIC AND ANAEROBIC Blood Culture results may not be optimal due to an excessive volume of blood received in culture bottles   Culture   Final    NO GROWTH 4 DAYS Performed at [facility], [address] [place], [place] [address]    Report Status PENDING  Incomplete  Respiratory Panel by RT PCR (Flu A&B, Covid) - Nasopharyngeal Swab     Status: None   Collection Time: 04/16/19  7:37 PM   Specimen: Nasopharyngeal Swab  Result Value Ref Range Status   SARS Coronavirus 2 by RT PCR NEGATIVE NEGATIVE Final    Comment: (NOTE) SARS-CoV-2 target nucleic acids are NOT DETECTED. The SARS-CoV-2 RNA is generally detectable in upper respiratoy specimens during the acute phase of infection. The lowest concentration of SARS-CoV-2 viral copies this assay can detect is 131 copies/mL. A negative result does not preclude SARS-Cov-2 infection and should not be used as the sole basis for treatment or other patient management decisions. A negative result may occur with  improper specimen collection/handling, submission of specimen other than nasopharyngeal swab, presence of viral mutation(s) within the areas targeted by this assay, and inadequate number of viral copies (<131 copies/mL). A negative result must be combined with clinical observations, patient  history, and epidemiological information. The expected result is Negative. Fact Sheet for Patients:  https://www.moore.com/ Fact Sheet for Healthcare Providers:  https://www.young.biz/ This test is not yet ap proved or cleared by the [place] FDA and  has been authorized for detection and/or diagnosis of SARS-CoV-2 by FDA under an Emergency Use Authorization (EUA). This EUA will remain  in effect (meaning this test can be used) for the duration of the COVID-19 declaration under Section 564(b)(1) of the Act, 21 U.S.C. section 360bbb-3(b)(1), unless the authorization is terminated or revoked sooner.    Influenza A by PCR NEGATIVE NEGATIVE Final   Influenza B by PCR NEGATIVE NEGATIVE Final    Comment: (NOTE) The Xpert Xpress SARS-CoV-2/FLU/RSV assay is intended as an aid in  the diagnosis of influenza from Nasopharyngeal swab specimens and  should not be used as a sole basis for treatment. Nasal washings and  aspirates are unacceptable for Xpert Xpress SARS-CoV-2/FLU/RSV  testing. Fact Sheet for Patients: https://www.moore.com/ Fact Sheet for Healthcare Providers: https://www.young.biz/  This test is not yet approved or cleared by the [place] and  has been authorized for detection and/or diagnosis of SARS-CoV-2 by  FDA under an Emergency Use Authorization (EUA). This EUA will remain  in effect (meaning this test can be used) for the duration of the  Covid-19 declaration under Section 564(b)(1) of the Act, 21  U.S.C. section 360bbb-3(b)(1), unless the authorization is  terminated or revoked. Performed at [facility], [address], [place], [place] [address]   Urine culture     Status: None   Collection Time: 04/16/19  8:51 PM   Specimen: In/Out Cath Urine  Result Value Ref Range Status   Specimen Description   Final    IN/OUT CATH URINE Performed at [facility], [address], [place], [place] [address]    Special Requests   Final    NONE Performed at [facility], [address], [place], [place] [address]    Culture   Final    NO GROWTH Performed at [facility], [address] [place], [place] [address]    Report Status 04/18/2019 FINAL  Final  MRSA PCR Screening     Status: None   Collection Time: 04/17/19 12:56 PM   Specimen: Nasopharyngeal  Result Value Ref Range Status   MRSA by PCR NEGATIVE NEGATIVE Final    Comment:        The GeneXpert MRSA Assay (FDA approved for NASAL specimens only), is one component of a comprehensive MRSA colonization surveillance program. It is not intended to diagnose MRSA infection nor to guide or monitor treatment for MRSA infections. Performed at [facility], [address] [place], [place] [address]      Labs: BNP (last 3 results) Recent Labs    04/16/19 1842  BNP 86.0   Basic Metabolic Panel: Recent Labs  Lab 04/16/19 1842 04/16/19 1842 04/17/19 0127 04/17/19 0458 04/18/19 0507 04/19/19 0421 04/20/19 0651  NA 141  --   --  140 138 137 138  K 4.4  --   --  4.6 4.3 3.7 3.5  CL 81*  --   --  83* 86* 84* 85*  CO2 >50*  --   --  48* 44* 46* 45*  GLUCOSE 157*  --   --  144* 153* 93 80  BUN 23*  --   --  22* 16 20 15   CREATININE 0.46*   < > 0.77 0.61 0.49* 0.68 0.60*  CALCIUM 9.2  --   --  8.9 9.2 8.7* 8.9   < > = values in this interval not displayed.   Liver Function Tests: Recent Labs  Lab 04/16/19 1842 04/17/19 0458  AST 26 25  ALT 13 14  ALKPHOS 48 44  BILITOT 1.1 1.1  PROT 7.7 7.3  ALBUMIN 4.3 3.9   No results for input(s): LIPASE, AMYLASE in the last 168 hours. Recent Labs  Lab 04/16/19 1842  AMMONIA 46*   CBC: Recent Labs  Lab 04/16/19 1842 04/16/19 1842 04/17/19 0127 04/17/19 0458 04/18/19 0507 04/19/19 0421 04/20/19 0651  WBC 7.4   < > 6.3 5.2 4.1 7.1 6.6  NEUTROABS 5.7  --   --   --  3.5 5.0 4.0  HGB 12.2*   < > 11.1*  10.8* 9.9* 10.0* 10.4*  HCT 41.7   < > 38.2* 35.8* 32.9* 32.7* 33.9*  MCV 106.9*   < > 106.1* 104.1* 101.2* 101.6* 101.8*  PLT 110*   < >  106* 104* 89* 97* 90*   < > = values in this interval not displayed.   Cardiac Enzymes: No results for input(s): CKTOTAL, CKMB, CKMBINDEX, TROPONINI in the last 168 hours. BNP: Invalid input(s): POCBNP CBG: Recent Labs  Lab 04/16/19 1845 04/17/19 1252  GLUCAP 130* 145*   D-Dimer No results for input(s): DDIMER in the last 72 hours. Hgb A1c No results for input(s): HGBA1C in the last 72 hours. Lipid Profile No results for input(s): CHOL, HDL, LDLCALC, TRIG, CHOLHDL, LDLDIRECT in the last 72 hours. Thyroid function studies No results for input(s): TSH, T4TOTAL, T3FREE, THYROIDAB in the last 72 hours.  Invalid input(s): FREET3 Anemia work up No results for input(s): VITAMINB12, FOLATE, FERRITIN, TIBC, IRON, RETICCTPCT in the last 72 hours. Urinalysis    Component Value Date/Time   COLORURINE YELLOW (A) 04/16/2019 2051   APPEARANCEUR HAZY (A) 04/16/2019 2051   LABSPEC 1.021 04/16/2019 2051   PHURINE 5.0 04/16/2019 2051   GLUCOSEU NEGATIVE 04/16/2019 2051   HGBUR NEGATIVE 04/16/2019 2051   BILIRUBINUR NEGATIVE 04/16/2019 2051   KETONESUR 5 (A) 04/16/2019 2051   PROTEINUR 100 (A) 04/16/2019 2051   NITRITE NEGATIVE 04/16/2019 2051   LEUKOCYTESUR NEGATIVE 04/16/2019 2051   Sepsis Labs Invalid input(s): PROCALCITONIN,  WBC,  LACTICIDVEN Microbiology Recent Results (from the past 240 hour(s))  Blood Culture (routine x 2)     Status: None (Preliminary result)   Collection Time: 04/16/19  6:42 PM   Specimen: BLOOD  Result Value Ref Range Status   Specimen Description BLOOD RIGHT ANTECUBITAL  Final   Special Requests   Final    BOTTLES DRAWN AEROBIC AND ANAEROBIC Blood Culture results may not be optimal due to an excessive volume of blood received in culture bottles   Culture   Final    NO GROWTH 4 DAYS Performed at [facility], [address], [place], [place] [address]    Report Status PENDING  Incomplete  Blood Culture (routine x 2)     Status: None (Preliminary result)   Collection Time: 04/16/19  6:42 PM   Specimen: BLOOD  Result Value Ref Range Status   Specimen Description BLOOD BLOOD LEFT HAND  Final   Special Requests   Final    BOTTLES DRAWN AEROBIC AND ANAEROBIC Blood Culture results may not be optimal due to an excessive volume of blood received in culture bottles   Culture   Final    NO GROWTH 4 DAYS Performed at [facility], [address] [place], [place] [address]    Report Status PENDING  Incomplete  Respiratory Panel by RT PCR (Flu A&B, Covid) - Nasopharyngeal Swab     Status: None   Collection Time: 04/16/19  7:37 PM   Specimen: Nasopharyngeal Swab  Result Value Ref Range Status   SARS Coronavirus 2 by RT PCR NEGATIVE NEGATIVE Final    Comment: (NOTE) SARS-CoV-2 target nucleic acids are NOT DETECTED. The SARS-CoV-2 RNA is generally detectable in upper respiratoy specimens during the acute phase of infection. The lowest concentration of SARS-CoV-2 viral copies this assay can detect is 131 copies/mL. A negative result does not preclude SARS-Cov-2 infection and should not be used as the sole basis for treatment or other patient management decisions. A negative result may occur with  improper specimen collection/handling, submission of specimen other than nasopharyngeal swab, presence of viral mutation(s) within the areas targeted by this assay, and inadequate number of viral copies (<131 copies/mL). A negative result must be combined with clinical  observations, patient history, and epidemiological information. The expected result is Negative. Fact Sheet for Patients:  PinkCheek.be Fact Sheet for Healthcare Providers:  GravelBags.it This test is not yet ap proved or cleared by the [place] FDA and  has been  authorized for detection and/or diagnosis of SARS-CoV-2 by FDA under an Emergency Use Authorization (EUA). This EUA will remain  in effect (meaning this test can be used) for the duration of the COVID-19 declaration under Section 564(b)(1) of the Act, 21 U.S.C. section 360bbb-3(b)(1), unless the authorization is terminated or revoked sooner.    Influenza A by PCR NEGATIVE NEGATIVE Final   Influenza B by PCR NEGATIVE NEGATIVE Final    Comment: (NOTE) The Xpert Xpress SARS-CoV-2/FLU/RSV assay is intended as an aid in  the diagnosis of influenza from Nasopharyngeal swab specimens and  should not be used as a sole basis for treatment. Nasal washings and  aspirates are unacceptable for Xpert Xpress SARS-CoV-2/FLU/RSV  testing. Fact Sheet for Patients: PinkCheek.be Fact Sheet for Healthcare Providers: GravelBags.it This test is not yet approved or cleared by the [place] FDA and  has been authorized for detection and/or diagnosis of SARS-CoV-2 by  FDA under an Emergency Use Authorization (EUA). This EUA will remain  in effect (meaning this test can be used) for the duration of the  Covid-19 declaration under Section 564(b)(1) of the Act, 21  U.S.C. section 360bbb-3(b)(1), unless the authorization is  terminated or revoked. Performed at [facility], [address], [place], [place] [address]   Urine culture     Status: None   Collection Time: 04/16/19  8:51 PM   Specimen: In/Out Cath Urine  Result Value Ref Range Status   Specimen Description   Final    IN/OUT CATH URINE Performed at [facility], [address] [place], [place] [address]    Special Requests   Final    NONE Performed at [facility], [address], [place], [place] [address]    Culture   Final    NO GROWTH Performed at [facility], [place] [address], [place], [place] [address]    Report Status 04/18/2019 FINAL   Final  MRSA PCR Screening     Status: None   Collection Time: 04/17/19 12:56 PM   Specimen: Nasopharyngeal  Result Value Ref Range Status   MRSA by PCR NEGATIVE NEGATIVE Final    Comment:        The GeneXpert MRSA Assay (FDA approved for NASAL specimens only), is one component of a comprehensive MRSA colonization surveillance program. It is not intended to diagnose MRSA infection nor to guide or monitor treatment for MRSA infections. Performed at [facility], [address] [place],  [address]      Time coordinating discharge: Over 30 minutes  SIGNED:   Sidney Ace, MD  [facility] 04/20/2019, 11:50 AM Pager   If 7PM-7AM, please contact night-coverage

## 2019-04-21 LAB — CULTURE, BLOOD (ROUTINE X 2)
Culture: NO GROWTH
Culture: NO GROWTH

## 2019-05-03 LAB — BLOOD GAS, VENOUS
Delivery systems: POSITIVE
FIO2: 100
Patient temperature: 37
pCO2, Ven: >120 mmHg (ref 44.0–60.0)
pH, Ven: 7.16 — CL (ref 7.250–7.430)
pO2, Ven: 100 mmHg — ABNORMAL HIGH (ref 32.0–45.0)

## 2019-05-29 ENCOUNTER — Inpatient Hospital Stay: Payer: Medicaid Other

## 2019-05-29 ENCOUNTER — Emergency Department: Payer: Medicaid Other

## 2019-05-29 ENCOUNTER — Inpatient Hospital Stay
Admission: EM | Admit: 2019-05-29 | Discharge: 2019-06-03 | DRG: 189 | Disposition: A | Discharge status: Home / Self Care | Payer: Medicaid Other | Attending: Internal Medicine | Admitting: Internal Medicine

## 2019-05-29 ENCOUNTER — Emergency Department (HOSPITAL_COMMUNITY): Payer: Medicaid Other

## 2019-05-29 ENCOUNTER — Other Ambulatory Visit: Payer: Self-pay

## 2019-05-29 DIAGNOSIS — S01112S Laceration without foreign body of left eyelid and periocular area, sequela: Secondary | ICD-10-CM | POA: Diagnosis not present

## 2019-05-29 DIAGNOSIS — J431 Panlobular emphysema: Secondary | ICD-10-CM | POA: Diagnosis present

## 2019-05-29 DIAGNOSIS — J9622 Acute and chronic respiratory failure with hypercapnia: Secondary | ICD-10-CM | POA: Diagnosis present

## 2019-05-29 DIAGNOSIS — G9341 Metabolic encephalopathy: Secondary | ICD-10-CM

## 2019-05-29 DIAGNOSIS — J449 Chronic obstructive pulmonary disease, unspecified: Secondary | ICD-10-CM | POA: Diagnosis not present

## 2019-05-29 DIAGNOSIS — Y92002 Bathroom of unspecified non-institutional (private) residence single-family (private) house as the place of occurrence of the external cause: Secondary | ICD-10-CM | POA: Diagnosis not present

## 2019-05-29 DIAGNOSIS — I248 Other forms of acute ischemic heart disease: Secondary | ICD-10-CM | POA: Diagnosis present

## 2019-05-29 DIAGNOSIS — Z66 Do not resuscitate: Secondary | ICD-10-CM | POA: Diagnosis not present

## 2019-05-29 DIAGNOSIS — R7611 Nonspecific reaction to tuberculin skin test without active tuberculosis: Secondary | ICD-10-CM | POA: Diagnosis present

## 2019-05-29 DIAGNOSIS — Z515 Encounter for palliative care: Secondary | ICD-10-CM | POA: Diagnosis not present

## 2019-05-29 DIAGNOSIS — I272 Pulmonary hypertension, unspecified: Secondary | ICD-10-CM | POA: Diagnosis present

## 2019-05-29 DIAGNOSIS — Z87891 Personal history of nicotine dependence: Secondary | ICD-10-CM | POA: Diagnosis not present

## 2019-05-29 DIAGNOSIS — D696 Thrombocytopenia, unspecified: Secondary | ICD-10-CM | POA: Diagnosis present

## 2019-05-29 DIAGNOSIS — Z7189 Other specified counseling: Secondary | ICD-10-CM

## 2019-05-29 DIAGNOSIS — Z79899 Other long term (current) drug therapy: Secondary | ICD-10-CM | POA: Diagnosis not present

## 2019-05-29 DIAGNOSIS — J9621 Acute and chronic respiratory failure with hypoxia: Secondary | ICD-10-CM | POA: Diagnosis present

## 2019-05-29 DIAGNOSIS — W01198A Fall on same level from slipping, tripping and stumbling with subsequent striking against other object, initial encounter: Secondary | ICD-10-CM | POA: Diagnosis present

## 2019-05-29 DIAGNOSIS — Z20822 Contact with and (suspected) exposure to covid-19: Secondary | ICD-10-CM | POA: Diagnosis present

## 2019-05-29 DIAGNOSIS — Z825 Family history of asthma and other chronic lower respiratory diseases: Secondary | ICD-10-CM

## 2019-05-29 DIAGNOSIS — R778 Other specified abnormalities of plasma proteins: Secondary | ICD-10-CM | POA: Diagnosis not present

## 2019-05-29 DIAGNOSIS — G935 Compression of brain: Secondary | ICD-10-CM | POA: Diagnosis present

## 2019-05-29 DIAGNOSIS — Z9981 Dependence on supplemental oxygen: Secondary | ICD-10-CM | POA: Diagnosis not present

## 2019-05-29 DIAGNOSIS — R7989 Other specified abnormal findings of blood chemistry: Secondary | ICD-10-CM | POA: Diagnosis present

## 2019-05-29 DIAGNOSIS — R55 Syncope and collapse: Secondary | ICD-10-CM

## 2019-05-29 DIAGNOSIS — E872 Acidosis: Secondary | ICD-10-CM | POA: Diagnosis not present

## 2019-05-29 DIAGNOSIS — S01112A Laceration without foreign body of left eyelid and periocular area, initial encounter: Secondary | ICD-10-CM | POA: Diagnosis present

## 2019-05-29 DIAGNOSIS — J441 Chronic obstructive pulmonary disease with (acute) exacerbation: Secondary | ICD-10-CM | POA: Diagnosis not present

## 2019-05-29 DIAGNOSIS — Z9119 Patient's noncompliance with other medical treatment and regimen: Secondary | ICD-10-CM | POA: Diagnosis not present

## 2019-05-29 DIAGNOSIS — Z23 Encounter for immunization: Secondary | ICD-10-CM

## 2019-05-29 LAB — URINALYSIS, COMPLETE (UACMP) WITH MICROSCOPIC
Bacteria, UA: NONE SEEN
Bilirubin Urine: NEGATIVE
Glucose, UA: NEGATIVE mg/dL
Hgb urine dipstick: NEGATIVE
Ketones, ur: NEGATIVE mg/dL
Leukocytes,Ua: NEGATIVE
Nitrite: NEGATIVE
Protein, ur: 100 mg/dL — AB
Specific Gravity, Urine: 1.036 — ABNORMAL HIGH (ref 1.005–1.030)
Squamous Epithelial / HPF: NONE SEEN (ref 0–5)
pH: 7 (ref 5.0–8.0)

## 2019-05-29 LAB — CBC
HCT: 35.9 % — ABNORMAL LOW (ref 39.0–52.0)
Hemoglobin: 10.7 g/dL — ABNORMAL LOW (ref 13.0–17.0)
MCH: 31.4 pg (ref 26.0–34.0)
MCHC: 29.8 g/dL — ABNORMAL LOW (ref 30.0–36.0)
MCV: 105.3 fL — ABNORMAL HIGH (ref 80.0–100.0)
Platelets: 122 10*3/uL — ABNORMAL LOW (ref 150–400)
RBC: 3.41 MIL/uL — ABNORMAL LOW (ref 4.22–5.81)
RDW: 12.5 % (ref 11.5–15.5)
WBC: 8.7 10*3/uL (ref 4.0–10.5)
nRBC: 0 % (ref 0.0–0.2)

## 2019-05-29 LAB — GLUCOSE, CAPILLARY
Glucose-Capillary: 138 mg/dL — ABNORMAL HIGH (ref 70–99)
Glucose-Capillary: 108 mg/dL — ABNORMAL HIGH (ref 70–99)

## 2019-05-29 LAB — URINE DRUG SCREEN, QUALITATIVE (ARMC ONLY)
Amphetamines, Ur Screen: NOT DETECTED
Barbiturates, Ur Screen: NOT DETECTED
Benzodiazepine, Ur Scrn: NOT DETECTED
Cannabinoid 50 Ng, Ur ~~LOC~~: NOT DETECTED
Cocaine Metabolite,Ur ~~LOC~~: NOT DETECTED
MDMA (Ecstasy)Ur Screen: NOT DETECTED
Methadone Scn, Ur: NOT DETECTED
Opiate, Ur Screen: NOT DETECTED
Phencyclidine (PCP) Ur S: NOT DETECTED
Tricyclic, Ur Screen: NOT DETECTED

## 2019-05-29 LAB — BLOOD GAS, ARTERIAL
Acid-Base Excess: 31.2 mmol/L — ABNORMAL HIGH (ref 0.0–2.0)
Allens test (pass/fail): POSITIVE — AB
Bicarbonate: 64.4 mmol/L — ABNORMAL HIGH (ref 20.0–28.0)
Delivery systems: POSITIVE
Expiratory PAP: 5
FIO2: 40
Inspiratory PAP: 14
O2 Saturation: 94.9 %
Patient temperature: 37
RATE: 8 resp/min
pCO2 arterial: 114 mmHg (ref 32.0–48.0)
pH, Arterial: 7.36 (ref 7.350–7.450)
pO2, Arterial: 78 mmHg — ABNORMAL LOW (ref 83.0–108.0)

## 2019-05-29 LAB — COMPREHENSIVE METABOLIC PANEL
ALT: 20 U/L (ref 0–44)
AST: 29 U/L (ref 15–41)
Albumin: 4.2 g/dL (ref 3.5–5.0)
Alkaline Phosphatase: 48 U/L (ref 38–126)
Anion gap: 11 (ref 5–15)
BUN: 20 mg/dL (ref 6–20)
CO2: 50 mmol/L — ABNORMAL HIGH (ref 22–32)
Calcium: 8.9 mg/dL (ref 8.9–10.3)
Chloride: 84 mmol/L — ABNORMAL LOW (ref 98–111)
Creatinine, Ser: 0.91 mg/dL (ref 0.61–1.24)
GFR calc Af Amer: >60 mL/min (ref >60)
GFR calc non Af Amer: >60 mL/min (ref >60)
Glucose, Bld: 105 mg/dL — ABNORMAL HIGH (ref 70–99)
Potassium: 4.7 mmol/L (ref 3.5–5.1)
Sodium: 145 mmol/L (ref 135–145)
Total Bilirubin: 0.8 mg/dL (ref 0.3–1.2)
Total Protein: 7.3 g/dL (ref 6.5–8.1)

## 2019-05-29 LAB — RESPIRATORY PANEL BY RT PCR (FLU A&B, COVID)
Influenza A by PCR: NEGATIVE
Influenza B by PCR: NEGATIVE
SARS Coronavirus 2 by RT PCR: NEGATIVE

## 2019-05-29 LAB — TROPONIN I (HIGH SENSITIVITY)
Troponin I (High Sensitivity): 30 ng/L — ABNORMAL HIGH (ref <18)
Troponin I (High Sensitivity): 39 ng/L — ABNORMAL HIGH (ref <18)
Troponin I (High Sensitivity): 35 ng/L — ABNORMAL HIGH (ref <18)

## 2019-05-29 LAB — MRSA PCR SCREENING: MRSA by PCR: NEGATIVE

## 2019-05-29 LAB — ETHANOL: Alcohol, Ethyl (B): <10 mg/dL (ref <10)

## 2019-05-29 MED ORDER — HEPARIN SODIUM (PORCINE) 5000 UNIT/ML IJ SOLN: 5000.0000 [IU] | Freq: Three times a day (TID) | INTRAMUSCULAR | Status: DC

## 2019-05-29 MED ORDER — IOHEXOL 350 MG/ML SOLN
75.0000 mL | Freq: Once | INTRAVENOUS | Status: AC | PRN
  Administered 2019-05-29: 10:00:00 75 mL via INTRAVENOUS

## 2019-05-29 MED ORDER — ACETAMINOPHEN 325 MG PO TABS: 650.0000 mg | ORAL_TABLET | Freq: Four times a day (QID) | ORAL | Status: DC | PRN

## 2019-05-29 MED ORDER — MOMETASONE FURO-FORMOTEROL FUM 200-5 MCG/ACT IN AERO
2.0000 | INHALATION_SPRAY | Freq: Two times a day (BID) | RESPIRATORY_TRACT | Status: DC
  Administered 2019-05-29 – 2019-06-03 (×10): 2 via RESPIRATORY_TRACT
  Filled 2019-05-29 (×2): qty 8.8

## 2019-05-29 MED ORDER — ASPIRIN EC 81 MG PO TBEC
81.0000 mg | DELAYED_RELEASE_TABLET | Freq: Every day | ORAL | Status: DC
  Administered 2019-05-29 – 2019-06-03 (×6): 81 mg via ORAL
  Filled 2019-05-29 (×6): qty 1

## 2019-05-29 MED ORDER — IOHEXOL 300 MG/ML  SOLN: 75.0000 mL | Freq: Once | INTRAMUSCULAR | Status: DC | PRN

## 2019-05-29 MED ORDER — IPRATROPIUM BROMIDE 0.02 % IN SOLN
0.5000 mg | RESPIRATORY_TRACT | Status: DC
  Administered 2019-05-29 (×3): 0.5 mg via RESPIRATORY_TRACT
  Filled 2019-05-29 (×4): qty 2.5

## 2019-05-29 MED ORDER — TIOTROPIUM BROMIDE MONOHYDRATE 18 MCG IN CAPS
18.0000 ug | ORAL_CAPSULE | Freq: Every day | RESPIRATORY_TRACT | Status: DC
  Administered 2019-05-30 – 2019-06-03 (×5): 18 ug via RESPIRATORY_TRACT
  Filled 2019-05-29: qty 5

## 2019-05-29 MED ORDER — ENOXAPARIN SODIUM 40 MG/0.4ML ~~LOC~~ SOLN
40.0000 mg | SUBCUTANEOUS | Status: DC
  Administered 2019-05-29 – 2019-06-02 (×5): 40 mg via SUBCUTANEOUS
  Filled 2019-05-29 (×5): qty 0.4

## 2019-05-29 MED ORDER — ACETAMINOPHEN 650 MG RE SUPP: 650.0000 mg | Freq: Four times a day (QID) | RECTAL | Status: DC | PRN

## 2019-05-29 MED ORDER — TETANUS-DIPHTH-ACELL PERTUSSIS 5-2.5-18.5 LF-MCG/0.5 IM SUSP
0.5000 mL | Freq: Once | INTRAMUSCULAR | Status: AC
  Administered 2019-05-29: 08:00:00 0.5 mL via INTRAMUSCULAR
  Filled 2019-05-29 (×2): qty 0.5

## 2019-05-29 MED ORDER — CHLORHEXIDINE GLUCONATE CLOTH 2 % EX PADS
6.0000 | MEDICATED_PAD | Freq: Every day | CUTANEOUS | Status: DC
  Administered 2019-05-30 – 2019-06-03 (×5): 6 via TOPICAL

## 2019-05-29 MED ORDER — METHYLPREDNISOLONE SODIUM SUCC 40 MG IJ SOLR
40.0000 mg | Freq: Two times a day (BID) | INTRAMUSCULAR | Status: DC
  Administered 2019-05-29: 40 mg via INTRAVENOUS
  Filled 2019-05-29: qty 1

## 2019-05-29 MED ORDER — ONDANSETRON HCL 4 MG PO TABS: 4.0000 mg | ORAL_TABLET | Freq: Four times a day (QID) | ORAL | Status: DC | PRN

## 2019-05-29 MED ORDER — DM-GUAIFENESIN ER 30-600 MG PO TB12
1.0000 | ORAL_TABLET | Freq: Two times a day (BID) | ORAL | Status: DC
  Administered 2019-05-29 – 2019-06-03 (×11): 1 via ORAL
  Filled 2019-05-29 (×11): qty 1

## 2019-05-29 MED ORDER — ONDANSETRON HCL 4 MG/2ML IJ SOLN: 4.0000 mg | Freq: Four times a day (QID) | INTRAMUSCULAR | Status: DC | PRN

## 2019-05-29 MED ORDER — ALBUTEROL SULFATE (2.5 MG/3ML) 0.083% IN NEBU: 2.5000 mg | INHALATION_SOLUTION | RESPIRATORY_TRACT | Status: DC | PRN

## 2019-05-29 NOTE — Consult Note (Signed)
Name: Chris Case MRN: [id number] DOB: 05-13-1970     CONSULTATION DATE: 05/29/2019  REFERRING MD : Blaine Hamper  CHIEF COMPLAINT:   STUDIES:    05/29/19  CT chest Independently reviewed by Me No acute findings +emphysema  HISTORY OF PRESENT ILLNESS:  49 y.o. male with medical history significant of COPD on 3 L oxygen, on BiPAP, thrombocytopenia, Chiari malformation, who presents with syncope and AMS.  history of COPD, supposed to use of BiPAP, but patient is not using BiPAP consistently.    He has shortness breath, cough, but no chest pain.  No fever or chills.    Last night, patient passed out in bathroom, fell, and struck his eye on bathroom sink. Has has a small laceration above left eye which is repaired by EDP.   Pt does not have nausea vomiting, diarrhea, abdominal pain, symptoms of UTI.  He moves all extremities.  No facial droop or slurred speech.  Patient is alert and awake following commands  ED Course:  pt was found to have troponin 30, 39, 35, WBC 8.7  negative Covid PCR  alcohol level less than 10, electrolytes renal function okay,   Initial VBG with pH 7.17, CO2 >120, O2 173.  Patient is put on BiPAP.  The repeat ABG showed pH 7.36, CO2 114, O2 78.   Chest x-ray negative.  CT of maxillofacial image is negative for fracture.  CT of C-spine is negative for bony fracture.  CT of head is negative for acute intracranial abnormalities, but showed known Chiari I malformation.  Patient is admitted to stepdown as inpatient.    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  has a past surgical history that includes Appendectomy (late 1980's) and laparotomy (N/A, 12/06/2016). Prior to Admission medications   Medication Sig Start Date End Date Taking? Authorizing Provider  albuterol (PROVENTIL) (2.5 MG/3ML) 0.083%  nebulizer solution Inhale 3 mLs into the lungs every 4 (four) hours as needed for wheezing or shortness of breath. 09/26/15  Yes Theodoro Grist, MD  albuterol (VENTOLIN HFA) 108 (90 Base) MCG/ACT inhaler Inhale 2 puffs into the lungs every 4 (four) hours as needed for shortness of breath. 01/09/19  Yes [provider]  mometasone-formoterol (DULERA) 200-5 MCG/ACT AERO Inhale 2 puffs into the lungs 2 (two) times daily. 09/26/15  Yes Theodoro Grist, MD  Tiotropium Bromide Monohydrate (SPIRIVA RESPIMAT) 1.25 MCG/ACT AERS Inhale 1 puff into the lungs daily.    Yes [provider]   No Known Allergies  FAMILY HISTORY:  family history includes COPD in his mother. SOCIAL HISTORY:  reports that he quit smoking about 3 years ago. His smoking use included cigarettes. He has a 60.00 pack-year smoking history. He has never used smokeless tobacco. He reports current drug use. Drug: Marijuana. He reports that he does not drink alcohol.    Review of Systems:  Gen:  Denies  fever, sweats, chills weigh loss  HEENT: Denies blurred vision, double vision, ear pain, eye pain, hearing loss, nose bleeds, sore throat Cardiac:  No dizziness, chest pain or heaviness, chest tightness,edema, No JVD Resp:   No cough, -sputum production, +shortness of breath,-wheezing, -hemoptysis,  Gi: Denies swallowing difficulty, stomach pain, nausea or vomiting, diarrhea, constipation, bowel incontinence Gu:  Denies bladder incontinence, burning urine Ext:   Denies Joint pain, stiffness or swelling Skin: Denies  skin rash, easy bruising or bleeding or hives Endoc:  Denies polyuria, polydipsia , polyphagia or weight change Psych:   Denies depression, insomnia or hallucinations  Other:  All other systems negative     VITAL SIGNS: Temp:  [97.6 F (36.4 C)-98.9 F (37.2 C)] 97.6 F (36.4 C) (03/27 1547) Pulse Rate:  [57-109] 57 (03/27 1600) Resp:  [10-22] 21 (03/27 1600) BP: (94-116)/(65-85) 104/82 (03/27  1600) SpO2:  [94 %-100 %] 100 % (03/27 1600) FiO2 (%):  [40 %] 40 % (03/27 1507) Weight:  [55.2 kg-56.6 kg] 55.2 kg (03/27 1547)     SpO2: 100 % O2 Flow Rate (L/min): 3 L/min FiO2 (%): 40 %    Physical Examination:   GENERAL:NAD, no fevers, chills, no weakness no fatigue HEAD: Normocephalic, atraumatic.  EYES: PERLA, EOMI No scleral icterus.  MOUTH: Moist mucosal membrane.  EAR, NOSE, THROAT: Clear without exudates. No external lesions.  NECK: Supple.  PULMONARY: CTA B/L no wheezing, rhonchi, crackles CARDIOVASCULAR: S1 and S2. Regular rate and rhythm. No murmurs GASTROINTESTINAL: Soft, nontender, nondistended. Positive bowel sounds.  MUSCULOSKELETAL: No swelling, clubbing, or edema.  NEUROLOGIC: No gross focal neurological deficits. 5/5 strength all extremities SKIN: No ulceration, lesions, rashes, or cyanosis.  PSYCHIATRIC: Insight, judgment intact. -depression -anxiety ALL OTHER ROS ARE NEGATIVE   MEDICATIONS: I have reviewed all medications and confirmed regimen as documented    CULTURE RESULTS   Recent Results (from the past 240 hour(s))  Respiratory Panel by RT PCR (Flu A&B, Covid) - Nasopharyngeal Swab     Status: None   Collection Time: 05/29/19  5:32 AM   Specimen: Nasopharyngeal Swab  Result Value Ref Range Status   SARS Coronavirus 2 by RT PCR NEGATIVE NEGATIVE Final    Comment: (NOTE) SARS-CoV-2 target nucleic acids are NOT DETECTED. The SARS-CoV-2 RNA is generally detectable in upper respiratoy specimens during the acute phase of infection. The lowest concentration of SARS-CoV-2 viral copies this assay can detect is 131 copies/mL. A negative result does not preclude SARS-Cov-2 infection and should not be used as the sole basis for treatment or other patient management decisions. A negative result may occur with  improper specimen collection/handling, submission of specimen other than nasopharyngeal swab, presence of viral mutation(s) within the areas  targeted by this assay, and inadequate number of viral copies (<131 copies/mL). A negative result must be combined with clinical observations, patient history, and epidemiological information. The expected result is Negative. Fact Sheet for Patients:  https://www.moore.com/https://www.fda.gov/media/142436/download Fact Sheet for Healthcare Providers:  https://www.young.biz/https://www.fda.gov/media/142435/download This test is not yet ap proved or cleared by the [place] FDA and  has been authorized for detection and/or diagnosis of SARS-CoV-2 by FDA under an Emergency Use Authorization (EUA). This EUA will remain  in effect (meaning this test can be used) for the duration of the COVID-19 declaration under Section 564(b)(1) of the Act, 21 U.S.C. section 360bbb-3(b)(1), unless the authorization is terminated or revoked sooner.    Influenza A by PCR NEGATIVE NEGATIVE Final   Influenza B by PCR NEGATIVE NEGATIVE Final    Comment: (NOTE) The Xpert Xpress SARS-CoV-2/FLU/RSV assay is intended as an aid in  the diagnosis of influenza from Nasopharyngeal swab specimens and  should not be used as a sole basis for treatment. Nasal washings and  aspirates are unacceptable for Xpert Xpress SARS-CoV-2/FLU/RSV  testing. Fact Sheet for Patients: https://www.moore.com/https://www.fda.gov/media/142436/download Fact Sheet for Healthcare Providers: https://www.young.biz/https://www.fda.gov/media/142435/download This test is not yet approved or cleared by the [place] FDA and  has been  authorized for detection and/or diagnosis of SARS-CoV-2 by  FDA under an Emergency Use Authorization (EUA). This EUA will remain  in effect (meaning this test can be used) for the duration of the  Covid-19 declaration under Section 564(b)(1) of the Act, 21  U.S.C. section 360bbb-3(b)(1), unless the authorization is  terminated or revoked. Performed at [facility], [address] [place], [place] [address]           IMAGING    CT Head Wo Contrast  Result Date:  05/29/2019 CLINICAL DATA:  Initial evaluation for acute near syncope, fall. Facial trauma. EXAM: CT HEAD WITHOUT CONTRAST CT MAXILLOFACIAL WITHOUT CONTRAST CT CERVICAL SPINE WITHOUT CONTRAST TECHNIQUE: Multidetector CT imaging of the head, cervical spine, and maxillofacial structures were performed using the standard protocol without intravenous contrast. Multiplanar CT image reconstructions of the cervical spine and maxillofacial structures were also generated. COMPARISON:  None. FINDINGS: CT HEAD FINDINGS Brain: Cerebral volume within normal limits. No acute intracranial hemorrhage. No acute large vessel territory infarct. No mass lesion or midline shift. No hydrocephalus or extra-axial fluid collection. Chiari 1 malformation noted. Vascular: No hyperdense vessel. Skull: Scalp soft tissues and calvarium within normal limits. Other: No mastoid effusion. CT MAXILLOFACIAL FINDINGS Osseous: Zygomatic arches intact. No acute maxillary fracture. Pterygoid plates intact. Nasal bones intact. Nasal septum intact. No acute mandibular fracture. No acute abnormality about the dentition. Orbits: Globes and orbital soft tissues within normal limits. Bony orbits intact. Sinuses: Paranasal sinuses are largely clear. Soft tissues: No visible soft tissue injury about the face. CT CERVICAL SPINE FINDINGS Alignment: Examination degraded by motion artifact. Straightening with smooth reversal of the normal cervical lordosis. No listhesis or subluxation. Skull base and vertebrae: Visualized skull base intact. Normal C1-2 articulations are preserved in the dens is intact. Vertebral body height maintained. No acute fracture. Soft tissues and spinal canal: Visualized soft tissues demonstrate no acute finding. No abnormal prevertebral edema. Spinal canal within normal limits. Disc levels: Mild to moderate multilevel cervical spondylosis at C3-4 through C6-7. Upper chest: Visualized upper chest demonstrates no acute finding. Advanced  emphysematous changes noted at the lung apices. Other: None. IMPRESSION: CT HEAD: 1. No acute intracranial abnormality. 2. Chiari 1 malformation. CT MAXILLOFACIAL: No acute maxillofacial injury.  No fracture. CT CERVICAL SPINE: 1. No acute traumatic injury within the cervical spine. 2. Mild to moderate multilevel cervical spondylosis at C3-4 through C6-7. 3.  Emphysema (ICD10-J43.9). Electronically Signed   By: Rise Mu M.D.   On: 05/29/2019 04:07   CT ANGIO CHEST PE W OR WO CONTRAST  Result Date: 05/29/2019 CLINICAL DATA:  Syncope, facial laceration, no chest pain or shortness of breath EXAM: CT ANGIOGRAPHY CHEST WITH CONTRAST TECHNIQUE: Multidetector CT imaging of the chest was performed using the standard protocol during bolus administration of intravenous contrast. Multiplanar CT image reconstructions and MIPs were obtained to evaluate the vascular anatomy. CONTRAST:  55mL OMNIPAQUE IOHEXOL 350 MG/ML SOLN COMPARISON:  09/23/2013 FINDINGS: Cardiovascular: Cardiomegaly with biventricular dilatation. No pericardial effusion. Dilated central pulmonary arteries. Satisfactory opacification of pulmonary arteries noted, and there is no evidence of pulmonary emboli. Adequate contrast opacification of the thoracic aorta with no evidence of dissection, aneurysm, or stenosis. There is classic 3-vessel brachiocephalic arch anatomy without proximal stenosis. Minimal aortic plaque in the arch. Mediastinum/Nodes: No hilar or mediastinal adenopathy. Lungs/Pleura: Pulmonary emphysema (ICD10-J43.9). No pleural effusion. No pneumothorax. Upper Abdomen: No acute findings. Musculoskeletal: No chest wall abnormality. No acute or significant osseous findings. Review of the MIP images confirms  the above findings. IMPRESSION: 1. Negative for acute PE or thoracic aortic dissection. 2. Dilated central pulmonary arteries suggesting pulmonary arterial hypertension. Emphysema (ICD10-J43.9). Electronically Signed   By: Corlis Leak M.D.   On: 05/29/2019 10:32   CT Cervical Spine Wo Contrast  Result Date: 05/29/2019 CLINICAL DATA:  Initial evaluation for acute near syncope, fall. Facial trauma. EXAM: CT HEAD WITHOUT CONTRAST CT MAXILLOFACIAL WITHOUT CONTRAST CT CERVICAL SPINE WITHOUT CONTRAST TECHNIQUE: Multidetector CT imaging of the head, cervical spine, and maxillofacial structures were performed using the standard protocol without intravenous contrast. Multiplanar CT image reconstructions of the cervical spine and maxillofacial structures were also generated. COMPARISON:  None. FINDINGS: CT HEAD FINDINGS Brain: Cerebral volume within normal limits. No acute intracranial hemorrhage. No acute large vessel territory infarct. No mass lesion or midline shift. No hydrocephalus or extra-axial fluid collection. Chiari 1 malformation noted. Vascular: No hyperdense vessel. Skull: Scalp soft tissues and calvarium within normal limits. Other: No mastoid effusion. CT MAXILLOFACIAL FINDINGS Osseous: Zygomatic arches intact. No acute maxillary fracture. Pterygoid plates intact. Nasal bones intact. Nasal septum intact. No acute mandibular fracture. No acute abnormality about the dentition. Orbits: Globes and orbital soft tissues within normal limits. Bony orbits intact. Sinuses: Paranasal sinuses are largely clear. Soft tissues: No visible soft tissue injury about the face. CT CERVICAL SPINE FINDINGS Alignment: Examination degraded by motion artifact. Straightening with smooth reversal of the normal cervical lordosis. No listhesis or subluxation. Skull base and vertebrae: Visualized skull base intact. Normal C1-2 articulations are preserved in the dens is intact. Vertebral body height maintained. No acute fracture. Soft tissues and spinal canal: Visualized soft tissues demonstrate no acute finding. No abnormal prevertebral edema. Spinal canal within normal limits. Disc levels: Mild to moderate multilevel cervical spondylosis at C3-4 through  C6-7. Upper chest: Visualized upper chest demonstrates no acute finding. Advanced emphysematous changes noted at the lung apices. Other: None. IMPRESSION: CT HEAD: 1. No acute intracranial abnormality. 2. Chiari 1 malformation. CT MAXILLOFACIAL: No acute maxillofacial injury.  No fracture. CT CERVICAL SPINE: 1. No acute traumatic injury within the cervical spine. 2. Mild to moderate multilevel cervical spondylosis at C3-4 through C6-7. 3.  Emphysema (ICD10-J43.9). Electronically Signed   By: Rise Mu M.D.   On: 05/29/2019 04:07   DG Chest Portable 1 View  Result Date: 05/29/2019 CLINICAL DATA:  Status post fall, history of COPD EXAM: PORTABLE CHEST 1 VIEW COMPARISON:  04/18/2019 FINDINGS: None The lungs are hyperinflated likely secondary to COPD. There is no focal consolidation. There is no pleural effusion or pneumothorax. The heart and mediastinal contours are unremarkable. There is no acute osseous abnormality. IMPRESSION: No active disease. Electronically Signed   By: Elige Ko   On: 05/29/2019 05:50   CT Maxillofacial Wo Contrast  Result Date: 05/29/2019 CLINICAL DATA:  Initial evaluation for acute near syncope, fall. Facial trauma. EXAM: CT HEAD WITHOUT CONTRAST CT MAXILLOFACIAL WITHOUT CONTRAST CT CERVICAL SPINE WITHOUT CONTRAST TECHNIQUE: Multidetector CT imaging of the head, cervical spine, and maxillofacial structures were performed using the standard protocol without intravenous contrast. Multiplanar CT image reconstructions of the cervical spine and maxillofacial structures were also generated. COMPARISON:  None. FINDINGS: CT HEAD FINDINGS Brain: Cerebral volume within normal limits. No acute intracranial hemorrhage. No acute large vessel territory infarct. No mass lesion or midline shift. No hydrocephalus or extra-axial fluid collection. Chiari 1 malformation noted. Vascular: No hyperdense vessel. Skull: Scalp soft tissues and calvarium within normal limits. Other: No mastoid  effusion. CT MAXILLOFACIAL FINDINGS Osseous:  Zygomatic arches intact. No acute maxillary fracture. Pterygoid plates intact. Nasal bones intact. Nasal septum intact. No acute mandibular fracture. No acute abnormality about the dentition. Orbits: Globes and orbital soft tissues within normal limits. Bony orbits intact. Sinuses: Paranasal sinuses are largely clear. Soft tissues: No visible soft tissue injury about the face. CT CERVICAL SPINE FINDINGS Alignment: Examination degraded by motion artifact. Straightening with smooth reversal of the normal cervical lordosis. No listhesis or subluxation. Skull base and vertebrae: Visualized skull base intact. Normal C1-2 articulations are preserved in the dens is intact. Vertebral body height maintained. No acute fracture. Soft tissues and spinal canal: Visualized soft tissues demonstrate no acute finding. No abnormal prevertebral edema. Spinal canal within normal limits. Disc levels: Mild to moderate multilevel cervical spondylosis at C3-4 through C6-7. Upper chest: Visualized upper chest demonstrates no acute finding. Advanced emphysematous changes noted at the lung apices. Other: None. IMPRESSION: CT HEAD: 1. No acute intracranial abnormality. 2. Chiari 1 malformation. CT MAXILLOFACIAL: No acute maxillofacial injury.  No fracture. CT CERVICAL SPINE: 1. No acute traumatic injury within the cervical spine. 2. Mild to moderate multilevel cervical spondylosis at C3-4 through C6-7. 3.  Emphysema (ICD10-J43.9). Electronically Signed   By: Rise Mu M.D.   On: 05/29/2019 04:07       ASSESSMENT AND PLAN SYNOPSIS  ACUTE SYNCOPE FROM ACUTE COPD EXACERBATION due to NONCOMPLIANCE OF BIPAP  -No need for ABX -No need for steroids BD THERAPY BiPAP as needed BASELINE PCO2 is around 100-110  ELECTROLYTES -follow labs as needed -replace as needed -pharmacy consultation and following    DVT/GI PRX ordered TRANSFUSIONS AS NEEDED MONITOR FSBS ASSESS the need  for LABS as needed     Lucie Leather, M.D.  [facility]  Medical Director [facility] Medical Director [facility] Cardio-Pulmonary Department

## 2019-05-29 NOTE — ED Triage Notes (Signed)
Patient coming [facility] from home for near syncopal episode with fall. Patient struck his eye on bathroom sink, laceration noted above left eye. Patient has COPD, normally on 3L of home oxygen at baseline. Patient weak on EMS arrival.    EMS vitals: 99.4, HR102, BP171/91, CBG 222, CO2 35-40, O2 94% on 4L.

## 2019-05-29 NOTE — ED Notes (Signed)
Pt given urinal.  No other needs at this time.

## 2019-05-29 NOTE — ED Provider Notes (Signed)
[facility] Emergency Department Provider Note  ____________________________________________  Time seen: Approximately 5:51 AM  I have reviewed the triage vital signs and the nursing notes.   HISTORY  Chief Complaint Near Syncope  Level 5 caveat:  Portions of the history and physical were unable to be obtained due to AMS   HPI Chris Case is a 49 y.o. male with a history of COPD, noncompliant with therapies who presents for evaluation of syncopal event.  Patient arrived via EMS for a syncopal episode at home.  He reports that he was in the bathroom when he passed out.  He fell and hit his face onto the sink.  He sustained a laceration over the left eyelid.  Upon arrival to the emergency room patient was alert and oriented x3 with a GCS of 15.  He denies any chest pain or shortness of breath but was endorsing headache.  Patient was awaiting for several hours in the emergency room sleeping.  By the time he was brought to a room patient was less responsive.  Opens eyes to his name but will fall right back asleep.  Patient with recent similar presentation back in February in the setting of hypercapnic respiratory failure.  Patient unable to provide any history at this time.   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 for 6 mo w medications    Patient Active Problem List   Diagnosis Date Noted   Acute respiratory failure with hypoxia and hypercapnia (HCC) 05/29/2019   Acute on chronic respiratory failure with hypoxemia (HCC) 04/16/2019   Acute respiratory failure with hypoxia (HCC) 01/24/2019   Thrombocytopenia (HCC) 01/24/2019   Protein-calorie malnutrition, severe 12/10/2016   Intestinal adhesions with complete obstruction (HCC) 12/05/2016   Constipation    SBO (small bowel obstruction) (HCC)    Acute bronchitis 09/26/2015   COPD  exacerbation (HCC) 09/24/2015   Acute on chronic respiratory failure with hypoxia and hypercapnia (HCC) 09/24/2015   Abnormal ECG 09/24/2015   Edema, lower extremity 09/24/2015    Past Surgical History:  Procedure Laterality Date   APPENDECTOMY  late 1980's   LAPAROTOMY N/A 12/06/2016   Procedure: EXPLORATORY LAPAROTOMY- SBO;  Surgeon: [name], MD;  Location: [facility] ORS;  Service: General;  Laterality: N/A;    Prior to Admission medications   Medication Sig Start Date End Date Taking? Authorizing Provider  albuterol (PROVENTIL) (2.5 MG/3ML) 0.083% nebulizer solution Inhale 3 mLs into the lungs every 4 (four) hours as needed for wheezing or shortness of breath. 09/26/15  Yes Katharina Caper, MD  albuterol (VENTOLIN HFA) 108 (90 Base) MCG/ACT inhaler Inhale 2 puffs into the lungs every 4 (four) hours as needed for shortness of breath. 01/09/19  Yes [provider]  mometasone-formoterol (DULERA) 200-5 MCG/ACT AERO Inhale 2 puffs into the lungs 2 (two) times daily. 09/26/15  Yes Katharina Caper, MD  Tiotropium Bromide Monohydrate (SPIRIVA RESPIMAT) 1.25 MCG/ACT AERS Inhale 1 puff into the lungs daily.    Yes [provider]    Allergies Patient has no known allergies.  Family History  Problem Relation Age of Onset   COPD Mother     Social History Social History   Tobacco Use   Smoking status: Former Smoker    Packs/day: 3.00    Years: 20.00    Pack years: 60.00    Types: Cigarettes  Quit date: 04/11/2016    Years since quitting: 3.1   Smokeless tobacco: Never Used  Substance Use Topics   Alcohol use: No    Comment: former drinker-1 pint of gin/week-quit 2015   Drug use: Yes    Types: Marijuana    Comment: one joint /day x 2 y    Review of Systems  Constitutional: Negative for fever. + syncope Cardiovascular: Negative for chest pain. Neurological: + HA and AMS  ____________________________________________   PHYSICAL EXAM:  VITAL  SIGNS: ED Triage Vitals  Enc Vitals Group     BP 05/29/19 0120 105/65     Pulse Rate 05/29/19 0120 (!) 109     Resp 05/29/19 0120 (!) 22     Temp 05/29/19 0120 98.9 F (37.2 C)     Temp src --      SpO2 05/29/19 0120 100 %     Weight 05/29/19 0111 124 lb 12.5 oz (56.6 kg)     Height --      Head Circumference --      Peak Flow --      Pain Score 05/29/19 0111 0     Pain Loc --      Pain Edu? --      Excl. in GC? --     Constitutional: Somnolent, GCS 10 HEENT:      Head: Normocephalic and atraumatic.         Eyes: Conjunctivae are normal. Sclera is non-icteric. Superficial linear laceration of the L upper eyelid, PERRL      Mouth/Throat: Mucous membranes are moist. No intra-oral trauma      Neck: Supple with no signs of meningismus. Cardiovascular: Sinus tachycardia with regular rhythm. No murmurs, gallops, or rubs.  Respiratory: Normal respiratory effort decreased air movement bilaterally Gastrointestinal: Soft, non distended Musculoskeletal: Atraumatic extremities, atraumatic back Neurologic: GCS 10 Skin: Skin is warm, dry and intact. No rash noted.   ____________________________________________   LABS (all labs ordered are listed, but only abnormal results are displayed)  Labs Reviewed  CBC - Abnormal; Notable for the following components:      Result Value   RBC 3.41 (*)    Hemoglobin 10.7 (*)    HCT 35.9 (*)    MCV 105.3 (*)    MCHC 29.8 (*)    Platelets 122 (*)    All other components within normal limits  GLUCOSE, CAPILLARY - Abnormal; Notable for the following components:   Glucose-Capillary 138 (*)    All other components within normal limits  COMPREHENSIVE METABOLIC PANEL - Abnormal; Notable for the following components:   Chloride 84 (*)    CO2 50 (*)    Glucose, Bld 105 (*)    All other components within normal limits  BLOOD GAS, VENOUS - Abnormal; Notable for the following components:   pH, Ven 7.17 (*)    pCO2, Ven >120.0 (*)    pO2, Ven 173.0  (*)    All other components within normal limits  TROPONIN I (HIGH SENSITIVITY) - Abnormal; Notable for the following components:   Troponin I (High Sensitivity) 30 (*)    All other components within normal limits  RESPIRATORY PANEL BY RT PCR (FLU A&B, COVID)  ETHANOL  URINALYSIS, COMPLETE (UACMP) WITH MICROSCOPIC  URINE DRUG SCREEN, QUALITATIVE ([facility] ONLY)  CBG MONITORING, ED  TROPONIN I (HIGH SENSITIVITY)   ____________________________________________  EKG  ED ECG REPORT I, Nita Sicklearolina Taryll Reichenberger, the attending physician, personally viewed and interpreted this ECG.  Sinus tachycardia, rate of 112,  normal intervals, normal axis, LVH.  Unchanged from prior. ____________________________________________  RADIOLOGY  I have personally reviewed the images performed during this visit and I agree with the Radiologist's read.   Interpretation by Radiologist:  CT Head Wo Contrast  Result Date: 05/29/2019 CLINICAL DATA:  Initial evaluation for acute near syncope, fall. Facial trauma. EXAM: CT HEAD WITHOUT CONTRAST CT MAXILLOFACIAL WITHOUT CONTRAST CT CERVICAL SPINE WITHOUT CONTRAST TECHNIQUE: Multidetector CT imaging of the head, cervical spine, and maxillofacial structures were performed using the standard protocol without intravenous contrast. Multiplanar CT image reconstructions of the cervical spine and maxillofacial structures were also generated. COMPARISON:  None. FINDINGS: CT HEAD FINDINGS Brain: Cerebral volume within normal limits. No acute intracranial hemorrhage. No acute large vessel territory infarct. No mass lesion or midline shift. No hydrocephalus or extra-axial fluid collection. Chiari 1 malformation noted. Vascular: No hyperdense vessel. Skull: Scalp soft tissues and calvarium within normal limits. Other: No mastoid effusion. CT MAXILLOFACIAL FINDINGS Osseous: Zygomatic arches intact. No acute maxillary fracture. Pterygoid plates intact. Nasal bones intact. Nasal septum intact. No  acute mandibular fracture. No acute abnormality about the dentition. Orbits: Globes and orbital soft tissues within normal limits. Bony orbits intact. Sinuses: Paranasal sinuses are largely clear. Soft tissues: No visible soft tissue injury about the face. CT CERVICAL SPINE FINDINGS Alignment: Examination degraded by motion artifact. Straightening with smooth reversal of the normal cervical lordosis. No listhesis or subluxation. Skull base and vertebrae: Visualized skull base intact. Normal C1-2 articulations are preserved in the dens is intact. Vertebral body height maintained. No acute fracture. Soft tissues and spinal canal: Visualized soft tissues demonstrate no acute finding. No abnormal prevertebral edema. Spinal canal within normal limits. Disc levels: Mild to moderate multilevel cervical spondylosis at C3-4 through C6-7. Upper chest: Visualized upper chest demonstrates no acute finding. Advanced emphysematous changes noted at the lung apices. Other: None. IMPRESSION: CT HEAD: 1. No acute intracranial abnormality. 2. Chiari 1 malformation. CT MAXILLOFACIAL: No acute maxillofacial injury.  No fracture. CT CERVICAL SPINE: 1. No acute traumatic injury within the cervical spine. 2. Mild to moderate multilevel cervical spondylosis at C3-4 through C6-7. 3.  Emphysema (ICD10-J43.9). Electronically Signed   By: Jeannine Boga M.D.   On: 05/29/2019 04:07   CT Cervical Spine Wo Contrast  Result Date: 05/29/2019 CLINICAL DATA:  Initial evaluation for acute near syncope, fall. Facial trauma. EXAM: CT HEAD WITHOUT CONTRAST CT MAXILLOFACIAL WITHOUT CONTRAST CT CERVICAL SPINE WITHOUT CONTRAST TECHNIQUE: Multidetector CT imaging of the head, cervical spine, and maxillofacial structures were performed using the standard protocol without intravenous contrast. Multiplanar CT image reconstructions of the cervical spine and maxillofacial structures were also generated. COMPARISON:  None. FINDINGS: CT HEAD FINDINGS  Brain: Cerebral volume within normal limits. No acute intracranial hemorrhage. No acute large vessel territory infarct. No mass lesion or midline shift. No hydrocephalus or extra-axial fluid collection. Chiari 1 malformation noted. Vascular: No hyperdense vessel. Skull: Scalp soft tissues and calvarium within normal limits. Other: No mastoid effusion. CT MAXILLOFACIAL FINDINGS Osseous: Zygomatic arches intact. No acute maxillary fracture. Pterygoid plates intact. Nasal bones intact. Nasal septum intact. No acute mandibular fracture. No acute abnormality about the dentition. Orbits: Globes and orbital soft tissues within normal limits. Bony orbits intact. Sinuses: Paranasal sinuses are largely clear. Soft tissues: No visible soft tissue injury about the face. CT CERVICAL SPINE FINDINGS Alignment: Examination degraded by motion artifact. Straightening with smooth reversal of the normal cervical lordosis. No listhesis or subluxation. Skull base and vertebrae: Visualized skull base intact.  Normal C1-2 articulations are preserved in the dens is intact. Vertebral body height maintained. No acute fracture. Soft tissues and spinal canal: Visualized soft tissues demonstrate no acute finding. No abnormal prevertebral edema. Spinal canal within normal limits. Disc levels: Mild to moderate multilevel cervical spondylosis at C3-4 through C6-7. Upper chest: Visualized upper chest demonstrates no acute finding. Advanced emphysematous changes noted at the lung apices. Other: None. IMPRESSION: CT HEAD: 1. No acute intracranial abnormality. 2. Chiari 1 malformation. CT MAXILLOFACIAL: No acute maxillofacial injury.  No fracture. CT CERVICAL SPINE: 1. No acute traumatic injury within the cervical spine. 2. Mild to moderate multilevel cervical spondylosis at C3-4 through C6-7. 3.  Emphysema (ICD10-J43.9). Electronically Signed   By: Rise Mu M.D.   On: 05/29/2019 04:07   DG Chest Portable 1 View  Result Date:  05/29/2019 CLINICAL DATA:  Status post fall, history of COPD EXAM: PORTABLE CHEST 1 VIEW COMPARISON:  04/18/2019 FINDINGS: None The lungs are hyperinflated likely secondary to COPD. There is no focal consolidation. There is no pleural effusion or pneumothorax. The heart and mediastinal contours are unremarkable. There is no acute osseous abnormality. IMPRESSION: No active disease. Electronically Signed   By: Elige Ko   On: 05/29/2019 05:50   CT Maxillofacial Wo Contrast  Result Date: 05/29/2019 CLINICAL DATA:  Initial evaluation for acute near syncope, fall. Facial trauma. EXAM: CT HEAD WITHOUT CONTRAST CT MAXILLOFACIAL WITHOUT CONTRAST CT CERVICAL SPINE WITHOUT CONTRAST TECHNIQUE: Multidetector CT imaging of the head, cervical spine, and maxillofacial structures were performed using the standard protocol without intravenous contrast. Multiplanar CT image reconstructions of the cervical spine and maxillofacial structures were also generated. COMPARISON:  None. FINDINGS: CT HEAD FINDINGS Brain: Cerebral volume within normal limits. No acute intracranial hemorrhage. No acute large vessel territory infarct. No mass lesion or midline shift. No hydrocephalus or extra-axial fluid collection. Chiari 1 malformation noted. Vascular: No hyperdense vessel. Skull: Scalp soft tissues and calvarium within normal limits. Other: No mastoid effusion. CT MAXILLOFACIAL FINDINGS Osseous: Zygomatic arches intact. No acute maxillary fracture. Pterygoid plates intact. Nasal bones intact. Nasal septum intact. No acute mandibular fracture. No acute abnormality about the dentition. Orbits: Globes and orbital soft tissues within normal limits. Bony orbits intact. Sinuses: Paranasal sinuses are largely clear. Soft tissues: No visible soft tissue injury about the face. CT CERVICAL SPINE FINDINGS Alignment: Examination degraded by motion artifact. Straightening with smooth reversal of the normal cervical lordosis. No listhesis or  subluxation. Skull base and vertebrae: Visualized skull base intact. Normal C1-2 articulations are preserved in the dens is intact. Vertebral body height maintained. No acute fracture. Soft tissues and spinal canal: Visualized soft tissues demonstrate no acute finding. No abnormal prevertebral edema. Spinal canal within normal limits. Disc levels: Mild to moderate multilevel cervical spondylosis at C3-4 through C6-7. Upper chest: Visualized upper chest demonstrates no acute finding. Advanced emphysematous changes noted at the lung apices. Other: None. IMPRESSION: CT HEAD: 1. No acute intracranial abnormality. 2. Chiari 1 malformation. CT MAXILLOFACIAL: No acute maxillofacial injury.  No fracture. CT CERVICAL SPINE: 1. No acute traumatic injury within the cervical spine. 2. Mild to moderate multilevel cervical spondylosis at C3-4 through C6-7. 3.  Emphysema (ICD10-J43.9). Electronically Signed   By: Rise Mu M.D.   On: 05/29/2019 04:07     ____________________________________________   PROCEDURES  Procedure(s) performed: yes .Marland KitchenLaceration Repair  Date/Time: 05/29/2019 6:26 AM Performed by: Nita Sickle, MD Authorized by: Nita Sickle, MD   Consent:    Consent obtained:  Verbal  Consent given by:  Patient   Risks discussed:  Infection, pain, retained foreign body, poor cosmetic result and poor wound healing Laceration details:    Location:  Face   Face location:  L upper eyelid   Extent:  Superficial   Length (cm):  2 Repair type:    Repair type:  Simple Exploration:    Hemostasis achieved with:  Direct pressure   Wound exploration: entire depth of wound probed and visualized     Contaminated: no   Treatment:    Area cleansed with:  Saline   Amount of cleaning:  Standard   Irrigation solution:  Sterile saline   Visualized foreign bodies/material removed: no   Skin repair:    Repair method:  Tissue adhesive Approximation:    Approximation:   Close Post-procedure details:    Dressing:  Open (no dressing)   Patient tolerance of procedure:  Tolerated well, no immediate complications   Critical Care performed: yes  CRITICAL CARE Performed by: Nita Sickle  ?  Total critical care time: 40 min  Critical care time was exclusive of separately billable procedures and treating other patients.  Critical care was necessary to treat or prevent imminent or life-threatening deterioration.  Critical care was time spent personally by me on the following activities: development of treatment plan with patient and/or surrogate as well as nursing, discussions with consultants, evaluation of patient's response to treatment, examination of patient, obtaining history from patient or surrogate, ordering and performing treatments and interventions, ordering and review of laboratory studies, ordering and review of radiographic studies, pulse oximetry and re-evaluation of patient's condition.  ____________________________________________   INITIAL IMPRESSION / ASSESSMENT AND PLAN / ED COURSE   49 y.o. male with a history of COPD, noncompliant with therapies who presents for evaluation of syncopal event.  Patient initially arrived with a GCS of 15, alert and oriented.  After sleeping for several hours in the waiting room patient was brought to the room more somnolent and a little hard to arouse with a GCS of 10.  Prior similar history when patient sleeps without CPAP machine.  A VBG was done showing hypercapnic respiratory failure with a PCO2 of greater than 120 and a pH of 7.17.  Patient was placed on BiPAP.  After about 45 minutes on BiPAP his mental status started to improve.  Patient does not remember coming into the hospital but knows who he is and that he is in the hospital right now.  He CT head and cervical spine and face did not show any acute traumatic injuries.  His chest x-ray does not show any evidence of edema, pneumonia, or traumatic  injuries.  His laceration was very superficial and was repaired with Dermabond.  His tetanus was renewed.  His initial EKG did not show any dysrhythmias or acute ischemic changes.  His initial troponin was slightly elevated at 30. Repeat pending. Ddx vasovagal versus orthostasis versus respiratory failure from COPD versus cardiac syncope.  I have reviewed patient's previous medical records and PMH.    _____________________________________________ Please note:  Patient was evaluated in Emergency Department today for the symptoms described in the history of present illness. Patient was evaluated in the context of the global COVID-19 pandemic, which necessitated consideration that the patient might be at risk for infection with the SARS-CoV-2 virus that causes COVID-19. Institutional protocols and algorithms that pertain to the evaluation of patients at risk for COVID-19 are in a state of rapid change based on information released by regulatory bodies  including the CDC and federal and state organizations. These policies and algorithms were followed during the patient's care in the ED.  Some ED evaluations and interventions may be delayed as a result of limited staffing during the pandemic.   ____________________________________________   FINAL CLINICAL IMPRESSION(S) / ED DIAGNOSES   Final diagnoses:  Syncope, unspecified syncope type  Left eyelid laceration, initial encounter  Acute on chronic respiratory failure with hypoxia and hypercapnia (HCC)      NEW MEDICATIONS STARTED DURING THIS VISIT:  ED Discharge Orders    None       Note:  This document was prepared using Dragon voice recognition software and may include unintentional dictation errors.    Don Perking, [place], MD 05/29/19 [phone]

## 2019-05-29 NOTE — H&P (Addendum)
History and Physical    Chris OreHarold Sneeringer WRU:[id number] DOB: 1970-05-14 DOA: 05/29/2019  Referring MD/NP/PA:   PCP: [facility]   Patient coming from:  The patient is coming from home.  At baseline, pt is independent for most of ADL.        Chief Complaint: syncope and AMS  HPI: Chris Case is a 49 y.o. male with medical history significant of COPD on 3 L oxygen, on BiPAP, thrombocytopenia, Chiari malformation, who presents with syncope and AMS.  Patient has altered mental status, cannot give accurate medical history.  I called her sister, who is not living with patient, therefore she recommended me to call her roommate.  I called her roommate, who provided most of the medical history.  Per her roommate, patient has history of COPD, supposed to use of BiPAP, but patient is not using BiPAP consistently.  He has shortness breath, cough, but no chest pain.  No fever or chills.  Last night, patient passed out in bathroom, fell, and struck his eye on bathroom sink. Has has a small laceration above left eye which is repaired by EDP. Pt does not have nausea vomiting, diarrhea, abdominal pain, symptoms of UTI.  He moves all extremities.  No facial droop or slurred speech.  ED Course: pt was found to have troponin 30, 39, 35, WBC 8.7, negative Covid PCR, alcohol level less than 10, electrolytes renal function okay, temperature normal, soft blood pressure, tachycardia. Initial VBG with pH 7.17, CO2 >120, O2 173.  Patient is put on BiPAP.  The repeat ABG showed pH 7.36, CO2 114, O2 78. Chest x-ray negative.  CT of maxillofacial image is negative for fracture.  CT of C-spine is negative for bony fracture.  CT of head is negative for acute intracranial abnormalities, but showed known Chiari I malformation.  Patient is admitted to stepdown as inpatient.  Dr. Belia HemanKasa of ICU is consulted.   review of Systems: Could not be reviewed accurately due to altered mental status.  Allergy: No Known  Allergies  Past Medical History:  Diagnosis Date  . COPD (chronic obstructive pulmonary disease) (HCC)   . Emphysema lung (HCC)   . 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 APPENDECTOMY  late 1980's  . LAPAROTOMY N/A 12/06/2016   Procedure: EXPLORATORY LAPAROTOMY- SBO;  Surgeon: Lattie Hawooper, Richard E, MD;  Location: [facility] ORS;  Service: General;  Laterality: N/A;    Social History:  reports that he quit smoking about 3 years ago. His smoking use included cigarettes. He has a 60.00 pack-year smoking history. He has never used smokeless tobacco. He reports current drug use. Drug: Marijuana. He reports that he does not drink alcohol.  Family History:  Family History  Problem Relation Age of Onset  . COPD Mother      Prior to Admission medications   Medication Sig Start Date End Date Taking? Authorizing Provider  albuterol (PROVENTIL) (2.5 MG/3ML) 0.083% nebulizer solution Inhale 3 mLs into the lungs every 4 (four) hours as needed for wheezing or shortness of breath. 09/26/15  Yes Katharina CaperVaickute, Rima, MD  albuterol (VENTOLIN HFA) 108 (90 Base) MCG/ACT inhaler Inhale 2 puffs into the lungs every 4 (four) hours as needed for shortness of breath. 01/09/19  Yes [provider]  mometasone-formoterol (DULERA) 200-5 MCG/ACT AERO Inhale 2 puffs into the lungs 2 (two) times daily. 09/26/15  Yes Katharina CaperVaickute, Rima, MD  Tiotropium Bromide  Monohydrate (SPIRIVA RESPIMAT) 1.25 MCG/ACT AERS Inhale 1 puff into the lungs daily.    Yes [provider]    Physical Exam: Vitals:   05/29/19 0830 05/29/19 0845 05/29/19 0900 05/29/19 1023  BP: 105/79  106/77   Pulse: 86 81 82   Resp: Temp:      SpO2: 99% 98% 95% 98%  Weight:       General: Not in acute distress HEENT:       Eyes: PERRL, EOMI, no scleral icterus.       ENT: No discharge from the ears and nose       Neck: No JVD, no bruit, no mass felt. Heme: No neck  lymph node enlargement. Cardiac: S1/S2, RRR, No murmurs, No gallops or rubs. Respiratory: Severely decreased air movement bilaterally. GI: Soft, nondistended, nontender, no organomegaly, BS present. GU: No hematuria Ext: No pitting leg edema bilaterally. 2+DP/PT pulse bilaterally. Musculoskeletal: No joint deformities, No joint redness or warmth, no limitation of ROM in spin. Skin: No rashes. Has a small skin laceration above left eye Neuro: confused, knows his own name and knows that he is in hospital, but not orientated to time, cranial nerves II-XII grossly intact, moves all extremitie. Psych: Patient is not psychotic, no suicidal or hemocidal ideation.  Labs on Admission: I have personally reviewed following labs and imaging studies  CBC: Recent Labs  Lab 05/29/19 0118  WBC 8.7  HGB 10.7*  HCT 35.9*  MCV 105.3*  PLT 122*   Basic Metabolic Panel: Recent Labs  Lab 05/29/19 0357  NA 145  K 4.7  CL 84*  CO2 50*  GLUCOSE 105*  BUN 20  CREATININE 0.91  CALCIUM 8.9   GFR: Estimated Creatinine Clearance: 78.6 mL/min (by C-G formula based on SCr of 0.91 mg/dL). Liver Function Tests: Recent Labs  Lab 05/29/19 0357  AST 29  ALT 20  ALKPHOS 48  BILITOT 0.8  PROT 7.3  ALBUMIN 4.2   No results for input(s): LIPASE, AMYLASE in the last 168 hours. No results for input(s): AMMONIA in the last 168 hours. Coagulation Profile: No results for input(s): INR, PROTIME in the last 168 hours. Cardiac Enzymes: No results for input(s): CKTOTAL, CKMB, CKMBINDEX, TROPONINI in the last 168 hours. BNP (last 3 results) No results for input(s): PROBNP in the last 8760 hours. HbA1C: No results for input(s): HGBA1C in the last 72 hours. CBG: Recent Labs  Lab 05/29/19 0121  GLUCAP 138*   Lipid Profile: No results for input(s): CHOL, HDL, LDLCALC, TRIG, CHOLHDL, LDLDIRECT in the last 72 hours. Thyroid Function Tests: No results for input(s): TSH, T4TOTAL, FREET4, T3FREE, THYROIDAB  in the last 72 hours. Anemia Panel: No results for input(s): VITAMINB12, FOLATE, FERRITIN, TIBC, IRON, RETICCTPCT in the last 72 hours. Urine analysis:    Component Value Date/Time   COLORURINE YELLOW (A) 04/16/2019 2051   APPEARANCEUR HAZY (A) 04/16/2019 2051   LABSPEC 1.021 04/16/2019 2051   PHURINE 5.0 04/16/2019 2051   GLUCOSEU NEGATIVE 04/16/2019 2051   HGBUR NEGATIVE 04/16/2019 2051   BILIRUBINUR NEGATIVE 04/16/2019 2051   KETONESUR 5 (A) 04/16/2019 2051   PROTEINUR 100 (A) 04/16/2019 2051   NITRITE NEGATIVE 04/16/2019 2051   LEUKOCYTESUR NEGATIVE 04/16/2019 2051   Sepsis Labs: (procalcitonin:4,lacticidven:4) ) Recent Results (from the past 240 hour(s))  Respiratory Panel by RT PCR (Flu A&B, Covid) - Nasopharyngeal Swab     Status: None   Collection Time: 05/29/19  5:32 AM   Specimen: Nasopharyngeal  Swab  Result Value Ref Range Status   SARS Coronavirus 2 by RT PCR NEGATIVE NEGATIVE Final    Comment: (NOTE) SARS-CoV-2 target nucleic acids are NOT DETECTED. The SARS-CoV-2 RNA is generally detectable in upper respiratoy specimens during the acute phase of infection. The lowest concentration of SARS-CoV-2 viral copies this assay can detect is 131 copies/mL. A negative result does not preclude SARS-Cov-2 infection and should not be used as the sole basis for treatment or other patient management decisions. A negative result may occur with  improper specimen collection/handling, submission of specimen other than nasopharyngeal swab, presence of viral mutation(s) within the areas targeted by this assay, and inadequate number of viral copies (<131 copies/mL). A negative result must be combined with clinical observations, patient history, and epidemiological information. The expected result is Negative. Fact Sheet for Patients:  https://www.moore.com/ Fact Sheet for Healthcare Providers:  https://www.young.biz/ This test is  not yet ap proved or cleared by the [place] FDA and  has been authorized for detection and/or diagnosis of SARS-CoV-2 by FDA under an Emergency Use Authorization (EUA). This EUA will remain  in effect (meaning this test can be used) for the duration of the COVID-19 declaration under Section 564(b)(1) of the Act, 21 U.S.C. section 360bbb-3(b)(1), unless the authorization is terminated or revoked sooner.    Influenza A by PCR NEGATIVE NEGATIVE Final   Influenza B by PCR NEGATIVE NEGATIVE Final    Comment: (NOTE) The Xpert Xpress SARS-CoV-2/FLU/RSV assay is intended as an aid in  the diagnosis of influenza from Nasopharyngeal swab specimens and  should not be used as a sole basis for treatment. Nasal washings and  aspirates are unacceptable for Xpert Xpress SARS-CoV-2/FLU/RSV  testing. Fact Sheet for Patients: https://www.moore.com/ Fact Sheet for Healthcare Providers: https://www.young.biz/ This test is not yet approved or cleared by the [place] FDA and  has been authorized for detection and/or diagnosis of SARS-CoV-2 by  FDA under an Emergency Use Authorization (EUA). This EUA will remain  in effect (meaning this test can be used) for the duration of the  Covid-19 declaration under Section 564(b)(1) of the Act, 21  U.S.C. section 360bbb-3(b)(1), unless the authorization is  terminated or revoked. Performed at [facility], [address] [place], [place] [address]      Radiological Exams on Admission: CT Head Wo Contrast  Result Date: 05/29/2019 CLINICAL DATA:  Initial evaluation for acute near syncope, fall. Facial trauma. EXAM: CT HEAD WITHOUT CONTRAST CT MAXILLOFACIAL WITHOUT CONTRAST CT CERVICAL SPINE WITHOUT CONTRAST TECHNIQUE: Multidetector CT imaging of the head, cervical spine, and maxillofacial structures were performed using the standard protocol without intravenous contrast. Multiplanar CT image reconstructions  of the cervical spine and maxillofacial structures were also generated. COMPARISON:  None. FINDINGS: CT HEAD FINDINGS Brain: Cerebral volume within normal limits. No acute intracranial hemorrhage. No acute large vessel territory infarct. No mass lesion or midline shift. No hydrocephalus or extra-axial fluid collection. Chiari 1 malformation noted. Vascular: No hyperdense vessel. Skull: Scalp soft tissues and calvarium within normal limits. Other: No mastoid effusion. CT MAXILLOFACIAL FINDINGS Osseous: Zygomatic arches intact. No acute maxillary fracture. Pterygoid plates intact. Nasal bones intact. Nasal septum intact. No acute mandibular fracture. No acute abnormality about the dentition. Orbits: Globes and orbital soft tissues within normal limits. Bony orbits intact. Sinuses: Paranasal sinuses are largely clear. Soft tissues: No visible soft tissue injury about the face. CT CERVICAL SPINE FINDINGS Alignment: Examination degraded by motion artifact. Straightening with smooth reversal of the normal cervical  lordosis. No listhesis or subluxation. Skull base and vertebrae: Visualized skull base intact. Normal C1-2 articulations are preserved in the dens is intact. Vertebral body height maintained. No acute fracture. Soft tissues and spinal canal: Visualized soft tissues demonstrate no acute finding. No abnormal prevertebral edema. Spinal canal within normal limits. Disc levels: Mild to moderate multilevel cervical spondylosis at C3-4 through C6-7. Upper chest: Visualized upper chest demonstrates no acute finding. Advanced emphysematous changes noted at the lung apices. Other: None. IMPRESSION: CT HEAD: 1. No acute intracranial abnormality. 2. Chiari 1 malformation. CT MAXILLOFACIAL: No acute maxillofacial injury.  No fracture. CT CERVICAL SPINE: 1. No acute traumatic injury within the cervical spine. 2. Mild to moderate multilevel cervical spondylosis at C3-4 through C6-7. 3.  Emphysema (ICD10-J43.9). Electronically  Signed   By: Jeannine Boga M.D.   On: 05/29/2019 04:07   CT ANGIO CHEST PE W OR WO CONTRAST  Result Date: 05/29/2019 CLINICAL DATA:  Syncope, facial laceration, no chest pain or shortness of breath EXAM: CT ANGIOGRAPHY CHEST WITH CONTRAST TECHNIQUE: Multidetector CT imaging of the chest was performed using the standard protocol during bolus administration of intravenous contrast. Multiplanar CT image reconstructions and MIPs were obtained to evaluate the vascular anatomy. CONTRAST:  42mL OMNIPAQUE IOHEXOL 350 MG/ML SOLN COMPARISON:  09/23/2013 FINDINGS: Cardiovascular: Cardiomegaly with biventricular dilatation. No pericardial effusion. Dilated central pulmonary arteries. Satisfactory opacification of pulmonary arteries noted, and there is no evidence of pulmonary emboli. Adequate contrast opacification of the thoracic aorta with no evidence of dissection, aneurysm, or stenosis. There is classic 3-vessel brachiocephalic arch anatomy without proximal stenosis. Minimal aortic plaque in the arch. Mediastinum/Nodes: No hilar or mediastinal adenopathy. Lungs/Pleura: Pulmonary emphysema (ICD10-J43.9). No pleural effusion. No pneumothorax. Upper Abdomen: No acute findings. Musculoskeletal: No chest wall abnormality. No acute or significant osseous findings. Review of the MIP images confirms the above findings. IMPRESSION: 1. Negative for acute PE or thoracic aortic dissection. 2. Dilated central pulmonary arteries suggesting pulmonary arterial hypertension. Emphysema (ICD10-J43.9). Electronically Signed   By: Lucrezia Europe M.D.   On: 05/29/2019 10:32   CT Cervical Spine Wo Contrast  Result Date: 05/29/2019 CLINICAL DATA:  Initial evaluation for acute near syncope, fall. Facial trauma. EXAM: CT HEAD WITHOUT CONTRAST CT MAXILLOFACIAL WITHOUT CONTRAST CT CERVICAL SPINE WITHOUT CONTRAST TECHNIQUE: Multidetector CT imaging of the head, cervical spine, and maxillofacial structures were performed using the standard  protocol without intravenous contrast. Multiplanar CT image reconstructions of the cervical spine and maxillofacial structures were also generated. COMPARISON:  None. FINDINGS: CT HEAD FINDINGS Brain: Cerebral volume within normal limits. No acute intracranial hemorrhage. No acute large vessel territory infarct. No mass lesion or midline shift. No hydrocephalus or extra-axial fluid collection. Chiari 1 malformation noted. Vascular: No hyperdense vessel. Skull: Scalp soft tissues and calvarium within normal limits. Other: No mastoid effusion. CT MAXILLOFACIAL FINDINGS Osseous: Zygomatic arches intact. No acute maxillary fracture. Pterygoid plates intact. Nasal bones intact. Nasal septum intact. No acute mandibular fracture. No acute abnormality about the dentition. Orbits: Globes and orbital soft tissues within normal limits. Bony orbits intact. Sinuses: Paranasal sinuses are largely clear. Soft tissues: No visible soft tissue injury about the face. CT CERVICAL SPINE FINDINGS Alignment: Examination degraded by motion artifact. Straightening with smooth reversal of the normal cervical lordosis. No listhesis or subluxation. Skull base and vertebrae: Visualized skull base intact. Normal C1-2 articulations are preserved in the dens is intact. Vertebral body height maintained. No acute fracture. Soft tissues and spinal canal: Visualized soft tissues demonstrate no acute  finding. No abnormal prevertebral edema. Spinal canal within normal limits. Disc levels: Mild to moderate multilevel cervical spondylosis at C3-4 through C6-7. Upper chest: Visualized upper chest demonstrates no acute finding. Advanced emphysematous changes noted at the lung apices. Other: None. IMPRESSION: CT HEAD: 1. No acute intracranial abnormality. 2. Chiari 1 malformation. CT MAXILLOFACIAL: No acute maxillofacial injury.  No fracture. CT CERVICAL SPINE: 1. No acute traumatic injury within the cervical spine. 2. Mild to moderate multilevel cervical  spondylosis at C3-4 through C6-7. 3.  Emphysema (ICD10-J43.9). Electronically Signed   By: Rise Mu M.D.   On: 05/29/2019 04:07   DG Chest Portable 1 View  Result Date: 05/29/2019 CLINICAL DATA:  Status post fall, history of COPD EXAM: PORTABLE CHEST 1 VIEW COMPARISON:  04/18/2019 FINDINGS: None The lungs are hyperinflated likely secondary to COPD. There is no focal consolidation. There is no pleural effusion or pneumothorax. The heart and mediastinal contours are unremarkable. There is no acute osseous abnormality. IMPRESSION: No active disease. Electronically Signed   By: Elige Ko   On: 05/29/2019 05:50   CT Maxillofacial Wo Contrast  Result Date: 05/29/2019 CLINICAL DATA:  Initial evaluation for acute near syncope, fall. Facial trauma. EXAM: CT HEAD WITHOUT CONTRAST CT MAXILLOFACIAL WITHOUT CONTRAST CT CERVICAL SPINE WITHOUT CONTRAST TECHNIQUE: Multidetector CT imaging of the head, cervical spine, and maxillofacial structures were performed using the standard protocol without intravenous contrast. Multiplanar CT image reconstructions of the cervical spine and maxillofacial structures were also generated. COMPARISON:  None. FINDINGS: CT HEAD FINDINGS Brain: Cerebral volume within normal limits. No acute intracranial hemorrhage. No acute large vessel territory infarct. No mass lesion or midline shift. No hydrocephalus or extra-axial fluid collection. Chiari 1 malformation noted. Vascular: No hyperdense vessel. Skull: Scalp soft tissues and calvarium within normal limits. Other: No mastoid effusion. CT MAXILLOFACIAL FINDINGS Osseous: Zygomatic arches intact. No acute maxillary fracture. Pterygoid plates intact. Nasal bones intact. Nasal septum intact. No acute mandibular fracture. No acute abnormality about the dentition. Orbits: Globes and orbital soft tissues within normal limits. Bony orbits intact. Sinuses: Paranasal sinuses are largely clear. Soft tissues: No visible soft tissue injury  about the face. CT CERVICAL SPINE FINDINGS Alignment: Examination degraded by motion artifact. Straightening with smooth reversal of the normal cervical lordosis. No listhesis or subluxation. Skull base and vertebrae: Visualized skull base intact. Normal C1-2 articulations are preserved in the dens is intact. Vertebral body height maintained. No acute fracture. Soft tissues and spinal canal: Visualized soft tissues demonstrate no acute finding. No abnormal prevertebral edema. Spinal canal within normal limits. Disc levels: Mild to moderate multilevel cervical spondylosis at C3-4 through C6-7. Upper chest: Visualized upper chest demonstrates no acute finding. Advanced emphysematous changes noted at the lung apices. Other: None. IMPRESSION: CT HEAD: 1. No acute intracranial abnormality. 2. Chiari 1 malformation. CT MAXILLOFACIAL: No acute maxillofacial injury.  No fracture. CT CERVICAL SPINE: 1. No acute traumatic injury within the cervical spine. 2. Mild to moderate multilevel cervical spondylosis at C3-4 through C6-7. 3.  Emphysema (ICD10-J43.9). Electronically Signed   By: Rise Mu M.D.   On: 05/29/2019 04:07     EKG: Independently reviewed.  Sinus rhythm, QTC 374, LAE, ST depression in V3-V4.    Assessment/Plan Principal Problem:   Acute on chronic respiratory failure with hypoxia and hypercapnia (HCC) Active Problems:   COPD exacerbation (HCC)   Thrombocytopenia (HCC)   Elevated troponin   Syncope   Acute on chronic respiratory failure with hypoxia and hypercapnia due to COPD exacerbation:  Pt is supposed to use of BiPAP, but patient is not using BiPAP consistently. He has severely decreased air movement bilaterally on auscultation. Dr. Belia Heman of ICU is consulted. -will admit to SDU as inpt -On BiPAP -Bronchodilators -Solu-Medrol 40 mg IV tid -Mucinex for cough  -Incentive spirometry -Nasal cannula oxygen as needed to maintain O2 saturation 93% or greater  Syncope: CT-head is  negative for acute intracranial abnormalities.  Most likely due to hypoxia and hypercapnia. CTA is negative for PE. -Frequent neuro check -Follow-up 2D echo  Thrombocytopenia [facility]): this is a chronic issue. Platelet 122. -f/u by CBC  Elevated troponin: trop 30 -->39 -->35. Likely due to demand ischemia.  No chest pain. -Check A1c, FLP -Trend troponin -Repeat EKG in the morning -Follow-up 2D echo   Inpatient status:  # Patient requires inpatient status due to high intensity of service, high risk for further deterioration and high frequency of surveillance required.  I certify that at the point of admission it is my clinical judgment that the patient will require inpatient hospital care spanning beyond 2 midnights from the point of admission.  . This patient has multiple chronic comorbidities including COPD on 3 L oxygen, on BiPAP, thrombocytopenia, Chiari malformation . Now patient has presenting with acute on chronic respiratory failure with hypoxia and hypercapnia , COPD exacerbation, syncope, elevated troponin . The worrisome physical exam findings include AMS.  Severely decreased air movement on auscultation . The initial radiographic and laboratory data are worrisome because of elevated troponin, hypercapnia on ABG . Current medical needs: please see my assessment and plan . Predictability of an adverse outcome (risk): Patient has multiple comorbidities as listed above. Now presents with acute on chronic respiratory failure with hypoxia and hypercapnia , COPD exacerbation, syncope, elevated troponin. [name] presentation is highly complicated.  Patient is at high risk of deteriorating.  Will need to be treated in hospital for at least 2 days.     DVT ppx: SQ Lovenox Code Status: Full code Family Communication:   Yes, patient's sister by phone Disposition Plan:  Anticipate discharge back to previous home environment Consults called:  Dr. Belia Heman of ICU Admission status:  SDU/inpation       Date of Service 05/29/2019    Lorretta Harp [facility]   If 7PM-7AM, please contact night-coverage [email or web address] 05/29/2019, 11:06 AM

## 2019-05-30 ENCOUNTER — Inpatient Hospital Stay (HOSPITAL_COMMUNITY)
Admit: 2019-05-30 | Discharge: 2019-05-30 | Disposition: A | Discharge status: Home / Self Care | Payer: Medicaid Other | Attending: Internal Medicine | Admitting: Internal Medicine

## 2019-05-30 DIAGNOSIS — R55 Syncope and collapse: Secondary | ICD-10-CM

## 2019-05-30 DIAGNOSIS — J449 Chronic obstructive pulmonary disease, unspecified: Secondary | ICD-10-CM

## 2019-05-30 LAB — BASIC METABOLIC PANEL
Anion gap: 6 (ref 5–15)
BUN: 18 mg/dL (ref 6–20)
CO2: 48 mmol/L — ABNORMAL HIGH (ref 22–32)
Calcium: 8.5 mg/dL — ABNORMAL LOW (ref 8.9–10.3)
Chloride: 85 mmol/L — ABNORMAL LOW (ref 98–111)
Creatinine, Ser: 0.46 mg/dL — ABNORMAL LOW (ref 0.61–1.24)
GFR calc Af Amer: >60 mL/min (ref >60)
GFR calc non Af Amer: >60 mL/min (ref >60)
Glucose, Bld: 92 mg/dL (ref 70–99)
Potassium: 4.2 mmol/L (ref 3.5–5.1)
Sodium: 139 mmol/L (ref 135–145)

## 2019-05-30 LAB — LIPID PANEL
Cholesterol: 158 mg/dL (ref 0–200)
HDL: 57 mg/dL (ref >40)
LDL Cholesterol: 89 mg/dL (ref 0–99)
Total CHOL/HDL Ratio: 2.8 RATIO
Triglycerides: 62 mg/dL (ref <150)
VLDL: 12 mg/dL (ref 0–40)

## 2019-05-30 LAB — CBC
HCT: 31.5 % — ABNORMAL LOW (ref 39.0–52.0)
Hemoglobin: 9.6 g/dL — ABNORMAL LOW (ref 13.0–17.0)
MCH: 30.9 pg (ref 26.0–34.0)
MCHC: 30.5 g/dL (ref 30.0–36.0)
MCV: 101.3 fL — ABNORMAL HIGH (ref 80.0–100.0)
Platelets: 101 10*3/uL — ABNORMAL LOW (ref 150–400)
RBC: 3.11 MIL/uL — ABNORMAL LOW (ref 4.22–5.81)
RDW: 12.4 % (ref 11.5–15.5)
WBC: 6.7 10*3/uL (ref 4.0–10.5)
nRBC: 0 % (ref 0.0–0.2)

## 2019-05-30 LAB — ECHOCARDIOGRAM COMPLETE
Height: 66 in
Weight: 1947.1 oz

## 2019-05-30 LAB — HEMOGLOBIN A1C
Hgb A1c MFr Bld: 4.7 % — ABNORMAL LOW (ref 4.8–5.6)
Mean Plasma Glucose: 88.19 mg/dL

## 2019-05-30 LAB — GLUCOSE, CAPILLARY: Glucose-Capillary: 118 mg/dL — ABNORMAL HIGH (ref 70–99)

## 2019-05-30 MED ORDER — ORAL CARE MOUTH RINSE
15.0000 mL | Freq: Two times a day (BID) | OROMUCOSAL | Status: DC
  Administered 2019-05-31 – 2019-06-03 (×6): 15 mL via OROMUCOSAL

## 2019-05-30 NOTE — Progress Notes (Signed)
Visited patient who was awake and had just finished breakfast and watching television. Patient stated he was glad to be alive today. He also stated how good he was feeling today. We chatted briefly and I prayed for patient.

## 2019-05-30 NOTE — Progress Notes (Signed)
Patient ID: Jagdeep Ancheta, male   DOB: 1971-01-30, 49 y.o.   MRN: [id number] [facility] PROGRESS NOTE  Cutler Sunday FKC:[id number] DOB: 17-Oct-1970 DOA: 05/29/2019 PCP: [facility]  HPI/Subjective: Patient seen this morning while on BiPAP.  He was answering questions appropriately.  No complaints of chest pain.  He did not complain of any shortness of breath or cough or wheezing.  He was asking for some ice cream.  Patient states that he has been wearing his BiPAP at home.  Objective: Vitals:   05/30/19 0800 05/30/19 1130  BP: 129/78   Pulse: (!) 108 88  Resp: (!) 25 19  Temp: 98.3 F (36.8 C)   SpO2: 96% 98%    Intake/Output Summary (Last 24 hours) at 05/30/2019 1233 Last data filed at 05/30/2019 0917 Gross per 24 hour  Intake 240 ml  Output 1250 ml  Net -1010 ml   Filed Weights   05/29/19 0111 05/29/19 1547  Weight: 56.6 kg 55.2 kg    ROS: Review of Systems  Constitutional: Negative for fever.  Eyes: Negative for blurred vision.  Respiratory: Negative for cough and shortness of breath.   Cardiovascular: Negative for chest pain.  Gastrointestinal: Negative for abdominal pain, nausea and vomiting.  Genitourinary: Negative for dysuria.  Musculoskeletal: Negative for joint pain.  Neurological: Negative for headaches.   Exam: Physical Exam  Constitutional: He is oriented to person, place, and time.  HENT:  Nose: No mucosal edema.  Mouth/Throat: No oropharyngeal exudate or posterior oropharyngeal edema.  Eyes: Conjunctivae and lids are normal.  Cardiovascular: S1 normal and S2 normal. Tachycardia present. Exam reveals no gallop.  No murmur heard. Respiratory: No respiratory distress. He has decreased breath sounds in the right lower field and the left lower field. He has no wheezes. He has rhonchi in the right lower field and the left lower field. He has no rales.  GI: Soft. Bowel sounds are normal. There is no abdominal tenderness.   Musculoskeletal:     Right ankle: No swelling.     Left ankle: No swelling.  Lymphadenopathy:    He has no cervical adenopathy.  Neurological: He is alert and oriented to person, place, and time. No cranial nerve deficit.  Skin: Skin is warm. No rash noted. Nails show no clubbing.  Psychiatric: He has a normal mood and affect.      Data Reviewed: Basic Metabolic Panel: Recent Labs  Lab 05/29/19 0357 05/30/19 0405  NA 145 139  K 4.7 4.2  CL 84* 85*  CO2 50* 48*  GLUCOSE 105* 92  BUN 20 18  CREATININE 0.91 0.46*  CALCIUM 8.9 8.5*   Liver Function Tests: Recent Labs  Lab 05/29/19 0357  AST 29  ALT 20  ALKPHOS 48  BILITOT 0.8  PROT 7.3  ALBUMIN 4.2   CBC: Recent Labs  Lab 05/29/19 0118 05/30/19 0405  WBC 8.7 6.7  HGB 10.7* 9.6*  HCT 35.9* 31.5*  MCV 105.3* 101.3*  PLT 122* 101*   BNP (last 3 results) Recent Labs    04/16/19 1842  BNP 86.0     CBG: Recent Labs  Lab 05/29/19 0121 05/29/19 1549 05/30/19 0727  GLUCAP 138* 108* 118*    Recent Results (from the past 240 hour(s))  Respiratory Panel by RT PCR (Flu A&B, Covid) - Nasopharyngeal Swab     Status: None   Collection Time: 05/29/19  5:32 AM   Specimen: Nasopharyngeal Swab  Result Value Ref Range Status   SARS Coronavirus 2  by RT PCR NEGATIVE NEGATIVE Final    Comment: (NOTE) SARS-CoV-2 target nucleic acids are NOT DETECTED. The SARS-CoV-2 RNA is generally detectable in upper respiratoy specimens during the acute phase of infection. The lowest concentration of SARS-CoV-2 viral copies this assay can detect is 131 copies/mL. A negative result does not preclude SARS-Cov-2 infection and should not be used as the sole basis for treatment or other patient management decisions. A negative result may occur with  improper specimen collection/handling, submission of specimen other than nasopharyngeal swab, presence of viral mutation(s) within the areas targeted by this assay, and inadequate  number of viral copies (<131 copies/mL). A negative result must be combined with clinical observations, patient history, and epidemiological information. The expected result is Negative. Fact Sheet for Patients:  https://www.moore.com/ Fact Sheet for Healthcare Providers:  https://www.young.biz/ This test is not yet ap proved or cleared by the [place] FDA and  has been authorized for detection and/or diagnosis of SARS-CoV-2 by FDA under an Emergency Use Authorization (EUA). This EUA will remain  in effect (meaning this test can be used) for the duration of the COVID-19 declaration under Section 564(b)(1) of the Act, 21 U.S.C. section 360bbb-3(b)(1), unless the authorization is terminated or revoked sooner.    Influenza A by PCR NEGATIVE NEGATIVE Final   Influenza B by PCR NEGATIVE NEGATIVE Final    Comment: (NOTE) The Xpert Xpress SARS-CoV-2/FLU/RSV assay is intended as an aid in  the diagnosis of influenza from Nasopharyngeal swab specimens and  should not be used as a sole basis for treatment. Nasal washings and  aspirates are unacceptable for Xpert Xpress SARS-CoV-2/FLU/RSV  testing. Fact Sheet for Patients: https://www.moore.com/ Fact Sheet for Healthcare Providers: https://www.young.biz/ This test is not yet approved or cleared by the [place] FDA and  has been authorized for detection and/or diagnosis of SARS-CoV-2 by  FDA under an Emergency Use Authorization (EUA). This EUA will remain  in effect (meaning this test can be used) for the duration of the  Covid-19 declaration under Section 564(b)(1) of the Act, 21  U.S.C. section 360bbb-3(b)(1), unless the authorization is  terminated or revoked. Performed at [facility], [address] [place], [place] [address]   MRSA PCR Screening     Status: None   Collection Time: 05/29/19  3:52 PM   Specimen: Nasopharyngeal  Result  Value Ref Range Status   MRSA by PCR NEGATIVE NEGATIVE Final    Comment:        The GeneXpert MRSA Assay (FDA approved for NASAL specimens only), is one component of a comprehensive MRSA colonization surveillance program. It is not intended to diagnose MRSA infection nor to guide or monitor treatment for MRSA infections. Performed at [facility], [address], [place], [place] [address]      Studies: CT Head Wo Contrast  Result Date: 05/29/2019 CLINICAL DATA:  Initial evaluation for acute near syncope, fall. Facial trauma. EXAM: CT HEAD WITHOUT CONTRAST CT MAXILLOFACIAL WITHOUT CONTRAST CT CERVICAL SPINE WITHOUT CONTRAST TECHNIQUE: Multidetector CT imaging of the head, cervical spine, and maxillofacial structures were performed using the standard protocol without intravenous contrast. Multiplanar CT image reconstructions of the cervical spine and maxillofacial structures were also generated. COMPARISON:  None. FINDINGS: CT HEAD FINDINGS Brain: Cerebral volume within normal limits. No acute intracranial hemorrhage. No acute large vessel territory infarct. No mass lesion or midline shift. No hydrocephalus or extra-axial fluid collection. Chiari 1 malformation noted. Vascular: No hyperdense vessel. Skull: Scalp soft tissues and calvarium within  normal limits. Other: No mastoid effusion. CT MAXILLOFACIAL FINDINGS Osseous: Zygomatic arches intact. No acute maxillary fracture. Pterygoid plates intact. Nasal bones intact. Nasal septum intact. No acute mandibular fracture. No acute abnormality about the dentition. Orbits: Globes and orbital soft tissues within normal limits. Bony orbits intact. Sinuses: Paranasal sinuses are largely clear. Soft tissues: No visible soft tissue injury about the face. CT CERVICAL SPINE FINDINGS Alignment: Examination degraded by motion artifact. Straightening with smooth reversal of the normal cervical lordosis. No listhesis or subluxation. Skull base and  vertebrae: Visualized skull base intact. Normal C1-2 articulations are preserved in the dens is intact. Vertebral body height maintained. No acute fracture. Soft tissues and spinal canal: Visualized soft tissues demonstrate no acute finding. No abnormal prevertebral edema. Spinal canal within normal limits. Disc levels: Mild to moderate multilevel cervical spondylosis at C3-4 through C6-7. Upper chest: Visualized upper chest demonstrates no acute finding. Advanced emphysematous changes noted at the lung apices. Other: None. IMPRESSION: CT HEAD: 1. No acute intracranial abnormality. 2. Chiari 1 malformation. CT MAXILLOFACIAL: No acute maxillofacial injury.  No fracture. CT CERVICAL SPINE: 1. No acute traumatic injury within the cervical spine. 2. Mild to moderate multilevel cervical spondylosis at C3-4 through C6-7. 3.  Emphysema (ICD10-J43.9). Electronically Signed   By: Jeannine Boga M.D.   On: 05/29/2019 04:07   CT ANGIO CHEST PE W OR WO CONTRAST  Result Date: 05/29/2019 CLINICAL DATA:  Syncope, facial laceration, no chest pain or shortness of breath EXAM: CT ANGIOGRAPHY CHEST WITH CONTRAST TECHNIQUE: Multidetector CT imaging of the chest was performed using the standard protocol during bolus administration of intravenous contrast. Multiplanar CT image reconstructions and MIPs were obtained to evaluate the vascular anatomy. CONTRAST:  47mL OMNIPAQUE IOHEXOL 350 MG/ML SOLN COMPARISON:  09/23/2013 FINDINGS: Cardiovascular: Cardiomegaly with biventricular dilatation. No pericardial effusion. Dilated central pulmonary arteries. Satisfactory opacification of pulmonary arteries noted, and there is no evidence of pulmonary emboli. Adequate contrast opacification of the thoracic aorta with no evidence of dissection, aneurysm, or stenosis. There is classic 3-vessel brachiocephalic arch anatomy without proximal stenosis. Minimal aortic plaque in the arch. Mediastinum/Nodes: No hilar or mediastinal adenopathy.  Lungs/Pleura: Pulmonary emphysema (ICD10-J43.9). No pleural effusion. No pneumothorax. Upper Abdomen: No acute findings. Musculoskeletal: No chest wall abnormality. No acute or significant osseous findings. Review of the MIP images confirms the above findings. IMPRESSION: 1. Negative for acute PE or thoracic aortic dissection. 2. Dilated central pulmonary arteries suggesting pulmonary arterial hypertension. Emphysema (ICD10-J43.9). Electronically Signed   By: Lucrezia Europe M.D.   On: 05/29/2019 10:32   CT Cervical Spine Wo Contrast  Result Date: 05/29/2019 CLINICAL DATA:  Initial evaluation for acute near syncope, fall. Facial trauma. EXAM: CT HEAD WITHOUT CONTRAST CT MAXILLOFACIAL WITHOUT CONTRAST CT CERVICAL SPINE WITHOUT CONTRAST TECHNIQUE: Multidetector CT imaging of the head, cervical spine, and maxillofacial structures were performed using the standard protocol without intravenous contrast. Multiplanar CT image reconstructions of the cervical spine and maxillofacial structures were also generated. COMPARISON:  None. FINDINGS: CT HEAD FINDINGS Brain: Cerebral volume within normal limits. No acute intracranial hemorrhage. No acute large vessel territory infarct. No mass lesion or midline shift. No hydrocephalus or extra-axial fluid collection. Chiari 1 malformation noted. Vascular: No hyperdense vessel. Skull: Scalp soft tissues and calvarium within normal limits. Other: No mastoid effusion. CT MAXILLOFACIAL FINDINGS Osseous: Zygomatic arches intact. No acute maxillary fracture. Pterygoid plates intact. Nasal bones intact. Nasal septum intact. No acute mandibular fracture. No acute abnormality about the dentition. Orbits: Globes and orbital soft  tissues within normal limits. Bony orbits intact. Sinuses: Paranasal sinuses are largely clear. Soft tissues: No visible soft tissue injury about the face. CT CERVICAL SPINE FINDINGS Alignment: Examination degraded by motion artifact. Straightening with smooth reversal  of the normal cervical lordosis. No listhesis or subluxation. Skull base and vertebrae: Visualized skull base intact. Normal C1-2 articulations are preserved in the dens is intact. Vertebral body height maintained. No acute fracture. Soft tissues and spinal canal: Visualized soft tissues demonstrate no acute finding. No abnormal prevertebral edema. Spinal canal within normal limits. Disc levels: Mild to moderate multilevel cervical spondylosis at C3-4 through C6-7. Upper chest: Visualized upper chest demonstrates no acute finding. Advanced emphysematous changes noted at the lung apices. Other: None. IMPRESSION: CT HEAD: 1. No acute intracranial abnormality. 2. Chiari 1 malformation. CT MAXILLOFACIAL: No acute maxillofacial injury.  No fracture. CT CERVICAL SPINE: 1. No acute traumatic injury within the cervical spine. 2. Mild to moderate multilevel cervical spondylosis at C3-4 through C6-7. 3.  Emphysema (ICD10-J43.9). Electronically Signed   By: Rise Mu M.D.   On: 05/29/2019 04:07   DG Chest Portable 1 View  Result Date: 05/29/2019 CLINICAL DATA:  Status post fall, history of COPD EXAM: PORTABLE CHEST 1 VIEW COMPARISON:  04/18/2019 FINDINGS: None The lungs are hyperinflated likely secondary to COPD. There is no focal consolidation. There is no pleural effusion or pneumothorax. The heart and mediastinal contours are unremarkable. There is no acute osseous abnormality. IMPRESSION: No active disease. Electronically Signed   By: Elige Ko   On: 05/29/2019 05:50   CT Maxillofacial Wo Contrast  Result Date: 05/29/2019 CLINICAL DATA:  Initial evaluation for acute near syncope, fall. Facial trauma. EXAM: CT HEAD WITHOUT CONTRAST CT MAXILLOFACIAL WITHOUT CONTRAST CT CERVICAL SPINE WITHOUT CONTRAST TECHNIQUE: Multidetector CT imaging of the head, cervical spine, and maxillofacial structures were performed using the standard protocol without intravenous contrast. Multiplanar CT image reconstructions of  the cervical spine and maxillofacial structures were also generated. COMPARISON:  None. FINDINGS: CT HEAD FINDINGS Brain: Cerebral volume within normal limits. No acute intracranial hemorrhage. No acute large vessel territory infarct. No mass lesion or midline shift. No hydrocephalus or extra-axial fluid collection. Chiari 1 malformation noted. Vascular: No hyperdense vessel. Skull: Scalp soft tissues and calvarium within normal limits. Other: No mastoid effusion. CT MAXILLOFACIAL FINDINGS Osseous: Zygomatic arches intact. No acute maxillary fracture. Pterygoid plates intact. Nasal bones intact. Nasal septum intact. No acute mandibular fracture. No acute abnormality about the dentition. Orbits: Globes and orbital soft tissues within normal limits. Bony orbits intact. Sinuses: Paranasal sinuses are largely clear. Soft tissues: No visible soft tissue injury about the face. CT CERVICAL SPINE FINDINGS Alignment: Examination degraded by motion artifact. Straightening with smooth reversal of the normal cervical lordosis. No listhesis or subluxation. Skull base and vertebrae: Visualized skull base intact. Normal C1-2 articulations are preserved in the dens is intact. Vertebral body height maintained. No acute fracture. Soft tissues and spinal canal: Visualized soft tissues demonstrate no acute finding. No abnormal prevertebral edema. Spinal canal within normal limits. Disc levels: Mild to moderate multilevel cervical spondylosis at C3-4 through C6-7. Upper chest: Visualized upper chest demonstrates no acute finding. Advanced emphysematous changes noted at the lung apices. Other: None. IMPRESSION: CT HEAD: 1. No acute intracranial abnormality. 2. Chiari 1 malformation. CT MAXILLOFACIAL: No acute maxillofacial injury.  No fracture. CT CERVICAL SPINE: 1. No acute traumatic injury within the cervical spine. 2. Mild to moderate multilevel cervical spondylosis at C3-4 through C6-7. 3.  Emphysema (ICD10-J43.9).  Electronically  Signed   By: Rise MuBenjamin  McClintock M.D.   On: 05/29/2019 04:07    Scheduled Meds: . aspirin EC  81 mg Oral Daily  . Chlorhexidine Gluconate Cloth  6 each Topical Q0600  . dextromethorphan-guaiFENesin  1 tablet Oral BID  . enoxaparin (LOVENOX) injection  40 mg Subcutaneous Q24H  . [START ON 05/31/2019] mouth rinse  15 mL Mouth Rinse BID  . mometasone-formoterol  2 puff Inhalation BID  . tiotropium  18 mcg Inhalation Daily     Assessment/Plan:  1. Acute on chronic hypoxic and hypercapnic respiratory failure.  Last gas shows PCO2 still elevated.  Currently on BiPAP this morning. 2. Very severe COPD on oxygen 3 L at home.  Continue inhalers. 3. CT scan commenting on high pulmonary pressures.  Echocardiogram done this morning but results pending 4. Elevated troponin secondary to demand ischemia with acute on chronic hypoxic and hypercapnic respiratory failure 5. Chronic thrombocytopenia.  Reviewing old records hepatitis C negative.  Code Status:     Code Status Orders  (From admission, onward)         Start     Ordered   05/29/19 0910  Full code  Continuous     05/29/19 0910        Code Status History    Date Active Date Inactive Code Status Order ID Comments User Context   04/17/2019 0026 04/20/2019 1937 Full Code [id number]  Rometta EmeryGarba, Mohammad L, MD ED   01/24/2019 1932 01/27/2019 1831 Full Code [id number]  Anselm Junglingu, Ching T, DO ED   12/05/2016 0157 12/15/2016 1800 Full Code [id number]  Ancil Linseyavis, Jason Evan, MD ED   09/24/2015 1353 09/24/2015 1430 Full Code [id number]  Marguarite ArbourSparks, Jeffrey D, MD Inpatient   Advance Care Planning Activity     Family Communication: Patient asked me to call Misty StanleyLisa.  I left a message Disposition Plan: Patient will need to come off continuous BiPAP prior to making a plan.  Patient chronically wears 3 L of oxygen.  Consultants:  Critical care specialist  Time spent: 28 minutes  Euriah [employer]ack [employer]  [facility]

## 2019-05-30 NOTE — Progress Notes (Signed)
Patient ID: Chris Case, male   DOB: 01-Feb-1971, 49 y.o.   MRN: [id number]  Patient gave me permission to speak with Chris Case.  Chris Case called me back and stated that she he has not been using the BiPAP as directed.  She states he has gone downhill in the past 3 months with periods of high carbon dioxide and altered mental status.  We will get a palliative care consultation tomorrow Transitional care team to see if [facility] would be a more comfortable option  Dr Alford Highland

## 2019-05-31 ENCOUNTER — Encounter: Payer: Self-pay | Admitting: Internal Medicine

## 2019-05-31 DIAGNOSIS — Z7189 Other specified counseling: Secondary | ICD-10-CM

## 2019-05-31 DIAGNOSIS — I272 Pulmonary hypertension, unspecified: Secondary | ICD-10-CM

## 2019-05-31 DIAGNOSIS — Z515 Encounter for palliative care: Secondary | ICD-10-CM

## 2019-05-31 LAB — BLOOD GAS, ARTERIAL
Acid-Base Excess: 24.7 mmol/L — ABNORMAL HIGH (ref 0.0–2.0)
Bicarbonate: 58.8 mmol/L — ABNORMAL HIGH (ref 20.0–28.0)
FIO2: 0.32
O2 Saturation: 98.3 %
Patient temperature: 37
pCO2 arterial: >120 mmHg (ref 32.0–48.0)
pH, Arterial: 7.27 — ABNORMAL LOW (ref 7.350–7.450)
pO2, Arterial: 123 mmHg — ABNORMAL HIGH (ref 83.0–108.0)

## 2019-05-31 LAB — BLOOD GAS, VENOUS
Acid-Base Excess: 24.9 mmol/L — ABNORMAL HIGH (ref 0.0–2.0)
Bicarbonate: 57.7 mmol/L — ABNORMAL HIGH (ref 20.0–28.0)
Delivery systems: POSITIVE
FIO2: 0.4
O2 Saturation: 57.9 %
Patient temperature: 37
Patient temperature: 37
pCO2, Ven: >120 mmHg (ref 44.0–60.0)
pCO2, Ven: 112 mmHg (ref 44.0–60.0)
pH, Ven: 7.17 — CL (ref 7.250–7.430)
pH, Ven: 7.32 (ref 7.250–7.430)
pO2, Ven: 173 mmHg — ABNORMAL HIGH (ref 32.0–45.0)
pO2, Ven: 33 mmHg (ref 32.0–45.0)

## 2019-05-31 LAB — BASIC METABOLIC PANEL
Anion gap: 9 (ref 5–15)
BUN: 16 mg/dL (ref 6–20)
CO2: 47 mmol/L — ABNORMAL HIGH (ref 22–32)
Calcium: 8.3 mg/dL — ABNORMAL LOW (ref 8.9–10.3)
Chloride: 86 mmol/L — ABNORMAL LOW (ref 98–111)
Creatinine, Ser: 0.55 mg/dL — ABNORMAL LOW (ref 0.61–1.24)
GFR calc Af Amer: >60 mL/min (ref >60)
GFR calc non Af Amer: >60 mL/min (ref >60)
Glucose, Bld: 80 mg/dL (ref 70–99)
Potassium: 4.3 mmol/L (ref 3.5–5.1)
Sodium: 142 mmol/L (ref 135–145)

## 2019-05-31 LAB — CBC WITH DIFFERENTIAL/PLATELET
Abs Immature Granulocytes: 0.03 10*3/uL (ref 0.00–0.07)
Basophils Absolute: 0 10*3/uL (ref 0.0–0.1)
Basophils Relative: 0 %
Eosinophils Absolute: 0.2 10*3/uL (ref 0.0–0.5)
Eosinophils Relative: 2 %
HCT: 32.5 % — ABNORMAL LOW (ref 39.0–52.0)
Hemoglobin: 9.8 g/dL — ABNORMAL LOW (ref 13.0–17.0)
Immature Granulocytes: 1 %
Lymphocytes Relative: 20 %
Lymphs Abs: 1.3 10*3/uL (ref 0.7–4.0)
MCH: 31.3 pg (ref 26.0–34.0)
MCHC: 30.2 g/dL (ref 30.0–36.0)
MCV: 103.8 fL — ABNORMAL HIGH (ref 80.0–100.0)
Monocytes Absolute: 0.9 10*3/uL (ref 0.1–1.0)
Monocytes Relative: 14 %
Neutro Abs: 4.2 10*3/uL (ref 1.7–7.7)
Neutrophils Relative %: 63 %
Platelets: 92 10*3/uL — ABNORMAL LOW (ref 150–400)
RBC: 3.13 MIL/uL — ABNORMAL LOW (ref 4.22–5.81)
RDW: 12.3 % (ref 11.5–15.5)
WBC: 6.6 10*3/uL (ref 4.0–10.5)
nRBC: 0 % (ref 0.0–0.2)

## 2019-05-31 LAB — PHOSPHORUS: Phosphorus: 3.7 mg/dL (ref 2.5–4.6)

## 2019-05-31 LAB — MAGNESIUM: Magnesium: 1.9 mg/dL (ref 1.7–2.4)

## 2019-05-31 LAB — GLUCOSE, CAPILLARY: Glucose-Capillary: 127 mg/dL — ABNORMAL HIGH (ref 70–99)

## 2019-05-31 MED ORDER — PNEUMOCOCCAL VAC POLYVALENT 25 MCG/0.5ML IJ INJ
0.5000 mL | INJECTION | INTRAMUSCULAR | Status: AC
  Administered 2019-06-01: 12:00:00 0.5 mL via INTRAMUSCULAR
  Filled 2019-05-31: qty 0.5

## 2019-05-31 MED ORDER — INFLUENZA VAC A&B SA ADJ QUAD 0.5 ML IM PRSY
0.5000 mL | PREFILLED_SYRINGE | INTRAMUSCULAR | Status: AC
  Administered 2019-06-01: 10:00:00 0.5 mL via INTRAMUSCULAR
  Filled 2019-05-31: qty 0.5

## 2019-05-31 NOTE — Progress Notes (Signed)
Patient ID: Chris Case, male   DOB: 03-Nov-1970, 49 y.o.   MRN: [id number]  Spoke with [name] who called me back and I gave her an update about his elevated PCO2 seen on the ABG this morning and he was placed on BiPAP.  Awaiting palliative care consultation.  Dr Alford Highland

## 2019-05-31 NOTE — Progress Notes (Signed)
Pt more alert and asking to come off bipap stated he wants to eat. Placed on 2lpm [place], sats 90%, will continue to monitor.

## 2019-05-31 NOTE — Progress Notes (Signed)
[facility] visited pt. per [facility]'s referral; pt. in bed on Bipap; pt. able to speak though slightly difficult to understand b/c of mask.  Pt. appears to be coping effectively; he says his Co2 levels keep getting too high.  Pt. lives w/roommate w/many cats; pt. has one dog.  Pt. eager to have [facility] pray for him; [facility] prayed w/pt. for his recovery and for a sense of divine presence while in hospital.  [facility] will monitor pt.'s needs in the days to come.  No further needs @ this time.     05/31/19 1145  Clinical Encounter Type  Visited With Patient  Visit Type Follow-up;Psychological support;Spiritual support;Social support;Critical Care  Referral From Chaplain  Spiritual Encounters  Spiritual Needs Prayer  Stress Factors  Patient Stress Factors Health changes

## 2019-05-31 NOTE — Progress Notes (Signed)
CRITICAL CARE PROGRESS NOTE    Name: Chris Case MRN: [id number] DOB: [birth year]     LOS: 2   SUBJECTIVE FINDINGS & SIGNIFICANT EVENTS   Patient description:  Chris Case is a 49 y.o. male with medical history significant of COPD on 3 L oxygen, on BiPAP, thrombocytopenia, Chiari malformation, who presents with syncope and AMS. Patient has altered mental status, cannot give accurate medical history.  I called her sister, who is not living with patient, therefore she recommended me to call her roommate.  I called her roommate, who provided most of the medical history.  Per her roommate, patient has history of COPD, supposed to use of BiPAP, but patient is not using BiPAP consistently.  He has shortness breath, cough, but no chest pain.  No fever or chills.  Last night, patient passed out in bathroom, fell, and struck his eye on bathroom sink. Has has a small laceration above left eye which is repaired by EDP. Pt does not have nausea vomiting, diarrhea, abdominal pain, symptoms of UTI.  He moves all extremities.  No facial droop or slurred speech. PCCM consulted for further evaluation and treatment  Lines / Drains: PIVx2   Antibiotics: none   Protocols / Consultants: pccm/hospitalist    PAST MEDICAL HISTORY   Past Medical History:  Diagnosis Date  . COPD (chronic obstructive pulmonary disease) (HCC)   . Emphysema lung (HCC)   . 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 APPENDECTOMY  late 1980's  . LAPAROTOMY N/A 12/06/2016   Procedure: EXPLORATORY LAPAROTOMY- SBO;  Surgeon: [name], MD;  Location: [facility] ORS;  Service: General;  Laterality: N/A;     FAMILY HISTORY   Family History  Problem  Relation Age of Onset  . COPD Mother      SOCIAL HISTORY   Social History   Tobacco Use  . Smoking status: Former Smoker    Packs/day: 3.00    Years: 20.00    Pack years: 60.00    Types: Cigarettes    Quit date: 04/11/2016    Years since quitting: 3.1  . Smokeless tobacco: Never Used  Substance Use Topics  . Alcohol use: No    Comment: former drinker-1 pint of gin/week-quit 2015  . Drug use: Yes    Types: Marijuana    Comment: one joint /day x 2 y     MEDICATIONS   Current Medication:  Current Facility-Administered Medications:  .  acetaminophen (TYLENOL) tablet 650 mg, 650 mg, Oral, Q6H PRN **OR** acetaminophen (TYLENOL) suppository 650 mg, 650 mg, Rectal, Q6H PRN, Lorretta Harp, MD .  albuterol (PROVENTIL) (2.5 MG/3ML) 0.083% nebulizer solution 2.5 mg, 2.5 mg, Nebulization, Q4H PRN, Lorretta Harp, MD .  aspirin EC tablet 81 mg, 81 mg, Oral, Daily, Lorretta Harp, MD, 81 mg at 05/31/19 0911 .  Chlorhexidine Gluconate Cloth 2 % PADS 6 each, 6 each, Topical, Q0600, Lorretta Harp, MD, 6 each at 05/31/19 0600 .  dextromethorphan-guaiFENesin (MUCINEX DM) 30-600 MG per 12 hr tablet 1 tablet, 1 tablet, Oral, BID, Lorretta Harp, MD, 1 tablet at 05/31/19 0911 .  enoxaparin (LOVENOX) injection 40 mg, 40 mg, Subcutaneous, Q24H, Lorretta Harp, MD, 40 mg at 05/30/19 2116 .  MEDLINE mouth rinse, 15 mL, Mouth Rinse, BID, Lorretta Harp, MD .  mometasone-formoterol [facility]) 200-5 MCG/ACT inhaler 2 puff, 2 puff, Inhalation, BID, Lorretta Harp, MD, 2 puff  at 05/31/19 0912 .  ondansetron (ZOFRAN) tablet 4 mg, 4 mg, Oral, Q6H PRN **OR** ondansetron (ZOFRAN) injection 4 mg, 4 mg, Intravenous, Q6H PRN, Lorretta Harp, MD .  tiotropium (SPIRIVA) inhalation capsule ([facility] use ONLY) 18 mcg, 18 mcg, Inhalation, Daily, Erin Fulling, MD, 18 mcg at 05/31/19 0750    ALLERGIES   Patient has no known allergies.    REVIEW OF SYSTEMS    10 ROS is negative except for hunger  PHYSICAL EXAMINATION   Vital Signs: Temp:  [98.6  F (37 C)-98.8 F (37.1 C)] 98.6 F (37 C) (03/29 0800) Pulse Rate:  [70-95] 95 (03/29 0900) Resp:  [10-24] 15 (03/29 0900) BP: (93-118)/(67-79) 107/71 (03/29 0900) SpO2:  [86 %-100 %] 96 % (03/29 0900) FiO2 (%):  [40 %] 40 % (03/29 0200)  GENERAL:NAD HEAD: Normocephalic, atraumatic.  EYES: Pupils equal, round, reactive to light.  No scleral icterus.  MOUTH: Moist mucosal membrane. NECK: Supple. No thyromegaly. No nodules. No JVD.  PULMONARY: mild rhonchi bilaterally CARDIOVASCULAR: S1 and S2. Regular rate and rhythm. No murmurs, rubs, or gallops.  GASTROINTESTINAL: Soft, nontender, non-distended. No masses. Positive bowel sounds. No hepatosplenomegaly.  MUSCULOSKELETAL: No swelling, clubbing, or edema.  NEUROLOGIC: Mild distress due to acute illness SKIN:intact,warm,dry   PERTINENT DATA     Infusions:  Scheduled Medications: . aspirin EC  81 mg Oral Daily  . Chlorhexidine Gluconate Cloth  6 each Topical Q0600  . dextromethorphan-guaiFENesin  1 tablet Oral BID  . enoxaparin (LOVENOX) injection  40 mg Subcutaneous Q24H  . mouth rinse  15 mL Mouth Rinse BID  . mometasone-formoterol  2 puff Inhalation BID  . tiotropium  18 mcg Inhalation Daily   PRN Medications: acetaminophen **OR** acetaminophen, albuterol, ondansetron **OR** ondansetron (ZOFRAN) IV Hemodynamic parameters:   Intake/Output: 03/28 0701 - 03/29 0700 In: 240 [P.O.:240] Out: 1150 [Urine:1150]  Ventilator  Settings: FiO2 (%):  [40 %] 40 %   LAB RESULTS:  Basic Metabolic Panel: Recent Labs  Lab 05/29/19 0357 05/29/19 0357 05/30/19 0405 05/31/19 0428  NA 145  --  139 142  K 4.7   < > 4.2 4.3  CL 84*  --  85* 86*  CO2 50*  --  48* 47*  GLUCOSE 105*  --  92 80  BUN 20  --  18 16  CREATININE 0.91  --  0.46* 0.55*  CALCIUM 8.9  --  8.5* 8.3*  MG  --   --   --  1.9  PHOS  --   --   --  3.7   < > = values in this interval not displayed.   Liver Function Tests: Recent Labs  Lab 05/29/19 0357    AST 29  ALT 20  ALKPHOS 48  BILITOT 0.8  PROT 7.3  ALBUMIN 4.2   No results for input(s): LIPASE, AMYLASE in the last 168 hours. No results for input(s): AMMONIA in the last 168 hours. CBC: Recent Labs  Lab 05/29/19 0118 05/30/19 0405 05/31/19 0428  WBC 8.7 6.7 6.6  NEUTROABS  --   --  4.2  HGB 10.7* 9.6* 9.8*  HCT 35.9* 31.5* 32.5*  MCV 105.3* 101.3* 103.8*  PLT 122* 101* 92*   Cardiac Enzymes: No results for input(s): CKTOTAL, CKMB, CKMBINDEX, TROPONINI in the last 168 hours. BNP: Invalid input(s): POCBNP CBG: Recent Labs  Lab 05/29/19 0121 05/29/19 1549 05/30/19 0727 05/31/19 0706  GLUCAP 138* 108* 118* 127*     IMAGING RESULTS:  Imaging: CT ANGIO CHEST PE W  OR WO CONTRAST  Result Date: 05/29/2019 CLINICAL DATA:  Syncope, facial laceration, no chest pain or shortness of breath EXAM: CT ANGIOGRAPHY CHEST WITH CONTRAST TECHNIQUE: Multidetector CT imaging of the chest was performed using the standard protocol during bolus administration of intravenous contrast. Multiplanar CT image reconstructions and MIPs were obtained to evaluate the vascular anatomy. CONTRAST:  75mL OMNIPAQUE IOHEXOL 350 MG/ML SOLN COMPARISON:  09/23/2013 FINDINGS: Cardiovascular: Cardiomegaly with biventricular dilatation. No pericardial effusion. Dilated central pulmonary arteries. Satisfactory opacification of pulmonary arteries noted, and there is no evidence of pulmonary emboli. Adequate contrast opacification of the thoracic aorta with no evidence of dissection, aneurysm, or stenosis. There is classic 3-vessel brachiocephalic arch anatomy without proximal stenosis. Minimal aortic plaque in the arch. Mediastinum/Nodes: No hilar or mediastinal adenopathy. Lungs/Pleura: Pulmonary emphysema (ICD10-J43.9). No pleural effusion. No pneumothorax. Upper Abdomen: No acute findings. Musculoskeletal: No chest wall abnormality. No acute or significant osseous findings. Review of the MIP images confirms the  above findings. IMPRESSION: 1. Negative for acute PE or thoracic aortic dissection. 2. Dilated central pulmonary arteries suggesting pulmonary arterial hypertension. Emphysema (ICD10-J43.9). Electronically Signed   By: Corlis Leak  Hassell M.D.   On: 05/29/2019 10:32   ECHOCARDIOGRAM COMPLETE  Result Date: 05/30/2019    ECHOCARDIOGRAM REPORT   Patient Name:   Chris OreHAROLD Giebel Date of Exam: 05/30/2019 Medical Rec #:  [id number]    Height:       66.0 in Accession #:    [id number]   Weight:       121.7 lb Date of Birth:  05-11-1970   BSA:          1.619 m Patient Age:    48 years     BP:           129/78 mmHg Patient Gender: M            HR:           90 bpm. Exam Location:  [facility] Procedure: 2D Echo Indications:     Syncope  History:         Patient has prior history of Echocardiogram examinations.                  COPD.  Sonographer:     LTM Referring Phys:  Wynona [id number] XILIN NIU Diagnosing Phys: Julien Nordmannimothy Gollan MD  Sonographer Comments: Suboptimal apical window. IMPRESSIONS  1. Left ventricular ejection fraction, by estimation, is 55 to 60%. The left ventricle has normal function. The left ventricle has no regional wall motion abnormalities. Left ventricular diastolic parameters were normal. There is the interventricular septum is flattened in diastole ('D' shaped left ventricle), consistent with right ventricular volume overload.  2. Right ventricular systolic function is normal. The right ventricular size is moderately enlarged. There is moderately elevated pulmonary artery systolic pressure. The estimated right ventricular systolic pressure is 52.8 mmHg. FINDINGS  Left Ventricle: Left ventricular ejection fraction, by estimation, is 55 to 60%. The left ventricle has normal function. The left ventricle has no regional wall motion abnormalities. The left ventricular internal cavity size was normal in size. There is  no left ventricular hypertrophy. The interventricular septum is flattened in diastole ('D' shaped left ventricle),  consistent with right ventricular volume overload. Left ventricular diastolic parameters were normal. Right Ventricle: The right ventricular size is moderately enlarged. No increase in right ventricular wall thickness. Right ventricular systolic function is normal. There is moderately elevated pulmonary artery systolic pressure. The tricuspid regurgitant  velocity is 3.27 m/s, and with an  assumed right atrial pressure of 10 mmHg, the estimated right ventricular systolic pressure is 52.8 mmHg. Left Atrium: Left atrial size was normal in size. Right Atrium: Right atrial size was normal in size. Pericardium: There is no evidence of pericardial effusion. Mitral Valve: The mitral valve is normal in structure. Normal mobility of the mitral valve leaflets. No evidence of mitral valve regurgitation. No evidence of mitral valve stenosis. Tricuspid Valve: The tricuspid valve is normal in structure. Tricuspid valve regurgitation is mild . No evidence of tricuspid stenosis. Aortic Valve: The aortic valve is normal in structure. Aortic valve regurgitation is not visualized. No aortic stenosis is present. Aortic valve mean gradient measures 4.0 mmHg. Aortic valve peak gradient measures 6.7 mmHg. Aortic valve area, by VTI measures 2.44 cm. Pulmonic Valve: The pulmonic valve was normal in structure. Pulmonic valve regurgitation is not visualized. No evidence of pulmonic stenosis. Aorta: The aortic root is normal in size and structure. Venous: The inferior vena cava is normal in size with greater than 50% respiratory variability, suggesting right atrial pressure of 3 mmHg. IAS/Shunts: No atrial level shunt detected by color flow Doppler.  LEFT VENTRICLE PLAX 2D LVIDd:         5.13 cm  Diastology LVIDs:         3.01 cm  LV e' lateral:   14.10 cm/s LV PW:         1.45 cm  LV E/e' lateral: 4.8 LV IVS:        1.49 cm  LV e' medial:    12.50 cm/s LVOT diam:     2.20 cm  LV E/e' medial:  5.4 LV SV:         54 LV SV Index:   34 LVOT Area:      3.80 cm  RIGHT VENTRICLE RV S prime:     16.30 cm/s TAPSE (M-mode): 2.8 cm LEFT ATRIUM         Index LA diam:    2.90 cm 1.79 cm/m  AORTIC VALVE AV Area (Vmax):    2.46 cm AV Area (Vmean):   2.03 cm AV Area (VTI):     2.44 cm AV Vmax:           129.00 cm/s AV Vmean:          90.900 cm/s AV VTI:            0.223 m AV Peak Grad:      6.7 mmHg AV Mean Grad:      4.0 mmHg LVOT Vmax:         83.50 cm/s LVOT Vmean:        48.500 cm/s LVOT VTI:          0.143 m LVOT/AV VTI ratio: 0.64  AORTA Ao Root diam: 3.80 cm MITRAL VALVE               TRICUSPID VALVE MV Area (PHT): 5.95 cm    TR Peak grad:   42.8 mmHg MV E velocity: 67.90 cm/s  TR Vmax:        327.00 cm/s MV A velocity: 56.30 cm/s MV E/A ratio:  1.21        SHUNTS                            Systemic VTI:  0.14 m  Systemic Diam: 2.20 cm Ida Rogue MD Electronically signed by Ida Rogue MD Signature Date/Time: 05/30/2019/1:41:40 PM    Final       ASSESSMENT AND PLAN    -Multidisciplinary rounds held today  Acute hypoxemic and hypercapnic respiratory failure Due to advanced panlobular emphysema and COPD -continue Full NIV support -patient continues to remove BIPAP mask off  - ABG today with severe hypercapnia and acidemia  GI/Nutrition GI PROPHYLAXIS as indicated DIET-->TF's as tolerated Constipation protocol as indicated  ENDO - ICU hypoglycemic\Hyperglycemia protocol -check FSBS per protocol   ELECTROLYTES -follow labs as needed -replace as needed -pharmacy consultation   DVT/GI PRX ordered -SCDs  TRANSFUSIONS AS NEEDED MONITOR FSBS ASSESS the need for LABS as needed   Critical care provider statement:    Critical care time (minutes):  33   Critical care time was exclusive of:  Separately billable procedures and treating other patients   Critical care was necessary to treat or prevent imminent or life-threatening deterioration of the following conditions:  Acute hypoxemic and  hypercapnic respiratory failure   Critical care was time spent personally by me on the following activities:  Development of treatment plan with patient or surrogate, discussions with consultants, evaluation of patient's response to treatment, examination of patient, obtaining history from patient or surrogate, ordering and performing treatments and interventions, ordering and review of laboratory studies and re-evaluation of patient's condition.  I assumed direction of critical care for this patient from another provider in my specialty: no    This document was prepared using Dragon voice recognition software and may include unintentional dictation errors.    Ottie Glazier, M.D.  Division of [place]

## 2019-05-31 NOTE — TOC Initial Note (Signed)
Transition of Care [facility]) - Initial/Assessment Note    Patient Details  Name: Chris Case MRN: [id number] Date of Birth: 28-Feb-1971  Transition of [facility] CM/SW Contact:    Liliana Cline, LCSW Phone Number: 05/31/2019, 10:45 AM  Clinical Narrative:                CSW consulted for Trilogy need. CSW spoke with [employer] Representative [name] who confirmed patient already has a Trilogy at home. Per MD, patient may need a different mask for his Trilogy. CSW informed Nida Boatman who will have [employer] Representatives follow up on this.  CSW spoke with patient at bedside. Patient reported he lives with a roommate and has transportation. Patient was confused when asked about Trilogy. Patient denied any needs at home at this time. Patient reported he would be agreeable to Home Health if recommended after PT/OT evaluations. CSW will continue to follow for PT/OT recommendations if any. CSW encouraged patient to reach out if needs arise.   Expected Discharge Plan: Home/Self Care Barriers to Discharge: Continued Medical Work up   Patient Goals and CMS Choice        Expected Discharge Plan and Services Expected Discharge Plan: Home/Self Care       Living arrangements for the past 2 months: Single Family Home                   DME Agency: [employer] Date DME Agency Contacted: 05/31/19   Representative spoke with at DME Agency: Mitchell Heir            Prior Living Arrangements/Services Living arrangements for the past 2 months: Single Family Home Lives with:: Roommate Patient language and need for interpreter reviewed:: Yes Do you feel safe going back to the place where you live?: Yes      Need for Family Participation in Patient Care: Yes (Comment) Care giver support system in place?: Yes (comment) Current home services: DME Criminal Activity/Legal Involvement Pertinent to Current Situation/Hospitalization: No - Comment as needed  Activities of Daily Living      Permission  Sought/Granted   Permission granted to share information with : Yes, Verbal Permission Granted     Permission granted to share info w AGENCY: [employer], HH agencies if indicated        Emotional Assessment Appearance:: Appears stated age Attitude/Demeanor/Rapport: Engaged Affect (typically observed): Calm Orientation: : Oriented to Self, Oriented to Place, Oriented to  Time, Oriented to Situation Alcohol / Substance Use: Not Applicable Psych Involvement: No (comment)  Admission diagnosis:  Left eyelid laceration, initial encounter [S01.112A] Acute respiratory failure with hypoxia and hypercapnia (HCC) [J96.01, J96.02] Syncope, unspecified syncope type [R55] Acute on chronic respiratory failure with hypoxia and hypercapnia (HCC) [W96.75, J96.22] Patient Active Problem List   Diagnosis Date Noted  . COPD, very severe (HCC)   . Elevated troponin 05/29/2019  . Syncope 05/29/2019  . Acute on chronic respiratory failure with hypoxemia (HCC) 04/16/2019  . Acute respiratory failure with hypoxia (HCC) 01/24/2019  . Thrombocytopenia (HCC) 01/24/2019  . Protein-calorie malnutrition, severe 12/10/2016  . Intestinal adhesions with complete obstruction (HCC) 12/05/2016  . Constipation   . SBO (small bowel obstruction) (HCC)   . Acute bronchitis 09/26/2015  . COPD exacerbation (HCC) 09/24/2015  . Acute on chronic respiratory failure with hypoxia and hypercapnia (HCC) 09/24/2015  . Abnormal ECG 09/24/2015  . Edema, lower extremity 09/24/2015   PCP:  [facility]:   [facility] - [place], [place] - [address]  [place] [address] Phone: [phone] Fax: [phone]  [place], [place] - [place] [address] [facility] [address] [address] Phone: [phone] Fax: [phone]     Social Determinants of Health ([place]) Interventions    Readmission Risk Interventions No flowsheet data  found.

## 2019-05-31 NOTE — Progress Notes (Signed)
Patient ID: Chris Case, male   DOB: February 08, 1971, 49 y.o.   MRN: [id number] [facility] PROGRESS NOTE  Chris Case MVE:[id number] DOB: 1970/04/04 DOA: 05/29/2019 PCP: [facility]  HPI/Subjective: Patient feeling okay.  He states that he was not wearing his BiPAP at night as suggested.  States he is not short of breath.  He is not coughing.  He asked me when he can go home.  Objective: Vitals:   05/31/19 1100 05/31/19 1200  BP: 113/77 117/79  Pulse: 94 88  Resp: 19 20  Temp:    SpO2: 99% 90%    Intake/Output Summary (Last 24 hours) at 05/31/2019 1206 Last data filed at 05/31/2019 1204 Gross per 24 hour  Intake 360 ml  Output 700 ml  Net -340 ml   Filed Weights   05/29/19 0111 05/29/19 1547  Weight: 56.6 kg 55.2 kg    ROS: Review of Systems  Constitutional: Negative for fever.  Eyes: Negative for blurred vision.  Respiratory: Negative for cough and shortness of breath.   Cardiovascular: Negative for chest pain.  Gastrointestinal: Negative for abdominal pain, nausea and vomiting.  Genitourinary: Negative for dysuria.  Musculoskeletal: Negative for joint pain.  Neurological: Negative for headaches.   Exam: Physical Exam  Constitutional: He is oriented to person, place, and time.  HENT:  Nose: No mucosal edema.  Mouth/Throat: No oropharyngeal exudate or posterior oropharyngeal edema.  Eyes: Conjunctivae and lids are normal.  Cardiovascular: S1 normal and S2 normal. Tachycardia present. Exam reveals no gallop.  No murmur heard. Respiratory: No respiratory distress. He has decreased breath sounds in the right lower field and the left lower field. He has no wheezes. He has rhonchi in the right lower field and the left lower field. He has no rales.  GI: Soft. Bowel sounds are normal. There is no abdominal tenderness.  Musculoskeletal:     Right ankle: No swelling.     Left ankle: No swelling.  Lymphadenopathy:    He has no cervical adenopathy.   Neurological: He is alert and oriented to person, place, and time. No cranial nerve deficit.  Skin: Skin is warm. No rash noted. Nails show no clubbing.  Psychiatric: He has a normal mood and affect.      Data Reviewed: Basic Metabolic Panel: Recent Labs  Lab 05/29/19 0357 05/30/19 0405 05/31/19 0428  NA 145 139 142  K 4.7 4.2 4.3  CL 84* 85* 86*  CO2 50* 48* 47*  GLUCOSE 105* 92 80  BUN 20 18 16   CREATININE 0.91 0.46* 0.55*  CALCIUM 8.9 8.5* 8.3*  MG  --   --  1.9  PHOS  --   --  3.7   Liver Function Tests: Recent Labs  Lab 05/29/19 0357  AST 29  ALT 20  ALKPHOS 48  BILITOT 0.8  PROT 7.3  ALBUMIN 4.2   CBC: Recent Labs  Lab 05/29/19 0118 05/30/19 0405 05/31/19 0428  WBC 8.7 6.7 6.6  NEUTROABS  --   --  4.2  HGB 10.7* 9.6* 9.8*  HCT 35.9* 31.5* 32.5*  MCV 105.3* 101.3* 103.8*  PLT 122* 101* 92*   BNP (last 3 results) Recent Labs    04/16/19 1842  BNP 86.0     CBG: Recent Labs  Lab 05/29/19 0121 05/29/19 1549 05/30/19 0727 05/31/19 0706  GLUCAP 138* 108* 118* 127*    Recent Results (from the past 240 hour(s))  Respiratory Panel by RT PCR (Flu A&B, Covid) - Nasopharyngeal Swab  Status: None   Collection Time: 05/29/19  5:32 AM   Specimen: Nasopharyngeal Swab  Result Value Ref Range Status   SARS Coronavirus 2 by RT PCR NEGATIVE NEGATIVE Final    Comment: (NOTE) SARS-CoV-2 target nucleic acids are NOT DETECTED. The SARS-CoV-2 RNA is generally detectable in upper respiratoy specimens during the acute phase of infection. The lowest concentration of SARS-CoV-2 viral copies this assay can detect is 131 copies/mL. A negative result does not preclude SARS-Cov-2 infection and should not be used as the sole basis for treatment or other patient management decisions. A negative result may occur with  improper specimen collection/handling, submission of specimen other than nasopharyngeal swab, presence of viral mutation(s) within the areas  targeted by this assay, and inadequate number of viral copies (<131 copies/mL). A negative result must be combined with clinical observations, patient history, and epidemiological information. The expected result is Negative. Fact Sheet for Patients:  PinkCheek.be Fact Sheet for Healthcare Providers:  GravelBags.it This test is not yet ap proved or cleared by the [place] FDA and  has been authorized for detection and/or diagnosis of SARS-CoV-2 by FDA under an Emergency Use Authorization (EUA). This EUA will remain  in effect (meaning this test can be used) for the duration of the COVID-19 declaration under Section 564(b)(1) of the Act, 21 U.S.C. section 360bbb-3(b)(1), unless the authorization is terminated or revoked sooner.    Influenza A by PCR NEGATIVE NEGATIVE Final   Influenza B by PCR NEGATIVE NEGATIVE Final    Comment: (NOTE) The Xpert Xpress SARS-CoV-2/FLU/RSV assay is intended as an aid in  the diagnosis of influenza from Nasopharyngeal swab specimens and  should not be used as a sole basis for treatment. Nasal washings and  aspirates are unacceptable for Xpert Xpress SARS-CoV-2/FLU/RSV  testing. Fact Sheet for Patients: PinkCheek.be Fact Sheet for Healthcare Providers: GravelBags.it This test is not yet approved or cleared by the [place] FDA and  has been authorized for detection and/or diagnosis of SARS-CoV-2 by  FDA under an Emergency Use Authorization (EUA). This EUA will remain  in effect (meaning this test can be used) for the duration of the  Covid-19 declaration under Section 564(b)(1) of the Act, 21  U.S.C. section 360bbb-3(b)(1), unless the authorization is  terminated or revoked. Performed at [facility], [address] [place], [place] [address]   MRSA PCR Screening     Status: None   Collection Time: 05/29/19  3:52 PM    Specimen: Nasopharyngeal  Result Value Ref Range Status   MRSA by PCR NEGATIVE NEGATIVE Final    Comment:        The GeneXpert MRSA Assay (FDA approved for NASAL specimens only), is one component of a comprehensive MRSA colonization surveillance program. It is not intended to diagnose MRSA infection nor to guide or monitor treatment for MRSA infections. Performed at [facility], [address] [place], [place] [address]      Studies: ECHOCARDIOGRAM COMPLETE  Result Date: 05/30/2019    ECHOCARDIOGRAM REPORT   Patient Name:   Chris Case Date of Exam: 05/30/2019 Medical Rec #:  [id number]    Height:       66.0 in Accession #:    [id number]   Weight:       121.7 lb Date of Birth:  1971-02-27   BSA:          1.619 m Patient Age:    39 years     BP:  129/78 mmHg Patient Gender: M            HR:           90 bpm. Exam Location:  [facility] Procedure: 2D Echo Indications:     Syncope  History:         Patient has prior history of Echocardiogram examinations.                  COPD.  Sonographer:     LTM Referring Phys:  Wynona [id number] XILIN NIU Diagnosing Phys: Julien Nordmannimothy Gollan MD  Sonographer Comments: Suboptimal apical window. IMPRESSIONS  1. Left ventricular ejection fraction, by estimation, is 55 to 60%. The left ventricle has normal function. The left ventricle has no regional wall motion abnormalities. Left ventricular diastolic parameters were normal. There is the interventricular septum is flattened in diastole ('D' shaped left ventricle), consistent with right ventricular volume overload.  2. Right ventricular systolic function is normal. The right ventricular size is moderately enlarged. There is moderately elevated pulmonary artery systolic pressure. The estimated right ventricular systolic pressure is 52.8 mmHg. FINDINGS  Left Ventricle: Left ventricular ejection fraction, by estimation, is 55 to 60%. The left ventricle has normal function. The left ventricle has no regional wall motion  abnormalities. The left ventricular internal cavity size was normal in size. There is  no left ventricular hypertrophy. The interventricular septum is flattened in diastole ('D' shaped left ventricle), consistent with right ventricular volume overload. Left ventricular diastolic parameters were normal. Right Ventricle: The right ventricular size is moderately enlarged. No increase in right ventricular wall thickness. Right ventricular systolic function is normal. There is moderately elevated pulmonary artery systolic pressure. The tricuspid regurgitant  velocity is 3.27 m/s, and with an assumed right atrial pressure of 10 mmHg, the estimated right ventricular systolic pressure is 52.8 mmHg. Left Atrium: Left atrial size was normal in size. Right Atrium: Right atrial size was normal in size. Pericardium: There is no evidence of pericardial effusion. Mitral Valve: The mitral valve is normal in structure. Normal mobility of the mitral valve leaflets. No evidence of mitral valve regurgitation. No evidence of mitral valve stenosis. Tricuspid Valve: The tricuspid valve is normal in structure. Tricuspid valve regurgitation is mild . No evidence of tricuspid stenosis. Aortic Valve: The aortic valve is normal in structure. Aortic valve regurgitation is not visualized. No aortic stenosis is present. Aortic valve mean gradient measures 4.0 mmHg. Aortic valve peak gradient measures 6.7 mmHg. Aortic valve area, by VTI measures 2.44 cm. Pulmonic Valve: The pulmonic valve was normal in structure. Pulmonic valve regurgitation is not visualized. No evidence of pulmonic stenosis. Aorta: The aortic root is normal in size and structure. Venous: The inferior vena cava is normal in size with greater than 50% respiratory variability, suggesting right atrial pressure of 3 mmHg. IAS/Shunts: No atrial level shunt detected by color flow Doppler.  LEFT VENTRICLE PLAX 2D LVIDd:         5.13 cm  Diastology LVIDs:         3.01 cm  LV e' lateral:    14.10 cm/s LV PW:         1.45 cm  LV E/e' lateral: 4.8 LV IVS:        1.49 cm  LV e' medial:    12.50 cm/s LVOT diam:     2.20 cm  LV E/e' medial:  5.4 LV SV:         54 LV SV Index:   34 LVOT Area:  3.80 cm  RIGHT VENTRICLE RV S prime:     16.30 cm/s TAPSE (M-mode): 2.8 cm LEFT ATRIUM         Index LA diam:    2.90 cm 1.79 cm/m  AORTIC VALVE AV Area (Vmax):    2.46 cm AV Area (Vmean):   2.03 cm AV Area (VTI):     2.44 cm AV Vmax:           129.00 cm/s AV Vmean:          90.900 cm/s AV VTI:            0.223 m AV Peak Grad:      6.7 mmHg AV Mean Grad:      4.0 mmHg LVOT Vmax:         83.50 cm/s LVOT Vmean:        48.500 cm/s LVOT VTI:          0.143 m LVOT/AV VTI ratio: 0.64  AORTA Ao Root diam: 3.80 cm MITRAL VALVE               TRICUSPID VALVE MV Area (PHT): 5.95 cm    TR Peak grad:   42.8 mmHg MV E velocity: 67.90 cm/s  TR Vmax:        327.00 cm/s MV A velocity: 56.30 cm/s MV E/A ratio:  1.21        SHUNTS                            Systemic VTI:  0.14 m                            Systemic Diam: 2.20 cm Julien Nordmann MD Electronically signed by Julien Nordmann MD Signature Date/Time: 05/30/2019/1:41:40 PM    Final     Scheduled Meds: . aspirin EC  81 mg Oral Daily  . Chlorhexidine Gluconate Cloth  6 each Topical Q0600  . dextromethorphan-guaiFENesin  1 tablet Oral BID  . enoxaparin (LOVENOX) injection  40 mg Subcutaneous Q24H  . mouth rinse  15 mL Mouth Rinse BID  . mometasone-formoterol  2 puff Inhalation BID  . tiotropium  18 mcg Inhalation Daily     Assessment/Plan:  1. Acute on chronic hypoxic and hypercapnic respiratory failure.  Case discussed with critical care specialist and we decided to get a blood gas today's to see where we stand with his high PCO2.  Blood gas showed a PCO2 greater than 120 but his pH was compensated.  I have a feeling that he may live at a high chronic baseline level.  We will get palliative care.  Patient placed on BiPAP during the day.  I explained to the  patient he must wear BiPAP for his can end up back in the hospital. 2. Very severe COPD on oxygen 3 L at home.  Continue inhalers. 3. CT scan commenting on high pulmonary pressures.  Echocardiogram also shows high pulmonary pressures. 4. Elevated troponin secondary to demand ischemia with acute on chronic hypoxic and hypercapnic respiratory failure 5. Chronic thrombocytopenia.  Reviewing old records hepatitis C negative.  Code Status:     Code Status Orders  (From admission, onward)         Start     Ordered   05/29/19 0910  Full code  Continuous     05/29/19 0910        Code Status History  Date Active Date Inactive Code Status Order ID Comments User Context   04/17/2019 0026 04/20/2019 1937 Full Code [id number]  Rometta Emery, MD ED   01/24/2019 1932 01/27/2019 1831 Full Code [id number]  Anselm Jungling, DO ED   12/05/2016 0157 12/15/2016 1800 Full Code [id number]  Ancil Linsey, MD ED   09/24/2015 1353 09/24/2015 1430 Full Code [id number]  Marguarite Arbour, MD Inpatient   Advance Care Planning Activity     Family Communication: I spoke with [facility] yesterday.  I left a message for her today. Disposition Plan: Patient will need to come off continuous BiPAP prior to making a plan.  Palliative care consultation today.  With PCO2 greater than 120 on blood gas today I am hesitant about making a disposition at this time.  Consultants:  Critical care specialist  Time spent: 27 minutes, case discussed with critical care specialist.  Alford Highland  [facility]

## 2019-05-31 NOTE — Evaluation (Signed)
Physical Therapy Evaluation Patient Details Name: Chris Case MRN: [id number] DOB: [birth year] Today's Date: 05/31/2019   History of Present Illness  Pt is a 49 y.o. male presenting to hospital 3/27 with syncopal episode in bathroom hitting face on sink and sustaining laceration over L eyelid.  Pt admitted with acute on chronic respiratory failure with hypoxia and hypercapnia d/t COPD exacerbation; also syncope.  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  Clinical Impression  Prior to hospital admission, pt was independent; 3 L home O2 baseline; lives in a 1 level home with 3 STE with R railing and has a room-mate.  Currently pt is modified independent with bed mobility; CGA with transfers; and CGA with ambulation 160 feet (increased lateral sway noted at times causing altered stepping pattern but pt always able to self correct--CGA provided for safety).  O2 sats 93% or greater on 3 L O2 via nasal cannula during session/with activity.  Pt would benefit from skilled PT to address noted impairments and functional limitations (see below for any additional details).  Anticipate pt will continue to improve during hospitalization and d/t this, no PT follow up needs expected upon hospital discharge.    Follow Up Recommendations No PT follow up    Equipment Recommendations  None recommended by PT    Recommendations for Other Services       Precautions / Restrictions Precautions Precautions: Fall Restrictions Weight Bearing Restrictions: No      Mobility  Bed Mobility Overal bed mobility: Modified Independent             General bed mobility comments: Semi-supine to/from sitting without any noted difficulties  Transfers Overall transfer level: Needs assistance Equipment used: None Transfers: Sit to/from Stand Sit to Stand: Min guard         General transfer comment:  fairly strong stand from bed x2 trials  Ambulation/Gait Ambulation/Gait assistance: Min guard Gait Distance (Feet): 160 Feet Assistive device: None   Gait velocity: decreased   General Gait Details: increased lateral sway at times causing altered stepping pattern but pt always able to self correct (CGA provided for safety); partial step through gait pattern  Stairs            Wheelchair Mobility    Modified Rankin (Stroke Patients Only)       Balance Overall balance assessment: Needs assistance Sitting-balance support: No upper extremity supported;Feet supported Sitting balance-Leahy Scale: Normal Sitting balance - Comments: steady sitting reaching outside BOS   Standing balance support: No upper extremity supported Standing balance-Leahy Scale: Good Standing balance comment: steady standing reaching within BOS                             Pertinent Vitals/Pain   Vitals stable and WFL throughout treatment session (on 3 L O2 via nasal cannula).    Home Living Family/patient expects to be discharged to:: Private residence Living Arrangements: Non-relatives/Friends(Room-mate) Available Help at Discharge: Friend(s);Available PRN/intermittently Type of Home: House Home Access: Stairs to enter Entrance Stairs-Rails: Right Entrance Stairs-Number of Steps: 3 Home Layout: One level Home Equipment: Grab bars - tub/shower(shower stool) Additional Comments: Pt reports roommate has a few canes    Prior Function Level of Independence: Independent         Comments: Uses 3 L home O2 at baseline     Hand Dominance   Dominant Hand: Right  Extremity/Trunk Assessment   Upper Extremity Assessment Upper Extremity Assessment: Generalized weakness    Lower Extremity Assessment Lower Extremity Assessment: Generalized weakness    Cervical / Trunk Assessment Cervical / Trunk Assessment: Normal  Communication   Communication: No difficulties  Cognition  Arousal/Alertness: Awake/alert Behavior During Therapy: WFL for tasks assessed/performed Overall Cognitive Status: Within Functional Limits for tasks assessed                                        General Comments   Nursing cleared pt for participation in physical therapy.  Pt agreeable to PT session.    Exercises     Assessment/Plan    PT Assessment Patient needs continued PT services  PT Problem List Decreased strength;Decreased balance;Decreased mobility       PT Treatment Interventions DME instruction;Gait training;Stair training;Functional mobility training;Therapeutic activities;Therapeutic exercise;Balance training;Patient/family education    PT Goals (Current goals can be found in the Care Plan section)  Acute Rehab PT Goals Patient Stated Goal: to go home soon PT Goal Formulation: With patient Time For Goal Achievement: 06/14/19 Potential to Achieve Goals: Fair    Frequency Min 2X/week   Barriers to discharge        Co-evaluation               AM-PAC PT "6 Clicks" Mobility  Outcome Measure Help needed turning from your back to your side while in a flat bed without using bedrails?: None Help needed moving from lying on your back to sitting on the side of a flat bed without using bedrails?: None Help needed moving to and from a bed to a chair (including a wheelchair)?: A Little Help needed standing up from a chair using your arms (e.g., wheelchair or bedside chair)?: A Little Help needed to walk in hospital room?: A Little Help needed climbing 3-5 steps with a railing? : A Little 6 Click Score: 20    End of Session Equipment Utilized During Treatment: Gait belt;Oxygen(3 L O2 via nasal cannula) Activity Tolerance: Patient tolerated treatment well Patient left: in bed;with call bell/phone within reach;with bed alarm set Nurse Communication: Mobility status;Precautions PT Visit Diagnosis: Unsteadiness on feet (R26.81);Muscle weakness  (generalized) (M62.81);History of falling (Z91.81);Other abnormalities of gait and mobility (R26.89)    Time: 7209-4709 PT Time Calculation (min) (ACUTE ONLY): 31 min   Charges:   PT Evaluation $PT Eval Low Complexity: 1 Low PT Treatments $Therapeutic Exercise: 8-22 mins       Hendricks Limes, PT 05/31/19, 1:55 PM

## 2019-05-31 NOTE — Consult Note (Signed)
Consultation Note Date: 05/31/2019   Patient Name: Chris Case  DOB: April 30, 1970  MRN: [id number]  Age / Sex: 49 y.o., male  PCP: [facility] Referring Physician: Alford Highland, MD  Reason for Consultation: Establishing goals of care and Psychosocial/spiritual support  HPI/Patient Profile: 49 y.o. male  with past medical history of COPD, emphysema (quit 3 years ago) 60 pack year history, positive TB test, treated with meds for 6 months, appendectomy, exp lap 2018 for SBO,  admitted on 05/29/2019 with acute on chronic hypoxic and hypercapnic respiratory failure.   Clinical Assessment and Goals of Care: Mr. Jozwiak is resting quietly in bed.  He greets me, making and keeping eye contact.  He appears acutely/chronically ill and frail.  He is able to make his basic needs known.  There is no family at bedside at this time.    Mr. Kishi has BiPAP in place and therefore we have limited conversation today.    We talk about HCPOA, see below.   We talk about code status, "treat the treatable" but allow a natural death.   Mr. Ilic would benefit from out patient palliative services, but I am unable to discuss this with him today due to use of BiPAP.   PMT to continue to follow.    HCPOA    NEXT OF KIN -  We talk about HCPOA.  Mr. Riese is not married, has one adult child.  His parents are deceased.  I encourage him to think about who would speak for him if he could not.  I share that [facility] will help him completed paperwork if he desires.     SUMMARY OF RECOMMENDATIONS   Continue full scope treatment. Continue code status/HCPOA discussions.  Recommend out patient palliative.   Code Status/Advance Care Planning:  Full code - We briefly discuss "treat the treatable" care  Symptom Management:   Per hospitalist, no additional needs.   Palliative Prophylaxis:   Oral Care  Additional  Recommendations (Limitations, Scope, Preferences):  Full Scope Treatment  Psycho-social/Spiritual:   Desire for further Chaplaincy support:no  Additional Recommendations: Caregiving  Support/Resources and Education on Hospice  Prognosis:   Unable to determine, base don outcomes.  6 months or less would not be surprising, but prognostication for COPD can be difficult. Would clearly benefit from Palliative care outpatient.   Discharge Planning: home with [facility]      Primary Diagnoses: Present on Admission: . Thrombocytopenia (HCC) . Acute on chronic respiratory failure with hypoxia and hypercapnia (HCC) . COPD exacerbation (HCC) . Elevated troponin . Syncope   I have reviewed the medical record, interviewed the patient and family, and examined the patient. The following aspects are pertinent.  Past Medical History:  Diagnosis Date  . COPD (chronic obstructive pulmonary disease) (HCC)   . Emphysema lung (HCC)   . 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 Marital status: Married    Spouse name: Not on file  .  Number of children: Not on file  . Years of education: Not on file  . Highest education level: Not on file  Occupational History  . Not on file  Tobacco Use  . Smoking status: Former Smoker    Packs/day: 3.00    Years: 20.00    Pack years: 60.00    Types: Cigarettes    Quit date: 04/11/2016    Years since quitting: 3.1  . Smokeless tobacco: Never Used  Substance and Sexual Activity  . Alcohol use: No    Comment: former drinker-1 pint of gin/week-quit 2015  . Drug use: Yes    Types: Marijuana    Comment: one joint /day x 2 y  . Sexual activity: Yes  Other Topics Concern  . Not on file  Social History Narrative  . Not on file   Social Determinants of Health   Financial Resource Strain:   . Difficulty of Paying Living Expenses:   Food Insecurity:   . Worried About Programme researcher, broadcasting/film/video in the Last  Year:   . Barista in the Last Year:   Transportation Needs:   . Freight forwarder (Medical):   Marland Kitchen Lack of Transportation (Non-Medical):   Physical Activity:   . Days of Exercise per Week:   . Minutes of Exercise per Session:   Stress:   . Feeling of Stress :   Social Connections:   . Frequency of Communication with Friends and Family:   . Frequency of Social Gatherings with Friends and Family:   . Attends Religious Services:   . Active Member of Clubs or Organizations:   . Attends Banker Meetings:   Marland Kitchen Marital Status:    Family History  Problem Relation Age of Onset  . COPD Mother    Scheduled Meds: . aspirin EC  81 mg Oral Daily  . Chlorhexidine Gluconate Cloth  6 each Topical Q0600  . dextromethorphan-guaiFENesin  1 tablet Oral BID  . enoxaparin (LOVENOX) injection  40 mg Subcutaneous Q24H  . mouth rinse  15 mL Mouth Rinse BID  . mometasone-formoterol  2 puff Inhalation BID  . tiotropium  18 mcg Inhalation Daily   Continuous Infusions: PRN Meds:.acetaminophen **OR** acetaminophen, albuterol, ondansetron **OR** ondansetron (ZOFRAN) IV Medications Prior to Admission:  Prior to Admission medications   Medication Sig Start Date End Date Taking? Authorizing Provider  albuterol (PROVENTIL) (2.5 MG/3ML) 0.083% nebulizer solution Inhale 3 mLs into the lungs every 4 (four) hours as needed for wheezing or shortness of breath. 09/26/15  Yes Katharina Caper, MD  albuterol (VENTOLIN HFA) 108 (90 Base) MCG/ACT inhaler Inhale 2 puffs into the lungs every 4 (four) hours as needed for shortness of breath. 01/09/19  Yes [provider]  mometasone-formoterol (DULERA) 200-5 MCG/ACT AERO Inhale 2 puffs into the lungs 2 (two) times daily. 09/26/15  Yes Katharina Caper, MD  Tiotropium Bromide Monohydrate (SPIRIVA RESPIMAT) 1.25 MCG/ACT AERS Inhale 1 puff into the lungs daily.    Yes [provider]   No Known Allergies Review of Systems  Unable to perform  ROS: Other    Physical Exam Vitals and nursing note reviewed.  Constitutional:      General: He is not in acute distress.    Appearance: He is ill-appearing.  Cardiovascular:     Rate and Rhythm: Normal rate.  Pulmonary:     Comments: BiPAP in place Abdominal:     General: Abdomen is flat.  Skin:    General: Skin is warm  and dry.  Neurological:     Mental Status: He is alert and oriented to person, place, and time.  Psychiatric:     Comments: Calm and cooperative      Vital Signs: BP 102/77   Pulse 79   Temp 98.4 F (36.9 C) (Axillary)   Resp 18   Ht 5\' 6"  (1.676 m)   Wt 55.2 kg   SpO2 96%   BMI 19.64 kg/m  Pain Scale: 0-10   Pain Score: 0-No pain   SpO2: SpO2: 96 % O2 Device:SpO2: 96 % O2 Flow Rate: .O2 Flow Rate (L/min): 3 L/min  IO: Intake/output summary:   Intake/Output Summary (Last 24 hours) at 05/31/2019 1549 Last data filed at 05/31/2019 1516 Gross per 24 hour  Intake 120 ml  Output 1050 ml  Net -930 ml    LBM: Last BM Date: 05/28/19 Baseline Weight: Weight: 56.6 kg Most recent weight: Weight: 55.2 kg     Palliative Assessment/Data:   Flowsheet Rows     Most Recent Value  Intake Tab  Referral Department  Hospitalist  Unit at Time of Referral  Intermediate Care Unit  Palliative Care Primary Diagnosis  Pulmonary  Date Notified  05/30/19  Palliative Care Type  New Palliative care  Reason for referral  Clarify Goals of Care  Date of Admission  05/29/19  Date first seen by Palliative Care  05/31/19  # of days Palliative referral response time  1 Day(s)  # of days IP prior to Palliative referral  1  Clinical Assessment  Palliative Performance Scale Score  50%  Pain Max last 24 hours  Not able to report  Pain Min Last 24 hours  Not able to report  Dyspnea Max Last 24 Hours  Not able to report  Dyspnea Min Last 24 hours  Not able to report  Psychosocial & Spiritual Assessment  Palliative Care Outcomes      Time In: 1350 Time Out: 1420    Time Total: 30 minutes  Greater than 50%  of this time was spent counseling and coordinating care related to the above assessment and plan.  Signed by: Drue Novel, NP   Please contact Palliative Medicine Team phone at [phone] for questions and concerns.  For individual provider: See Shea Evans

## 2019-05-31 NOTE — Progress Notes (Signed)
PCO2 on ABG is >120. MD aware. RT at bedside placing pt back on bipap for now. Pt is not in any distress at this time.

## 2019-06-01 DIAGNOSIS — S01112A Laceration without foreign body of left eyelid and periocular area, initial encounter: Secondary | ICD-10-CM

## 2019-06-01 DIAGNOSIS — G9341 Metabolic encephalopathy: Secondary | ICD-10-CM

## 2019-06-01 DIAGNOSIS — S01112S Laceration without foreign body of left eyelid and periocular area, sequela: Secondary | ICD-10-CM

## 2019-06-01 LAB — BLOOD GAS, ARTERIAL
Acid-Base Excess: 27.3 mmol/L — ABNORMAL HIGH (ref 0.0–2.0)
Acid-Base Excess: 26.7 mmol/L — ABNORMAL HIGH (ref 0.0–2.0)
Bicarbonate: 57.7 mmol/L — ABNORMAL HIGH (ref 20.0–28.0)
Bicarbonate: 59.1 mmol/L — ABNORMAL HIGH (ref 20.0–28.0)
Delivery systems: POSITIVE
Expiratory PAP: 8
FIO2: 0.32
FIO2: 0.3
Inspiratory PAP: 16
O2 Saturation: 97.6 %
O2 Saturation: 87.2 %
Patient temperature: 37
Patient temperature: 37
pCO2 arterial: 87 mmHg (ref 32.0–48.0)
pCO2 arterial: 107 mmHg (ref 32.0–48.0)
pH, Arterial: 7.43 (ref 7.350–7.450)
pH, Arterial: 7.35 (ref 7.350–7.450)
pO2, Arterial: 95 mmHg (ref 83.0–108.0)
pO2, Arterial: 56 mmHg — ABNORMAL LOW (ref 83.0–108.0)

## 2019-06-01 LAB — GLUCOSE, CAPILLARY: Glucose-Capillary: 95 mg/dL (ref 70–99)

## 2019-06-01 MED ORDER — CEPHALEXIN 500 MG PO CAPS
500.0000 mg | ORAL_CAPSULE | Freq: Three times a day (TID) | ORAL | Status: DC
  Administered 2019-06-01 – 2019-06-03 (×6): 500 mg via ORAL
  Filled 2019-06-01 (×9): qty 1

## 2019-06-01 MED ORDER — ENSURE ENLIVE PO LIQD
237.0000 mL | Freq: Three times a day (TID) | ORAL | Status: DC
  Administered 2019-06-01 – 2019-06-03 (×5): 237 mL via ORAL

## 2019-06-01 MED ORDER — CEPHALEXIN 500 MG PO CAPS: 500.0000 mg | ORAL_CAPSULE | Freq: Three times a day (TID) | ORAL | 0 refills | Status: AC

## 2019-06-01 NOTE — Progress Notes (Signed)
Pt placed back on bipap by RN

## 2019-06-01 NOTE — Progress Notes (Addendum)
Writing RN entered room approx 1510 to find patient asleep w/ [place] 3 LPM, no BiPap.  He was very lethargic, responded vaguely to sternal rubs, would not open his eyes,  Replaced bipap and contacted Dr Hilton Sinclair, DC canceled for today, ABG done, CO2 again elevated.  Pt responded well to bipap and came around, A&O x 4, asked for Dutton back.  He was instructed re the importance of always placing his bipap on when he gets sleepy.  Understanding verbalized. Pt questions the meaning of elevated CO2 and how it affects him.  He verbalized understanding of teaching. Talking on the phone with his roommate at this time. Warm compress to left eye and was able to eventually remove most of the dried blood there without causing any further bleeding.

## 2019-06-01 NOTE — Discharge Summary (Addendum)
[facility] [place] [facility]   PATIENT NAME: Chris Case    MR#:  [id number]  DATE OF BIRTH:  February 23, 1971  DATE OF ADMISSION:  05/29/2019 ADMITTING PHYSICIAN: Lorretta Harp, MD  DATE OF DISCHARGE: 06/03/19  PRIMARY CARE PHYSICIAN: [facility]   Needs outpatient Palliative care.  ADMISSION DIAGNOSIS:  Left eyelid laceration, initial encounter [S01.112A] Acute respiratory failure with hypoxia and hypercapnia (HCC) [J96.01, J96.02] Syncope, unspecified syncope type [R55] Acute on chronic respiratory failure with hypoxia and hypercapnia (HCC) [J96.21, J96.22]  DISCHARGE DIAGNOSIS:  Principal Problem:   Acute on chronic respiratory failure with hypoxia and hypercapnia (HCC) Active Problems:   COPD exacerbation (HCC)   Thrombocytopenia (HCC)   Elevated troponin   Syncope   COPD, very severe (HCC)   Pulmonary hypertension (HCC)   Goals of care, counseling/discussion   Palliative care by specialist   DNR (do not resuscitate) discussion   Left eyelid laceration   Acute metabolic encephalopathy   Encounter for hospice care discussion   SECONDARY DIAGNOSIS:   Past Medical History:  Diagnosis Date  . COPD (chronic obstructive pulmonary disease) (HCC)   . Emphysema lung (HCC)   . 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  Acute on chronic hypoxic and hypercapnic respiratory failure.  Patient was in the critical care unit on BiPAP.  The patient chronically wears 3 L of oxygen.  His PCO2 was greater than 120 on a few occasions.  Compliance with BiPAP at home is a must.  I explained this to the patient he must wear his BiPAP when he sleeping or taking a nap during the day or anytime he feels short of breath.  He will not survive if he does not wear his BiPAP. 2.  Very severe COPD on chronic 3 L of oxygen with CO2 retention BiPAP at night, and now daytime too. Continue inhalers. 3.  Pulmonary hypertension.   CT scan commenting on high pulmonary pressures on echocardiogram also shows this. 4.  Elevated troponin secondary to demand ischemia with acute on chronic hypoxic and hypercapnic respiratory failure 5.  Chronic thrombocytopenia 6.  Left eyelid laceration.  Patient turned and it started bleeding a little bit but is clotted.  Advised to leave the clot on.  We will give empiric Keflex. 7.  Patient made a DO NOT RESUSCITATE by palliative care.  Outpatient palliative care consultation.  Addendum:  Pt had to stay and was on bipap overnight , am abg reveiwed with pulmonary and they cleared pt to be discharged home.  DISCHARGE CONDITIONS:   Fair  CONSULTS OBTAINED:  Treatment Team:  [facility], [facility], MD  DRUG ALLERGIES:  No Known Allergies  DISCHARGE MEDICATIONS:   Allergies as of 06/03/2019   No Known Allergies     Medication List    TAKE these medications   albuterol (2.5 MG/3ML) 0.083% nebulizer solution Commonly known as: PROVENTIL Inhale 3 mLs into the lungs every 4 (four) hours as needed for wheezing or shortness of breath.   albuterol 108 (90 Base) MCG/ACT inhaler Commonly known as: VENTOLIN HFA Inhale 2 puffs into the lungs every 4 (four) hours as needed for shortness of breath.   cephALEXin 500 MG capsule Commonly known as: KEFLEX Take 1 capsule (500 mg total) by mouth 3 (three) times daily for 7 days.   mometasone-formoterol 200-5 MCG/ACT Aero Commonly known as: DULERA Inhale 2 puffs into the lungs 2 (two)  times daily.   Spiriva Respimat 1.25 MCG/ACT Aers Generic drug: Tiotropium Bromide Monohydrate Inhale 1 puff into the lungs daily.        DISCHARGE INSTRUCTIONS:   Follow-up PMD 5 days Follow-up Dr. Raul Del 1 week  If you experience worsening of your admission symptoms, develop shortness of breath, life threatening emergency, suicidal or homicidal thoughts you must seek medical attention immediately by calling 911 or calling your MD immediately  if  symptoms less severe.  You Must read complete instructions/literature along with all the possible adverse reactions/side effects for all the Medicines you take and that have been prescribed to you. Take any new Medicines after you have completely understood and accept all the possible adverse reactions/side effects.   Please note  You were cared for by a hospitalist during your hospital stay. If you have any questions about your discharge medications or the care you received while you were in the hospital after you are discharged, you can call the unit and asked to speak with the hospitalist on call if the hospitalist that took care of you is not available. Once you are discharged, your primary care physician will handle any further medical issues. Please note that NO REFILLS for any discharge medications will be authorized once you are discharged, as it is imperative that you return to your primary care physician (or establish a relationship with a primary care physician if you do not have one) for your aftercare needs so that they can reassess your need for medications and monitor your lab values.    Today   CHIEF COMPLAINT:   Chief Complaint  Patient presents with  . Near Syncope    HISTORY OF PRESENT ILLNESS:  Chris Case  is a 49 y.o. male came in with near syncope found to have elevated PCO2   VITAL SIGNS:   Bp 110/79, P 75, RR 19, 3L [place]   PHYSICAL EXAMINATION:  GENERAL:  49 y.o.-year-old patient lying in the bed with no acute distress.  EYES: Pupils equal, round, reactive to light and accommodation. No scleral icterus.  HEENT:  Oropharynx and nasopharynx clear.  LUNGS: Decreased breath sounds bilaterally, no wheezing, rales,rhonchi or crepitation. No use of accessory muscles of respiration.  CARDIOVASCULAR: S1, S2 normal. No murmurs, rubs, or gallops.  ABDOMEN: Soft, non-tender, non-distended. Bowel sounds present. No organomegaly or mass.  EXTREMITIES: No pedal edema,  cyanosis, or clubbing.  NEUROLOGIC: Cranial nerves II through XII are intact. Muscle strength 5/5 in all extremities. PSYCHIATRIC: The patient is alert and oriented x 3.  SKIN: Large clotted blood over left eyelid  DATA REVIEW:   CBC Recent Labs  Lab 05/31/19 0428  WBC 6.6  HGB 9.8*  HCT 32.5*  PLT 92*    Chemistries  Recent Labs  Lab 05/29/19 0357 05/30/19 0405 05/31/19 0428  NA 145   < > 142  K 4.7   < > 4.3  CL 84*   < > 86*  CO2 50*   < > 47*  GLUCOSE 105*   < > 80  BUN 20   < > 16  CREATININE 0.91   < > 0.55*  CALCIUM 8.9   < > 8.3*  MG  --   --  1.9  AST 29  --   --   ALT 20  --   --   ALKPHOS 48  --   --   BILITOT 0.8  --   --    < > = values in this  interval not displayed.    Microbiology Results  Results for orders placed or performed during the hospital encounter of 05/29/19  Respiratory Panel by RT PCR (Flu A&B, Covid) - Nasopharyngeal Swab     Status: None   Collection Time: 05/29/19  5:32 AM   Specimen: Nasopharyngeal Swab  Result Value Ref Range Status   SARS Coronavirus 2 by RT PCR NEGATIVE NEGATIVE Final    Comment: (NOTE) SARS-CoV-2 target nucleic acids are NOT DETECTED. The SARS-CoV-2 RNA is generally detectable in upper respiratoy specimens during the acute phase of infection. The lowest concentration of SARS-CoV-2 viral copies this assay can detect is 131 copies/mL. A negative result does not preclude SARS-Cov-2 infection and should not be used as the sole basis for treatment or other patient management decisions. A negative result may occur with  improper specimen collection/handling, submission of specimen other than nasopharyngeal swab, presence of viral mutation(s) within the areas targeted by this assay, and inadequate number of viral copies (<131 copies/mL). A negative result must be combined with clinical observations, patient history, and epidemiological information. The expected result is Negative. Fact Sheet for Patients:   https://www.moore.com/ Fact Sheet for Healthcare Providers:  https://www.young.biz/ This test is not yet ap proved or cleared by the [place] FDA and  has been authorized for detection and/or diagnosis of SARS-CoV-2 by FDA under an Emergency Use Authorization (EUA). This EUA will remain  in effect (meaning this test can be used) for the duration of the COVID-19 declaration under Section 564(b)(1) of the Act, 21 U.S.C. section 360bbb-3(b)(1), unless the authorization is terminated or revoked sooner.    Influenza A by PCR NEGATIVE NEGATIVE Final   Influenza B by PCR NEGATIVE NEGATIVE Final    Comment: (NOTE) The Xpert Xpress SARS-CoV-2/FLU/RSV assay is intended as an aid in  the diagnosis of influenza from Nasopharyngeal swab specimens and  should not be used as a sole basis for treatment. Nasal washings and  aspirates are unacceptable for Xpert Xpress SARS-CoV-2/FLU/RSV  testing. Fact Sheet for Patients: https://www.moore.com/ Fact Sheet for Healthcare Providers: https://www.young.biz/ This test is not yet approved or cleared by the [place] FDA and  has been authorized for detection and/or diagnosis of SARS-CoV-2 by  FDA under an Emergency Use Authorization (EUA). This EUA will remain  in effect (meaning this test can be used) for the duration of the  Covid-19 declaration under Section 564(b)(1) of the Act, 21  U.S.C. section 360bbb-3(b)(1), unless the authorization is  terminated or revoked. Performed at [facility], [address] [place], [place] [address]   MRSA PCR Screening     Status: None   Collection Time: 05/29/19  3:52 PM   Specimen: Nasopharyngeal  Result Value Ref Range Status   MRSA by PCR NEGATIVE NEGATIVE Final    Comment:        The GeneXpert MRSA Assay (FDA approved for NASAL specimens only), is one component of a comprehensive MRSA colonization surveillance  program. It is not intended to diagnose MRSA infection nor to guide or monitor treatment for MRSA infections. Performed at [facility], [address] [place], [place] [address]       Management plans discussed with the patient, and he is in agreement.  Left message for [name] on the phone  CODE STATUS:     Code Status Orders  (From admission, onward)         Start     Ordered   06/01/19 1126  Do not attempt resuscitation (  DNR)  Continuous    Question Answer Comment  In the event of cardiac or respiratory ARREST Do not call a "code blue"   In the event of cardiac or respiratory ARREST Do not perform Intubation, CPR, defibrillation or ACLS   In the event of cardiac or respiratory ARREST Use medication by any route, position, wound care, and other measures to relive pain and suffering. May use oxygen, suction and manual treatment of airway obstruction as needed for comfort.      06/01/19 1125        Code Status History    Date Active Date Inactive Code Status Order ID Comments User Context   05/29/2019 0910 06/01/2019 1125 Full Code [id number]  Lorretta Harp, MD ED   04/17/2019 0026 04/20/2019 1937 Full Code [id number]  Rometta Emery, MD ED   01/24/2019 1932 01/27/2019 1831 Full Code [id number]  Anselm Jungling, DO ED   12/05/2016 0157 12/15/2016 1800 Full Code [id number]  Ancil Linsey, MD ED   09/24/2015 1353 09/24/2015 1430 Full Code [id number]  Marguarite Arbour, MD Inpatient   Advance Care Planning Activity      TOTAL TIME TAKING CARE OF THIS PATIENT: 34 minutes.    Lynn Ito M.D on 06/03/2019 at 12:43 PM  Between 7am to 6pm - Pager - [phone]  After 6pm go to [email or web address] - Social research officer, government  [facility]  CC: Primary care physician; [facility]

## 2019-06-01 NOTE — Discharge Instructions (Addendum)
You have high PCo2 and Must use bipap at night and naps and anytime with altered mental status.  You will not survive if you do not use your bipap every night.

## 2019-06-01 NOTE — TOC Progression Note (Signed)
Transition of Care [facility]) - Progression Note    Patient Details  Name: Chris Case MRN: [id number] Date of Birth: 1970-07-16  [facility] CM/SW Contact  Liliana Cline, LCSW Phone Number: 06/01/2019, 11:15 AM  Clinical Narrative:   After conversations with Palliative NP, patient has decided to start Outpatient Palliative services. [facility] Representative Clydie Braun is aware of referral.    Expected Discharge Plan: Home/Self Care Barriers to Discharge: Continued Medical Work up  Expected Discharge Plan and Services Expected Discharge Plan: Home/Self Care       Living arrangements for the past 2 months: Single Family Home                   DME Agency: [employer] Date DME Agency Contacted: 05/31/19   Representative spoke with at DME Agency: Mitchell Heir             Social Determinants of Health (SDOH) Interventions    Readmission Risk Interventions No flowsheet data found.

## 2019-06-01 NOTE — Progress Notes (Signed)
CRITICAL CARE PROGRESS NOTE    Name: Chris Case MRN: [id number] DOB: November 09, 1970     LOS: 3   SUBJECTIVE FINDINGS & SIGNIFICANT EVENTS   Patient description:  Chris Case is a 49 y.o. male with medical history significant of COPD on 3 L oxygen, on BiPAP, thrombocytopenia, Chiari malformation, who presents with syncope and AMS. Patient has altered mental status, cannot give accurate medical history.  I called her sister, who is not living with patient, therefore she recommended me to call her roommate.  I called her roommate, who provided most of the medical history.  Per her roommate, patient has history of COPD, supposed to use of BiPAP, but patient is not using BiPAP consistently.  He has shortness breath, cough, but no chest pain.  No fever or chills.  Last night, patient passed out in bathroom, fell, and struck his eye on bathroom sink. Has has a small laceration above left eye which is repaired by EDP. Pt does not have nausea vomiting, diarrhea, abdominal pain, symptoms of UTI.  He moves all extremities.  No facial droop or slurred speech. PCCM consulted for further evaluation and treatment  Lines / Drains: PIVx2   Antibiotics: none   Protocols / Consultants: pccm/hospitalist    PAST MEDICAL HISTORY   Past Medical History:  Diagnosis Date  . COPD (chronic obstructive pulmonary disease) ([place])   . Emphysema lung ([place])   . 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 APPENDECTOMY  late 1980's  . LAPAROTOMY N/A 12/06/2016   Procedure: EXPLORATORY LAPAROTOMY- SBO;  Surgeon: Florene Glen, MD;  Location: [facility] ORS;  Service: General;  Laterality: N/A;     FAMILY HISTORY   Family History  Problem  Relation Age of Onset  . COPD Mother      SOCIAL HISTORY   Social History   Tobacco Use  . Smoking status: Former Smoker    Packs/day: 3.00    Years: 20.00    Pack years: 60.00    Types: Cigarettes    Quit date: 04/11/2016    Years since quitting: 3.1  . Smokeless tobacco: Never Used  Substance Use Topics  . Alcohol use: No    Comment: former drinker-1 pint of gin/week-quit 2015  . Drug use: Yes    Types: Marijuana    Comment: one joint /day x 2 y     MEDICATIONS   Current Medication:  Current Facility-Administered Medications:  .  acetaminophen (TYLENOL) tablet 650 mg, 650 mg, Oral, Q6H PRN **OR** acetaminophen (TYLENOL) suppository 650 mg, 650 mg, Rectal, Q6H PRN, Ivor Costa, MD .  albuterol (PROVENTIL) (2.5 MG/3ML) 0.083% nebulizer solution 2.5 mg, 2.5 mg, Nebulization, Q4H PRN, Ivor Costa, MD .  aspirin EC tablet 81 mg, 81 mg, Oral, Daily, Ivor Costa, MD, 81 mg at 06/01/19 0934 .  Chlorhexidine Gluconate Cloth 2 % PADS 6 each, 6 each, Topical, Q0600, Ivor Costa, MD, 6 each at 05/31/19 2151 .  dextromethorphan-guaiFENesin (MUCINEX DM) 30-600 MG per 12 hr tablet 1 tablet, 1 tablet, Oral, BID, Ivor Costa, MD, 1 tablet at 06/01/19 0934 .  enoxaparin (LOVENOX) injection 40 mg, 40 mg, Subcutaneous, Q24H, Ivor Costa, MD, 40 mg at 05/31/19 2146 .  MEDLINE mouth rinse, 15 mL, Mouth Rinse, BID, Ivor Costa, MD, 15 mL at 06/01/19 0935 .  mometasone-formoterol (DULERA) 200-5 MCG/ACT inhaler 2 puff, 2 puff, Inhalation, BID,  Lorretta Harp, MD, 2 puff at 06/01/19 0935 .  ondansetron (ZOFRAN) tablet 4 mg, 4 mg, Oral, Q6H PRN **OR** ondansetron (ZOFRAN) injection 4 mg, 4 mg, Intravenous, Q6H PRN, Lorretta Harp, MD .  tiotropium (SPIRIVA) inhalation capsule ([facility] use ONLY) 18 mcg, 18 mcg, Inhalation, Daily, Erin Fulling, MD, 18 mcg at 06/01/19 0935    ALLERGIES   Patient has no known allergies.    REVIEW OF SYSTEMS    10 ROS is negative except for hunger  PHYSICAL EXAMINATION    Vital Signs: Temp:  [97.7 F (36.5 C)-99.4 F (37.4 C)] 97.7 F (36.5 C) (03/30 0800) Pulse Rate:  [76-99] 92 (03/30 1100) Resp:  [14-28] 22 (03/30 1100) BP: (102-127)/(63-87) 127/79 (03/30 1100) SpO2:  [85 %-99 %] 99 % (03/30 1100) FiO2 (%):  [30 %-35 %] 30 % (03/30 0800)  GENERAL:NAD HEAD: Normocephalic, atraumatic.  EYES: Pupils equal, round, reactive to light.  No scleral icterus.  MOUTH: Moist mucosal membrane. NECK: Supple. No thyromegaly. No nodules. No JVD.  PULMONARY: mild rhonchi bilaterally CARDIOVASCULAR: S1 and S2. Regular rate and rhythm. No murmurs, rubs, or gallops.  GASTROINTESTINAL: Soft, nontender, non-distended. No masses. Positive bowel sounds. No hepatosplenomegaly.  MUSCULOSKELETAL: No swelling, clubbing, or edema.  NEUROLOGIC: Mild distress due to acute illness SKIN:intact,warm,dry   PERTINENT DATA     Infusions:  Scheduled Medications: . aspirin EC  81 mg Oral Daily  . Chlorhexidine Gluconate Cloth  6 each Topical Q0600  . dextromethorphan-guaiFENesin  1 tablet Oral BID  . enoxaparin (LOVENOX) injection  40 mg Subcutaneous Q24H  . mouth rinse  15 mL Mouth Rinse BID  . mometasone-formoterol  2 puff Inhalation BID  . tiotropium  18 mcg Inhalation Daily   PRN Medications: acetaminophen **OR** acetaminophen, albuterol, ondansetron **OR** ondansetron (ZOFRAN) IV Hemodynamic parameters:   Intake/Output: 03/29 0701 - 03/30 0700 In: 360 [P.O.:360] Out: 950 [Urine:950]  Ventilator  Settings: FiO2 (%):  [30 %-35 %] 30 %   LAB RESULTS:  Basic Metabolic Panel: Recent Labs  Lab 05/29/19 0357 05/29/19 0357 05/30/19 0405 05/31/19 0428  NA 145  --  139 142  K 4.7   < > 4.2 4.3  CL 84*  --  85* 86*  CO2 50*  --  48* 47*  GLUCOSE 105*  --  92 80  BUN 20  --  18 16  CREATININE 0.91  --  0.46* 0.55*  CALCIUM 8.9  --  8.5* 8.3*  MG  --   --   --  1.9  PHOS  --   --   --  3.7   < > = values in this interval not displayed.   Liver Function  Tests: Recent Labs  Lab 05/29/19 0357  AST 29  ALT 20  ALKPHOS 48  BILITOT 0.8  PROT 7.3  ALBUMIN 4.2   No results for input(s): LIPASE, AMYLASE in the last 168 hours. No results for input(s): AMMONIA in the last 168 hours. CBC: Recent Labs  Lab 05/29/19 0118 05/30/19 0405 05/31/19 0428  WBC 8.7 6.7 6.6  NEUTROABS  --   --  4.2  HGB 10.7* 9.6* 9.8*  HCT 35.9* 31.5* 32.5*  MCV 105.3* 101.3* 103.8*  PLT 122* 101* 92*   Cardiac Enzymes: No results for input(s): CKTOTAL, CKMB, CKMBINDEX, TROPONINI in the last 168 hours. BNP: Invalid input(s): POCBNP CBG: Recent Labs  Lab 05/29/19 0121 05/29/19 1549 05/30/19 0727 05/31/19 0706 06/01/19 0813  GLUCAP 138* 108* 118* 127* 95  IMAGING RESULTS:  Imaging: No results found.    ASSESSMENT AND PLAN    -Multidisciplinary rounds held today  Acute hypoxemic and hypercapnic respiratory failure Due to advanced panlobular emphysema and COPD -continue Full NIV support -patient continues to remove BIPAP mask off  - ABG today with severe hypercapnia and acidemia -06/01/19- reviewed ABG with significant impromvent, discussed with Dr Renae Gloss - potential for transfer out of MICU today   GI/Nutrition GI PROPHYLAXIS as indicated DIET-->TF's as tolerated Constipation protocol as indicated  ENDO - ICU hypoglycemic\Hyperglycemia protocol -check FSBS per protocol   ELECTROLYTES -follow labs as needed -replace as needed -pharmacy consultation   DVT/GI PRX ordered -SCDs  TRANSFUSIONS AS NEEDED MONITOR FSBS ASSESS the need for LABS as needed   Critical care provider statement:    Critical care time (minutes):  33   Critical care time was exclusive of:  Separately billable procedures and treating other patients   Critical care was necessary to treat or prevent imminent or life-threatening deterioration of the following conditions:  Acute hypoxemic and hypercapnic respiratory failure   Critical care was time spent  personally by me on the following activities:  Development of treatment plan with patient or surrogate, discussions with consultants, evaluation of patient's response to treatment, examination of patient, obtaining history from patient or surrogate, ordering and performing treatments and interventions, ordering and review of laboratory studies and re-evaluation of patient's condition.  I assumed direction of critical care for this patient from another provider in my specialty: no    This document was prepared using Dragon voice recognition software and may include unintentional dictation errors.    Vida Rigger, M.D.  Division of Pulmonary & Critical Care Medicine  [facility]

## 2019-06-01 NOTE — Progress Notes (Signed)
PT Cancellation Note  Patient Details Name: Chris Case MRN: [id number] DOB: 1971-03-03   Cancelled Treatment:    Reason Eval/Treat Not Completed: Medical issues which prohibited therapy.  Chart reviewed.  Per chart pt placed back on BiPAP this afternoon (pt noted to be barely responsive) and discharge was cancelled; d/t this will hold PT at this time and re-attempt PT treatment session at a later date/time as medically appropriate.   Hendricks Limes, PT 06/01/19, 3:52 PM

## 2019-06-01 NOTE — Progress Notes (Signed)
Palliative: [name] is resting quietly in bed.  He greets me making and mostly keeping eye contact.  He appears chronically ill and somewhat frail.  He is alert and oriented, able to make his basic needs known.  There is no family at bedside at this time.  We talked about healthcare power of attorney.  [name] tells me that he has a nodule daughter, Chris Case, but he is not close with her.  He prefers that his eldest sister, Chris Case, be [name], broadcasting/film/video.  We talked about the importance of having goals of care discussions with Althea present.  I encouraged him to let her know what he does want, what he does not want.  We talked about CODE STATUS.  [name] tells me he would not want to be on life support.  He shares that he sees no need to be on life support, and then have it removed and die.  We talked about "treat the treatable" care, and allowing a natural passing.  We talked about outpatient palliative services to continue these goals of care discussions, and [name] agrees.  Conference with attending, bedside nursing staff, outpatient palliative representative related to patient condition, needs, goals of care.  Plan:   Home with outpatient palliative, now DNR  35 minutes Chris Carmel, NP Palliative Medicine Team Team Phone # [phone] Greater than 50% of this time was spent counseling and coordinating care related to the above assessment and plan.

## 2019-06-01 NOTE — Progress Notes (Signed)
New referral for [employer] community Palliative program to follow post discharge received from Palliative Medicine NP Lillia Carmel. TOC Meagan Hagwood made aware. Patient information given to referral. Thank you. Dayna Barker BSN, RN, [facility] [employer] [phone]

## 2019-06-01 NOTE — TOC Transition Note (Signed)
Transition of Care [facility]) - CM/SW Discharge Note   Patient Details  Name: Chris Case MRN: [id number] Date of Birth: 05-25-1970  Transition of Care [facility]) CM/SW Contact:  Liliana Cline, LCSW Phone Number: 06/01/2019, 12:55 PM   Clinical Narrative:   Patient to discharge home today with Outpatient Palliative Services through [facility]. No other needs identified at this time. Patient reported a friend is providing transportation home. CSW signing off.     Final next level of care: Home/Self Care Barriers to Discharge: Barriers Resolved   Patient Goals and CMS Choice        Discharge Placement                Patient to be transferred to facility by: Friend Name of family member notified: Patient Patient and family notified of of transfer: 06/01/19  Discharge Plan and Services                  DME Agency: [employer] Date DME Agency Contacted: 05/31/19   Representative spoke with at DME Agency: Mitchell Heir            Social Determinants of Health (SDOH) Interventions     Readmission Risk Interventions No flowsheet data found.

## 2019-06-01 NOTE — Progress Notes (Signed)
Patient ID: Chris Case, male   DOB: Dec 14, 1970, 49 y.o.   MRN: [id number] [facility] PROGRESS NOTE  Dory Verdun NGE:[id number] DOB: [birth year] DOA: 05/29/2019 [facility]: [facility]  HPI/Subjective: Just notified that patient is barely responsive.  Asked nurse to get a stat ABG and put him back on the BiPAP.  I will cancel discharge.  Objective: Vitals:   06/01/19 1200 06/01/19 1300  BP: 107/77 115/75  Pulse: 89 84  Resp: 16 16  Temp: 97.7 F (36.5 C)   SpO2: 98% 98%    Intake/Output Summary (Last 24 hours) at 06/01/2019 1518 Last data filed at 06/01/2019 1200 Gross per 24 hour  Intake 590 ml  Output 950 ml  Net -360 ml   Filed Weights   05/29/19 0111 05/29/19 1547  Weight: 56.6 kg 55.2 kg    ROS: Review of Systems  Constitutional: Negative for fever.  Eyes: Negative for blurred vision.  Respiratory: Positive for shortness of breath.   Cardiovascular: Negative for chest pain.  Gastrointestinal: Negative for abdominal pain.  Musculoskeletal: Negative for myalgias.  Neurological: Negative for headaches.  Review of systems done earlier when I saw him. Exam: Physical Exam  HENT:  Nose: No mucosal edema.  Mouth/Throat: No oropharyngeal exudate or posterior oropharyngeal edema.  Eyes: Conjunctivae and lids are normal.  Cardiovascular: S1 normal and S2 normal. Exam reveals no gallop.  No murmur heard. Pulses:      Dorsalis pedis pulses are 2+ on the right side and 2+ on the left side.  Respiratory: No respiratory distress. He has decreased breath sounds in the right lower field and the left lower field. He has no wheezes. He has no rhonchi. He has no rales.  GI: Soft. Bowel sounds are normal. There is no abdominal tenderness.  Musculoskeletal:     Right ankle: No swelling.     Left ankle: No swelling.  Lymphadenopathy:    He has no cervical adenopathy.  Neurological: He is alert. No cranial nerve deficit.  Skin: Skin is warm. No rash noted.  Nails show no clubbing.  Psychiatric: He has a normal mood and affect.      Data Reviewed: Basic Metabolic Panel: Recent Labs  Lab 05/29/19 0357 05/30/19 0405 05/31/19 0428  NA 145 139 142  K 4.7 4.2 4.3  CL 84* 85* 86*  CO2 50* 48* 47*  GLUCOSE 105* 92 80  BUN 20 18 16   CREATININE 0.91 0.46* 0.55*  CALCIUM 8.9 8.5* 8.3*  MG  --   --  1.9  PHOS  --   --  3.7   Liver Function Tests: Recent Labs  Lab 05/29/19 0357  AST 29  ALT 20  ALKPHOS 48  BILITOT 0.8  PROT 7.3  ALBUMIN 4.2   CBC: Recent Labs  Lab 05/29/19 0118 05/30/19 0405 05/31/19 0428  WBC 8.7 6.7 6.6  NEUTROABS  --   --  4.2  HGB 10.7* 9.6* 9.8*  HCT 35.9* 31.5* 32.5*  MCV 105.3* 101.3* 103.8*  PLT 122* 101* 92*   BNP (last 3 results) Recent Labs    04/16/19 1842  BNP 86.0     CBG: Recent Labs  Lab 05/29/19 0121 05/29/19 1549 05/30/19 0727 05/31/19 0706 06/01/19 0813  GLUCAP 138* 108* 118* 127* 95    Recent Results (from the past 240 hour(s))  Respiratory Panel by RT PCR (Flu A&B, Covid) - Nasopharyngeal Swab     Status: None   Collection Time: 05/29/19  5:32 AM   Specimen:  Nasopharyngeal Swab  Result Value Ref Range Status   SARS Coronavirus 2 by RT PCR NEGATIVE NEGATIVE Final    Comment: (NOTE) SARS-CoV-2 target nucleic acids are NOT DETECTED. The SARS-CoV-2 RNA is generally detectable in upper respiratoy specimens during the acute phase of infection. The lowest concentration of SARS-CoV-2 viral copies this assay can detect is 131 copies/mL. A negative result does not preclude SARS-Cov-2 infection and should not be used as the sole basis for treatment or other patient management decisions. A negative result may occur with  improper specimen collection/handling, submission of specimen other than nasopharyngeal swab, presence of viral mutation(s) within the areas targeted by this assay, and inadequate number of viral copies (<131 copies/mL). A negative result must be combined  with clinical observations, patient history, and epidemiological information. The expected result is Negative. Fact Sheet for Patients:  https://www.moore.com/ Fact Sheet for Healthcare Providers:  https://www.young.biz/ This test is not yet ap proved or cleared by the [place] FDA and  has been authorized for detection and/or diagnosis of SARS-CoV-2 by FDA under an Emergency Use Authorization (EUA). This EUA will remain  in effect (meaning this test can be used) for the duration of the COVID-19 declaration under Section 564(b)(1) of the Act, 21 U.S.C. section 360bbb-3(b)(1), unless the authorization is terminated or revoked sooner.    Influenza A by PCR NEGATIVE NEGATIVE Final   Influenza B by PCR NEGATIVE NEGATIVE Final    Comment: (NOTE) The Xpert Xpress SARS-CoV-2/FLU/RSV assay is intended as an aid in  the diagnosis of influenza from Nasopharyngeal swab specimens and  should not be used as a sole basis for treatment. Nasal washings and  aspirates are unacceptable for Xpert Xpress SARS-CoV-2/FLU/RSV  testing. Fact Sheet for Patients: https://www.moore.com/ Fact Sheet for Healthcare Providers: https://www.young.biz/ This test is not yet approved or cleared by the [place] FDA and  has been authorized for detection and/or diagnosis of SARS-CoV-2 by  FDA under an Emergency Use Authorization (EUA). This EUA will remain  in effect (meaning this test can be used) for the duration of the  Covid-19 declaration under Section 564(b)(1) of the Act, 21  U.S.C. section 360bbb-3(b)(1), unless the authorization is  terminated or revoked. Performed at [facility], [address] [place], [place] [address]   MRSA PCR Screening     Status: None   Collection Time: 05/29/19  3:52 PM   Specimen: Nasopharyngeal  Result Value Ref Range Status   MRSA by PCR NEGATIVE NEGATIVE Final    Comment:         The GeneXpert MRSA Assay (FDA approved for NASAL specimens only), is one component of a comprehensive MRSA colonization surveillance program. It is not intended to diagnose MRSA infection nor to guide or monitor treatment for MRSA infections. Performed at [facility], [address] [place], [place] [address]       Scheduled Meds: . aspirin EC  81 mg Oral Daily  . Chlorhexidine Gluconate Cloth  6 each Topical Q0600  . dextromethorphan-guaiFENesin  1 tablet Oral BID  . enoxaparin (LOVENOX) injection  40 mg Subcutaneous Q24H  . mouth rinse  15 mL Mouth Rinse BID  . mometasone-formoterol  2 puff Inhalation BID  . tiotropium  18 mcg Inhalation Daily   Assessment/Plan:  1. Acute metabolic encephalopathy likely CO2 retention again.  Get an ABG and placed back on BiPAP. 2. Acute on chronic hypoxic hypercapnic respiratory failure.  Patient had 2 ABGs with PCO2 greater than 120.  I believe  that he is chronically in the high 80s.  I was hoping to send the patient home today on BiPAP at night and during the day when he is short of breath or altered mental status. 3. Very severe COPD on chronic 3 L of oxygen and BiPAP at night 4. Pulmonary hypertension 5. Elevated troponin 6. Chronic thrombocytopenia 7. Left eyelid laceration p.o. Keflex 8. DNR by palliative care.  Patient will follow with outpatient palliative care  Code Status:     Code Status Orders  (From admission, onward)         Start     Ordered   06/01/19 1126  Do not attempt resuscitation (DNR)  Continuous    Question Answer Comment  In the event of cardiac or respiratory ARREST Do not call a "code blue"   In the event of cardiac or respiratory ARREST Do not perform Intubation, CPR, defibrillation or ACLS   In the event of cardiac or respiratory ARREST Use medication by any route, position, wound care, and other measures to relive pain and suffering. May use oxygen, suction and manual treatment of airway  obstruction as needed for comfort.      06/01/19 1125        Code Status History    Date Active Date Inactive Code Status Order ID Comments User Context   05/29/2019 0910 06/01/2019 1125 Full Code [id number]  Lorretta Harp, MD ED   04/17/2019 0026 04/20/2019 1937 Full Code [id number]  Rometta Emery, MD ED   01/24/2019 1932 01/27/2019 1831 Full Code [id number]  Anselm Jungling, DO ED   12/05/2016 0157 12/15/2016 1800 Full Code [id number]  Ancil Linsey, MD ED   09/24/2015 1353 09/24/2015 1430 Full Code [id number]  Marguarite Arbour, MD Inpatient   Advance Care Planning Activity     Family Communication: gave [name] an update that I am canceling the discharge Disposition Plan: Overall prognosis is poor.  He will likely have to wear BiPAP during the day and at night.  Time spent: 38 minutes, case discussed with palliative care, critical care nursing staff  [facility]  [facility]

## 2019-06-01 NOTE — Progress Notes (Signed)
Patient ID: Chris Case, male   DOB: 1970/12/29, 49 y.o.   MRN: [id number]  [name] called me back and I gave her an update that he can go home today and must wear his BiPAP.  Palliative care will follow as outpatient.  He is now a DO NOT RESUSCITATE.  Dr Alford Highland

## 2019-06-01 NOTE — Progress Notes (Signed)
Initial Nutrition Assessment  DOCUMENTATION CODES:   Severe malnutrition in context of chronic illness  INTERVENTION:  Provide Ensure Enlive po TID, each supplement provides 350 kcal and 20 grams of protein.  Encouraged adequate intake of calories and protein at meals.   NUTRITION DIAGNOSIS:   Severe Malnutrition related to chronic illness(COPD) as evidenced by severe fat depletion, severe muscle depletion.  GOAL:   Patient will meet greater than or equal to 90% of their needs  MONITOR:   PO intake, Supplement acceptance, Labs, Weight trends, I & O's  REASON FOR ASSESSMENT:   Malnutrition Screening Tool    ASSESSMENT:   48 year old male with PMHx of emphysema, COPD, hx TB admitted with acute on chronic hypoxic and hypercapnic respiratory failure.   Met with patient at bedside. He reports he has a good appetite and intake now and at baseline. He is eating 100% of his meals. At home he prepares his meals and reports he eats well. Discussed increased calorie/protein needs in setting of catabolic nature of COPD.  Patient reports he has lost approximately 80 lbs over the past 4 years. Patient is currently 55.2 kg (121.69 lbs).   Medications reviewed.  Labs reviewed.  Discussed on rounds.  NUTRITION - FOCUSED PHYSICAL EXAM:    Most Recent Value  Orbital Region  Severe depletion  Upper Arm Region  Severe depletion  Thoracic and Lumbar Region  Severe depletion  Buccal Region  Severe depletion  Temple Region  Severe depletion  Clavicle Bone Region  Severe depletion  Clavicle and Acromion Bone Region  Severe depletion  Scapular Bone Region  Severe depletion  Dorsal Hand  Moderate depletion  Patellar Region  Severe depletion  Anterior Thigh Region  Severe depletion  Posterior Calf Region  Moderate depletion  Edema (RD Assessment)  -- [non pitting]  Hair  Reviewed  Eyes  Reviewed  Mouth  Reviewed  Skin  Reviewed  Nails  Reviewed     Diet Order:   Diet Order          Diet regular Room service appropriate? Yes; Fluid consistency: Thin  Diet effective now             EDUCATION NEEDS:   No education needs have been identified at this time  Skin:  Skin Assessment: Reviewed RN Assessment  Last BM:  06/01/2019 large type 5  Height:   Ht Readings from Last 1 Encounters:  05/29/19 5' 6" (1.676 m)   Weight:   Wt Readings from Last 1 Encounters:  05/29/19 55.2 kg   Ideal Body Weight:  64.5 kg  BMI:  Body mass index is 19.64 kg/m.  Estimated Nutritional Needs:   Kcal:  1700-1900  Protein:  85-95 grams  Fluid:  1.7-1.9 L/day   King, MS, RD, LDN Pager number available on Amion 

## 2019-06-02 DIAGNOSIS — Z7189 Other specified counseling: Secondary | ICD-10-CM

## 2019-06-02 LAB — BLOOD GAS, ARTERIAL
Acid-Base Excess: 27.8 mmol/L — ABNORMAL HIGH (ref 0.0–2.0)
Bicarbonate: 61.3 mmol/L — ABNORMAL HIGH (ref 20.0–28.0)
FIO2: 0.32
O2 Saturation: 91.8 %
Patient temperature: 37
pCO2 arterial: 119 mmHg (ref 32.0–48.0)
pH, Arterial: 7.32 — ABNORMAL LOW (ref 7.350–7.450)
pO2, Arterial: 68 mmHg — ABNORMAL LOW (ref 83.0–108.0)

## 2019-06-02 LAB — GLUCOSE, CAPILLARY: Glucose-Capillary: 122 mg/dL — ABNORMAL HIGH (ref 70–99)

## 2019-06-02 NOTE — Progress Notes (Signed)
Physical Therapy Treatment Patient Details Name: Chris Case MRN: [id number] DOB: 1970/08/13 Today's Date: 06/02/2019    History of Present Illness Pt is a 49 y.o. male presenting to hospital 3/27 with syncopal episode in bathroom hitting face on sink and sustaining laceration over L eyelid.  Pt admitted with acute on chronic respiratory failure with hypoxia and hypercapnia d/t COPD exacerbation; also syncope.  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    PT Comments    Pt resting in bed upon PT arrival and agreeable to PT session.  Able to sit on edge of bed modified independently and then stand with CGA.  Pt then reported needing to toilet so pt sat back down on bed and BSC brought to pt.  Able to transfer to/from [facility] with CGA (and able to have bowel movement).  After returning from [facility] and sitting on edge of bed pt noted with increased SOB, increased work of breathing, and accessory muscle use for breathing: vc's given for pursed lip breathing but even with time and rest and cueing, pt's breathing did not improve (O2 sats 92% or greater on 3 L O2 via nasal cannula during session).  Pt assisted back to bed and nurse notified immediately and came to assess pt and then placed BiPAP on pt.  Will continue to focus on strengthening and progressive functional mobility per pt tolerance.    Follow Up Recommendations  No PT follow up     Equipment Recommendations  None recommended by PT    Recommendations for Other Services       Precautions / Restrictions Precautions Precautions: Fall Restrictions Weight Bearing Restrictions: No    Mobility  Bed Mobility Overal bed mobility: Modified Independent             General bed mobility comments: Semi-supine to/from sitting without any noted difficulties  Transfers Overall transfer level: Needs assistance Equipment used:  None Transfers: Sit to/from Stand;Stand Pivot Transfers Sit to Stand: Min guard Stand pivot transfers: Min guard       General transfer comment: x2 trials standing from bed; stand step turn bed to/from [facility]  Ambulation/Gait             General Gait Details: Deferred d/t increased SOB and work of breathing after toileting   Stairs             Wheelchair Mobility    Modified Rankin (Stroke Patients Only)       Balance Overall balance assessment: Needs assistance Sitting-balance support: No upper extremity supported;Feet supported Sitting balance-Leahy Scale: Normal Sitting balance - Comments: steady sitting reaching outside BOS   Standing balance support: No upper extremity supported Standing balance-Leahy Scale: Good Standing balance comment: steady standing reaching within BOS and performing toileting hygiene                            Cognition Arousal/Alertness: Awake/alert Behavior During Therapy: WFL for tasks assessed/performed Overall Cognitive Status: Within Functional Limits for tasks assessed                                        Exercises      General Comments   Nursing cleared pt for participation in physical therapy.  Pt agreeable to PT session.  Pertinent Vitals/Pain Pain Assessment: No/denies pain    Home Living                      Prior Function            PT Goals (current goals can now be found in the care plan section) Acute Rehab PT Goals Patient Stated Goal: to go home soon PT Goal Formulation: With patient Time For Goal Achievement: 06/14/19 Potential to Achieve Goals: Fair Progress towards PT goals: Progressing toward goals    Frequency    Min 2X/week      PT Plan Current plan remains appropriate    Co-evaluation              AM-PAC PT "6 Clicks" Mobility   Outcome Measure  Help needed turning from your back to your side while in a flat bed without using  bedrails?: None Help needed moving from lying on your back to sitting on the side of a flat bed without using bedrails?: None Help needed moving to and from a bed to a chair (including a wheelchair)?: A Little Help needed standing up from a chair using your arms (e.g., wheelchair or bedside chair)?: A Little Help needed to walk in hospital room?: A Little Help needed climbing 3-5 steps with a railing? : A Little 6 Click Score: 20    End of Session Equipment Utilized During Treatment: Gait belt;Oxygen(3 L O2 via nasal cannula) Activity Tolerance: Other (comment)(limited d/t increased SOB and work of breathing) Patient left: in bed;with call bell/phone within reach;with bed alarm set Nurse Communication: Mobility status;Precautions;Other (comment)(pt's increased SOB and WOB) PT Visit Diagnosis: Unsteadiness on feet (R26.81);Muscle weakness (generalized) (M62.81);History of falling (Z91.81);Other abnormalities of gait and mobility (R26.89)     Time: 4270-6237 PT Time Calculation (min) (ACUTE ONLY): 38 min  Charges:  $Therapeutic Activity: 38-52 mins                     Leitha Bleak, PT 06/02/19, 1:47 PM

## 2019-06-02 NOTE — Progress Notes (Signed)
Palliative: [name] is sitting up in the bed in a dark room.  He greets me making and somewhat keeping eye contact.  He continues to appear chronically ill and frail.  We talked about his episode of unresponsiveness yesterday.  [name] says he is not sure what happened.  He tells me that he will frequently catnap.  I shared that his CO2 got high, he was unresponsive, so we had the BiPAP placed.  We talked in detail about how he uses BiPAP/trilogy at home.  He tells me that his device is in the main sitting room near his chair where he watches television.  Ask if he uses BiPAP when he sleeps and he tells me that he can, but not necessarily that he does.  He tells me when he is ready for bed he just gets up and goes to his room.  He tells me that he is willing to use the BiPAP, but so far he has not been using as instructed at home.  I ask if his roommate of 4 years, Chris Case, would be willing to place the BiPAP on him if he becomes unresponsive and he tells me he believes that she would.  He tells me that they stay up late (4am) and sleep until around 1pm.    We continue to talk about BiPAP, and how he feels about his breathing.  He does look to have work of breathing to me, but he tells me that he feels like his breathing is okay, normal for him.  We talked about the seriousness of his need for BiPAP and I share, "man, this is life and death for you".  He tells me that he understands, and will do what he must.   We talk about out patient palliative and Hospice care.  [name] is willing to have out patient palliative services and transition to hospice care.  We talk about relationship building with providers.    Conference with CCM, bedside nursing staff, attending related to patient condition, outpatient palliative/hospice services.  Plan: At this point continue to treat the treatable but no CPR, no intubation.  Home with palliative care, transition to hospice when appropriate.     35 minutes Chris Carmel, NP Palliative Medicine Team Team Phone # [phone] Greater than 50% of this time was spent counseling and coordinating care related to the above assessment and plan.

## 2019-06-02 NOTE — Progress Notes (Deleted)
PROGRESS NOTE    Chris Case  ZLD:[id number] DOB: 1970-07-23 DOA: 05/29/2019 PCP: [facility]    Brief Narrative:  Chris Case a 49 y.o.malewith medical history significant ofCOPD on 3 L oxygen,on BiPAP, thrombocytopenia, Chiari malformation, who presents with syncopeand AMS.    Consultants:   PCCM  Procedures:   Antimicrobials:   keflex   Subjective: On bipap,  Aaxox3. No complaints. Objective: Vitals:   06/02/19 1100 06/02/19 1200 06/02/19 1300 06/02/19 1400  BP: 127/88  113/79 120/86  Pulse: 96 99 (!) 106 94  Resp: (!) 23 20 18 18   Temp:    98.5 F (36.9 C)  TempSrc:    Axillary  SpO2: 92% 90% 96% 94%  Weight:      Height:        Intake/Output Summary (Last 24 hours) at 06/02/2019 1622 Last data filed at 06/02/2019 0925 Gross per 24 hour  Intake 240 ml  Output 400 ml  Net -160 ml   Filed Weights   05/29/19 0111 05/29/19 1547  Weight: 56.6 kg 55.2 kg    Examination:  General exam: Appears calm and comfortable on BiPAP Respiratory system: Clear to auscultation.  With increase expiratory time Cardiovascular system: S1 & S2 heard, RRR. No JVD, murmurs, rubs, gallops or clicks.  Gastrointestinal system: Abdomen is nondistended, soft and nontender.  Normal bowel sounds heard. Central nervous system: Alert and oriented.  Grossly intact  extremities: No edema Skin: Warm dry Psychiatry:Mood & affect appropriate in current setting.     Data Reviewed: I have personally reviewed following labs and imaging studies  CBC: Recent Labs  Lab 05/29/19 0118 05/30/19 0405 05/31/19 0428  WBC 8.7 6.7 6.6  NEUTROABS  --   --  4.2  HGB 10.7* 9.6* 9.8*  HCT 35.9* 31.5* 32.5*  MCV 105.3* 101.3* 103.8*  PLT 122* 101* 92*   Basic Metabolic Panel: Recent Labs  Lab 05/29/19 0357 05/30/19 0405 05/31/19 0428  NA 145 139 142  K 4.7 4.2 4.3  CL 84* 85* 86*  CO2 50* 48* 47*  GLUCOSE 105* 92 80  BUN 20 18 16   CREATININE 0.91 0.46*  0.55*  CALCIUM 8.9 8.5* 8.3*  MG  --   --  1.9  PHOS  --   --  3.7   GFR: Estimated Creatinine Clearance: 87.2 mL/min (A) (by C-G formula based on SCr of 0.55 mg/dL (L)). Liver Function Tests: Recent Labs  Lab 05/29/19 0357  AST 29  ALT 20  ALKPHOS 48  BILITOT 0.8  PROT 7.3  ALBUMIN 4.2   No results for input(s): LIPASE, AMYLASE in the last 168 hours. No results for input(s): AMMONIA in the last 168 hours. Coagulation Profile: No results for input(s): INR, PROTIME in the last 168 hours. Cardiac Enzymes: No results for input(s): CKTOTAL, CKMB, CKMBINDEX, TROPONINI in the last 168 hours. BNP (last 3 results) No results for input(s): PROBNP in the last 8760 hours. HbA1C: No results for input(s): HGBA1C in the last 72 hours. CBG: Recent Labs  Lab 05/29/19 1549 05/30/19 0727 05/31/19 0706 06/01/19 0813 06/02/19 0707  GLUCAP 108* 118* 127* 95 122*   Lipid Profile: No results for input(s): CHOL, HDL, LDLCALC, TRIG, CHOLHDL, LDLDIRECT in the last 72 hours. Thyroid Function Tests: No results for input(s): TSH, T4TOTAL, FREET4, T3FREE, THYROIDAB in the last 72 hours. Anemia Panel: No results for input(s): VITAMINB12, FOLATE, FERRITIN, TIBC, IRON, RETICCTPCT in the last 72 hours. Sepsis Labs: No results for input(s): PROCALCITON, LATICACIDVEN in the  last 168 hours.  Recent Results (from the past 240 hour(s))  Respiratory Panel by RT PCR (Flu A&B, Covid) - Nasopharyngeal Swab     Status: None   Collection Time: 05/29/19  5:32 AM   Specimen: Nasopharyngeal Swab  Result Value Ref Range Status   SARS Coronavirus 2 by RT PCR NEGATIVE NEGATIVE Final    Comment: (NOTE) SARS-CoV-2 target nucleic acids are NOT DETECTED. The SARS-CoV-2 RNA is generally detectable in upper respiratoy specimens during the acute phase of infection. The lowest concentration of SARS-CoV-2 viral copies this assay can detect is 131 copies/mL. A negative result does not preclude SARS-Cov-2 infection and  should not be used as the sole basis for treatment or other patient management decisions. A negative result may occur with  improper specimen collection/handling, submission of specimen other than nasopharyngeal swab, presence of viral mutation(s) within the areas targeted by this assay, and inadequate number of viral copies (<131 copies/mL). A negative result must be combined with clinical observations, patient history, and epidemiological information. The expected result is Negative. Fact Sheet for Patients:  https://www.moore.com/ Fact Sheet for Healthcare Providers:  https://www.young.biz/ This test is not yet ap proved or cleared by the [place] FDA and  has been authorized for detection and/or diagnosis of SARS-CoV-2 by FDA under an Emergency Use Authorization (EUA). This EUA will remain  in effect (meaning this test can be used) for the duration of the COVID-19 declaration under Section 564(b)(1) of the Act, 21 U.S.C. section 360bbb-3(b)(1), unless the authorization is terminated or revoked sooner.    Influenza A by PCR NEGATIVE NEGATIVE Final   Influenza B by PCR NEGATIVE NEGATIVE Final    Comment: (NOTE) The Xpert Xpress SARS-CoV-2/FLU/RSV assay is intended as an aid in  the diagnosis of influenza from Nasopharyngeal swab specimens and  should not be used as a sole basis for treatment. Nasal washings and  aspirates are unacceptable for Xpert Xpress SARS-CoV-2/FLU/RSV  testing. Fact Sheet for Patients: https://www.moore.com/ Fact Sheet for Healthcare Providers: https://www.young.biz/ This test is not yet approved or cleared by the [place] FDA and  has been authorized for detection and/or diagnosis of SARS-CoV-2 by  FDA under an Emergency Use Authorization (EUA). This EUA will remain  in effect (meaning this test can be used) for the duration of the  Covid-19 declaration under Section  564(b)(1) of the Act, 21  U.S.C. section 360bbb-3(b)(1), unless the authorization is  terminated or revoked. Performed at [facility], [address] [place], [place] [address]   MRSA PCR Screening     Status: None   Collection Time: 05/29/19  3:52 PM   Specimen: Nasopharyngeal  Result Value Ref Range Status   MRSA by PCR NEGATIVE NEGATIVE Final    Comment:        The GeneXpert MRSA Assay (FDA approved for NASAL specimens only), is one component of a comprehensive MRSA colonization surveillance program. It is not intended to diagnose MRSA infection nor to guide or monitor treatment for MRSA infections. Performed at [facility], [address] [place], [place] [address]          Radiology Studies: No results found.      Scheduled Meds: . aspirin EC  81 mg Oral Daily  . cephALEXin  500 mg Oral Q8H  . Chlorhexidine Gluconate Cloth  6 each Topical Q0600  . dextromethorphan-guaiFENesin  1 tablet Oral BID  . enoxaparin (LOVENOX) injection  40 mg Subcutaneous Q24H  . feeding supplement (ENSURE ENLIVE)  237 mL Oral TID BM  . mouth rinse  15 mL Mouth Rinse BID  . mometasone-formoterol  2 puff Inhalation BID  . tiotropium  18 mcg Inhalation Daily   Continuous Infusions:  Assessment & Plan:   Principal Problem:   Acute on chronic respiratory failure with hypoxia and hypercapnia (HCC) Active Problems:   COPD exacerbation (HCC)   Thrombocytopenia (HCC)   Elevated troponin   Syncope   COPD, very severe (HCC)   Pulmonary hypertension (HCC)   Goals of care, counseling/discussion   Palliative care by specialist   DNR (do not resuscitate) discussion   Left eyelid laceration   Acute metabolic encephalopathy   Encounter for hospice care discussion  1. Acute metabolic encephalopathy likely CO2 retention again.    Improved now on BiPAP.  Unfortunately when he is off BiPAP he may have recurrence of this.   2. Acute on chronic hypoxic hypercapnic  respiratory failure.  Secondary to advanced panlobular emphysema and COPD on BiPAP. ABG revealed CO2 still elevated.  We will keep him here today.  Try to hyperventilate him.  Discussed this with critical care pulmonary attending and will obtain ABG in a.m. prior to discharge.  3. Pulmonary hypertension 4. Elevated troponin-likely demand ischemia 5. Chronic thrombocytopenia 6. Left eyelid laceration p.o. Keflex 7. DNR by palliative care.  Patient will follow with outpatient palliative care    DVT prophylaxis: Lovenox Code Status: DNR Family Communication: None at bedside Disposition Plan: Possible DC in a.m. if his CO2 has decreased and he is mentating well. Barrier still with severe hypercapnia hypoxic respiratory failure.  Needing BiPAP.      LOS: 4 days   Time spent: 45 minutes with more than 50% COC    Lynn Ito, MD [facility] Pager [phone]  If 7PM-7AM, please contact night-coverage [email or web address] Password [facility] 06/02/2019, 4:22 PM

## 2019-06-02 NOTE — Progress Notes (Signed)
CRITICAL CARE PROGRESS NOTE    Name: Chris Case MRN: [id number] DOB: [birth year]     LOS: 4   SUBJECTIVE FINDINGS & SIGNIFICANT EVENTS   Patient description:  Chris Case is a 49 y.o. male with medical history significant of COPD on 3 L oxygen, on BiPAP, thrombocytopenia, Chiari malformation, who presents with syncope and AMS. Patient has altered mental status, cannot give accurate medical history.  I called her sister, who is not living with patient, therefore she recommended me to call her roommate.  I called her roommate, who provided most of the medical history.  Per her roommate, patient has history of COPD, supposed to use of BiPAP, but patient is not using BiPAP consistently.  He has shortness breath, cough, but no chest pain.  No fever or chills.  Last night, patient passed out in bathroom, fell, and struck his eye on bathroom sink. Has has a small laceration above left eye which is repaired by EDP. Pt does not have nausea vomiting, diarrhea, abdominal pain, symptoms of UTI.  He moves all extremities.  No facial droop or slurred speech. PCCM consulted for further evaluation and treatment  Lines / Drains: PIVx2   Antibiotics: none   Protocols / Consultants: pccm/hospitalist  3/31- patient quickly improves with BIPAP and is lucid and coherent but with only few hours off BIPAP he becomes severely hypercapnic as evidenced by serial ABG.  Patient will require daily and nightly NIV and has Trilogy device at home.   PAST MEDICAL HISTORY   Past Medical History:  Diagnosis Date  . COPD (chronic obstructive pulmonary disease) (HCC)   . Emphysema lung (HCC)   . 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  APPENDECTOMY  late 1980's  . LAPAROTOMY N/A 12/06/2016   Procedure: EXPLORATORY LAPAROTOMY- SBO;  Surgeon: [name], MD;  Location: [facility] ORS;  Service: General;  Laterality: N/A;     FAMILY HISTORY   Family History  Problem Relation Age of Onset  . COPD Mother      SOCIAL HISTORY   Social History   Tobacco Use  . Smoking status: Former Smoker    Packs/day: 3.00    Years: 20.00    Pack years: 60.00    Types: Cigarettes    Quit date: 04/11/2016    Years since quitting: 3.1  . Smokeless tobacco: Never Used  Substance Use Topics  . Alcohol use: No    Comment: former drinker-1 pint of gin/week-quit 2015  . Drug use: Yes    Types: Marijuana    Comment: one joint /day x 2 y     MEDICATIONS   Current Medication:  Current Facility-Administered Medications:  .  acetaminophen (TYLENOL) tablet 650 mg, 650 mg, Oral, Q6H PRN **OR** acetaminophen (TYLENOL) suppository 650 mg, 650 mg, Rectal, Q6H PRN, Lorretta Harp, MD .  albuterol (PROVENTIL) (2.5 MG/3ML) 0.083% nebulizer solution 2.5 mg, 2.5 mg, Nebulization, Q4H PRN, Lorretta Harp, MD .  aspirin EC tablet 81 mg, 81 mg, Oral, Daily, Lorretta Harp, MD, 81 mg at 06/02/19 0919 .  cephALEXin (KEFLEX) capsule 500 mg, 500 mg, Oral, Q8H, Wieting, Richard, MD, 500 mg at 06/02/19 0920 .  Chlorhexidine Gluconate Cloth 2 % PADS 6 each, 6 each, Topical, Q0600, Lorretta Harp, MD, 6 each at 06/01/19 2335 .  dextromethorphan-guaiFENesin (MUCINEX DM) 30-600 MG per 12 hr tablet 1 tablet, 1 tablet,  Oral, BID, Lorretta Harp, MD, 1 tablet at 06/02/19 8413 .  enoxaparin (LOVENOX) injection 40 mg, 40 mg, Subcutaneous, Q24H, Lorretta Harp, MD, 40 mg at 06/01/19 2150 .  feeding supplement (ENSURE ENLIVE) (ENSURE ENLIVE) liquid 237 mL, 237 mL, Oral, TID BM, Wieting, Richard, MD, 237 mL at 06/02/19 1352 .  MEDLINE mouth rinse, 15 mL, Mouth Rinse, BID, Lorretta Harp, MD, 15 mL at 06/02/19 0923 .  mometasone-formoterol (DULERA) 200-5 MCG/ACT inhaler 2 puff, 2 puff,  Inhalation, BID, Lorretta Harp, MD, 2 puff at 06/02/19 0920 .  ondansetron (ZOFRAN) tablet 4 mg, 4 mg, Oral, Q6H PRN **OR** ondansetron (ZOFRAN) injection 4 mg, 4 mg, Intravenous, Q6H PRN, Lorretta Harp, MD .  tiotropium (SPIRIVA) inhalation capsule ([facility] use ONLY) 18 mcg, 18 mcg, Inhalation, Daily, Erin Fulling, MD, 18 mcg at 06/02/19 0920    ALLERGIES   Patient has no known allergies.    REVIEW OF SYSTEMS    10 ROS is negative except for hunger  PHYSICAL EXAMINATION   Vital Signs: Temp:  [97.7 F (36.5 C)-98.5 F (36.9 C)] 98.5 F (36.9 C) (03/31 1400) Pulse Rate:  [77-106] 94 (03/31 1400) Resp:  [13-25] 18 (03/31 1400) BP: (96-142)/(70-88) 120/86 (03/31 1400) SpO2:  [90 %-100 %] 94 % (03/31 1400) FiO2 (%):  [35 %] 35 % (03/31 0144)  GENERAL:NAD HEAD: Normocephalic, atraumatic.  EYES: Pupils equal, round, reactive to light.  No scleral icterus.  MOUTH: Moist mucosal membrane. NECK: Supple. No thyromegaly. No nodules. No JVD.  PULMONARY: mild rhonchi bilaterally CARDIOVASCULAR: S1 and S2. Regular rate and rhythm. No murmurs, rubs, or gallops.  GASTROINTESTINAL: Soft, nontender, non-distended. No masses. Positive bowel sounds. No hepatosplenomegaly.  MUSCULOSKELETAL: No swelling, clubbing, or edema.  NEUROLOGIC: Mild distress due to acute illness SKIN:intact,warm,dry   PERTINENT DATA     Infusions:  Scheduled Medications: . aspirin EC  81 mg Oral Daily  . cephALEXin  500 mg Oral Q8H  . Chlorhexidine Gluconate Cloth  6 each Topical Q0600  . dextromethorphan-guaiFENesin  1 tablet Oral BID  . enoxaparin (LOVENOX) injection  40 mg Subcutaneous Q24H  . feeding supplement (ENSURE ENLIVE)  237 mL Oral TID BM  . mouth rinse  15 mL Mouth Rinse BID  . mometasone-formoterol  2 puff Inhalation BID  . tiotropium  18 mcg Inhalation Daily   PRN Medications: acetaminophen **OR** acetaminophen, albuterol, ondansetron **OR** ondansetron (ZOFRAN) IV Hemodynamic parameters:     Intake/Output: 03/30 0701 - 03/31 0700 In: 350 [P.O.:350] Out: 650 [Urine:650]  Ventilator  Settings: FiO2 (%):  [35 %] 35 %   LAB RESULTS:  Basic Metabolic Panel: Recent Labs  Lab 05/29/19 0357 05/29/19 0357 05/30/19 0405 05/31/19 0428  NA 145  --  139 142  K 4.7   < > 4.2 4.3  CL 84*  --  85* 86*  CO2 50*  --  48* 47*  GLUCOSE 105*  --  92 80  BUN 20  --  18 16  CREATININE 0.91  --  0.46* 0.55*  CALCIUM 8.9  --  8.5* 8.3*  MG  --   --   --  1.9  PHOS  --   --   --  3.7   < > = values in this interval not displayed.   Liver Function Tests: Recent Labs  Lab 05/29/19 0357  AST 29  ALT 20  ALKPHOS 48  BILITOT 0.8  PROT 7.3  ALBUMIN 4.2   No results for input(s): LIPASE, AMYLASE in the last 168  hours. No results for input(s): AMMONIA in the last 168 hours. CBC: Recent Labs  Lab 05/29/19 0118 05/30/19 0405 05/31/19 0428  WBC 8.7 6.7 6.6  NEUTROABS  --   --  4.2  HGB 10.7* 9.6* 9.8*  HCT 35.9* 31.5* 32.5*  MCV 105.3* 101.3* 103.8*  PLT 122* 101* 92*   Cardiac Enzymes: No results for input(s): CKTOTAL, CKMB, CKMBINDEX, TROPONINI in the last 168 hours. BNP: Invalid input(s): POCBNP CBG: Recent Labs  Lab 05/29/19 1549 05/30/19 0727 05/31/19 0706 06/01/19 0813 06/02/19 0707  GLUCAP 108* 118* 127* 95 122*     IMAGING RESULTS:  Imaging: No results found.    ASSESSMENT AND PLAN    -Multidisciplinary rounds held today  Acute hypoxemic and hypercapnic respiratory failure Due to advanced panlobular emphysema and COPD -continue Full NIV support -patient continues to remove BIPAP mask off  - ABG today with severe hypercapnia and acidemia -06/01/19- reviewed ABG with significant impromvent, discussed with Dr Leslye Peer - potential for transfer out of MICU today   GI/Nutrition GI PROPHYLAXIS as indicated DIET-->TF's as tolerated Constipation protocol as indicated  ENDO - ICU hypoglycemic\Hyperglycemia protocol -check FSBS per  protocol   ELECTROLYTES -follow labs as needed -replace as needed -pharmacy consultation   DVT/GI PRX ordered -SCDs  TRANSFUSIONS AS NEEDED MONITOR FSBS ASSESS the need for LABS as needed   Critical care provider statement:    Critical care time (minutes):  33   Critical care time was exclusive of:  Separately billable procedures and treating other patients   Critical care was necessary to treat or prevent imminent or life-threatening deterioration of the following conditions:  Acute hypoxemic and hypercapnic respiratory failure   Critical care was time spent personally by me on the following activities:  Development of treatment plan with patient or surrogate, discussions with consultants, evaluation of patient's response to treatment, examination of patient, obtaining history from patient or surrogate, ordering and performing treatments and interventions, ordering and review of laboratory studies and re-evaluation of patient's condition.  I assumed direction of critical care for this patient from another provider in my specialty: no    This document was prepared using Dragon voice recognition software and may include unintentional dictation errors.    Ottie Glazier, M.D.  Division of [facility]

## 2019-06-02 NOTE — TOC Progression Note (Addendum)
Transition of Care [facility]) - Progression Note    Patient Details  Name: Chris Case MRN: [id number] Date of Birth: Aug 26, 1970  Transition of Care [facility]) CM/SW Contact  Liliana Cline, LCSW Phone Number: 06/02/2019, 11:14 AM  Clinical Narrative:   Patient did not discharge yesterday as planned. Updated [facility] Representative Clydie Braun. CSW will continue to follow for any additional needs prior to discharge.     Expected Discharge Plan: Home/Self Care Barriers to Discharge: Barriers Resolved  Expected Discharge Plan and Services Expected Discharge Plan: Home/Self Care       Living arrangements for the past 2 months: Single Family Home Expected Discharge Date: 06/01/19                 DME Agency: [employer] Date DME Agency Contacted: 05/31/19   Representative spoke with at DME Agency: Mitchell Heir             Social Determinants of Health (SDOH) Interventions    Readmission Risk Interventions No flowsheet data found.

## 2019-06-03 LAB — BLOOD GAS, ARTERIAL
Acid-Base Excess: 27.9 mmol/L — ABNORMAL HIGH (ref 0.0–2.0)
Bicarbonate: 61.2 mmol/L — ABNORMAL HIGH (ref 20.0–28.0)
Delivery systems: POSITIVE
Expiratory PAP: 8
FIO2: 0.35
Inspiratory PAP: 16
O2 Saturation: 91.3 %
Patient temperature: 37
RATE: 8 resp/min
pCO2 arterial: 116 mmHg (ref 32.0–48.0)
pH, Arterial: 7.33 — ABNORMAL LOW (ref 7.350–7.450)
pO2, Arterial: 66 mmHg — ABNORMAL LOW (ref 83.0–108.0)

## 2019-06-03 NOTE — TOC Transition Note (Signed)
Transition of Care [facility]) - CM/SW Discharge Note   Patient Details  Name: Chris Case MRN: [id number] Date of Birth: 08-25-1970  Transition of Care [facility]) CM/SW Contact:  Liliana Cline, LCSW Phone Number: 06/03/2019, 12:36 PM   Clinical Narrative:   Sherron Monday with MD. Patient to discharge home today. Patient is aware. CSW called and informed Economist. Per patient, he has a friend to provide transportation home. No other needs identified at this time. CSW signing off.     Final next level of care: Home/Self Care(Outpatient Palliative Services through [facility]) Barriers to Discharge: Barriers Resolved   Patient Goals and CMS Choice        Discharge Placement                Patient to be transferred to facility by: Friend Name of family member notified: Patient - informed by MD Patient and family notified of of transfer: 06/03/19  Discharge Plan and Services                  DME Agency: [employer] Date DME Agency Contacted: 05/31/19   Representative spoke with at DME Agency: Mitchell Heir            Social Determinants of Health (SDOH) Interventions     Readmission Risk Interventions No flowsheet data found.

## 2019-06-03 NOTE — Progress Notes (Addendum)
1630 Discharged home with roommate. Discharge teaching done.Patients DNR paper sent home with patient.

## 2019-06-03 NOTE — Progress Notes (Signed)
CRITICAL CARE PROGRESS NOTE    Name: Chris Case MRN: [id number] DOB: 03/14/1970     LOS: 5   SUBJECTIVE FINDINGS & SIGNIFICANT EVENTS   Patient description:  Chris Case is a 49 y.o. male with medical history significant of COPD on 3 L oxygen, on BiPAP, thrombocytopenia, Chiari malformation, who presents with syncope and AMS. Patient has altered mental status, cannot give accurate medical history.  I called her sister, who is not living with patient, therefore she recommended me to call her roommate.  I called her roommate, who provided most of the medical history.  Per her roommate, patient has history of COPD, supposed to use of BiPAP, but patient is not using BiPAP consistently.  He has shortness breath, cough, but no chest pain.  No fever or chills.  Last night, patient passed out in bathroom, fell, and struck his eye on bathroom sink. Has has a small laceration above left eye which is repaired by EDP. Pt does not have nausea vomiting, diarrhea, abdominal pain, symptoms of UTI.  He moves all extremities.  No facial droop or slurred speech. PCCM consulted for further evaluation and treatment  Lines / Drains: PIVx2   Antibiotics: none   Protocols / Consultants: pccm/hospitalist  3/31- patient quickly improves with BIPAP and is lucid and coherent but with only few hours off BIPAP he becomes severely hypercapnic as evidenced by serial ABG.  Patient will require daily and nightly NIV and has Trilogy device at home.  06/03/19 - patient is lucid alert , eating breakfast.  He wore BIPAP overnight, optimized for downgrade.    PAST MEDICAL HISTORY   Past Medical History:  Diagnosis Date  . COPD (chronic obstructive pulmonary disease) (HCC)   . Emphysema lung (HCC)   . 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 APPENDECTOMY  late 1980's  . LAPAROTOMY N/A 12/06/2016   Procedure: EXPLORATORY LAPAROTOMY- SBO;  Surgeon: [name], MD;  Location: [facility] ORS;  Service: General;  Laterality: N/A;     FAMILY HISTORY   Family History  Problem Relation Age of Onset  . COPD Mother      SOCIAL HISTORY   Social History   Tobacco Use  . Smoking status: Former Smoker    Packs/day: 3.00    Years: 20.00    Pack years: 60.00    Types: Cigarettes    Quit date: 04/11/2016    Years since quitting: 3.1  . Smokeless tobacco: Never Used  Substance Use Topics  . Alcohol use: No    Comment: former drinker-1 pint of gin/week-quit 2015  . Drug use: Yes    Types: Marijuana    Comment: one joint /day x 2 y     MEDICATIONS   Current Medication:  Current Facility-Administered Medications:  .  acetaminophen (TYLENOL) tablet 650 mg, 650 mg, Oral, Q6H PRN **OR** acetaminophen (TYLENOL) suppository 650 mg, 650 mg, Rectal, Q6H PRN, Lorretta Harp, MD .  albuterol (PROVENTIL) (2.5 MG/3ML) 0.083% nebulizer solution 2.5 mg, 2.5 mg, Nebulization, Q4H PRN, Lorretta Harp, MD .  aspirin EC tablet 81 mg, 81 mg, Oral, Daily, Lorretta Harp, MD, 81 mg at 06/02/19 0919 .  cephALEXin (KEFLEX) capsule 500 mg, 500 mg, Oral, Q8H, Wieting, Richard, MD, 500 mg at 06/03/19 0254 .  Chlorhexidine Gluconate Cloth 2 % PADS 6 each, 6 each, Topical, Q0600, Lorretta Harp, MD, 6  each at 06/03/19 0522 .  dextromethorphan-guaiFENesin (MUCINEX DM) 30-600 MG per 12 hr tablet 1 tablet, 1 tablet, Oral, BID, Ivor Costa, MD, 1 tablet at 06/02/19 2205 .  enoxaparin (LOVENOX) injection 40 mg, 40 mg, Subcutaneous, Q24H, Ivor Costa, MD, 40 mg at 06/02/19 2205 .  feeding supplement (ENSURE ENLIVE) (ENSURE ENLIVE) liquid 237 mL, 237 mL, Oral, TID BM, Wieting, Richard, MD, 237 mL at 06/02/19 2011 .  MEDLINE mouth rinse, 15 mL, Mouth Rinse, BID, Ivor Costa, MD, 15 mL  at 06/02/19 2205 .  mometasone-formoterol (DULERA) 200-5 MCG/ACT inhaler 2 puff, 2 puff, Inhalation, BID, Ivor Costa, MD, 2 puff at 06/02/19 2203 .  ondansetron (ZOFRAN) tablet 4 mg, 4 mg, Oral, Q6H PRN **OR** ondansetron (ZOFRAN) injection 4 mg, 4 mg, Intravenous, Q6H PRN, Ivor Costa, MD .  tiotropium (SPIRIVA) inhalation capsule ([facility] use ONLY) 18 mcg, 18 mcg, Inhalation, Daily, Flora Lipps, MD, 18 mcg at 06/02/19 0920    ALLERGIES   Patient has no known allergies.    REVIEW OF SYSTEMS    10 ROS is negative except for hunger  PHYSICAL EXAMINATION   Vital Signs: Temp:  [98.1 F (36.7 C)-98.5 F (36.9 C)] 98.1 F (36.7 C) (04/01 0000) Pulse Rate:  [84-106] 84 (04/01 0200) Resp:  [17-25] 18 (04/01 0200) BP: (107-131)/(60-88) 109/75 (04/01 0200) SpO2:  [90 %-97 %] 96 % (04/01 0200)  GENERAL:NAD HEAD: Normocephalic, atraumatic.  EYES: Pupils equal, round, reactive to light.  No scleral icterus.  MOUTH: Moist mucosal membrane. NECK: Supple. No thyromegaly. No nodules. No JVD.  PULMONARY: mild rhonchi bilaterally CARDIOVASCULAR: S1 and S2. Regular rate and rhythm. No murmurs, rubs, or gallops.  GASTROINTESTINAL: Soft, nontender, non-distended. No masses. Positive bowel sounds. No hepatosplenomegaly.  MUSCULOSKELETAL: No swelling, clubbing, or edema.  NEUROLOGIC: Mild distress due to acute illness SKIN:intact,warm,dry   PERTINENT DATA     Infusions:  Scheduled Medications: . aspirin EC  81 mg Oral Daily  . cephALEXin  500 mg Oral Q8H  . Chlorhexidine Gluconate Cloth  6 each Topical Q0600  . dextromethorphan-guaiFENesin  1 tablet Oral BID  . enoxaparin (LOVENOX) injection  40 mg Subcutaneous Q24H  . feeding supplement (ENSURE ENLIVE)  237 mL Oral TID BM  . mouth rinse  15 mL Mouth Rinse BID  . mometasone-formoterol  2 puff Inhalation BID  . tiotropium  18 mcg Inhalation Daily   PRN Medications: acetaminophen **OR** acetaminophen, albuterol, ondansetron **OR**  ondansetron (ZOFRAN) IV Hemodynamic parameters:   Intake/Output: 03/31 0701 - 04/01 0700 In: 240 [P.O.:240] Out: 1375 [Urine:1375]  Ventilator  Settings:     LAB RESULTS:  Basic Metabolic Panel: Recent Labs  Lab 05/29/19 0357 05/29/19 0357 05/30/19 0405 05/31/19 0428  NA 145  --  139 142  K 4.7   < > 4.2 4.3  CL 84*  --  85* 86*  CO2 50*  --  48* 47*  GLUCOSE 105*  --  92 80  BUN 20  --  18 16  CREATININE 0.91  --  0.46* 0.55*  CALCIUM 8.9  --  8.5* 8.3*  MG  --   --   --  1.9  PHOS  --   --   --  3.7   < > = values in this interval not displayed.   Liver Function Tests: Recent Labs  Lab 05/29/19 0357  AST 29  ALT 20  ALKPHOS 48  BILITOT 0.8  PROT 7.3  ALBUMIN 4.2   No results for input(s): LIPASE, AMYLASE  in the last 168 hours. No results for input(s): AMMONIA in the last 168 hours. CBC: Recent Labs  Lab 05/29/19 0118 05/30/19 0405 05/31/19 0428  WBC 8.7 6.7 6.6  NEUTROABS  --   --  4.2  HGB 10.7* 9.6* 9.8*  HCT 35.9* 31.5* 32.5*  MCV 105.3* 101.3* 103.8*  PLT 122* 101* 92*   Cardiac Enzymes: No results for input(s): CKTOTAL, CKMB, CKMBINDEX, TROPONINI in the last 168 hours. BNP: Invalid input(s): POCBNP CBG: Recent Labs  Lab 05/29/19 1549 05/30/19 0727 05/31/19 0706 06/01/19 0813 06/02/19 0707  GLUCAP 108* 118* 127* 95 122*     IMAGING RESULTS:  Imaging: No results found.    ASSESSMENT AND PLAN    -Multidisciplinary rounds held today  Acute hypoxemic and hypercapnic respiratory failure Due to advanced panlobular emphysema and COPD -continue Full NIV support -patient continues to remove BIPAP mask off  - ABG today with severe hypercapnia and acidemia -06/01/19- reviewed ABG with significant impromvent, discussed with Dr Renae Gloss - potential for transfer out of MICU today   GI/Nutrition GI PROPHYLAXIS as indicated DIET-->TF's as tolerated Constipation protocol as indicated  ENDO - ICU hypoglycemic\Hyperglycemia  protocol -check FSBS per protocol   ELECTROLYTES -follow labs as needed -replace as needed -pharmacy consultation   DVT/GI PRX ordered -SCDs  TRANSFUSIONS AS NEEDED MONITOR FSBS ASSESS the need for LABS as needed   Critical care provider statement:    Critical care time (minutes):  33   Critical care time was exclusive of:  Separately billable procedures and treating other patients   Critical care was necessary to treat or prevent imminent or life-threatening deterioration of the following conditions:  Acute hypoxemic and hypercapnic respiratory failure   Critical care was time spent personally by me on the following activities:  Development of treatment plan with patient or surrogate, discussions with consultants, evaluation of patient's response to treatment, examination of patient, obtaining history from patient or surrogate, ordering and performing treatments and interventions, ordering and review of laboratory studies and re-evaluation of patient's condition.  I assumed direction of critical care for this patient from another provider in my specialty: no    This document was prepared using Dragon voice recognition software and may include unintentional dictation errors.    Vida Rigger, M.D.  Division of Pulmonary & Critical Care Medicine  [facility]

## 2019-06-10 ENCOUNTER — Telehealth: Payer: Self-pay | Admitting: Nurse Practitioner

## 2019-06-10 NOTE — Telephone Encounter (Signed)
Called patient to schedule Palliative Consult, no answer - message left with reason for call along with my name and contact number.

## 2019-06-15 ENCOUNTER — Inpatient Hospital Stay
Admission: EM | Admit: 2019-06-15 | Discharge: 2019-07-29 | DRG: 003 | Disposition: A | Discharge status: Skilled Nursing Facility | Payer: Medicaid Other | Attending: Internal Medicine | Admitting: Internal Medicine

## 2019-06-15 ENCOUNTER — Emergency Department: Payer: Medicaid Other

## 2019-06-15 ENCOUNTER — Inpatient Hospital Stay: Payer: Medicaid Other

## 2019-06-15 ENCOUNTER — Other Ambulatory Visit: Payer: Self-pay

## 2019-06-15 DIAGNOSIS — Z681 Body mass index (BMI) 19 or less, adult: Secondary | ICD-10-CM | POA: Diagnosis not present

## 2019-06-15 DIAGNOSIS — Z825 Family history of asthma and other chronic lower respiratory diseases: Secondary | ICD-10-CM

## 2019-06-15 DIAGNOSIS — L89313 Pressure ulcer of right buttock, stage 3: Secondary | ICD-10-CM | POA: Diagnosis not present

## 2019-06-15 DIAGNOSIS — Z9911 Dependence on respirator [ventilator] status: Secondary | ICD-10-CM | POA: Diagnosis not present

## 2019-06-15 DIAGNOSIS — J9601 Acute respiratory failure with hypoxia: Secondary | ICD-10-CM | POA: Diagnosis not present

## 2019-06-15 DIAGNOSIS — Z7951 Long term (current) use of inhaled steroids: Secondary | ICD-10-CM

## 2019-06-15 DIAGNOSIS — K567 Ileus, unspecified: Secondary | ICD-10-CM | POA: Diagnosis not present

## 2019-06-15 DIAGNOSIS — I2729 Other secondary pulmonary hypertension: Secondary | ICD-10-CM | POA: Diagnosis present

## 2019-06-15 DIAGNOSIS — E875 Hyperkalemia: Secondary | ICD-10-CM | POA: Diagnosis present

## 2019-06-15 DIAGNOSIS — J9600 Acute respiratory failure, unspecified whether with hypoxia or hypercapnia: Secondary | ICD-10-CM

## 2019-06-15 DIAGNOSIS — J9602 Acute respiratory failure with hypercapnia: Secondary | ICD-10-CM | POA: Diagnosis not present

## 2019-06-15 DIAGNOSIS — G9341 Metabolic encephalopathy: Secondary | ICD-10-CM | POA: Diagnosis present

## 2019-06-15 DIAGNOSIS — R569 Unspecified convulsions: Secondary | ICD-10-CM | POA: Diagnosis present

## 2019-06-15 DIAGNOSIS — Z4659 Encounter for fitting and adjustment of other gastrointestinal appliance and device: Secondary | ICD-10-CM

## 2019-06-15 DIAGNOSIS — F1290 Cannabis use, unspecified, uncomplicated: Secondary | ICD-10-CM | POA: Diagnosis present

## 2019-06-15 DIAGNOSIS — I5082 Biventricular heart failure: Secondary | ICD-10-CM | POA: Diagnosis present

## 2019-06-15 DIAGNOSIS — Z01818 Encounter for other preprocedural examination: Secondary | ICD-10-CM

## 2019-06-15 DIAGNOSIS — I5031 Acute diastolic (congestive) heart failure: Secondary | ICD-10-CM | POA: Diagnosis present

## 2019-06-15 DIAGNOSIS — J441 Chronic obstructive pulmonary disease with (acute) exacerbation: Secondary | ICD-10-CM | POA: Diagnosis present

## 2019-06-15 DIAGNOSIS — Z7189 Other specified counseling: Secondary | ICD-10-CM | POA: Diagnosis not present

## 2019-06-15 DIAGNOSIS — R4189 Other symptoms and signs involving cognitive functions and awareness: Secondary | ICD-10-CM | POA: Diagnosis not present

## 2019-06-15 DIAGNOSIS — Z20822 Contact with and (suspected) exposure to covid-19: Secondary | ICD-10-CM | POA: Diagnosis present

## 2019-06-15 DIAGNOSIS — J9622 Acute and chronic respiratory failure with hypercapnia: Secondary | ICD-10-CM | POA: Diagnosis present

## 2019-06-15 DIAGNOSIS — I2781 Cor pulmonale (chronic): Secondary | ICD-10-CM | POA: Diagnosis present

## 2019-06-15 DIAGNOSIS — Z9119 Patient's noncompliance with other medical treatment and regimen: Secondary | ICD-10-CM

## 2019-06-15 DIAGNOSIS — J9621 Acute and chronic respiratory failure with hypoxia: Secondary | ICD-10-CM

## 2019-06-15 DIAGNOSIS — E43 Unspecified severe protein-calorie malnutrition: Secondary | ICD-10-CM | POA: Diagnosis present

## 2019-06-15 DIAGNOSIS — I959 Hypotension, unspecified: Secondary | ICD-10-CM | POA: Diagnosis present

## 2019-06-15 DIAGNOSIS — R0602 Shortness of breath: Secondary | ICD-10-CM

## 2019-06-15 DIAGNOSIS — I248 Other forms of acute ischemic heart disease: Secondary | ICD-10-CM | POA: Diagnosis present

## 2019-06-15 DIAGNOSIS — J449 Chronic obstructive pulmonary disease, unspecified: Secondary | ICD-10-CM | POA: Diagnosis not present

## 2019-06-15 DIAGNOSIS — K660 Peritoneal adhesions (postprocedural) (postinfection): Secondary | ICD-10-CM | POA: Diagnosis present

## 2019-06-15 DIAGNOSIS — Z87891 Personal history of nicotine dependence: Secondary | ICD-10-CM

## 2019-06-15 DIAGNOSIS — Z79899 Other long term (current) drug therapy: Secondary | ICD-10-CM

## 2019-06-15 DIAGNOSIS — E162 Hypoglycemia, unspecified: Secondary | ICD-10-CM | POA: Diagnosis not present

## 2019-06-15 DIAGNOSIS — R109 Unspecified abdominal pain: Secondary | ICD-10-CM

## 2019-06-15 DIAGNOSIS — F419 Anxiety disorder, unspecified: Secondary | ICD-10-CM | POA: Diagnosis present

## 2019-06-15 DIAGNOSIS — J9690 Respiratory failure, unspecified, unspecified whether with hypoxia or hypercapnia: Secondary | ICD-10-CM | POA: Diagnosis present

## 2019-06-15 DIAGNOSIS — Z66 Do not resuscitate: Secondary | ICD-10-CM | POA: Diagnosis present

## 2019-06-15 DIAGNOSIS — R64 Cachexia: Secondary | ICD-10-CM | POA: Diagnosis present

## 2019-06-15 DIAGNOSIS — N179 Acute kidney failure, unspecified: Secondary | ICD-10-CM | POA: Diagnosis present

## 2019-06-15 DIAGNOSIS — R140 Abdominal distension (gaseous): Secondary | ICD-10-CM | POA: Diagnosis not present

## 2019-06-15 DIAGNOSIS — R52 Pain, unspecified: Secondary | ICD-10-CM

## 2019-06-15 DIAGNOSIS — Z431 Encounter for attention to gastrostomy: Secondary | ICD-10-CM | POA: Diagnosis not present

## 2019-06-15 DIAGNOSIS — L8990 Pressure ulcer of unspecified site, unspecified stage: Secondary | ICD-10-CM | POA: Insufficient documentation

## 2019-06-15 DIAGNOSIS — Z0189 Encounter for other specified special examinations: Secondary | ICD-10-CM

## 2019-06-15 DIAGNOSIS — Z515 Encounter for palliative care: Secondary | ICD-10-CM | POA: Diagnosis not present

## 2019-06-15 DIAGNOSIS — Z9981 Dependence on supplemental oxygen: Secondary | ICD-10-CM

## 2019-06-15 DIAGNOSIS — Z931 Gastrostomy status: Secondary | ICD-10-CM

## 2019-06-15 LAB — GLUCOSE, CAPILLARY: Glucose-Capillary: 102 mg/dL — ABNORMAL HIGH (ref 70–99)

## 2019-06-15 LAB — URINE DRUG SCREEN, QUALITATIVE (ARMC ONLY)
Amphetamines, Ur Screen: NOT DETECTED
Barbiturates, Ur Screen: NOT DETECTED
Benzodiazepine, Ur Scrn: NOT DETECTED
Cannabinoid 50 Ng, Ur ~~LOC~~: NOT DETECTED
Cocaine Metabolite,Ur ~~LOC~~: NOT DETECTED
MDMA (Ecstasy)Ur Screen: NOT DETECTED
Methadone Scn, Ur: NOT DETECTED
Opiate, Ur Screen: NOT DETECTED
Phencyclidine (PCP) Ur S: NOT DETECTED
Tricyclic, Ur Screen: NOT DETECTED

## 2019-06-15 LAB — BLOOD GAS, ARTERIAL
Acid-Base Excess: 16.4 mmol/L — ABNORMAL HIGH (ref 0.0–2.0)
Bicarbonate: 48.1 mmol/L — ABNORMAL HIGH (ref 20.0–28.0)
FIO2: 35
MECHVT: 450 mL
Mechanical Rate: 25
O2 Saturation: 96.3 %
PEEP: 5 cmH2O
Patient temperature: 37
pCO2 arterial: 100 mmHg (ref 32.0–48.0)
pH, Arterial: 7.29 — ABNORMAL LOW (ref 7.350–7.450)
pO2, Arterial: 93 mmHg (ref 83.0–108.0)

## 2019-06-15 LAB — CBC WITH DIFFERENTIAL/PLATELET
Abs Immature Granulocytes: 0.06 10*3/uL (ref 0.00–0.07)
Basophils Absolute: 0 10*3/uL (ref 0.0–0.1)
Basophils Relative: 0 %
Eosinophils Absolute: 0 10*3/uL (ref 0.0–0.5)
Eosinophils Relative: 0 %
HCT: 41.1 % (ref 39.0–52.0)
Hemoglobin: 12.2 g/dL — ABNORMAL LOW (ref 13.0–17.0)
Immature Granulocytes: 1 %
Lymphocytes Relative: 5 %
Lymphs Abs: 0.6 10*3/uL — ABNORMAL LOW (ref 0.7–4.0)
MCH: 31 pg (ref 26.0–34.0)
MCHC: 29.7 g/dL — ABNORMAL LOW (ref 30.0–36.0)
MCV: 104.6 fL — ABNORMAL HIGH (ref 80.0–100.0)
Monocytes Absolute: 0.8 10*3/uL (ref 0.1–1.0)
Monocytes Relative: 8 %
Neutro Abs: 9.1 10*3/uL — ABNORMAL HIGH (ref 1.7–7.7)
Neutrophils Relative %: 86 %
Platelets: 120 10*3/uL — ABNORMAL LOW (ref 150–400)
RBC: 3.93 MIL/uL — ABNORMAL LOW (ref 4.22–5.81)
RDW: 13.4 % (ref 11.5–15.5)
WBC: 10.5 10*3/uL (ref 4.0–10.5)
nRBC: 0 % (ref 0.0–0.2)

## 2019-06-15 LAB — COMPREHENSIVE METABOLIC PANEL
ALT: 16 U/L (ref 0–44)
AST: 29 U/L (ref 15–41)
Albumin: 4.3 g/dL (ref 3.5–5.0)
Alkaline Phosphatase: 50 U/L (ref 38–126)
Anion gap: 9 (ref 5–15)
BUN: 32 mg/dL — ABNORMAL HIGH (ref 6–20)
CO2: 48 mmol/L — ABNORMAL HIGH (ref 22–32)
Calcium: 9.1 mg/dL (ref 8.9–10.3)
Chloride: 86 mmol/L — ABNORMAL LOW (ref 98–111)
Creatinine, Ser: 0.69 mg/dL (ref 0.61–1.24)
GFR calc Af Amer: >60 mL/min (ref >60)
GFR calc non Af Amer: >60 mL/min (ref >60)
Glucose, Bld: 140 mg/dL — ABNORMAL HIGH (ref 70–99)
Potassium: 5.2 mmol/L — ABNORMAL HIGH (ref 3.5–5.1)
Sodium: 143 mmol/L (ref 135–145)
Total Bilirubin: 1.2 mg/dL (ref 0.3–1.2)
Total Protein: 7.7 g/dL (ref 6.5–8.1)

## 2019-06-15 LAB — URINALYSIS, COMPLETE (UACMP) WITH MICROSCOPIC
Bilirubin Urine: NEGATIVE
Glucose, UA: NEGATIVE mg/dL
Hgb urine dipstick: NEGATIVE
Ketones, ur: 5 mg/dL — AB
Leukocytes,Ua: NEGATIVE
Nitrite: NEGATIVE
Protein, ur: 100 mg/dL — AB
Specific Gravity, Urine: 1.021 (ref 1.005–1.030)
pH: 5 (ref 5.0–8.0)

## 2019-06-15 LAB — ETHANOL: Alcohol, Ethyl (B): <10 mg/dL (ref <10)

## 2019-06-15 LAB — MRSA PCR SCREENING: MRSA by PCR: NEGATIVE

## 2019-06-15 LAB — AMMONIA: Ammonia: 17 umol/L (ref 9–35)

## 2019-06-15 LAB — PROTIME-INR
INR: 1 (ref 0.8–1.2)
Prothrombin Time: 13.4 seconds (ref 11.4–15.2)

## 2019-06-15 LAB — RESPIRATORY PANEL BY RT PCR (FLU A&B, COVID)
Influenza A by PCR: NEGATIVE
Influenza B by PCR: NEGATIVE
SARS Coronavirus 2 by RT PCR: NEGATIVE

## 2019-06-15 LAB — PROCALCITONIN: Procalcitonin: <0.1 ng/mL

## 2019-06-15 LAB — TROPONIN I (HIGH SENSITIVITY)
Troponin I (High Sensitivity): 61 ng/L — ABNORMAL HIGH (ref <18)
Troponin I (High Sensitivity): 85 ng/L — ABNORMAL HIGH (ref <18)

## 2019-06-15 LAB — LACTIC ACID, PLASMA: Lactic Acid, Venous: 1.2 mmol/L (ref 0.5–1.9)

## 2019-06-15 MED ORDER — SODIUM CHLORIDE 0.9 % IV BOLUS
1000.0000 mL | Freq: Once | INTRAVENOUS | Status: AC
  Administered 2019-06-15: 1000 mL via INTRAVENOUS

## 2019-06-15 MED ORDER — CHLORHEXIDINE GLUCONATE CLOTH 2 % EX PADS
6.0000 | MEDICATED_PAD | Freq: Every day | CUTANEOUS | Status: DC
  Administered 2019-06-15 – 2019-07-28 (×37): 6 via TOPICAL

## 2019-06-15 MED ORDER — LORAZEPAM 2 MG/ML IJ SOLN
INTRAMUSCULAR | Status: AC
  Filled 2019-06-15: qty 1

## 2019-06-15 MED ORDER — IPRATROPIUM-ALBUTEROL 0.5-2.5 (3) MG/3ML IN SOLN: 3.0000 mL | Freq: Once | RESPIRATORY_TRACT | Status: AC

## 2019-06-15 MED ORDER — SUCCINYLCHOLINE CHLORIDE 20 MG/ML IJ SOLN
150.0000 mg | Freq: Once | INTRAMUSCULAR | Status: AC
  Administered 2019-06-15: 150 mg via INTRAVENOUS

## 2019-06-15 MED ORDER — METHYLPREDNISOLONE SODIUM SUCC 125 MG IJ SOLR
INTRAMUSCULAR | Status: AC
  Administered 2019-06-15: 125 mg via INTRAVENOUS
  Filled 2019-06-15: qty 2

## 2019-06-15 MED ORDER — POLYETHYLENE GLYCOL 3350 17 G PO PACK
17.0000 g | PACK | Freq: Every day | ORAL | Status: DC
  Administered 2019-06-16 – 2019-06-17 (×2): 17 g via ORAL
  Filled 2019-06-15 (×2): qty 1

## 2019-06-15 MED ORDER — METHYLPREDNISOLONE SODIUM SUCC 125 MG IJ SOLR: 125.0000 mg | Freq: Once | INTRAMUSCULAR | Status: AC

## 2019-06-15 MED ORDER — ACETAMINOPHEN 325 MG PO TABS
650.0000 mg | ORAL_TABLET | ORAL | Status: DC | PRN
  Administered 2019-06-16 – 2019-06-30 (×2): 650 mg
  Filled 2019-06-15 (×2): qty 2

## 2019-06-15 MED ORDER — PROPOFOL 1000 MG/100ML IV EMUL
INTRAVENOUS | Status: AC
  Administered 2019-06-15: 10 mg via INTRAVENOUS
  Filled 2019-06-15: qty 100

## 2019-06-15 MED ORDER — ENOXAPARIN SODIUM 40 MG/0.4ML ~~LOC~~ SOLN
40.0000 mg | SUBCUTANEOUS | Status: DC
  Administered 2019-06-15: 40 mg via SUBCUTANEOUS
  Filled 2019-06-15: qty 0.4

## 2019-06-15 MED ORDER — LORAZEPAM 2 MG/ML IJ SOLN: 1.0000 mg | Freq: Once | INTRAMUSCULAR | Status: AC

## 2019-06-15 MED ORDER — IPRATROPIUM-ALBUTEROL 0.5-2.5 (3) MG/3ML IN SOLN
RESPIRATORY_TRACT | Status: AC
  Administered 2019-06-15: 3 mL via RESPIRATORY_TRACT
  Filled 2019-06-15: qty 9

## 2019-06-15 MED ORDER — IPRATROPIUM-ALBUTEROL 0.5-2.5 (3) MG/3ML IN SOLN
3.0000 mL | Freq: Four times a day (QID) | RESPIRATORY_TRACT | Status: DC | PRN
  Administered 2019-06-27 – 2019-07-03 (×6): 3 mL via RESPIRATORY_TRACT
  Filled 2019-06-15 (×3): qty 3

## 2019-06-15 MED ORDER — ORAL CARE MOUTH RINSE
15.0000 mL | OROMUCOSAL | Status: DC
  Administered 2019-06-15 – 2019-06-28 (×115): 15 mL via OROMUCOSAL

## 2019-06-15 MED ORDER — METHYLPREDNISOLONE SODIUM SUCC 40 MG IJ SOLR
40.0000 mg | Freq: Two times a day (BID) | INTRAMUSCULAR | Status: DC
  Administered 2019-06-15 – 2019-06-17 (×4): 40 mg via INTRAVENOUS
  Filled 2019-06-15 (×4): qty 1

## 2019-06-15 MED ORDER — ETOMIDATE 2 MG/ML IV SOLN
20.0000 mg | Freq: Once | INTRAVENOUS | Status: AC
  Administered 2019-06-15: 20 mg via INTRAVENOUS

## 2019-06-15 MED ORDER — PROPOFOL 1000 MG/100ML IV EMUL
5.0000 ug/kg/min | INTRAVENOUS | Status: DC
  Administered 2019-06-15: 12:00:00 15 ug/kg/min via INTRAVENOUS
  Administered 2019-06-16: 40 ug/kg/min via INTRAVENOUS
  Administered 2019-06-16: 20 ug/kg/min via INTRAVENOUS
  Filled 2019-06-15 (×3): qty 100

## 2019-06-15 MED ORDER — FENTANYL CITRATE (PF) 100 MCG/2ML IJ SOLN
50.0000 ug | INTRAMUSCULAR | Status: DC | PRN
  Administered 2019-06-16: 20:00:00 50 ug via INTRAVENOUS
  Administered 2019-06-16 – 2019-06-17 (×8): 100 ug via INTRAVENOUS
  Administered 2019-06-17: 50 ug via INTRAVENOUS
  Administered 2019-06-18 – 2019-06-27 (×6): 100 ug via INTRAVENOUS
  Administered 2019-06-28 – 2019-06-30 (×7): 50 ug via INTRAVENOUS
  Administered 2019-06-30: 100 ug via INTRAVENOUS
  Administered 2019-06-30: 50 ug via INTRAVENOUS
  Administered 2019-07-01: 100 ug via INTRAVENOUS
  Filled 2019-06-15 (×26): qty 2

## 2019-06-15 MED ORDER — IPRATROPIUM-ALBUTEROL 0.5-2.5 (3) MG/3ML IN SOLN
3.0000 mL | RESPIRATORY_TRACT | Status: DC
  Administered 2019-06-15 – 2019-06-28 (×76): 3 mL via RESPIRATORY_TRACT
  Filled 2019-06-15 (×80): qty 3

## 2019-06-15 MED ORDER — ACETAMINOPHEN 325 MG PO TABS: 650.0000 mg | ORAL_TABLET | ORAL | Status: DC | PRN

## 2019-06-15 MED ORDER — FAMOTIDINE IN NACL 20-0.9 MG/50ML-% IV SOLN
20.0000 mg | Freq: Two times a day (BID) | INTRAVENOUS | Status: DC
  Administered 2019-06-15 – 2019-06-29 (×29): 20 mg via INTRAVENOUS
  Filled 2019-06-15 (×29): qty 50

## 2019-06-15 MED ORDER — FENTANYL CITRATE (PF) 100 MCG/2ML IJ SOLN
50.0000 ug | INTRAMUSCULAR | Status: AC | PRN
  Administered 2019-06-15 – 2019-06-16 (×3): 50 ug via INTRAVENOUS
  Filled 2019-06-15 (×2): qty 2

## 2019-06-15 MED ORDER — LORAZEPAM 2 MG/ML IJ SOLN
1.0000 mg | Freq: Once | INTRAMUSCULAR | Status: AC
  Administered 2019-06-15: 1 mg via INTRAVENOUS

## 2019-06-15 MED ORDER — DOCUSATE SODIUM 50 MG/5ML PO LIQD
100.0000 mg | Freq: Two times a day (BID) | ORAL | Status: DC
  Administered 2019-06-15 – 2019-06-29 (×18): 100 mg via ORAL
  Filled 2019-06-15 (×21): qty 10

## 2019-06-15 MED ORDER — CHLORHEXIDINE GLUCONATE 0.12% ORAL RINSE (MEDLINE KIT)
15.0000 mL | Freq: Two times a day (BID) | OROMUCOSAL | Status: DC
  Administered 2019-06-15 – 2019-06-28 (×24): 15 mL via OROMUCOSAL

## 2019-06-15 MED ORDER — LORAZEPAM 2 MG/ML IJ SOLN
INTRAMUSCULAR | Status: AC
  Administered 2019-06-15: 1 mg via INTRAVENOUS
  Filled 2019-06-15: qty 1

## 2019-06-15 NOTE — Progress Notes (Signed)
Patient was transported to ct without incident. Et tube was advanced from 23 cm to 25cm per Dr Dolores Frame without incident. Tolerated well

## 2019-06-15 NOTE — Progress Notes (Signed)
Request for advance directive support per RN due to sister calling in asking to be HPOA.   Chaplain called Chris Case, sister who shared she desires to be Chris Case's HPOA as she is the oldest in the family and has the the best relationship with him (she reports she has always been his contact person during hospitalizations). She shared he has a daughter, Chris Case who has been out of the picture for some time and the relationship is fractured. During chaplain's call with the sister... the daughter called the sister, the sister provided an update to the daughter (both live in [place]). Chris Case is also in contact with Chris Case, [employer] roommate and they work well together. Chaplain provided Chris Case education on end of life and the role of Palliative Care and Hospice. She is aware of hospice due to her mother's death in Jun 18, 2006. Chris Case appreciates updates while recognizing her own anxiety interferes with her ability to be at bedside. She does desire to see her brother and has support to bring her to see him. She is working on arrangements to visit tomorrow 4.14.2021.     06/15/19 2000  Clinical Encounter Type  Visited With Family  Visit Type Follow-up  Referral From Chaplain

## 2019-06-15 NOTE — H&P (Addendum)
Chris Case is an 49 y.o. male.    Chief Complaint: Found to be unresponsive brought in via EMS HPI: Patient is a 49 year old former smoker with a history very severe COPD stage IV, who presented via EMS after being found down and unresponsive.  Apparently the patient's mother placed a call to EMS.  The patient has a longstanding history of COPD and noncompliance with his oxygen and CPAP.  History could not be obtained from the patient as there was no family available and the patient had been intubated and was mechanically ventilated in the ED.  Of note he had recently been admitted to [facility] 29 May 2019 through 03 June 2019.  He had been evaluated by palliative care during this admission and actually had a DNR order.  I have reviewed the records from his most recent admission.  Patient also had a 2D echo that showed significant right ventricular overload and pulmonary hypertension consistent with cor pulmonale.  We are asked to admit the patient as he has been intubated and is now mechanically ventilated.  As noted no family is present.  I have reviewed the laboratory data and it confirms acute on chronic hypercarbic and hypoxic respiratory failure.  Past Medical History:  Diagnosis Date  . COPD (chronic obstructive pulmonary disease) (HCC)   . Emphysema lung (HCC)   . 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 APPENDECTOMY  late 1980's  . LAPAROTOMY N/A 12/06/2016   Procedure: EXPLORATORY LAPAROTOMY- SBO;  Surgeon: [name], MD;  Location: [facility] ORS;  Service: General;  Laterality: N/A;    Family History  Problem Relation Age of Onset  . COPD Mother    Social History:  reports that he quit smoking about 3 years ago. His smoking use included cigarettes. He has a 60.00 pack-year smoking history. He has never used smokeless tobacco. He reports current drug use. Drug: Marijuana. He reports that he does not drink  alcohol.  Allergies: No Known Allergies  (Not in a hospital admission)   Results for orders placed or performed during the hospital encounter of 06/15/19 (from the past 48 hour(s))  Lactic acid, plasma     Status: None   Collection Time: 06/15/19  5:00 AM  Result Value Ref Range   Lactic Acid, Venous 1.2 0.5 - 1.9 mmol/L    Comment: Performed at [facility], [address] [place], [place] [address]  CBC with Differential     Status: Abnormal   Collection Time: 06/15/19  5:00 AM  Result Value Ref Range   WBC 10.5 4.0 - 10.5 K/uL   RBC 3.93 (L) 4.22 - 5.81 MIL/uL   Hemoglobin 12.2 (L) 13.0 - 17.0 g/dL   HCT 60.4 54.0 - 98.1 %   MCV 104.6 (H) 80.0 - 100.0 fL   MCH 31.0 26.0 - 34.0 pg   MCHC 29.7 (L) 30.0 - 36.0 g/dL   RDW 19.1 47.8 - 29.5 %   Platelets 120 (L) 150 - 400 K/uL    Comment: Immature Platelet Fraction may be clinically indicated, consider ordering this additional test [id number]    nRBC 0.0 0.0 - 0.2 %   Neutrophils Relative % 86 %   Neutro Abs 9.1 (H) 1.7 - 7.7 K/uL   Lymphocytes Relative 5 %   Lymphs Abs 0.6 (L) 0.7 - 4.0 K/uL   Monocytes Relative 8 %   Monocytes Absolute 0.8 0.1 -  1.0 K/uL   Eosinophils Relative 0 %   Eosinophils Absolute 0.0 0.0 - 0.5 K/uL   Basophils Relative 0 %   Basophils Absolute 0.0 0.0 - 0.1 K/uL   Immature Granulocytes 1 %   Abs Immature Granulocytes 0.06 0.00 - 0.07 K/uL    Comment: Performed at [facility], [address] [place], [place] [address]  Comprehensive metabolic panel     Status: Abnormal   Collection Time: 06/15/19  5:00 AM  Result Value Ref Range   Sodium 143 135 - 145 mmol/L   Potassium 5.2 (H) 3.5 - 5.1 mmol/L   Chloride 86 (L) 98 - 111 mmol/L   CO2 48 (H) 22 - 32 mmol/L   Glucose, Bld 140 (H) 70 - 99 mg/dL    Comment: Glucose reference range applies only to samples taken after fasting for at least 8 hours.   BUN 32 (H) 6 - 20 mg/dL   Creatinine, Ser 6.54 0.61 - 1.24 mg/dL   Calcium  9.1 8.9 - 65.0 mg/dL   Total Protein 7.7 6.5 - 8.1 g/dL   Albumin 4.3 3.5 - 5.0 g/dL   AST 29 15 - 41 U/L   ALT 16 0 - 44 U/L   Alkaline Phosphatase 50 38 - 126 U/L   Total Bilirubin 1.2 0.3 - 1.2 mg/dL   GFR calc non Af Amer >60 >60 mL/min   GFR calc Af Amer >60 >60 mL/min   Anion gap 9 5 - 15    Comment: Performed at [facility], [address] [place], [place] [address]  Ethanol     Status: None   Collection Time: 06/15/19  5:00 AM  Result Value Ref Range   Alcohol, Ethyl (B) <10 <10 mg/dL    Comment: (NOTE) Lowest detectable limit for serum alcohol is 10 mg/dL. For medical purposes only. Performed at [facility], [address] [place], [place] [address]   Troponin I (High Sensitivity)     Status: Abnormal   Collection Time: 06/15/19  5:00 AM  Result Value Ref Range   Troponin I (High Sensitivity) 61 (H) <18 ng/L    Comment: (NOTE) Elevated high sensitivity troponin I (hsTnI) values and significant  changes across serial measurements may suggest ACS but many other  chronic and acute conditions are known to elevate hsTnI results.  Refer to the "Links" section for chest pain algorithms and additional  guidance. Performed at [facility], [address] [place], [place] [address]   Urinalysis, Complete w Microscopic     Status: Abnormal   Collection Time: 06/15/19  5:00 AM  Result Value Ref Range   Color, Urine YELLOW (A) YELLOW   APPearance HAZY (A) CLEAR   Specific Gravity, Urine 1.021 1.005 - 1.030   pH 5.0 5.0 - 8.0   Glucose, UA NEGATIVE NEGATIVE mg/dL   Hgb urine dipstick NEGATIVE NEGATIVE   Bilirubin Urine NEGATIVE NEGATIVE   Ketones, ur 5 (A) NEGATIVE mg/dL   Protein, ur 174 (A) NEGATIVE mg/dL   Nitrite NEGATIVE NEGATIVE   Leukocytes,Ua NEGATIVE NEGATIVE   RBC / HPF 11-20 0 - 5 RBC/hpf   WBC, UA 0-5 0 - 5 WBC/hpf   Bacteria, UA RARE (A) NONE SEEN   Squamous Epithelial / LPF 0-5 0 - 5   Mucus PRESENT    Hyaline Casts, UA  PRESENT     Comment: Performed at [facility], [address], [place], [place] [address]  Urine Drug Screen, Qualitative  Status: None   Collection Time: 06/15/19  5:00 AM  Result Value Ref Range   Tricyclic, Ur Screen NONE DETECTED NONE DETECTED   Amphetamines, Ur Screen NONE DETECTED NONE DETECTED   MDMA (Ecstasy)Ur Screen NONE DETECTED NONE DETECTED   Cocaine Metabolite,Ur [place] NONE DETECTED NONE DETECTED   Opiate, Ur Screen NONE DETECTED NONE DETECTED   Phencyclidine (PCP) Ur S NONE DETECTED NONE DETECTED   Cannabinoid 50 Ng, Ur [place] NONE DETECTED NONE DETECTED   Barbiturates, Ur Screen NONE DETECTED NONE DETECTED   Benzodiazepine, Ur Scrn NONE DETECTED NONE DETECTED   Methadone Scn, Ur NONE DETECTED NONE DETECTED    Comment: (NOTE) Tricyclics + metabolites, urine    Cutoff 1000 ng/mL Amphetamines + metabolites, urine  Cutoff 1000 ng/mL MDMA (Ecstasy), urine              Cutoff 500 ng/mL Cocaine Metabolite, urine          Cutoff 300 ng/mL Opiate + metabolites, urine        Cutoff 300 ng/mL Phencyclidine (PCP), urine         Cutoff 25 ng/mL Cannabinoid, urine                 Cutoff 50 ng/mL Barbiturates + metabolites, urine  Cutoff 200 ng/mL Benzodiazepine, urine              Cutoff 200 ng/mL Methadone, urine                   Cutoff 300 ng/mL The urine drug screen provides only a preliminary, unconfirmed analytical test result and should not be used for non-medical purposes. Clinical consideration and professional judgment should be applied to any positive drug screen result due to possible interfering substances. A more specific alternate chemical method must be used in order to obtain a confirmed analytical result. Gas chromatography / mass spectrometry (GC/MS) is the preferred confirmat ory method. Performed at [facility], [address], [place], [place] [address]   Respiratory Panel by RT PCR (Flu A&B, Covid) - Nasopharyngeal Swab     Status: None     Collection Time: 06/15/19  5:00 AM   Specimen: Nasopharyngeal Swab  Result Value Ref Range   SARS Coronavirus 2 by RT PCR NEGATIVE NEGATIVE    Comment: (NOTE) SARS-CoV-2 target nucleic acids are NOT DETECTED. The SARS-CoV-2 RNA is generally detectable in upper respiratoy specimens during the acute phase of infection. The lowest concentration of SARS-CoV-2 viral copies this assay can detect is 131 copies/mL. A negative result does not preclude SARS-Cov-2 infection and should not be used as the sole basis for treatment or other patient management decisions. A negative result may occur with  improper specimen collection/handling, submission of specimen other than nasopharyngeal swab, presence of viral mutation(s) within the areas targeted by this assay, and inadequate number of viral copies (<131 copies/mL). A negative result must be combined with clinical observations, patient history, and epidemiological information. The expected result is Negative. Fact Sheet for Patients:  https://www.moore.com/ Fact Sheet for Healthcare Providers:  https://www.young.biz/ This test is not yet ap proved or cleared by the [place] FDA and  has been authorized for detection and/or diagnosis of SARS-CoV-2 by FDA under an Emergency Use Authorization (EUA). This EUA will remain  in effect (meaning this test can be used) for the duration of the COVID-19 declaration under Section 564(b)(1) of the Act, 21 U.S.C. section 360bbb-3(b)(1), unless the authorization is terminated or revoked sooner.  Influenza A by PCR NEGATIVE NEGATIVE   Influenza B by PCR NEGATIVE NEGATIVE    Comment: (NOTE) The Xpert Xpress SARS-CoV-2/FLU/RSV assay is intended as an aid in  the diagnosis of influenza from Nasopharyngeal swab specimens and  should not be used as a sole basis for treatment. Nasal washings and  aspirates are unacceptable for Xpert Xpress SARS-CoV-2/FLU/RSV   testing. Fact Sheet for Patients: PinkCheek.be Fact Sheet for Healthcare Providers: GravelBags.it This test is not yet approved or cleared by the [place] FDA and  has been authorized for detection and/or diagnosis of SARS-CoV-2 by  FDA under an Emergency Use Authorization (EUA). This EUA will remain  in effect (meaning this test can be used) for the duration of the  Covid-19 declaration under Section 564(b)(1) of the Act, 21  U.S.C. section 360bbb-3(b)(1), unless the authorization is  terminated or revoked. Performed at [facility], [address] [place], [place] [address]   Procalcitonin - Baseline     Status: None   Collection Time: 06/15/19  5:00 AM  Result Value Ref Range   Procalcitonin <0.10 ng/mL    Comment:        Interpretation: PCT (Procalcitonin) <= 0.5 ng/mL: Systemic infection (sepsis) is not likely. Local bacterial infection is possible. (NOTE)       Sepsis PCT Algorithm           Lower Respiratory Tract                                      Infection PCT Algorithm    ----------------------------     ----------------------------         PCT < 0.25 ng/mL                PCT < 0.10 ng/mL         Strongly encourage             Strongly discourage   discontinuation of antibiotics    initiation of antibiotics    ----------------------------     -----------------------------       PCT 0.25 - 0.50 ng/mL            PCT 0.10 - 0.25 ng/mL               OR       >80% decrease in PCT            Discourage initiation of                                            antibiotics      Encourage discontinuation           of antibiotics    ----------------------------     -----------------------------         PCT >= 0.50 ng/mL              PCT 0.26 - 0.50 ng/mL               AND        <80% decrease in PCT             Encourage initiation of  antibiotics        Encourage continuation           of antibiotics    ----------------------------     -----------------------------        PCT >= 0.50 ng/mL                  PCT > 0.50 ng/mL               AND         increase in PCT                  Strongly encourage                                      initiation of antibiotics    Strongly encourage escalation           of antibiotics                                     -----------------------------                                           PCT <= 0.25 ng/mL                                                 OR                                        > 80% decrease in PCT                                     Discontinue / Do not initiate                                             antibiotics Performed at [facility], [address] [place], [place] [address]   Blood gas, arterial     Status: Abnormal   Collection Time: 06/15/19  5:45 AM  Result Value Ref Range   FIO2 35.00    Mode ASSIST CONTROL    VT 450 mL   Peep/cpap 5.0 cm H20   pH, Arterial 7.29 (L) 7.350 - 7.450   pCO2 arterial 100 (HH) 32.0 - 48.0 mmHg    Comment: CRITICAL RESULT CALLED TO, READ BACK BY AND VERIFIED WITH: DR Dolores Frame [id number]    pO2, Arterial 93 83.0 - 108.0 mmHg   Bicarbonate 48.1 (H) 20.0 - 28.0 mmol/L   Acid-Base Excess 16.4 (H) 0.0 - 2.0 mmol/L   O2 Saturation 96.3 %   Patient temperature 37.0    Collection site RIGHT RADIAL    Sample type ARTERIAL DRAW    Allens test (pass/fail) PASS PASS   Mechanical Rate 25     Comment: Performed at [facility], [address] [place], [place] [address]  Ammonia     Status: None   Collection Time: 06/15/19  6:03 AM  Result Value Ref Range   Ammonia 17 9 - 35 umol/L    Comment: Performed at [facility], [address] [place], [place] [address]  Troponin I (High Sensitivity)     Status: Abnormal   Collection Time: 06/15/19  7:19 AM  Result Value Ref Range   Troponin I (High  Sensitivity) 85 (H) <18 ng/L    Comment: READ BACK AND VERIFIED WITH LAURA CATES AT 0800 ON 06/15/2019 [facility]. (NOTE) Elevated high sensitivity troponin I (hsTnI) values and significant  changes across serial measurements may suggest ACS but many other  chronic and acute conditions are known to elevate hsTnI results.  Refer to the "Links" section for chest pain algorithms and additional  guidance. Performed at [facility], [address] [place], [place] [address]   Protime-INR     Status: None   Collection Time: 06/15/19  7:19 AM  Result Value Ref Range   Prothrombin Time 13.4 11.4 - 15.2 seconds   INR 1.0 0.8 - 1.2    Comment: (NOTE) INR goal varies based on device and disease states. Performed at [facility], [address] [place], [place] [address]    DG Abdomen 1 View  Result Date: 06/15/2019 CLINICAL DATA:  Orogastric tube placement EXAM: ABDOMEN - 1 VIEW COMPARISON:  Portable exam 0752 hours compared to 12/06/2016 FINDINGS: Tip of nasogastric tube projects over mid stomach. Lungs appear emphysematous but clear. Visualized bowel gas pattern normal with scattered gas and stool in colon. Bones demineralized. IMPRESSION: Tip of nasogastric tube projects over mid stomach. COPD changes without basilar infiltrate. Electronically Signed   By: Ulyses SouthwardMark  Boles M.D.   On: 06/15/2019 08:15   CT Head Wo Contrast  Result Date: 06/15/2019 CLINICAL DATA:  Altered mental status EXAM: CT HEAD WITHOUT CONTRAST TECHNIQUE: Contiguous axial images were obtained from the base of the skull through the vertex without intravenous contrast. COMPARISON:  05/29/2019 FINDINGS: Brain: No evidence of acute infarction, hemorrhage, hydrocephalus, extra-axial collection or mass lesion/mass effect. Low cerebellar tonsils but no foramen magnum stenosis. Vascular: No hyperdense vessel or unexpected calcification. Skull: Normal. Negative for fracture or focal lesion. Sinuses/Orbits: No acute finding.  IMPRESSION: No acute finding or interval change. Electronically Signed   By: Marnee SpringJonathon  Watts M.D.   On: 06/15/2019 05:53   DG Chest Port 1 View  Result Date: 06/15/2019 CLINICAL DATA:  Status post intubation. EXAM: PORTABLE CHEST 1 VIEW COMPARISON:  CT 05/29/2019.  Chest x-ray 05/29/2019. FINDINGS: Endotracheal tube noted with tip 5 cm above the carina. Heart size normal. No focal infiltrate. Tiny right pleural effusion. No pneumothorax. IMPRESSION: 1.  Endotracheal tube noted with tip 5 cm above the carina. 2.  No focal infiltrate.  Tiny right pleural effusion. Electronically Signed   By: Maisie Fushomas  Register   On: 06/15/2019 05:43    Review of Systems  Unable to perform ROS: Intubated    Blood pressure 94/78, pulse 75, temperature 98.6 F (37 C), resp. rate (!) 25, height 5\' 6"  (1.676 m), weight 55.2 kg, SpO2 100 %. Physical Exam   GENERAL: Very thin male, temporal wasting noted, obtunded on the ventilator. HEAD: Normocephalic, atraumatic.  EYES: Pupils midpoint equal, round, sluggishly reactive to light.  No scleral icterus.  MOUTH: Orotracheally intubated.  OG in place.  Oral mucosa moist. NECK: Supple. No thyromegaly. No nodules.  JVD  present.  Trachea midline PULMONARY: Very distant breath sounds with very distant end expiratory faint wheezes.  I to E ratio 1:4 CARDIOVASCULAR: S1 and S2. Regular rate and rhythm.  No murmurs heard. GASTROINTESTINAL: Abdomen is soft, nondistended, normoactive bowel sounds. MUSCULOSKELETAL: No joint deformity,no edema, clubbing about the fingers.  NEUROLOGIC: Unresponsive on the ventilator. SKIN: Intact,warm,dry.  No rashes noted. PSYCH: Unable to assess, patient on the ventilator.   Chest x-ray independently reviewed, there is hyperinflation, significant bullous disease consistent with COPD, cardiomegaly.  ET tube in place.  As below:     Assessment/Plan  Acute on chronic hypercarbic/hypoxic respiratory failure COPD exacerbation No evidence of  active infection Continue ventilator support Wake up assessment and SBT in the morning if meets criteria Reassess goals of care Bronchodilators: DuoNeb every 4 and as needed IV corticosteroids No indication for antibiotics at present  Severe COPD stage IV with exacerbation Noted noncompliance with oxygen/BiPAP at home Bronchodilators as above IV corticosteroids Reassess goals of care Palliative care consultation  Acute encephalopathy Unresponsiveness Due to hypercarbia Monitor, reassess CT head negative  Modestly elevated troponin Likely due to demand ischemia Decompensation of cor pulmonale Trend troponins Supportive care  Cor pulmonale with decompensation Pulmonary hypertension Management of issues as above Poor prognostic sign  Acute kidney injury  Avoid nephrotoxins Volume resuscitate  Hyperkalemia Likely triggered by acidosis Monitor  ID Procalcitonin normal No infiltrate on chest x-ray No indication for antibiotics at present Continue to trend WBC/fever curve  Prophylaxis: Lovenox Famotidine   Critical care time: 60 minutes   C. Danice Goltz, MD [facility] 06/15/2019, 9:21 AM  *This note was dictated using voice recognition software/Dragon.  Despite best efforts to proofread, errors can occur which can change the meaning.  Any change was purely unintentional.

## 2019-06-15 NOTE — ED Provider Notes (Signed)
[facility] Emergency Department Provider Note   ____________________________________________   First MD Initiated Contact with Patient 06/15/19 [phone]     (approximate)  I have reviewed the triage vital signs and the nursing notes.   HISTORY  Chief Complaint Other (unresponsive)  Level V caveat: Limited by unresponsiveness  HPI Chris Case is a 49 y.o. male brought to the ED via EMS from home with a chief complaint of respiratory failure and unresponsiveness.  Patient has a history of COPD, noncompliance with his oxygen and CPAP, who was found unresponsive by his mother this morning.  Arrives to the ED under BVM.  Rest of history is limited secondary to patient's unresponsive state.       Past Medical History:  Diagnosis Date  . COPD (chronic obstructive pulmonary disease) ([place])   . Emphysema lung ([place])   . 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 Encounter for hospice care discussion   . Left eyelid laceration   . Acute metabolic encephalopathy   . Pulmonary hypertension ([place])   . Goals of care, counseling/discussion   . Palliative care by specialist   . DNR (do not resuscitate) discussion   . COPD, very severe ([place])   . Elevated troponin 05/29/2019  . Syncope 05/29/2019  . Acute on chronic respiratory failure with hypoxemia ([place]) 04/16/2019  . Acute respiratory failure with hypoxia ([place]) 01/24/2019  . Thrombocytopenia ([place]) 01/24/2019  . Protein-calorie malnutrition, severe 12/10/2016  . Intestinal adhesions with complete obstruction ([place]) 12/05/2016  . Constipation   . SBO (small bowel obstruction) ([place])   . Acute bronchitis 09/26/2015  . COPD exacerbation ([place]) 09/24/2015  . Acute on chronic respiratory failure with hypoxia and hypercapnia ([place]) 09/24/2015  . Abnormal ECG 09/24/2015  . Edema, lower extremity 09/24/2015    Past Surgical History:  Procedure  Laterality Date  . APPENDECTOMY  late 1980's  . LAPAROTOMY N/A 12/06/2016   Procedure: EXPLORATORY LAPAROTOMY- SBO;  Surgeon: Florene Glen, MD;  Location: [facility] ORS;  Service: General;  Laterality: N/A;    Prior to Admission medications   Medication Sig Start Date End Date Taking? Authorizing Provider  albuterol (PROVENTIL) (2.5 MG/3ML) 0.083% nebulizer solution Inhale 3 mLs into the lungs every 4 (four) hours as needed for wheezing or shortness of breath. 09/26/15  Yes Theodoro Grist, MD  albuterol (VENTOLIN HFA) 108 (90 Base) MCG/ACT inhaler Inhale 2 puffs into the lungs every 4 (four) hours as needed for shortness of breath. 01/09/19  Yes [provider]  mometasone-formoterol (DULERA) 200-5 MCG/ACT AERO Inhale 2 puffs into the lungs 2 (two) times daily. 09/26/15  Yes Theodoro Grist, MD  Tiotropium Bromide Monohydrate (SPIRIVA RESPIMAT) 1.25 MCG/ACT AERS Inhale 1 puff into the lungs daily.    Yes [provider]    Allergies Patient has no known allergies.  Family History  Problem Relation Age of Onset  . COPD Mother     Social History Social History   Tobacco Use  . Smoking status: Former Smoker    Packs/day: 3.00    Years: 20.00    Pack years: 60.00    Types: Cigarettes    Quit date: 04/11/2016    Years since quitting: 3.1  . Smokeless tobacco: Never Used  Substance Use Topics  . Alcohol use: No    Comment: former drinker-1 pint of gin/week-quit 2015  . Drug use: Yes  Types: Marijuana    Comment: one joint /day x 2 y    Review of Systems  Constitutional: Positive for unresponsiveness.  No fever/chills Eyes: No visual changes. ENT: No sore throat. Cardiovascular: Denies chest pain. Respiratory: Positive for shortness of breath. Gastrointestinal: No abdominal pain.  No nausea, no vomiting.  No diarrhea.  No constipation. Genitourinary: Negative for dysuria. Musculoskeletal: Negative for back pain. Skin: Negative for rash. Neurological:  Negative for headaches, focal weakness or numbness.   ____________________________________________   PHYSICAL EXAM:  VITAL SIGNS: ED Triage Vitals  Enc Vitals Group     BP 06/15/19 0458 130/85     Pulse Rate 06/15/19 0458 (!) 113     Resp 06/15/19 0458 (!) 28     Temp --      Temp src --      SpO2 06/15/19 0457 95 %     Weight 06/15/19 0501 121 lb 11.1 oz (55.2 kg)     Height 06/15/19 0501 5\' 6"  (1.676 m)     Head Circumference --      Peak Flow --      Pain Score 06/15/19 0500 Asleep     Pain Loc --      Pain Edu? --      Excl. in GC? --     Constitutional: Unresponsive.  Cachectic appearing. Eyes: Conjunctivae are normal. PERRL. EOMI. Head: Atraumatic. Nose: Atraumatic. Mouth/Throat: Mucous membranes are dry.   Neck: No stridor.  No step-offs or deformities noted. Cardiovascular: Normal rate, regular rhythm. Grossly normal heart sounds.  Good peripheral circulation. Respiratory: BVM. Gastrointestinal: Soft and nontender. No distention. No abdominal bruits. No CVA tenderness. Musculoskeletal: No lower extremity tenderness nor edema.  No joint effusions. Neurologic: Unresponsive.   Skin:  Skin is warm, dry and intact. No rash noted. Psychiatric: Unable to assess.  ____________________________________________   LABS (all labs ordered are listed, but only abnormal results are displayed)  Labs Reviewed  CBC WITH DIFFERENTIAL/PLATELET - Abnormal; Notable for the following components:      Result Value   RBC 3.93 (*)    Hemoglobin 12.2 (*)    MCV 104.6 (*)    MCHC 29.7 (*)    Platelets 120 (*)    Neutro Abs 9.1 (*)    Lymphs Abs 0.6 (*)    All other components within normal limits  COMPREHENSIVE METABOLIC PANEL - Abnormal; Notable for the following components:   Potassium 5.2 (*)    Chloride 86 (*)    CO2 48 (*)    Glucose, Bld 140 (*)    BUN 32 (*)    All other components within normal limits  BLOOD GAS, ARTERIAL - Abnormal; Notable for the following  components:   pH, Arterial 7.29 (*)    pCO2 arterial 100 (*)    Bicarbonate 48.1 (*)    Acid-Base Excess 16.4 (*)    All other components within normal limits  TROPONIN I (HIGH SENSITIVITY) - Abnormal; Notable for the following components:   Troponin I (High Sensitivity) 61 (*)    All other components within normal limits  RESPIRATORY PANEL BY RT PCR (FLU A&B, COVID)  CULTURE, BLOOD (ROUTINE X 2)  CULTURE, BLOOD (ROUTINE X 2)  LACTIC ACID, PLASMA  ETHANOL  LACTIC ACID, PLASMA  URINALYSIS, COMPLETE (UACMP) WITH MICROSCOPIC  URINE DRUG SCREEN, QUALITATIVE ([facility] ONLY)  PROTIME-INR  AMMONIA   ____________________________________________  EKG  ED ECG REPORT I, Rey Fors J, the attending physician, personally viewed and interpreted this ECG.  Date: 06/15/2019  EKG Time: 0455  Rate: 92  Rhythm: normal EKG, normal sinus rhythm  Axis: Normal  Intervals:none  ST&T Change: T wave depression, anterior ischemia  ____________________________________________  RADIOLOGY  ED MD interpretation: No focal infiltrate,: No ICH  Official radiology report(s): CT Head Wo Contrast  Result Date: 06/15/2019 CLINICAL DATA:  Altered mental status EXAM: CT HEAD WITHOUT CONTRAST TECHNIQUE: Contiguous axial images were obtained from the base of the skull through the vertex without intravenous contrast. COMPARISON:  05/29/2019 FINDINGS: Brain: No evidence of acute infarction, hemorrhage, hydrocephalus, extra-axial collection or mass lesion/mass effect. Low cerebellar tonsils but no foramen magnum stenosis. Vascular: No hyperdense vessel or unexpected calcification. Skull: Normal. Negative for fracture or focal lesion. Sinuses/Orbits: No acute finding. IMPRESSION: No acute finding or interval change. Electronically Signed   By: Marnee Spring M.D.   On: 06/15/2019 05:53   DG Chest Port 1 View  Result Date: 06/15/2019 CLINICAL DATA:  Status post intubation. EXAM: PORTABLE CHEST 1 VIEW COMPARISON:  CT  05/29/2019.  Chest x-ray 05/29/2019. FINDINGS: Endotracheal tube noted with tip 5 cm above the carina. Heart size normal. No focal infiltrate. Tiny right pleural effusion. No pneumothorax. IMPRESSION: 1.  Endotracheal tube noted with tip 5 cm above the carina. 2.  No focal infiltrate.  Tiny right pleural effusion. Electronically Signed   By: Maisie Fus  Register   On: 06/15/2019 05:43    ____________________________________________   PROCEDURES  Procedure(s) performed (including Critical Care):  Procedure Name: Intubation Date/Time: 06/15/2019 5:09 AM Performed by: Irean Hong, MD Pre-anesthesia Checklist: Patient identified, Patient being monitored, Emergency Drugs available, Timeout performed and Suction available Oxygen Delivery Method: Ambu bag Preoxygenation: Pre-oxygenation with 100% oxygen Induction Type: Rapid sequence Ventilation: Mask ventilation without difficulty Laryngoscope Size: Glidescope and 3 Tube size: 7.5 mm Number of attempts: 1 Airway Equipment and Method: Rigid stylet Placement Confirmation: ETT inserted through vocal cords under direct vision,  CO2 detector and Breath sounds checked- equal and bilateral    .1-3 Lead EKG Interpretation Performed by: Irean Hong, MD Authorized by: Irean Hong, MD     Interpretation: abnormal     ECG rate:  112   ECG rate assessment: tachycardic     Rhythm: sinus tachycardia     Ectopy: none     Conduction: normal   Comments:     Patient placed on cardiac monitor to monitor for arrhythmias   CRITICAL CARE Performed by: Irean Hong   Total critical care time: 45 minutes  Critical care time was exclusive of separately billable procedures and treating other patients.  Critical care was necessary to treat or prevent imminent or life-threatening deterioration.  Critical care was time spent personally by me on the following activities: development of treatment plan with patient and/or surrogate as well as nursing,  discussions with consultants, evaluation of patient's response to treatment, examination of patient, obtaining history from patient or surrogate, ordering and performing treatments and interventions, ordering and review of laboratory studies, ordering and review of radiographic studies, pulse oximetry and re-evaluation of patient's condition.   ____________________________________________   INITIAL IMPRESSION / ASSESSMENT AND PLAN / ED COURSE  As part of my medical decision making, I reviewed the following data within the electronic MEDICAL RECORD NUMBER Nursing notes reviewed and incorporated, Labs reviewed, EKG interpreted, Old chart reviewed, Radiograph reviewed, Discussed with admitting physician and Notes from prior ED visits     Taavi Hoose was evaluated in Emergency Department on 06/15/2019 for the symptoms described in  the history of present illness. He was evaluated in the context of the global COVID-19 pandemic, which necessitated consideration that the patient might be at risk for infection with the SARS-CoV-2 virus that causes COVID-19. Institutional protocols and algorithms that pertain to the evaluation of patients at risk for COVID-19 are in a state of rapid change based on information released by regulatory bodies including the CDC and federal and state organizations. These policies and algorithms were followed during the patient's care in the ED.    49 year old male with a history of COPD, hypercarbia and medical noncompliance presenting with unresponsive state. Differential includes, but is not limited to, viral syndrome, bronchitis including COPD exacerbation, pneumonia, reactive airway disease including asthma, CHF including exacerbation with or without pulmonary/interstitial edema, pneumothorax, ACS, thoracic trauma, and pulmonary embolism.  Patient emergently intubated for respiratory failure.  Will obtain sepsis lab work, chest x-ray.  Start Solu-Medrol, duo nebs.  Patient is  hypotensive; initiate IV fluid resuscitation.  Will discuss with CCU for admission.   Clinical Course as of Jun 15 623  Tue Jun 15, 2019  0509 Patient seen to be having tonic-clonic movement in his right upper extremity.  1 mg IV Ativan given.  Will obtain CT head.   [JS]  0550 Will ask RT to advance ETT 3cm.   [JS]  0610 Blood pressure improving.  IV fluids infusing.  On propofol drip.  CT head is negative for intracranial hemorrhage.  Awaiting callback from CCU for admission.   [JS]  8295 Discussed case with CCU NP Emelia Loron who will admit patient to CCU.   [JS]    Clinical Course User Index [JS] Irean Hong, MD     ____________________________________________   FINAL CLINICAL IMPRESSION(S) / ED DIAGNOSES  Final diagnoses:  Acute on chronic respiratory failure with hypoxia and hypercapnia (HCC)  Hypotension, unspecified hypotension type  Seizure [facility])     ED Discharge Orders    None       Note:  This document was prepared using Dragon voice recognition software and may include unintentional dictation errors.   Irean Hong, MD 06/15/19 [phone]

## 2019-06-15 NOTE — ED Notes (Signed)
Attempted to return call to patient's sister without success. Will try again at a later time.

## 2019-06-15 NOTE — ED Triage Notes (Signed)
[facility] unresponsive. Pt was found by mother unresponsive, has hx of CO2 buildup and LOC. Hx COPD.  Pt was 50% room air, normally wears 3L [place]. Improved to 80s w 3L, then non rebreather due to shallow, rapid respirations (40s), EMS began assisting respirations w bag mask.  BS 162 18 G L AC

## 2019-06-15 NOTE — ED Notes (Signed)
Spoke with patient's sister regarding patient's status and plan of care. Informed her that he should be moving to the ICU shortly. She requests updates as needed and states if she is not able to be reached, Glorianne Manchester, who is patient's roommate, should be contacted.

## 2019-06-15 NOTE — ED Notes (Signed)
Seizure activity noted, pt R arm contracted and tremors to R arm and face. MD Dolores Frame notified, see Ativan orders in eMAR.

## 2019-06-16 ENCOUNTER — Telehealth: Payer: Self-pay | Admitting: Nurse Practitioner

## 2019-06-16 DIAGNOSIS — J441 Chronic obstructive pulmonary disease with (acute) exacerbation: Principal | ICD-10-CM

## 2019-06-16 DIAGNOSIS — R4189 Other symptoms and signs involving cognitive functions and awareness: Secondary | ICD-10-CM | POA: Diagnosis not present

## 2019-06-16 LAB — BASIC METABOLIC PANEL
Anion gap: 17 — ABNORMAL HIGH (ref 5–15)
BUN: 28 mg/dL — ABNORMAL HIGH (ref 6–20)
CO2: 37 mmol/L — ABNORMAL HIGH (ref 22–32)
Calcium: 9.1 mg/dL (ref 8.9–10.3)
Chloride: 88 mmol/L — ABNORMAL LOW (ref 98–111)
Creatinine, Ser: 0.81 mg/dL (ref 0.61–1.24)
GFR calc Af Amer: >60 mL/min (ref >60)
GFR calc non Af Amer: >60 mL/min (ref >60)
Glucose, Bld: 111 mg/dL — ABNORMAL HIGH (ref 70–99)
Potassium: 4.4 mmol/L (ref 3.5–5.1)
Sodium: 142 mmol/L (ref 135–145)

## 2019-06-16 LAB — GLUCOSE, CAPILLARY
Glucose-Capillary: 109 mg/dL — ABNORMAL HIGH (ref 70–99)
Glucose-Capillary: 124 mg/dL — ABNORMAL HIGH (ref 70–99)
Glucose-Capillary: 143 mg/dL — ABNORMAL HIGH (ref 70–99)
Glucose-Capillary: 92 mg/dL (ref 70–99)

## 2019-06-16 LAB — PHOSPHORUS: Phosphorus: 2.8 mg/dL (ref 2.5–4.6)

## 2019-06-16 LAB — CBC WITH DIFFERENTIAL/PLATELET
Abs Immature Granulocytes: 0.04 10*3/uL (ref 0.00–0.07)
Basophils Absolute: 0 10*3/uL (ref 0.0–0.1)
Basophils Relative: 0 %
Eosinophils Absolute: 0 10*3/uL (ref 0.0–0.5)
Eosinophils Relative: 0 %
HCT: 39.9 % (ref 39.0–52.0)
Hemoglobin: 12 g/dL — ABNORMAL LOW (ref 13.0–17.0)
Immature Granulocytes: 0 %
Lymphocytes Relative: 12 %
Lymphs Abs: 1.4 10*3/uL (ref 0.7–4.0)
MCH: 30.8 pg (ref 26.0–34.0)
MCHC: 30.1 g/dL (ref 30.0–36.0)
MCV: 102.6 fL — ABNORMAL HIGH (ref 80.0–100.0)
Monocytes Absolute: 2 10*3/uL — ABNORMAL HIGH (ref 0.1–1.0)
Monocytes Relative: 18 %
Neutro Abs: 7.8 10*3/uL — ABNORMAL HIGH (ref 1.7–7.7)
Neutrophils Relative %: 70 %
Platelets: 103 10*3/uL — ABNORMAL LOW (ref 150–400)
RBC: 3.89 MIL/uL — ABNORMAL LOW (ref 4.22–5.81)
RDW: 13.6 % (ref 11.5–15.5)
WBC: 11.2 10*3/uL — ABNORMAL HIGH (ref 4.0–10.5)
nRBC: 0.2 % (ref 0.0–0.2)

## 2019-06-16 LAB — BLOOD CULTURE ID PANEL (REFLEXED)

## 2019-06-16 LAB — MAGNESIUM: Magnesium: 2.1 mg/dL (ref 1.7–2.4)

## 2019-06-16 LAB — URINE CULTURE: Culture: NO GROWTH

## 2019-06-16 MED ORDER — VITAL HIGH PROTEIN PO LIQD: 1000.0000 mL | ORAL | Status: DC

## 2019-06-16 MED ORDER — PRO-STAT SUGAR FREE PO LIQD
30.0000 mL | Freq: Two times a day (BID) | ORAL | Status: DC
  Administered 2019-06-16 – 2019-06-23 (×13): 30 mL

## 2019-06-16 MED ORDER — ENOXAPARIN SODIUM 30 MG/0.3ML ~~LOC~~ SOLN
30.0000 mg | SUBCUTANEOUS | Status: DC
  Administered 2019-06-16 – 2019-06-18 (×3): 30 mg via SUBCUTANEOUS
  Filled 2019-06-16 (×3): qty 0.3

## 2019-06-16 MED ORDER — VITAL AF 1.2 CAL PO LIQD
1000.0000 mL | ORAL | Status: DC
  Administered 2019-06-16 – 2019-06-17 (×2): 1000 mL

## 2019-06-16 MED ORDER — DEXMEDETOMIDINE HCL IN NACL 400 MCG/100ML IV SOLN
0.4000 ug/kg/h | INTRAVENOUS | Status: DC
  Administered 2019-06-16: 0.4 ug/kg/h via INTRAVENOUS
  Administered 2019-06-16 – 2019-06-18 (×5): 1.2 ug/kg/h via INTRAVENOUS
  Filled 2019-06-16 (×7): qty 100

## 2019-06-16 MED ORDER — ADULT MULTIVITAMIN W/MINERALS CH
1.0000 | ORAL_TABLET | Freq: Every day | ORAL | Status: DC
  Administered 2019-06-16 – 2019-06-21 (×6): 1
  Filled 2019-06-16 (×7): qty 1

## 2019-06-16 NOTE — Progress Notes (Signed)
Initial Nutrition Assessment  DOCUMENTATION CODES:   Severe malnutrition in context of chronic illness  INTERVENTION:  Initiate Vital AF 1.2 Cal at 15 mL/hr and advance by 15 mL/hr every 8 hours to goal rate of 45 mL/hr (1080 mL goal daily volume). Also provide Pro-Stat 30 mL once daily per tube. Goal regimen provides 1396 kcal, 96 grams of protein, 875 mL H2O daily. With current propofol rate provides 1744 kcal daily.  Provide MVI daily per tube.  Monitor magnesium, potassium, and phosphorus daily for at least 3 days, MD to replete as needed, as pt is at risk for refeeding syndrome given severe chronic malnutrition.  NUTRITION DIAGNOSIS:   Severe Malnutrition related to chronic illness(COPD) as evidenced by severe fat depletion, severe muscle depletion.  GOAL:   Provide needs based on ASPEN/SCCM guidelines  MONITOR:   Vent status, Labs, Weight trends, TF tolerance, I & O's  REASON FOR ASSESSMENT:   Ventilator    ASSESSMENT:   49 year old male with PMHx of emphysema, COPD, hx TB admitted with acute COPD exacerbation requiring intubation.   Patient is currently intubated on ventilator support MV: 10.7 L/min Temp (24hrs), Avg:99.4 F (37.4 C), Min:98.1 F (36.7 C), Max:100.6 F (38.1 C)  Propofol: 13.2 ml/hr (348 kcal daily)  Medications reviewed and include: Colace 100 mg BID, Solu-Medrol 40 mg Q12hrs IV, Miralax, famotidine, propofol gtt.  Labs reviewed: CBG 102, Chloride 88, CO2 37, BUN 28.  Enteral Access: 16 Fr. OGT  Discussed with RN and on rounds. If patient is unable to extubate today plan is to start tube feeds this afternoon.  NUTRITION - FOCUSED PHYSICAL EXAM:    Most Recent Value  Orbital Region  Severe depletion  Upper Arm Region  Severe depletion  Thoracic and Lumbar Region  Severe depletion  Buccal Region  Unable to assess  Temple Region  Severe depletion  Clavicle Bone Region  Severe depletion  Clavicle and Acromion Bone Region  Severe  depletion  Scapular Bone Region  Unable to assess  Dorsal Hand  Moderate depletion  Patellar Region  Severe depletion  Anterior Thigh Region  Severe depletion  Posterior Calf Region  Severe depletion  Edema (RD Assessment)  -- [non pitting]  Hair  Reviewed  Eyes  Unable to assess  Mouth  Unable to assess  Skin  Reviewed  Nails  Reviewed     Diet Order:   Diet Order            Diet NPO time specified  Diet effective now             EDUCATION NEEDS:   No education needs have been identified at this time  Skin:  Skin Assessment: Reviewed RN Assessment  Last BM:  Unknown  Height:   Ht Readings from Last 1 Encounters:  06/15/19 5\' 6"  (1.676 m)   Weight:   Wt Readings from Last 1 Encounters:  06/16/19 54.7 kg   Ideal Body Weight:  64.5 kg  BMI:  Body mass index is 19.46 kg/m.  Estimated Nutritional Needs:   Kcal:  1786 (PSU 2003b w/ MSJ 1358, Ve 10.7, Tmax 38.1)  Protein:  95-105 grams  Fluid:  1.6-1.9 L/day  06/18/19, MS, RD, LDN Pager number available on Amion

## 2019-06-16 NOTE — Telephone Encounter (Signed)
Was going to call patient to schedule Palliative Consult and saw that he had been admitted to [facility] on 06/15/19.  Notified Liaison Team.

## 2019-06-16 NOTE — Progress Notes (Signed)
PHARMACY - PHYSICIAN COMMUNICATION CRITICAL VALUE ALERT - BLOOD CULTURE IDENTIFICATION (BCID)  Chris Case is an 49 y.o. male who presented to [facility] on 06/15/2019  Assessment: 1 bottle GPC, staph species, mecA -  Name of physician (or Provider) Contacted: Dr. Belia Heman  Current antibiotics: none  Changes to prescribed antibiotics recommended: none, likely contaminant  Results for orders placed or performed during the hospital encounter of 06/15/19  Blood Culture ID Panel (Reflexed) (Collected: 06/15/2019  5:00 AM)  Result Value Ref Range   Enterococcus species NOT DETECTED NOT DETECTED   Listeria monocytogenes NOT DETECTED NOT DETECTED   Staphylococcus species DETECTED (A) NOT DETECTED   Staphylococcus aureus (BCID) NOT DETECTED NOT DETECTED   Methicillin resistance NOT DETECTED NOT DETECTED   Streptococcus species NOT DETECTED NOT DETECTED   Streptococcus agalactiae NOT DETECTED NOT DETECTED   Streptococcus pneumoniae NOT DETECTED NOT DETECTED   Streptococcus pyogenes NOT DETECTED NOT DETECTED   Acinetobacter baumannii NOT DETECTED NOT DETECTED   Enterobacteriaceae species NOT DETECTED NOT DETECTED   Enterobacter cloacae complex NOT DETECTED NOT DETECTED   Escherichia coli NOT DETECTED NOT DETECTED   Klebsiella oxytoca NOT DETECTED NOT DETECTED   Klebsiella pneumoniae NOT DETECTED NOT DETECTED   Proteus species NOT DETECTED NOT DETECTED   Serratia marcescens NOT DETECTED NOT DETECTED   Haemophilus influenzae NOT DETECTED NOT DETECTED   Neisseria meningitidis NOT DETECTED NOT DETECTED   Pseudomonas aeruginosa NOT DETECTED NOT DETECTED   Candida albicans NOT DETECTED NOT DETECTED   Candida glabrata NOT DETECTED NOT DETECTED   Candida krusei NOT DETECTED NOT DETECTED   Candida parapsilosis NOT DETECTED NOT DETECTED   Candida tropicalis NOT DETECTED NOT DETECTED    Pricilla Riffle, PharmD 06/16/2019  12:25 PM

## 2019-06-16 NOTE — Progress Notes (Signed)
CRITICAL CARE NOTE SYNOPSIS 49 year old former smoker with a history very severe COPD stage IV, who presented via EMS after being found down and unresponsive.  Apparently the patient's mother placed a call to EMS.  The patient has a longstanding history of COPD and noncompliance with his oxygen and CPAP.  History could not be obtained from the patient as there was no family available and the patient had been intubated and was mechanically ventilated in the ED.  Of note he had recently been admitted to [facility] 29 May 2019 through 03 June 2019.  He had been evaluated by palliative care during this admission and actually had a DNR order.   Patient also had a 2D echo  pulmonary hypertension consistent with cor pulmonale.   4/13 admitted for severe resp failure from COPD 4/14 remains critically ill on vent    CC  follow up respiratory failure  SUBJECTIVE Patient remains critically ill Prognosis is guarded Severe COPD   BP 101/82   Pulse 92   Temp 99.3 F (37.4 C)   Resp (!) 25   Ht '5\' 6"'  (1.676 m)   Wt 54.7 kg   SpO2 98%   BMI 19.46 kg/m    I/O last 3 completed shifts: In: 2567.3 [I.V.:125.7; Other:355; IV Piggyback:2086.6] Out: 775 [Urine:775] No intake/output data recorded.  SpO2: 98 % FiO2 (%): 28 %  Estimated body mass index is 19.46 kg/m as calculated from the following:   Height as of this encounter: '5\' 6"'  (1.676 m).   Weight as of this encounter: 54.7 kg.  SIGNIFICANT EVENTS   REVIEW OF SYSTEMS  PATIENT IS UNABLE TO PROVIDE COMPLETE REVIEW OF SYSTEMS DUE TO SEVERE CRITICAL ILLNESS        PHYSICAL EXAMINATION:  GENERAL:critically ill appearing, +resp distress HEAD: Normocephalic, atraumatic.  EYES: Pupils equal, round, reactive to light.  No scleral icterus.  MOUTH: Moist mucosal membrane. NECK: Supple.  PULMONARY: +rhonchi, +wheezing CARDIOVASCULAR: S1 and S2. Regular rate and rhythm. No murmurs, rubs, or gallops.  GASTROINTESTINAL: Soft, nontender,  -distended.  Positive bowel sounds.   MUSCULOSKELETAL: No swelling, clubbing, or edema.  NEUROLOGIC: obtunded, GCS<8 SKIN:intact,warm,dry  MEDICATIONS: I have reviewed all medications and confirmed regimen as documented   CULTURE RESULTS   Recent Results (from the past 240 hour(s))  Culture, blood (routine x 2)     Status: None (Preliminary result)   Collection Time: 06/15/19  5:00 AM   Specimen: BLOOD  Result Value Ref Range Status   Specimen Description BLOOD RIGHT BICEP  Final   Special Requests   Final    BOTTLES DRAWN AEROBIC AND ANAEROBIC Blood Culture adequate volume   Culture  Setup Time   Final    GRAM POSITIVE COCCI AEROBIC BOTTLE ONLY Organism ID to follow CRITICAL RESULT CALLED TO, READ BACK BY AND VERIFIED WITH: CHRISTINE KATSOUDAS AT 0092 ON 06/16/2019 [place]. Performed at [facility], [address] [place], [place] [address]    Culture [facility] POSITIVE COCCI  Final   Report Status PENDING  Incomplete  Respiratory Panel by RT PCR (Flu A&B, Covid) - Nasopharyngeal Swab     Status: None   Collection Time: 06/15/19  5:00 AM   Specimen: Nasopharyngeal Swab  Result Value Ref Range Status   SARS Coronavirus 2 by RT PCR NEGATIVE NEGATIVE Final    Comment: (NOTE) SARS-CoV-2 target nucleic acids are NOT DETECTED. The SARS-CoV-2 RNA is generally detectable in upper respiratoy specimens during the acute phase of infection. The lowest concentration of SARS-CoV-2  viral copies this assay can detect is 131 copies/mL. A negative result does not preclude SARS-Cov-2 infection and should not be used as the sole basis for treatment or other patient management decisions. A negative result may occur with  improper specimen collection/handling, submission of specimen other than nasopharyngeal swab, presence of viral mutation(s) within the areas targeted by this assay, and inadequate number of viral copies (<131 copies/mL). A negative result must be combined with  clinical observations, patient history, and epidemiological information. The expected result is Negative. Fact Sheet for Patients:  PinkCheek.be Fact Sheet for Healthcare Providers:  GravelBags.it This test is not yet ap proved or cleared by the [place] FDA and  has been authorized for detection and/or diagnosis of SARS-CoV-2 by FDA under an Emergency Use Authorization (EUA). This EUA will remain  in effect (meaning this test can be used) for the duration of the COVID-19 declaration under Section 564(b)(1) of the Act, 21 U.S.C. section 360bbb-3(b)(1), unless the authorization is terminated or revoked sooner.    Influenza A by PCR NEGATIVE NEGATIVE Final   Influenza B by PCR NEGATIVE NEGATIVE Final    Comment: (NOTE) The Xpert Xpress SARS-CoV-2/FLU/RSV assay is intended as an aid in  the diagnosis of influenza from Nasopharyngeal swab specimens and  should not be used as a sole basis for treatment. Nasal washings and  aspirates are unacceptable for Xpert Xpress SARS-CoV-2/FLU/RSV  testing. Fact Sheet for Patients: PinkCheek.be Fact Sheet for Healthcare Providers: GravelBags.it This test is not yet approved or cleared by the [place] FDA and  has been authorized for detection and/or diagnosis of SARS-CoV-2 by  FDA under an Emergency Use Authorization (EUA). This EUA will remain  in effect (meaning this test can be used) for the duration of the  Covid-19 declaration under Section 564(b)(1) of the Act, 21  U.S.C. section 360bbb-3(b)(1), unless the authorization is  terminated or revoked. Performed at [facility], [address] [place], [place] [address]   Blood Culture ID Panel (Reflexed)     Status: Abnormal   Collection Time: 06/15/19  5:00 AM  Result Value Ref Range Status   Enterococcus species NOT DETECTED NOT DETECTED Final   Listeria  monocytogenes NOT DETECTED NOT DETECTED Final   Staphylococcus species DETECTED (A) NOT DETECTED Final    Comment: Methicillin (oxacillin) susceptible coagulase negative staphylococcus. Possible blood culture contaminant (unless isolated from more than one blood culture draw or clinical case suggests pathogenicity). No antibiotic treatment is indicated for blood  culture contaminants. CRITICAL RESULT CALLED TO, READ BACK BY AND VERIFIED WITH: CHRISTINE KATSOUDAS AT 5784 ON 06/16/2019 [place].    Staphylococcus aureus (BCID) NOT DETECTED NOT DETECTED Final   Methicillin resistance NOT DETECTED NOT DETECTED Final   Streptococcus species NOT DETECTED NOT DETECTED Final   Streptococcus agalactiae NOT DETECTED NOT DETECTED Final   Streptococcus pneumoniae NOT DETECTED NOT DETECTED Final   Streptococcus pyogenes NOT DETECTED NOT DETECTED Final   Acinetobacter baumannii NOT DETECTED NOT DETECTED Final   Enterobacteriaceae species NOT DETECTED NOT DETECTED Final   Enterobacter cloacae complex NOT DETECTED NOT DETECTED Final   Escherichia coli NOT DETECTED NOT DETECTED Final   Klebsiella oxytoca NOT DETECTED NOT DETECTED Final   Klebsiella pneumoniae NOT DETECTED NOT DETECTED Final   Proteus species NOT DETECTED NOT DETECTED Final   Serratia marcescens NOT DETECTED NOT DETECTED Final   Haemophilus influenzae NOT DETECTED NOT DETECTED Final   Neisseria meningitidis NOT DETECTED NOT DETECTED Final   Pseudomonas aeruginosa NOT  DETECTED NOT DETECTED Final   Candida albicans NOT DETECTED NOT DETECTED Final   Candida glabrata NOT DETECTED NOT DETECTED Final   Candida krusei NOT DETECTED NOT DETECTED Final   Candida parapsilosis NOT DETECTED NOT DETECTED Final   Candida tropicalis NOT DETECTED NOT DETECTED Final    Comment: Performed at [facility], [address] [place], [place] [address]  MRSA PCR Screening     Status: None   Collection Time: 06/15/19 11:36 AM   Specimen: Nasopharyngeal   Result Value Ref Range Status   MRSA by PCR NEGATIVE NEGATIVE Final    Comment:        The GeneXpert MRSA Assay (FDA approved for NASAL specimens only), is one component of a comprehensive MRSA colonization surveillance program. It is not intended to diagnose MRSA infection nor to guide or monitor treatment for MRSA infections. Performed at [facility], [place], [place] [address]         BMP Latest Ref Rng & Units 06/16/2019 06/15/2019 05/31/2019  Glucose 70 - 99 mg/dL 111(H) 140(H) 80  BUN 6 - 20 mg/dL 28(H) 32(H) 16  Creatinine 0.61 - 1.24 mg/dL 0.81 0.69 0.55(L)  Sodium 135 - 145 mmol/L 142 143 142  Potassium 3.5 - 5.1 mmol/L 4.4 5.2(H) 4.3  Chloride 98 - 111 mmol/L 88(L) 86(L) 86(L)  CO2 22 - 32 mmol/L 37(H) 48(H) 47(H)  Calcium 8.9 - 10.3 mg/dL 9.1 9.1 8.3(L)       Indwelling Urinary Catheter continued, requirement due to   Reason to continue Indwelling Urinary Catheter strict Intake/Output monitoring for hemodynamic instability         Ventilator continued, requirement due to severe respiratory failure   Ventilator Sedation RASS 0 to -2      ASSESSMENT AND PLAN SYNOPSIS   Severe ACUTE Hypoxic and Hypercapnic Respiratory Failure -continue Full MV support -continue Bronchodilator Therapy -Wean Fio2 and PEEP as tolerated -will perform SAT/SBT when respiratory parameters are met -VAP/VENT bundle implementation  ACUTE DIASTOLIC CARDIAC FAILURE-  -oxygen as needed -Lasix as tolerated  SEVERE COPD EXACERBATION -continue IV steroids as prescribed -continue NEB THERAPY as prescribed -morphine as needed -wean fio2 as needed and tolerated    NEUROLOGY - intubated and sedated - minimal sedation to achieve a RASS goal: -1 Wake up assessment pending    CARDIAC ICU monitoring  ID -continue IV abx as prescibed -follow up cultures  GI GI PROPHYLAXIS as indicated  NUTRITIONAL STATUS Nutrition Status:          DIET-->TF's as tolerated Constipation protocol as indicated  ENDO - will use ICU hypoglycemic\Hyperglycemia protocol if indicated   ELECTROLYTES -follow labs as needed -replace as needed -pharmacy consultation and following   DVT/GI PRX ordered TRANSFUSIONS AS NEEDED MONITOR FSBS ASSESS the need for LABS as needed   Critical Care Time devoted to patient care services described in this note is 32 minutes.   Overall, patient is critically ill, prognosis is guarded.  Patient with Multiorgan failure and at high risk for cardiac arrest and death.   PALLIATIVE CARE CONSULTATION NEEDED  Corrin Parker, M.D.  [facility]  Medical Director [place] Director [facility] Cardio-Pulmonary Department

## 2019-06-16 NOTE — Progress Notes (Signed)
Civil engineer, contracting [facility]) Hospital Liaison RN note  This is a pending outpatient community based palliative care patient followed by [employer]. Patient was readmitted to the hospital before being seen by [facility].  Will follow for disposition.  Please feel free to call with any questions.  Thank you. Haynes Bast, BSN, RN [facility] Liaison [phone]

## 2019-06-16 NOTE — Progress Notes (Signed)
PHARMACIST - PHYSICIAN COMMUNICATION  CONCERNING:  Enoxaparin (Lovenox) for DVT Prophylaxis    RECOMMENDATION: Patient was prescribed enoxaparin 40 mg q24 hours for VTE prophylaxis.   Filed Weights   06/15/19 0501 06/15/19 1137 06/16/19 0500  Weight: 55.2 kg (121 lb 11.1 oz) 55.2 kg (121 lb 11.1 oz) 54.7 kg (120 lb 9.5 oz)    Body mass index is 19.46 kg/m.  Estimated Creatinine Clearance: 85.4 mL/min (by C-G formula based on SCr of 0.81 mg/dL).   Based on [facility] policy patient is candidate for enoxaparin 30 mg every 24 hours based weight less than 57kg for men   DESCRIPTION: Pharmacy has adjusted enoxaparin dose per [facility] policy.  Patient is now receiving enoxaparin 30 mg every 24 hours.  Pricilla Riffle, PharmD Clinical Pharmacist  06/16/2019 8:18 AM

## 2019-06-16 NOTE — Progress Notes (Signed)
PHARMACY CONSULT NOTE  Pharmacy Consult for Electrolyte Monitoring and Replacement   Recent Labs: Potassium (mmol/L)  Date Value  06/16/2019 4.4  09/24/2013 4.6   Magnesium (mg/dL)  Date Value  14/83/0735 2.1   Calcium (mg/dL)  Date Value  43/03/4838 9.1   Calcium, Total (mg/dL)  Date Value  39/79/5369 8.9   Albumin (g/dL)  Date Value  22/30/0979 4.3   Phosphorus (mg/dL)  Date Value  49/97/1820 2.8   Sodium (mmol/L)  Date Value  06/16/2019 142  09/24/2013 138     Assessment: 49 year old male admitted after being found unresponsive. Patient was intubated in the ED and transferred to the ICU. Patient started on tube feeds and at refeeding risk.  Goal of Therapy:  Electrolytes WNL  Plan:  No electrolyte supplementation at this time. Patient is at refeeding risk. Will check all electrolytes in the morning.  Pricilla Riffle ,PharmD Clinical Pharmacist 06/16/2019 2:39 PM

## 2019-06-17 ENCOUNTER — Inpatient Hospital Stay: Payer: Medicaid Other

## 2019-06-17 DIAGNOSIS — R4189 Other symptoms and signs involving cognitive functions and awareness: Secondary | ICD-10-CM | POA: Diagnosis not present

## 2019-06-17 DIAGNOSIS — J9621 Acute and chronic respiratory failure with hypoxia: Secondary | ICD-10-CM | POA: Diagnosis not present

## 2019-06-17 DIAGNOSIS — J9622 Acute and chronic respiratory failure with hypercapnia: Secondary | ICD-10-CM | POA: Diagnosis not present

## 2019-06-17 DIAGNOSIS — J441 Chronic obstructive pulmonary disease with (acute) exacerbation: Secondary | ICD-10-CM | POA: Diagnosis not present

## 2019-06-17 DIAGNOSIS — Z431 Encounter for attention to gastrostomy: Secondary | ICD-10-CM | POA: Diagnosis not present

## 2019-06-17 LAB — GLUCOSE, CAPILLARY
Glucose-Capillary: 102 mg/dL — ABNORMAL HIGH (ref 70–99)
Glucose-Capillary: 108 mg/dL — ABNORMAL HIGH (ref 70–99)
Glucose-Capillary: 120 mg/dL — ABNORMAL HIGH (ref 70–99)
Glucose-Capillary: 124 mg/dL — ABNORMAL HIGH (ref 70–99)
Glucose-Capillary: 130 mg/dL — ABNORMAL HIGH (ref 70–99)
Glucose-Capillary: 133 mg/dL — ABNORMAL HIGH (ref 70–99)
Glucose-Capillary: 144 mg/dL — ABNORMAL HIGH (ref 70–99)

## 2019-06-17 LAB — CULTURE, BLOOD (ROUTINE X 2): Special Requests: ADEQUATE

## 2019-06-17 LAB — CBC WITH DIFFERENTIAL/PLATELET
Abs Immature Granulocytes: 0.03 10*3/uL (ref 0.00–0.07)
Basophils Absolute: 0 10*3/uL (ref 0.0–0.1)
Basophils Relative: 0 %
Eosinophils Absolute: 0 10*3/uL (ref 0.0–0.5)
Eosinophils Relative: 0 %
HCT: 35.8 % — ABNORMAL LOW (ref 39.0–52.0)
Hemoglobin: 11.3 g/dL — ABNORMAL LOW (ref 13.0–17.0)
Immature Granulocytes: 0 %
Lymphocytes Relative: 12 %
Lymphs Abs: 1.1 10*3/uL (ref 0.7–4.0)
MCH: 30.4 pg (ref 26.0–34.0)
MCHC: 31.6 g/dL (ref 30.0–36.0)
MCV: 96.2 fL (ref 80.0–100.0)
Monocytes Absolute: 0.8 10*3/uL (ref 0.1–1.0)
Monocytes Relative: 9 %
Neutro Abs: 6.7 10*3/uL (ref 1.7–7.7)
Neutrophils Relative %: 79 %
Platelets: 92 10*3/uL — ABNORMAL LOW (ref 150–400)
RBC: 3.72 MIL/uL — ABNORMAL LOW (ref 4.22–5.81)
RDW: 13.6 % (ref 11.5–15.5)
WBC: 8.6 10*3/uL (ref 4.0–10.5)
nRBC: 0 % (ref 0.0–0.2)

## 2019-06-17 LAB — BASIC METABOLIC PANEL
Anion gap: 11 (ref 5–15)
BUN: 24 mg/dL — ABNORMAL HIGH (ref 6–20)
CO2: 34 mmol/L — ABNORMAL HIGH (ref 22–32)
Calcium: 9.1 mg/dL (ref 8.9–10.3)
Chloride: 92 mmol/L — ABNORMAL LOW (ref 98–111)
Creatinine, Ser: 0.52 mg/dL — ABNORMAL LOW (ref 0.61–1.24)
GFR calc Af Amer: >60 mL/min (ref >60)
GFR calc non Af Amer: >60 mL/min (ref >60)
Glucose, Bld: 139 mg/dL — ABNORMAL HIGH (ref 70–99)
Potassium: 3.6 mmol/L (ref 3.5–5.1)
Sodium: 137 mmol/L (ref 135–145)

## 2019-06-17 LAB — PHOSPHORUS: Phosphorus: 3.6 mg/dL (ref 2.5–4.6)

## 2019-06-17 LAB — MAGNESIUM: Magnesium: 1.9 mg/dL (ref 1.7–2.4)

## 2019-06-17 MED ORDER — FENTANYL CITRATE (PF) 100 MCG/2ML IJ SOLN
INTRAMUSCULAR | Status: AC
  Filled 2019-06-17: qty 4

## 2019-06-17 MED ORDER — MIDAZOLAM HCL 2 MG/2ML IJ SOLN
INTRAMUSCULAR | Status: AC
  Filled 2019-06-17: qty 4

## 2019-06-17 MED ORDER — MIDAZOLAM HCL 2 MG/2ML IJ SOLN
2.0000 mg | Freq: Once | INTRAMUSCULAR | Status: AC
  Administered 2019-06-17: 2 mg via INTRAVENOUS

## 2019-06-17 MED ORDER — MORPHINE SULFATE (PF) 2 MG/ML IV SOLN
INTRAVENOUS | Status: AC
  Administered 2019-06-17: 2 mg via INTRAVENOUS
  Filled 2019-06-17: qty 1

## 2019-06-17 MED ORDER — MIDAZOLAM HCL 2 MG/2ML IJ SOLN
1.0000 mg | INTRAMUSCULAR | Status: DC | PRN
  Administered 2019-06-17: 2 mg via INTRAVENOUS
  Administered 2019-06-17: 1 mg via INTRAVENOUS
  Administered 2019-06-17 – 2019-06-23 (×16): 2 mg via INTRAVENOUS
  Filled 2019-06-17 (×18): qty 2

## 2019-06-17 MED ORDER — FENTANYL CITRATE (PF) 100 MCG/2ML IJ SOLN
100.0000 ug | Freq: Once | INTRAMUSCULAR | Status: AC
  Administered 2019-06-17: 100 ug via INTRAVENOUS

## 2019-06-17 MED ORDER — MORPHINE SULFATE (PF) 2 MG/ML IV SOLN
2.0000 mg | INTRAVENOUS | Status: DC | PRN
  Administered 2019-06-22 – 2019-06-24 (×3): 2 mg via INTRAVENOUS
  Administered 2019-06-24: 20:00:00 4 mg via INTRAVENOUS
  Administered 2019-06-25: 08:00:00 2 mg via INTRAVENOUS
  Filled 2019-06-17: qty 2
  Filled 2019-06-17 (×5): qty 1

## 2019-06-17 MED ORDER — VECURONIUM BROMIDE 10 MG IV SOLR: 10.0000 mg | Freq: Once | INTRAVENOUS | Status: AC

## 2019-06-17 MED ORDER — POTASSIUM CHLORIDE 20 MEQ PO PACK
40.0000 meq | PACK | Freq: Once | ORAL | Status: AC
  Administered 2019-06-17: 40 meq
  Filled 2019-06-17: qty 2

## 2019-06-17 MED ORDER — MIDAZOLAM HCL 2 MG/2ML IJ SOLN: 2.0000 mg | Freq: Once | INTRAMUSCULAR | Status: AC

## 2019-06-17 MED ORDER — FENTANYL CITRATE (PF) 100 MCG/2ML IJ SOLN
100.0000 ug | Freq: Once | INTRAMUSCULAR | Status: AC
  Administered 2019-06-24: 100 ug via INTRAVENOUS
  Filled 2019-06-17: qty 2

## 2019-06-17 MED ORDER — VECURONIUM BROMIDE 10 MG IV SOLR
INTRAVENOUS | Status: AC
  Administered 2019-06-17: 13:00:00 10 mg via INTRAVENOUS
  Filled 2019-06-17: qty 10

## 2019-06-17 MED ORDER — METHYLPREDNISOLONE SODIUM SUCC 40 MG IJ SOLR
20.0000 mg | Freq: Two times a day (BID) | INTRAMUSCULAR | Status: DC
  Administered 2019-06-17 – 2019-06-28 (×22): 20 mg via INTRAVENOUS
  Filled 2019-06-17 (×22): qty 1

## 2019-06-17 MED ORDER — POLYETHYLENE GLYCOL 3350 17 G PO PACK: 17.0000 g | PACK | Freq: Two times a day (BID) | ORAL | Status: DC

## 2019-06-17 MED ORDER — MAGNESIUM CITRATE PO SOLN
1.0000 | Freq: Once | ORAL | Status: AC
  Administered 2019-06-17: 1
  Filled 2019-06-17: qty 296

## 2019-06-17 MED ORDER — POLYETHYLENE GLYCOL 3350 17 G PO PACK
17.0000 g | PACK | Freq: Two times a day (BID) | ORAL | Status: DC
  Administered 2019-06-17 – 2019-06-27 (×11): 17 g
  Filled 2019-06-17 (×13): qty 1

## 2019-06-17 NOTE — Progress Notes (Signed)
Cardiopulmonary at bedside placing patient on BiPAP

## 2019-06-17 NOTE — Progress Notes (Signed)
Patient anxious with BiPAP on and stated ""take this off I need fresh air".  Patient on highest dose of  precedex and has been given morphine 2mg  IV.  Patient placed on 2L nasal cannula.

## 2019-06-17 NOTE — Procedures (Signed)
Endotracheal Intubation: Patient required placement of an artificial airway secondary to Respiratory Failure  Consent: Emergent.   Hand washing performed prior to starting the procedure.   Medications administered for sedation prior to procedure:  Midazolam 4 mg IV,  Vecuronium 10 mg IV, Fentanyl 100 mcg IV.    A time out procedure was called and correct patient, name, & ID confirmed. Needed supplies and equipment were assembled and checked to include ETT, 10 ml syringe, Glidescope, Mac and Miller blades, suction, oxygen and bag mask valve, end tidal CO2 monitor.   Patient was positioned to align the mouth and pharynx to facilitate visualization of the glottis.   Heart rate, SpO2 and blood pressure was continuously monitored during the procedure. Pre-oxygenation was conducted prior to intubation and endotracheal tube was placed through the vocal cords into the trachea.     The artificial airway was placed under direct visualization via glidescope route using a 7.5 ETT on the first attempt.  ETT was secured at 23 cm mark.  Placement was confirmed by auscuitation of lungs with good breath sounds bilaterally and no stomach sounds.  Condensation was noted on endotracheal tube.   Pulse ox 98%.  CO2 detector in place with appropriate color change.   Complications: None .   Operator: Tiffiny Worthy.   Chest radiograph ordered and pending.    Corrin Parker, M.D.  [facility]  Medical Director [place] Director [facility] Cardio-Pulmonary Department

## 2019-06-17 NOTE — Plan of Care (Addendum)
PMT note: Per conversation with CCM Dr. Belia Heman, will cancel palliative medicine consult. Goals set.  Please reconsult if needed.

## 2019-06-17 NOTE — Progress Notes (Signed)
CRITICAL CARE NOTE SYNOPSIS 49 year old former smoker with a history very severe COPD stage IV, who presented via EMS after being found down and unresponsive.  Apparently the patient's mother placed a call to EMS.  The patient has a longstanding history of COPD and noncompliance with his oxygen and CPAP.  History could not be obtained from the patient as there was no family available and the patient had been intubated and was mechanically ventilated in the ED.  Of note he had recently been admitted to [facility] 29 May 2019 through 03 June 2019.  He had been evaluated by palliative care during this admission and actually had a DNR order.   Patient also had a 2D echo  pulmonary hypertension consistent with cor pulmonale.   4/13 admitted for severe resp failure from COPD 4/14 remains critically ill on vent 4/15 remains critically ill, severe COPD, failed SAt/SBT   CC  Severe COPD Severe resp failure  HPI Remains critically ill Severe end stage COPD Prognosis is very poor    BP (!) 120/95   Pulse (!) 57   Temp 97.7 F (36.5 C) (Bladder)   Resp 16   Ht '5\' 6"'  (1.676 m)   Wt 54 kg   SpO2 100%   BMI 19.21 kg/m    I/O last 3 completed shifts: In: 1197.7 [I.V.:359.7; Other:355; NG/GT:333; IV Piggyback:150] Out: 1095 [Urine:1095] No intake/output data recorded.  SpO2: 100 % FiO2 (%): 28 %  Estimated body mass index is 19.21 kg/m as calculated from the following:   Height as of this encounter: '5\' 6"'  (1.676 m).   Weight as of this encounter: 54 kg.  REVIEW OF SYSTEMS  PATIENT IS UNABLE TO PROVIDE COMPLETE REVIEW OF SYSTEM S DUE TO SEVERE CRITICAL ILLNESS AND ENCEPHALOPATHY   PHYSICAL EXAMINATION:  GENERAL:critically ill appearing, +resp distress HEAD: Normocephalic, atraumatic.  EYES: Pupils equal, round, reactive to light.  No scleral icterus.  MOUTH: Moist mucosal membrane. NECK: Supple. No thyromegaly. No nodules. No JVD.  PULMONARY: +rhonchi, +wheezing CARDIOVASCULAR:  S1 and S2. Regular rate and rhythm. No murmurs, rubs, or gallops.  GASTROINTESTINAL: Soft, nontender, -distended. Positive bowel sounds.  MUSCULOSKELETAL: No swelling, clubbing, or edema.  NEUROLOGIC: obtunded SKIN:intact,warm,dry    MEDICATIONS: I have reviewed all medications and confirmed regimen as documented   CULTURE RESULTS   Recent Results (from the past 240 hour(s))  Culture, blood (routine x 2)     Status: None (Preliminary result)   Collection Time: 06/15/19  5:00 AM   Specimen: BLOOD  Result Value Ref Range Status   Specimen Description   Final    BLOOD RIGHT BICEP Performed at [facility], [address], [place], [place] [address]    Special Requests   Final    BOTTLES DRAWN AEROBIC AND ANAEROBIC Blood Culture adequate volume Performed at [facility], [address] [place], [place] [address]    Culture  Setup Time   Final    GRAM POSITIVE COCCI AEROBIC BOTTLE ONLY CRITICAL RESULT CALLED TO, READ BACK BY AND VERIFIED WITH: CHRISTINE KATSOUDAS AT 1031 ON 06/16/2019 [place]. Performed at [facility], [place] [address], [place], [place] [address]    Culture GRAM POSITIVE COCCI  Final   Report Status PENDING  Incomplete  Culture, blood (routine x 2)     Status: None (Preliminary result)   Collection Time: 06/15/19  5:00 AM   Specimen: BLOOD  Result Value Ref Range Status   Specimen Description BLOOD LEFT AC  Final  Special Requests   Final    BOTTLES DRAWN AEROBIC AND ANAEROBIC Blood Culture adequate volume   Culture   Final    NO GROWTH 2 DAYS Performed at [facility], [address] [place], [place] [address]    Report Status PENDING  Incomplete  Respiratory Panel by RT PCR (Flu A&B, Covid) - Nasopharyngeal Swab     Status: None   Collection Time: 06/15/19  5:00 AM   Specimen: Nasopharyngeal Swab  Result Value Ref Range Status   SARS Coronavirus 2 by RT PCR NEGATIVE NEGATIVE Final    Comment: (NOTE) SARS-CoV-2 target  nucleic acids are NOT DETECTED. The SARS-CoV-2 RNA is generally detectable in upper respiratoy specimens during the acute phase of infection. The lowest concentration of SARS-CoV-2 viral copies this assay can detect is 131 copies/mL. A negative result does not preclude SARS-Cov-2 infection and should not be used as the sole basis for treatment or other patient management decisions. A negative result may occur with  improper specimen collection/handling, submission of specimen other than nasopharyngeal swab, presence of viral mutation(s) within the areas targeted by this assay, and inadequate number of viral copies (<131 copies/mL). A negative result must be combined with clinical observations, patient history, and epidemiological information. The expected result is Negative. Fact Sheet for Patients:  PinkCheek.be Fact Sheet for Healthcare Providers:  GravelBags.it This test is not yet ap proved or cleared by the [place] FDA and  has been authorized for detection and/or diagnosis of SARS-CoV-2 by FDA under an Emergency Use Authorization (EUA). This EUA will remain  in effect (meaning this test can be used) for the duration of the COVID-19 declaration under Section 564(b)(1) of the Act, 21 U.S.C. section 360bbb-3(b)(1), unless the authorization is terminated or revoked sooner.    Influenza A by PCR NEGATIVE NEGATIVE Final   Influenza B by PCR NEGATIVE NEGATIVE Final    Comment: (NOTE) The Xpert Xpress SARS-CoV-2/FLU/RSV assay is intended as an aid in  the diagnosis of influenza from Nasopharyngeal swab specimens and  should not be used as a sole basis for treatment. Nasal washings and  aspirates are unacceptable for Xpert Xpress SARS-CoV-2/FLU/RSV  testing. Fact Sheet for Patients: PinkCheek.be Fact Sheet for Healthcare Providers: GravelBags.it This test is not  yet approved or cleared by the [place] FDA and  has been authorized for detection and/or diagnosis of SARS-CoV-2 by  FDA under an Emergency Use Authorization (EUA). This EUA will remain  in effect (meaning this test can be used) for the duration of the  Covid-19 declaration under Section 564(b)(1) of the Act, 21  U.S.C. section 360bbb-3(b)(1), unless the authorization is  terminated or revoked. Performed at [facility], [address], [place], [place] [address]   Urine Culture     Status: None   Collection Time: 06/15/19  5:00 AM   Specimen: Urine, Random  Result Value Ref Range Status   Specimen Description   Final    URINE, RANDOM Performed at [facility], [address] [place], [place] [address]    Special Requests   Final    NONE Performed at [facility], [address], [place], [place] [address]    Culture   Final    NO GROWTH Performed at [facility], [place] [address], [place], [place] [address]    Report Status 06/16/2019 FINAL  Final  Blood Culture ID Panel (Reflexed)     Status: Abnormal   Collection Time: 06/15/19  5:00  AM  Result Value Ref Range Status   Enterococcus species NOT DETECTED NOT DETECTED Final   Listeria monocytogenes NOT DETECTED NOT DETECTED Final   Staphylococcus species DETECTED (A) NOT DETECTED Final    Comment: Methicillin (oxacillin) susceptible coagulase negative staphylococcus. Possible blood culture contaminant (unless isolated from more than one blood culture draw or clinical case suggests pathogenicity). No antibiotic treatment is indicated for blood  culture contaminants. CRITICAL RESULT CALLED TO, READ BACK BY AND VERIFIED WITH: CHRISTINE KATSOUDAS AT 9381 ON 06/16/2019 [place].    Staphylococcus aureus (BCID) NOT DETECTED NOT DETECTED Final   Methicillin resistance NOT DETECTED NOT DETECTED Final   Streptococcus species NOT DETECTED NOT DETECTED Final   Streptococcus agalactiae NOT DETECTED NOT  DETECTED Final   Streptococcus pneumoniae NOT DETECTED NOT DETECTED Final   Streptococcus pyogenes NOT DETECTED NOT DETECTED Final   Acinetobacter baumannii NOT DETECTED NOT DETECTED Final   Enterobacteriaceae species NOT DETECTED NOT DETECTED Final   Enterobacter cloacae complex NOT DETECTED NOT DETECTED Final   Escherichia coli NOT DETECTED NOT DETECTED Final   Klebsiella oxytoca NOT DETECTED NOT DETECTED Final   Klebsiella pneumoniae NOT DETECTED NOT DETECTED Final   Proteus species NOT DETECTED NOT DETECTED Final   Serratia marcescens NOT DETECTED NOT DETECTED Final   Haemophilus influenzae NOT DETECTED NOT DETECTED Final   Neisseria meningitidis NOT DETECTED NOT DETECTED Final   Pseudomonas aeruginosa NOT DETECTED NOT DETECTED Final   Candida albicans NOT DETECTED NOT DETECTED Final   Candida glabrata NOT DETECTED NOT DETECTED Final   Candida krusei NOT DETECTED NOT DETECTED Final   Candida parapsilosis NOT DETECTED NOT DETECTED Final   Candida tropicalis NOT DETECTED NOT DETECTED Final    Comment: Performed at [facility], [address] [place], [place] [address]  MRSA PCR Screening     Status: None   Collection Time: 06/15/19 11:36 AM   Specimen: Nasopharyngeal  Result Value Ref Range Status   MRSA by PCR NEGATIVE NEGATIVE Final    Comment:        The GeneXpert MRSA Assay (FDA approved for NASAL specimens only), is one component of a comprehensive MRSA colonization surveillance program. It is not intended to diagnose MRSA infection nor to guide or monitor treatment for MRSA infections. Performed at [facility], [place], [place] [address]         BMP Latest Ref Rng & Units 06/17/2019 06/16/2019 06/15/2019  Glucose 70 - 99 mg/dL 139(H) 111(H) 140(H)  BUN 6 - 20 mg/dL 24(H) 28(H) 32(H)  Creatinine 0.61 - 1.24 mg/dL 0.52(L) 0.81 0.69  Sodium 135 - 145 mmol/L 137 142 143  Potassium 3.5 - 5.1 mmol/L 3.6 4.4 5.2(H)  Chloride 98 - 111  mmol/L 92(L) 88(L) 86(L)  CO2 22 - 32 mmol/L 34(H) 37(H) 48(H)  Calcium 8.9 - 10.3 mg/dL 9.1 9.1 9.1         Indwelling Urinary Catheter continued, requirement due to   Reason to continue Indwelling Urinary Catheter strict Intake/Output monitoring for hemodynamic instability         Ventilator continued, requirement due to severe respiratory failure   Ventilator Sedation RASS 0 to -2      ASSESSMENT AND PLAN SYNOPSIS  Severe ACUTE Hypoxic and Hypercapnic Respiratory Failure -continue Mechanical Ventilator support -continue Bronchodilator Therapy -Wean Fio2 and PEEP as tolerated -VAP/VENT bundle implementation -will perform SAT/SBT when respiratory parameters are met  SEVERE COPD EXACERBATION -continue IV steroids as prescribed -continue NEB THERAPY as prescribed -  morphine as needed -wean fio2 as needed and tolerated    ACUTE DIASTOLIC CARDIAC FAILURE- COR PULMOANLE AND PULM HTN -oxygen as needed -Lasix as tolerated Poor prognosis   NEUROLOGY - intubated and sedated - minimal sedation to achieve a RASS goal: -1   CARDIAC ICU monitoring  INFECTIOUS DISEASE -continue antibiotics as prescribed -follow up cultures -follow up ID consultation    GI GI PROPHYLAXIS as indicated  NUTRITIONAL STATUS DIET-->TF's as tolerated Constipation protocol as indicated  ELECTROLYTES -follow labs as needed -replace as needed -pharmacy consultation and following    DVT/GI PRX ordered TRANSFUSIONS AS NEEDED MONITOR FSBS ASSESS the need for LABS as needed     Critical Care Time devoted to patient care services described in this note is 43 minutes.   Overall, patient is critically ill, prognosis is guarded.  Patient with Multiorgan failure and at high risk for cardiac arrest and death.   PATIENT HAS STATED TO BE DNR/DNI IN THE PAST IT SEEMS THAT HE HAS STATED BEFORE THAT PATIENT WOULD WANT HIS SISTER TO MAKE DECISIONS.  PALLIATIVE CARE  CONSULTED  Corrin Parker, M.D.  [facility]  Medical Director [place] Director [facility] Cardio-Pulmonary Department

## 2019-06-17 NOTE — Progress Notes (Signed)
Patient extubated and placed on 2l nasal cannula.  Patient with weak cough post extubation and trying to talk.  Patient voice is raspy.    PATIENT IS ALERT AND AWAKE, HE DOES NOT WANT INTUBATION AND DOES NOT WANT VENTILATOR  HE WOULD LIKE BIPAP for RESP DISTRESS   PATIENT HAS CONSENTED TO DNR/DNI STATUS

## 2019-06-17 NOTE — Progress Notes (Signed)
Dr. Belia Heman rounded on patient post extubation.  Per Dr. Belia Heman patient is asking for BiPAP.

## 2019-06-17 NOTE — Progress Notes (Signed)
Patient restless and attempting to pull at ETT.  Fentanyl and versed given, see E-MAR.

## 2019-06-17 NOTE — Progress Notes (Signed)
Patient awake pointing to ETT and motioning for tube to come out.  Dr. Belia Heman at bedside and has givne verbal order for extubation. Per Dr. Belia Heman leave precedex infusing at this time.

## 2019-06-17 NOTE — Progress Notes (Signed)
Dr. Belia Heman talking to Lily Peer, sister  Via phone.

## 2019-06-17 NOTE — Progress Notes (Signed)
Orogastric tube advanced from 60 to 65cm as per recommendation from abdominal x-ray.

## 2019-06-17 NOTE — Progress Notes (Signed)
Patient intubated with 7.5 ETT by [facility], cardiopulmonary.  Dr. Belia Heman at bedside.  Equal bilateral breath sounds noted, condensation noted in ETT, positive color change on c02 detector.  ETT secured with commercial tube holder.

## 2019-06-17 NOTE — Progress Notes (Signed)
Patient continuing to decline and Dr. Belia Heman made decision to intubate.

## 2019-06-17 NOTE — Progress Notes (Signed)
PHARMACY CONSULT NOTE  Pharmacy Consult for Electrolyte Monitoring and Replacement   Recent Labs: Potassium (mmol/L)  Date Value  06/17/2019 3.6  09/24/2013 4.6   Magnesium (mg/dL)  Date Value  68/37/2902 1.9   Calcium (mg/dL)  Date Value  01/17/5207 9.1   Calcium, Total (mg/dL)  Date Value  04/26/3610 8.9   Albumin (g/dL)  Date Value  24/49/7530 4.3   Phosphorus (mg/dL)  Date Value  07/12/209 3.6   Sodium (mmol/L)  Date Value  06/17/2019 137  09/24/2013 138     Assessment: 49 year old male admitted after being found unresponsive. Patient was intubated in the ED and transferred to the ICU. Patient started on tube feeds and at refeeding risk.  Patient extubated 4/15 am and placed on BiPAP.  Goal of Therapy:  Electrolytes WNL  Plan:  Potassium 40 mEq given via tube this morning prior to extubation. Patient is at refeeding risk. Will check all electrolytes in the morning.  Pricilla Riffle ,PharmD Clinical Pharmacist 06/17/2019 11:29 AM

## 2019-06-17 NOTE — Progress Notes (Signed)
Morphine 2mg IV given for shortness of breath.  

## 2019-06-17 NOTE — Progress Notes (Signed)
Patient remains on precedex gtt. Around 0300, patient became more agitated, reaching for ETT tube. PRN fentanyl given and PRN ativan ordered for agitation. Vital signs stable throughout shift.

## 2019-06-17 NOTE — Progress Notes (Signed)
Pt was suctioned for a small amount of thick white secretions. Per Dr. Clovis Fredrickson order, he was extubated and placed on a 4L nasal cannula.

## 2019-06-17 NOTE — Progress Notes (Signed)
Patient placed back on BiPAP for increased work of breathing and desating into 79-82%.

## 2019-06-17 NOTE — Consult Note (Signed)
.. Chris, Case [id number] [birth year] Salena Saner, MD  Reason for Consult: tracheostomy   HPI: 49 year old male with history of end-stage COPD and pulmonary HTN currently admitted for respiratory failure.  Failed extubation and need for long term ventilatory support.  Prior to this admission, patient was DNR but patient is responsive and alert to questions and commands today.  Allergies: No Known Allergies  ROS: Review of systems normal other than 12 systems except per HPI.  PMH:  Past Medical History:  Diagnosis Date  . COPD (chronic obstructive pulmonary disease) (HCC)   . Emphysema lung (HCC)   . 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 COPD Mother     SH:  Social History   Socioeconomic History  . Marital status: Married    Spouse name: Not on file  . Number of children: Not on file  . Years of education: Not on file  . Highest education level: Not on file  Occupational History  . Not on file  Tobacco Use  . Smoking status: Former Smoker    Packs/day: 3.00    Years: 20.00    Pack years: 60.00    Types: Cigarettes    Quit date: 04/11/2016    Years since quitting: 3.1  . Smokeless tobacco: Never Used  Substance and Sexual Activity  . Alcohol use: No    Comment: former drinker-1 pint of gin/week-quit 2015  . Drug use: Yes    Types: Marijuana    Comment: one joint /day x 2 y  . Sexual activity: Yes  Other Topics Concern  . Not on file  Social History Narrative  . Not on file   Social Determinants of Health   Financial Resource Strain:   . Difficulty of Paying Living Expenses:   Food Insecurity:   . Worried About Programme researcher, broadcasting/film/video in the Last Year:   . Barista in the Last Year:   Transportation Needs:   . Freight forwarder (Medical):   Marland Kitchen Lack of Transportation (Non-Medical):   Physical Activity:   . Days of Exercise per Week:   . Minutes of Exercise per  Session:   Stress:   . Feeling of Stress :   Social Connections:   . Frequency of Communication with Friends and Family:   . Frequency of Social Gatherings with Friends and Family:   . Attends Religious Services:   . Active Member of Clubs or Organizations:   . Attends Banker Meetings:   Marland Kitchen Marital Status:   Intimate Partner Violence:   . Fear of Current or Ex-Partner:   . Emotionally Abused:   Marland Kitchen Physically Abused:   . Sexually Abused:     PSH:  Past Surgical History:  Procedure Laterality Date  . APPENDECTOMY  late 1980's  . LAPAROTOMY N/A 12/06/2016   Procedure: EXPLORATORY LAPAROTOMY- SBO;  Surgeon: [name], MD;  Location: [facility] ORS;  Service: General;  Laterality: N/A;    Physical  Exam:  GEN-  Sitting upright in bed responsive to questions and commands with hand gestures and nodding of head NEURO-  CN 2-12 grossly intact and symmetric. EARS-  External ears clear NOSE-  Clear anteriorly OC/OP-  ETT in place and secured, no other abnormality noted NECK-  Thin with normal landmarks RESP- ventilatory support CARD-  RRR  A/P: End-stage COPD with need for  long term ventilatory support and tracheostomy tube candidate  Plan:  Although patient is listed as DNR in Epic, he is clearly awake and responsive and gives a thumbs up sign and nods his head yes when asked if he wishes to proceed with tracheostomy.  Discussed in detail the procedure and explained to patient that the tracheostomy tube may be permanent or temporary.  Patient demonstrated understanding and agrees with plan.  Will need to hold anticoagulation at midnight prior to procedure.  Hopefully will be able to be performed early next week.   Jeannie Fend Magaby Rumberger 06/17/2019 8:11 PM

## 2019-06-17 NOTE — Progress Notes (Signed)
Patient extubated by Amy with cardiopulmonary and placed on 2l nasal cannula.  Patient with weak cough post extubation and trying to talk.  Patient voice is raspy.

## 2019-06-17 NOTE — Consult Note (Signed)
Cephas Darby, MD [address]  [place]  [place], [place] [address]  Main: [phone]  Fax: [phone] Pager: [phone]   Consultation  Referring Provider:     No ref. provider found Primary Care Physician:  [facility], [facility] Primary Gastroenterologist: Althia Forts         Reason for Consultation:     Evaluation for PEG  Date of Admission:  06/15/2019 Date of Consultation:  06/17/2019         HPI:   Anshul Meddings is a 49 y.o. male with history of end-stage COPD, pulmonary hypertension was admitted with hypoxic and hypercarbic respiratory failure.  Patient was under mechanical ventilation, extubated today, failed extubation, therefore reintubated.  Patient has been on tube feeds.  He did not have any bowel movements since admission.  His abdomen is distended.  Patient is started on bowel regimen today.  ENT is consulted for tracheostomy placement and plan to do it next week per the ICU attending.  GI is consulted for PEG placement.  Patient is on minimal sedation, awake and alert, trying to communicate, nonverbal due to endotracheal intubation  X-ray KUB today reveals ileus  History of appendectomy and ex lap for small bowel obstruction in 2018  NSAIDs: None  Antiplts/Anticoagulants/Anti thrombotics: None  GI Procedures: Unknown  Past Medical History:  Diagnosis Date  . COPD (chronic obstructive pulmonary disease) ([place])   . Emphysema lung ([place])   . 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 APPENDECTOMY  late 1980's  . LAPAROTOMY N/A 12/06/2016   Procedure: EXPLORATORY LAPAROTOMY- SBO;  Surgeon: Florene Glen, MD;  Location: [facility] ORS;  Service: General;  Laterality: N/A;    Prior to Admission medications   Medication Sig Start Date End Date Taking? Authorizing Provider  albuterol (PROVENTIL) (2.5 MG/3ML) 0.083% nebulizer solution Inhale 3 mLs into the lungs every 4  (four) hours as needed for wheezing or shortness of breath. 09/26/15  Yes Theodoro Grist, MD  albuterol (VENTOLIN HFA) 108 (90 Base) MCG/ACT inhaler Inhale 2 puffs into the lungs every 4 (four) hours as needed for shortness of breath. 01/09/19  Yes [provider]  mometasone-formoterol (DULERA) 200-5 MCG/ACT AERO Inhale 2 puffs into the lungs 2 (two) times daily. 09/26/15  Yes Theodoro Grist, MD  Tiotropium Bromide Monohydrate (SPIRIVA RESPIMAT) 1.25 MCG/ACT AERS Inhale 1 puff into the lungs daily.    Yes [provider]    Current Facility-Administered Medications:  .  acetaminophen (TYLENOL) tablet 650 mg, 650 mg, Per Tube, Q4H PRN, Awilda Bill, NP, 650 mg at 06/16/19 1934 .  chlorhexidine gluconate (MEDLINE KIT) (PERIDEX) 0.12 % solution 15 mL, 15 mL, Mouth Rinse, BID, Tyler Pita, MD, 15 mL at 06/17/19 0742 .  Chlorhexidine Gluconate Cloth 2 % PADS 6 each, 6 each, Topical, Daily, Tyler Pita, MD, 6 each at 06/17/19 0059 .  dexmedetomidine (PRECEDEX) 400 MCG/100ML (4 mcg/mL) infusion, 0.4-1.2 mcg/kg/hr, Intravenous, Titrated, Kasa, Kurian, MD, Last Rate: 16.41 mL/hr at 06/17/19 1800, 1.2 mcg/kg/hr at 06/17/19 1800 .  docusate (COLACE) 50 MG/5ML liquid 100 mg, 100 mg, Oral, BID, Awilda Bill, NP, 100 mg at 06/17/19 1014 .  enoxaparin (LOVENOX) injection 30 mg, 30 mg, Subcutaneous, Q24H, Tyler Pita, MD, 30 mg at 06/17/19 1014 .  famotidine (PEPCID) IVPB 20 mg premix, 20 mg, Intravenous, Q12H, Awilda Bill, NP, Stopped at 06/17/19  1044 .  feeding supplement (PRO-STAT SUGAR FREE 64) liquid 30 mL, 30 mL, Per Tube, BID, Mortimer Fries, Kurian, MD, 30 mL at 06/17/19 1015 .  feeding supplement (VITAL AF 1.2 CAL) liquid 1,000 mL, 1,000 mL, Per Tube, Continuous, Kasa, Kurian, MD, Last Rate: 45 mL/hr at 06/17/19 1635, 1,000 mL at 06/17/19 1635 .  fentaNYL (SUBLIMAZE) injection 100 mcg, 100 mcg, Intravenous, Once, Kasa, Kurian, MD .  fentaNYL (SUBLIMAZE) injection  50-200 mcg, 50-200 mcg, Intravenous, Q30 min PRN, Awilda Bill, NP, 100 mcg at 06/17/19 1744 .  ipratropium-albuterol (DUONEB) 0.5-2.5 (3) MG/3ML nebulizer solution 3 mL, 3 mL, Nebulization, Q4H, Blakeney, Dana G, NP, 3 mL at 06/17/19 1558 .  ipratropium-albuterol (DUONEB) 0.5-2.5 (3) MG/3ML nebulizer solution 3 mL, 3 mL, Nebulization, Q6H PRN, Melene Muller, Dana G, NP .  magnesium citrate solution 1 Bottle, 1 Bottle, Per Tube, Once, Harald Quevedo, Tally Due, MD .  MEDLINE mouth rinse, 15 mL, Mouth Rinse, 10 times per day, Tyler Pita, MD, 15 mL at 06/17/19 1639 .  methylPREDNISolone sodium succinate (SOLU-MEDROL) 40 mg/mL injection 20 mg, 20 mg, Intravenous, Q12H, Kasa, Kurian, MD, 20 mg at 06/17/19 1634 .  midazolam (VERSED) injection 1-2 mg, 1-2 mg, Intravenous, Q1H PRN, Awilda Bill, NP, 2 mg at 06/17/19 1744 .  morphine 2 MG/ML injection 2-4 mg, 2-4 mg, Intravenous, Q1H PRN, Flora Lipps, MD, 2 mg at 06/17/19 1059 .  multivitamin with minerals tablet 1 tablet, 1 tablet, Per Tube, Daily, Flora Lipps, MD, 1 tablet at 06/17/19 1014 .  polyethylene glycol (MIRALAX / GLYCOLAX) packet 17 g, 17 g, Per Tube, BID, Hae Ahlers, Tally Due, MD   Family History  Problem Relation Age of Onset  . COPD Mother      Social History   Tobacco Use  . Smoking status: Former Smoker    Packs/day: 3.00    Years: 20.00    Pack years: 60.00    Types: Cigarettes    Quit date: 04/11/2016    Years since quitting: 3.1  . Smokeless tobacco: Never Used  Substance Use Topics  . Alcohol use: No    Comment: former drinker-1 pint of gin/week-quit 2015  . Drug use: Yes    Types: Marijuana    Comment: one joint /day x 2 y    Allergies as of 06/15/2019  . (No Known Allergies)    Review of Systems:    All systems reviewed and negative except where noted in HPI.   Physical Exam:  Vital signs in last 24 hours: Temp:  [96.8 F (36 C)-99.3 F (37.4 C)] 98.4 F (36.9 C) (04/15 1800) Pulse Rate:  [57-119]  71 (04/15 1800) Resp:  [16-32] 25 (04/15 1800) BP: (81-161)/(67-98) 104/86 (04/15 1800) SpO2:  [74 %-100 %] 100 % (04/15 1800) FiO2 (%):  [28 %-100 %] 28 % (04/15 1800) Weight:  [54 kg] 54 kg (04/15 0353) Last BM Date: (PTA) General:   Intubated, partially sedated, alert and awake, not in distress Head:  Normocephalic and atraumatic. Eyes:   No icterus.   Conjunctiva pink. PERRLA. Ears:  Normal auditory acuity. Neck:  Supple; no masses or thyroidomegaly Lungs: Respirations even and unlabored. Lungs clear to auscultation bilaterally.   No wheezes, crackles, or rhonchi.  Heart:  Regular rate and rhythm;  Without murmur, clicks, rubs or gallops Abdomen:  Soft, diffusely distended, tympanic to percussion, nontender.  Hypoactive bowel sounds. No appreciable masses or hepatomegaly.  No rebound or guarding.  Rectal:  Not performed. Msk:  Symmetrical without gross  deformities. Extremities:  Without edema, cyanosis or clubbing. Neurologic:  Alert and oriented x3;  grossly normal neurologically. Skin:  Intact without significant lesions or rashes.   LAB RESULTS: CBC Latest Ref Rng & Units 06/17/2019 06/16/2019 06/15/2019  WBC 4.0 - 10.5 K/uL 8.6 11.2(H) 10.5  Hemoglobin 13.0 - 17.0 g/dL 11.3(L) 12.0(L) 12.2(L)  Hematocrit 39.0 - 52.0 % 35.8(L) 39.9 41.1  Platelets 150 - 400 K/uL 92(L) 103(L) 120(L)    BMET BMP Latest Ref Rng & Units 06/17/2019 06/16/2019 06/15/2019  Glucose 70 - 99 mg/dL 139(H) 111(H) 140(H)  BUN 6 - 20 mg/dL 24(H) 28(H) 32(H)  Creatinine 0.61 - 1.24 mg/dL 0.52(L) 0.81 0.69  Sodium 135 - 145 mmol/L 137 142 143  Potassium 3.5 - 5.1 mmol/L 3.6 4.4 5.2(H)  Chloride 98 - 111 mmol/L 92(L) 88(L) 86(L)  CO2 22 - 32 mmol/L 34(H) 37(H) 48(H)  Calcium 8.9 - 10.3 mg/dL 9.1 9.1 9.1    LFT Hepatic Function Latest Ref Rng & Units 06/15/2019 05/29/2019 04/17/2019  Total Protein 6.5 - 8.1 g/dL 7.7 7.3 7.3  Albumin 3.5 - 5.0 g/dL 4.3 4.2 3.9  AST 15 - 41 U/L _0 ALT 0 - 44 U/L _1 Alk Phosphatase 38 - 126 U/L 50 48 44  Total Bilirubin 0.3 - 1.2 mg/dL 1.2 0.8 1.1     STUDIES: DG Abd 1 View  Result Date: 06/17/2019 CLINICAL DATA:  Orogastric tube placement EXAM: ABDOMEN - 1 VIEW COMPARISON:  June 15, 2019 FINDINGS: Orogastric tube tip is in the stomach. The side port is immediately inferior to the gastroesophageal junction. There are loops of dilated bowel in the visualized upper abdomen. No free air evident. Lung bases are clear. IMPRESSION: Orogastric tube tip and side port in proximal stomach. The side port is immediately inferior to the gastroesophageal junction; it may be prudent to consider advancing orogastric tube 4-5 cm to insure that tube tip and side port are well within the stomach. Visualized bowel dilatation may indicate a degree of underlying ileus. No free air evident. Electronically Signed   By: Lowella Grip III M.D.   On: 06/17/2019 13:47   DG CHEST PORT 1 VIEW  Result Date: 06/17/2019 CLINICAL DATA:  Encounter for intubation. Encounter for imaging study to confirm orogastric tube placement. EXAM: PORTABLE CHEST 1 VIEW COMPARISON:  Chest radiograph 06/15/2019 FINDINGS: The carina is poorly delineated on the current exam. However, the ET tube likely terminates midway between the clavicular heads and carina. An enteric tube passes below the level left hemidiaphragm with tip terminating in the region of the stomach. Heart size within normal limits. No evidence of airspace consolidation within the lungs. A trace right pleural effusion is again questioned. No evidence of pneumothorax. IMPRESSION: Support apparatus as described. No airspace consolidation within the lungs. A trace right pleural effusion is again questioned. Electronically Signed   By: Kellie Simmering DO   On: 06/17/2019 13:56      Impression / Plan:   Torien Ramroop is a 49 y.o. male with end-stage COPD, pulmonary hypertension admitted with hypoxic and hypercarbic respiratory failure, failed  extubation.  GI is consulted for PEG tube placement.  Patient has ileus and currently on tube feeds, started on 4/14  Generalized ileus with no evidence of obstruction on KUB: Agree with aggressive bowel regimen Hold tube feeds until patient starts moving bowels Monitor electrolytes closely, maintain potassium above 4, magnesium above 2, phosphorus above 4 Defer PEG tube placement until ileus  resolves  Thank you for involving me in the care of this patient.      LOS: 2 days   Sherri Sear, MD  06/17/2019, 7:21 PM   Note: This dictation was prepared with Dragon dictation along with smaller phrase technology. Any transcriptional errors that result from this process are unintentional.

## 2019-06-17 NOTE — Progress Notes (Signed)
CH visited pt. after observing pt. very agitated, flighting to pull out breathing tube.  Dr. Belia Heman confirmed pt. wants extubation; CH attempted to help pt. relax.  RT performed extubation --> pt. immediately attempted to talk to [facility] and RN; pt. agitated, anxious to speak. RN and RT encouraged pt. to refrain from talking, focus on deep breaths.  CH plans to return when pt. more stable.    06/17/19 1030  Clinical Encounter Type  Visited With Patient;Health care provider  Visit Type Follow-up;Psychological support;Social support;Critical Care  Referral From Patient  Stress Factors  Patient Stress Factors Loss of control;Health changes

## 2019-06-17 NOTE — Progress Notes (Signed)
PATIENT FAILED EXTUBATION PATIENT WAS EMERGENTLY ITNUBATED  I EXPLAINED TO PATIENT HE WILL NEED TRACH AND PEG TUBE

## 2019-06-18 DIAGNOSIS — R140 Abdominal distension (gaseous): Secondary | ICD-10-CM | POA: Diagnosis not present

## 2019-06-18 DIAGNOSIS — J9621 Acute and chronic respiratory failure with hypoxia: Secondary | ICD-10-CM | POA: Diagnosis not present

## 2019-06-18 DIAGNOSIS — J9622 Acute and chronic respiratory failure with hypercapnia: Secondary | ICD-10-CM | POA: Diagnosis not present

## 2019-06-18 LAB — CBC WITH DIFFERENTIAL/PLATELET
Abs Immature Granulocytes: 0.07 10*3/uL (ref 0.00–0.07)
Basophils Absolute: 0 10*3/uL (ref 0.0–0.1)
Basophils Relative: 0 %
Eosinophils Absolute: 0 10*3/uL (ref 0.0–0.5)
Eosinophils Relative: 0 %
HCT: 35.8 % — ABNORMAL LOW (ref 39.0–52.0)
Hemoglobin: 11.5 g/dL — ABNORMAL LOW (ref 13.0–17.0)
Immature Granulocytes: 1 %
Lymphocytes Relative: 12 %
Lymphs Abs: 1.4 10*3/uL (ref 0.7–4.0)
MCH: 30.8 pg (ref 26.0–34.0)
MCHC: 32.1 g/dL (ref 30.0–36.0)
MCV: 96 fL (ref 80.0–100.0)
Monocytes Absolute: 0.9 10*3/uL (ref 0.1–1.0)
Monocytes Relative: 8 %
Neutro Abs: 9.3 10*3/uL — ABNORMAL HIGH (ref 1.7–7.7)
Neutrophils Relative %: 79 %
Platelets: 90 10*3/uL — ABNORMAL LOW (ref 150–400)
RBC: 3.73 MIL/uL — ABNORMAL LOW (ref 4.22–5.81)
RDW: 13.7 % (ref 11.5–15.5)
WBC: 11.7 10*3/uL — ABNORMAL HIGH (ref 4.0–10.5)
nRBC: 0 % (ref 0.0–0.2)

## 2019-06-18 LAB — BASIC METABOLIC PANEL
Anion gap: 12 (ref 5–15)
BUN: 27 mg/dL — ABNORMAL HIGH (ref 6–20)
CO2: 33 mmol/L — ABNORMAL HIGH (ref 22–32)
Calcium: 9.2 mg/dL (ref 8.9–10.3)
Chloride: 94 mmol/L — ABNORMAL LOW (ref 98–111)
Creatinine, Ser: 0.57 mg/dL — ABNORMAL LOW (ref 0.61–1.24)
GFR calc Af Amer: >60 mL/min (ref >60)
GFR calc non Af Amer: >60 mL/min (ref >60)
Glucose, Bld: 109 mg/dL — ABNORMAL HIGH (ref 70–99)
Potassium: 4 mmol/L (ref 3.5–5.1)
Sodium: 139 mmol/L (ref 135–145)

## 2019-06-18 LAB — GLUCOSE, CAPILLARY
Glucose-Capillary: 117 mg/dL — ABNORMAL HIGH (ref 70–99)
Glucose-Capillary: 115 mg/dL — ABNORMAL HIGH (ref 70–99)
Glucose-Capillary: 78 mg/dL (ref 70–99)
Glucose-Capillary: 77 mg/dL (ref 70–99)
Glucose-Capillary: 98 mg/dL (ref 70–99)
Glucose-Capillary: 113 mg/dL — ABNORMAL HIGH (ref 70–99)

## 2019-06-18 LAB — TRIGLYCERIDES: Triglycerides: 127 mg/dL (ref <150)

## 2019-06-18 LAB — PHOSPHORUS: Phosphorus: 3.9 mg/dL (ref 2.5–4.6)

## 2019-06-18 LAB — MAGNESIUM: Magnesium: 2.1 mg/dL (ref 1.7–2.4)

## 2019-06-18 MED ORDER — ENOXAPARIN SODIUM 30 MG/0.3ML ~~LOC~~ SOLN
30.0000 mg | SUBCUTANEOUS | Status: AC
  Administered 2019-06-19 – 2019-06-21 (×3): 30 mg via SUBCUTANEOUS
  Filled 2019-06-18 (×3): qty 0.3

## 2019-06-18 MED ORDER — VITAL AF 1.2 CAL PO LIQD
1000.0000 mL | ORAL | Status: AC
  Administered 2019-06-18 – 2019-06-21 (×4): 1000 mL

## 2019-06-18 MED ORDER — FENTANYL 2500MCG IN NS 250ML (10MCG/ML) PREMIX INFUSION
0.0000 ug/h | INTRAVENOUS | Status: DC
  Administered 2019-06-18: 50 ug/h via INTRAVENOUS
  Administered 2019-06-19: 16:00:00 250 ug/h via INTRAVENOUS
  Administered 2019-06-19: 150 ug/h via INTRAVENOUS
  Administered 2019-06-19: 250 ug/h via INTRAVENOUS
  Administered 2019-06-20 – 2019-06-22 (×6): 300 ug/h via INTRAVENOUS
  Administered 2019-06-22: 175 ug/h via INTRAVENOUS
  Administered 2019-06-22: 150 ug/h via INTRAVENOUS
  Administered 2019-06-23: 300 ug/h via INTRAVENOUS
  Filled 2019-06-18 (×12): qty 250

## 2019-06-18 MED ORDER — PROPOFOL 1000 MG/100ML IV EMUL
5.0000 ug/kg/min | INTRAVENOUS | Status: DC
  Administered 2019-06-18: 50 ug/kg/min via INTRAVENOUS
  Administered 2019-06-18: 60 ug/kg/min via INTRAVENOUS
  Administered 2019-06-18: 30 ug/kg/min via INTRAVENOUS
  Administered 2019-06-19: 60 ug/kg/min via INTRAVENOUS
  Administered 2019-06-19: 50 ug/kg/min via INTRAVENOUS
  Administered 2019-06-19: 45 ug/kg/min via INTRAVENOUS
  Administered 2019-06-19 (×2): 40 ug/kg/min via INTRAVENOUS
  Administered 2019-06-20 – 2019-06-22 (×8): 50 ug/kg/min via INTRAVENOUS
  Administered 2019-06-22: 45 ug/kg/min via INTRAVENOUS
  Administered 2019-06-22: 50 ug/kg/min via INTRAVENOUS
  Administered 2019-06-22: 30 ug/kg/min via INTRAVENOUS
  Administered 2019-06-23 (×2): 50 ug/kg/min via INTRAVENOUS
  Filled 2019-06-18 (×22): qty 100

## 2019-06-18 MED ORDER — ENOXAPARIN SODIUM 40 MG/0.4ML ~~LOC~~ SOLN: 40.0000 mg | SUBCUTANEOUS | Status: DC

## 2019-06-18 MED ORDER — FLEET ENEMA 7-19 GM/118ML RE ENEM
1.0000 | ENEMA | Freq: Once | RECTAL | Status: AC
  Administered 2019-06-18: 1 via RECTAL

## 2019-06-18 MED ORDER — FUROSEMIDE 10 MG/ML IJ SOLN
20.0000 mg | Freq: Once | INTRAMUSCULAR | Status: AC
  Administered 2019-06-18: 20 mg via INTRAVENOUS
  Filled 2019-06-18: qty 2

## 2019-06-18 NOTE — Progress Notes (Addendum)
CRITICAL CARE NOTE SYNOPSIS 49 year old former smoker with a history very severe COPD stage IV, who presented via EMS after being found down and unresponsive.  Apparently the patient's mother placed a call to EMS.  The patient has a longstanding history of COPD and noncompliance with his oxygen and CPAP.  History could not be obtained from the patient as there was no family available and the patient had been intubated and was mechanically ventilated in the ED.  Of note he had recently been admitted to [facility] 29 May 2019 through 03 June 2019.  He had been evaluated by palliative care during this admission and actually had a DNR order.   Patient also had a 2D echo  with pulmonary hypertension consistent with cor pulmonale.   4/13 admitted for severe resp failure from COPD 4/14 remains critically ill on vent 4/15 remains critically ill, severe COPD, failed SAT/SBT 4/16 extubated yesterday, had to be reintubated remains on ventilator.   CC  Severe COPD Severe resp failure  SUBJ Extubated yesterday Reintubated due to worsening respiratory distress Apparently agreed to tracheostomy and PEG tube Currently sedated, cannot participate in discussion Will likely remain ventilator dependent for life.    BP 97/69   Pulse 75   Temp 98.4 F (36.9 C)   Resp 18   Ht 5\' 6"  (1.676 m)   Wt 56.7 kg   SpO2 99%   BMI 20.18 kg/m    I/O last 3 completed shifts: In: 2417.2 [I.V.:655.6; NG/GT:1711.7; IV Piggyback:50] Out: 1670 [Urine:1670] No intake/output data recorded.  SpO2: 99 % O2 Flow Rate (L/min): 28 L/min FiO2 (%): 28 %  Estimated body mass index is 20.18 kg/m as calculated from the following:   Height as of this encounter: 5\' 6"  (1.676 m).   Weight as of this encounter: 56.7 kg.  REVIEW OF SYSTEMS  PATIENT IS UNABLE TO PROVIDE COMPLETE REVIEW OF SYSTEMS DUE TO INTUBATED/SEDATED STATUS  PHYSICAL EXAMINATION:  GENERAL: Very thin male, temporal wasting noted, sedated on the  ventilator. HEAD: Normocephalic, atraumatic.  EYES: Pupils midpoint equal, round, reactive to light.  No scleral icterus.  MOUTH: Orotracheally intubated.  OG in place.  Oral mucosa moist. NECK: Supple. No thyromegaly. No nodules.  JVD present.  Trachea midline PULMONARY: Very distant breath sounds with very distant end expiratory faint wheezes. + Rales. CARDIOVASCULAR: S1 and S2. Regular rate and rhythm.  No murmurs heard. GASTROINTESTINAL: Abdomen is soft, nondistended, normoactive bowel sounds. MUSCULOSKELETAL: No joint deformity,no edema, clubbing about the fingers.  NEUROLOGIC: Unresponsive on the ventilator. SKIN: Intact,warm,dry.  No rashes noted. PSYCH: Unable to assess, patient on the ventilator.     MEDICATIONS: I have reviewed all medications and confirmed regimen as documented   CULTURE RESULTS   Recent Results (from the past 240 hour(s))  Culture, blood (routine x 2)     Status: Abnormal   Collection Time: 06/15/19  5:00 AM   Specimen: BLOOD  Result Value Ref Range Status   Specimen Description   Final    BLOOD RIGHT BICEP Performed at [facility], [address] [place], [address] [place]    Special Requests   Final    BOTTLES DRAWN AEROBIC AND ANAEROBIC Blood Culture adequate volume Performed at [facility], [address] [place], [address] [place]    Culture  Setup Time   Final    GRAM POSITIVE COCCI AEROBIC BOTTLE ONLY CRITICAL RESULT CALLED TO, READ BACK BY AND VERIFIED WITH: CHRISTINE KATSOUDAS AT 0753 ON 06/16/2019 [facility].  Culture (A)  Final    STAPHYLOCOCCUS SPECIES (COAGULASE NEGATIVE) THE SIGNIFICANCE OF ISOLATING THIS ORGANISM FROM A SINGLE SET OF BLOOD CULTURES WHEN MULTIPLE SETS ARE DRAWN IS UNCERTAIN. PLEASE NOTIFY THE MICROBIOLOGY DEPARTMENT WITHIN ONE WEEK IF SPECIATION AND SENSITIVITIES ARE REQUIRED. Performed at [facility], [address], [place], [place] [address]    Report Status 06/17/2019 FINAL  Final   Culture, blood (routine x 2)     Status: None (Preliminary result)   Collection Time: 06/15/19  5:00 AM   Specimen: BLOOD  Result Value Ref Range Status   Specimen Description BLOOD LEFT AC  Final   Special Requests   Final    BOTTLES DRAWN AEROBIC AND ANAEROBIC Blood Culture adequate volume   Culture   Final    NO GROWTH 3 DAYS Performed at [facility], [address], [place], [place] [address]    Report Status PENDING  Incomplete  Respiratory Panel by RT PCR (Flu A&B, Covid) - Nasopharyngeal Swab     Status: None   Collection Time: 06/15/19  5:00 AM   Specimen: Nasopharyngeal Swab  Result Value Ref Range Status   SARS Coronavirus 2 by RT PCR NEGATIVE NEGATIVE Final    Comment: (NOTE) SARS-CoV-2 target nucleic acids are NOT DETECTED. The SARS-CoV-2 RNA is generally detectable in upper respiratoy specimens during the acute phase of infection. The lowest concentration of SARS-CoV-2 viral copies this assay can detect is 131 copies/mL. A negative result does not preclude SARS-Cov-2 infection and should not be used as the sole basis for treatment or other patient management decisions. A negative result may occur with  improper specimen collection/handling, submission of specimen other than nasopharyngeal swab, presence of viral mutation(s) within the areas targeted by this assay, and inadequate number of viral copies (<131 copies/mL). A negative result must be combined with clinical observations, patient history, and epidemiological information. The expected result is Negative. Fact Sheet for Patients:  https://www.moore.com/ Fact Sheet for Healthcare Providers:  https://www.young.biz/ This test is not yet ap proved or cleared by the [place] FDA and  has been authorized for detection and/or diagnosis of SARS-CoV-2 by FDA under an Emergency Use Authorization (EUA). This EUA will remain  in effect (meaning this test can be  used) for the duration of the COVID-19 declaration under Section 564(b)(1) of the Act, 21 U.S.C. section 360bbb-3(b)(1), unless the authorization is terminated or revoked sooner.    Influenza A by PCR NEGATIVE NEGATIVE Final   Influenza B by PCR NEGATIVE NEGATIVE Final    Comment: (NOTE) The Xpert Xpress SARS-CoV-2/FLU/RSV assay is intended as an aid in  the diagnosis of influenza from Nasopharyngeal swab specimens and  should not be used as a sole basis for treatment. Nasal washings and  aspirates are unacceptable for Xpert Xpress SARS-CoV-2/FLU/RSV  testing. Fact Sheet for Patients: https://www.moore.com/ Fact Sheet for Healthcare Providers: https://www.young.biz/ This test is not yet approved or cleared by the [place] FDA and  has been authorized for detection and/or diagnosis of SARS-CoV-2 by  FDA under an Emergency Use Authorization (EUA). This EUA will remain  in effect (meaning this test can be used) for the duration of the  Covid-19 declaration under Section 564(b)(1) of the Act, 21  U.S.C. section 360bbb-3(b)(1), unless the authorization is  terminated or revoked. Performed at [facility], [address], [place], [place] [address]   Urine Culture     Status: None   Collection Time: 06/15/19  5:00 AM  Specimen: Urine, Random  Result Value Ref Range Status   Specimen Description   Final    URINE, RANDOM Performed at [facility], [address], [place], [place] [address]    Special Requests   Final    NONE Performed at [facility], [address], [place], [place] [address]    Culture   Final    NO GROWTH Performed at [facility], [address], [place], [place] [address]    Report Status 06/16/2019 FINAL  Final  Blood Culture ID Panel (Reflexed)     Status: Abnormal   Collection Time: 06/15/19  5:00 AM  Result Value Ref Range Status   Enterococcus species NOT DETECTED NOT  DETECTED Final   Listeria monocytogenes NOT DETECTED NOT DETECTED Final   Staphylococcus species DETECTED (A) NOT DETECTED Final    Comment: Methicillin (oxacillin) susceptible coagulase negative staphylococcus. Possible blood culture contaminant (unless isolated from more than one blood culture draw or clinical case suggests pathogenicity). No antibiotic treatment is indicated for blood  culture contaminants. CRITICAL RESULT CALLED TO, READ BACK BY AND VERIFIED WITH: CHRISTINE KATSOUDAS AT 0753 ON 06/16/2019 [facility].    Staphylococcus aureus (BCID) NOT DETECTED NOT DETECTED Final   Methicillin resistance NOT DETECTED NOT DETECTED Final   Streptococcus species NOT DETECTED NOT DETECTED Final   Streptococcus agalactiae NOT DETECTED NOT DETECTED Final   Streptococcus pneumoniae NOT DETECTED NOT DETECTED Final   Streptococcus pyogenes NOT DETECTED NOT DETECTED Final   Acinetobacter baumannii NOT DETECTED NOT DETECTED Final   Enterobacteriaceae species NOT DETECTED NOT DETECTED Final   Enterobacter cloacae complex NOT DETECTED NOT DETECTED Final   Escherichia coli NOT DETECTED NOT DETECTED Final   Klebsiella oxytoca NOT DETECTED NOT DETECTED Final   Klebsiella pneumoniae NOT DETECTED NOT DETECTED Final   Proteus species NOT DETECTED NOT DETECTED Final   Serratia marcescens NOT DETECTED NOT DETECTED Final   Haemophilus influenzae NOT DETECTED NOT DETECTED Final   Neisseria meningitidis NOT DETECTED NOT DETECTED Final   Pseudomonas aeruginosa NOT DETECTED NOT DETECTED Final   Candida albicans NOT DETECTED NOT DETECTED Final   Candida glabrata NOT DETECTED NOT DETECTED Final   Candida krusei NOT DETECTED NOT DETECTED Final   Candida parapsilosis NOT DETECTED NOT DETECTED Final   Candida tropicalis NOT DETECTED NOT DETECTED Final    Comment: Performed at [facility], [address] [place], [place] [address]  MRSA PCR Screening     Status: None   Collection Time: 06/15/19 11:36 AM    Specimen: Nasopharyngeal  Result Value Ref Range Status   MRSA by PCR NEGATIVE NEGATIVE Final    Comment:        The GeneXpert MRSA Assay (FDA approved for NASAL specimens only), is one component of a comprehensive MRSA colonization surveillance program. It is not intended to diagnose MRSA infection nor to guide or monitor treatment for MRSA infections. Performed at [facility], [address] [place], [place] [address]         BMP Latest Ref Rng & Units 06/18/2019 06/17/2019 06/16/2019  Glucose 70 - 99 mg/dL 474(Q) 595(G) 387(F)  BUN 6 - 20 mg/dL 64(P) 32(R) 51(O)  Creatinine 0.61 - 1.24 mg/dL 8.41(Y) 6.06(T) 0.16  Sodium 135 - 145 mmol/L 139 137 142  Potassium 3.5 - 5.1 mmol/L 4.0 3.6 4.4  Chloride 98 - 111 mmol/L 94(L) 92(L) 88(L)  CO2 22 - 32 mmol/L 33(H) 34(H) 37(H)  Calcium 8.9 - 10.3 mg/dL 9.2  9.1 9.1         Indwelling Urinary Catheter continued, requirement due to   Reason to continue Indwelling Urinary Catheter strict Intake/Output monitoring for hemodynamic instability         Ventilator continued, requirement due to severe respiratory failure   Ventilator Sedation RASS 0 to -2      ASSESSMENT AND PLAN SYNOPSIS  Acute on chronic hypercarbic/hypoxic respiratory failure COPD exacerbation No evidence of active infection Continue ventilator support Wake up assessment and SBT in the morning if meets criteria Reassess goals of care his prognosis is very poor Apparently agreed to tracheostomy Will likely be ventilator dependent for life Bronchodilators: DuoNeb every 4 and as needed IV corticosteroids There continues to be no indication for antibiotics at present  Severe COPD stage IV with exacerbation Noted noncompliance with oxygen/BiPAP at home Bronchodilators as above IV corticosteroids Patient is DNR however, agreed to tracheostomy and PEG placement He will likely remain ventilator dependent for life Palliative care consultation,  appreciate input  Acute encephalopathy Unresponsiveness Due to hypercarbia Has resolved Requires sedation now for ventilator comfort CT head negative on admission  Modestly elevated troponin Due to demand ischemia Decompensation of cor pulmonale Trend troponins Supportive care  Cor pulmonale with decompensation Pulmonary hypertension Management of issues as above Poor prognostic sign Evidence of volume overload Lasix x1  Acute kidney injury  Avoid nephrotoxins Improving Trend renal panel  Hyperkalemia Likely triggered by acidosis RESOLVED K4.0 today  ID Procalcitonin normal No infiltrate on chest x-ray No indication for antibiotics at present Continue to trend WBC/fever curve  Prophylaxis: Lovenox Famotidine   Critical Care Time devoted to patient care services described in this note is 40 minutes.   Multidisciplinary rounds were performed with the ICU team    C. Derrill Kay, MD [facility]  *This note was dictated using voice recognition software/Dragon.  Despite best efforts to proofread, errors can occur which can change the meaning.  Any change was purely unintentional.

## 2019-06-18 NOTE — Progress Notes (Signed)
Patient able to follow simple commands throughout the day. Intermittent agitation, fentanyl drip added. Tube feeds changed to 20 ml per hour. Enema given this afternoon. No bowel movement at current time. Lasix given, adequate urine output. Continue to monitor.

## 2019-06-18 NOTE — Progress Notes (Signed)
PHARMACY CONSULT NOTE  Pharmacy Consult for Electrolyte Monitoring and Replacement   Recent Labs: Potassium (mmol/L)  Date Value  06/18/2019 4.0  09/24/2013 4.6   Magnesium (mg/dL)  Date Value  94/58/5929 2.1   Calcium (mg/dL)  Date Value  24/46/2863 9.2   Calcium, Total (mg/dL)  Date Value  81/77/1165 8.9   Albumin (g/dL)  Date Value  79/05/8331 4.3   Phosphorus (mg/dL)  Date Value  83/29/1916 3.9   Sodium (mmol/L)  Date Value  06/18/2019 139  09/24/2013 138     Assessment: 49 year old male admitted after being found unresponsive. Patient was intubated in the ED and transferred to the ICU. Patient started on tube feeds and at refeeding risk.  Patient extubated 4/15 am and placed on BiPAP. Patient required reintubation. Awaiting trach placement.  Goal of Therapy:  Electrolytes WNL  Plan:  Electrolytes stable today. Patient is at refeeding risk. Electrolytes ordered daily for now. Will continue to monitor.  Pricilla Riffle ,PharmD Clinical Pharmacist 06/18/2019 9:38 AM

## 2019-06-18 NOTE — Progress Notes (Signed)
Chris Darby, MD [address]  [place]  [place], [place] [address]  Main: [phone]  Fax: [phone] Pager: [phone]   Subjective: No acute events overnight.  [name] abdomen remained distended.  On tube feeds at 45 mils per hour.  Did not have any bowel movement yet.  Remains intubated  Objective: Vital signs in last 24 hours: Vitals:   06/18/19 0800 06/18/19 0807 06/18/19 0900 06/18/19 1000  BP: (!) 86/71  (!) 87/67 92/69  Pulse: 73  69 74  Resp: (!) 25  (!) 25 (!) 25  Temp: 99.7 F (37.6 C)  99.3 F (37.4 C) 98.8 F (37.1 C)  TempSrc:      SpO2: 100% 99% 100% 100%  Weight:      Height:       Weight change: 2.7 kg  Intake/Output Summary (Last 24 hours) at 06/18/2019 1119 Last data filed at 06/18/2019 0600 Gross per 24 hour  Intake 1628.65 ml  Output 495 ml  Net 1133.65 ml     Exam: Heart:: Regular rate and rhythm, S1S2 present or without murmur or extra heart sounds Lungs: bibasilar rales Abdomen: Soft, distended, hypoactive bowel sounds, tympanic to percussion   Lab Results: CBC Latest Ref Rng & Units 06/18/2019 06/17/2019 06/16/2019  WBC 4.0 - 10.5 K/uL 11.7(H) 8.6 11.2(H)  Hemoglobin 13.0 - 17.0 g/dL 11.5(L) 11.3(L) 12.0(L)  Hematocrit 39.0 - 52.0 % 35.8(L) 35.8(L) 39.9  Platelets 150 - 400 K/uL 90(L) 92(L) 103(L)   CMP Latest Ref Rng & Units 06/18/2019 06/17/2019 06/16/2019  Glucose 70 - 99 mg/dL 109(H) 139(H) 111(H)  BUN 6 - 20 mg/dL 27(H) 24(H) 28(H)  Creatinine 0.61 - 1.24 mg/dL 0.57(L) 0.52(L) 0.81  Sodium 135 - 145 mmol/L 139 137 142  Potassium 3.5 - 5.1 mmol/L 4.0 3.6 4.4  Chloride 98 - 111 mmol/L 94(L) 92(L) 88(L)  CO2 22 - 32 mmol/L 33(H) 34(H) 37(H)  Calcium 8.9 - 10.3 mg/dL 9.2 9.1 9.1  Total Protein 6.5 - 8.1 g/dL - - -  Total Bilirubin 0.3 - 1.2 mg/dL - - -  Alkaline Phos 38 - 126 U/L - - -  AST 15 - 41 U/L - - -  ALT 0 - 44 U/L - - -    Micro Results: Recent Results (from the past 240 hour(s))  Culture, blood  (routine x 2)     Status: Abnormal   Collection Time: 06/15/19  5:00 AM   Specimen: BLOOD  Result Value Ref Range Status   Specimen Description   Final    BLOOD RIGHT BICEP Performed at [facility], [address] [place], [place] [address]    Special Requests   Final    BOTTLES DRAWN AEROBIC AND ANAEROBIC Blood Culture adequate volume Performed at [facility], [address] [place], [place] [address]    Culture  Setup Time   Final    GRAM POSITIVE COCCI AEROBIC BOTTLE ONLY CRITICAL RESULT CALLED TO, READ BACK BY AND VERIFIED WITH: CHRISTINE KATSOUDAS AT 9326 ON 06/16/2019 [place].    Culture (A)  Final    STAPHYLOCOCCUS SPECIES (COAGULASE NEGATIVE) THE SIGNIFICANCE OF ISOLATING THIS ORGANISM FROM A SINGLE SET OF BLOOD CULTURES WHEN MULTIPLE SETS ARE DRAWN IS UNCERTAIN. PLEASE NOTIFY THE MICROBIOLOGY DEPARTMENT WITHIN ONE WEEK IF SPECIATION AND SENSITIVITIES ARE REQUIRED. Performed at [facility], [place] [address] [place], [place] [address]    Report Status 06/17/2019 FINAL  Final  Culture, blood (routine x 2)  Status: None (Preliminary result)   Collection Time: 06/15/19  5:00 AM   Specimen: BLOOD  Result Value Ref Range Status   Specimen Description BLOOD LEFT AC  Final   Special Requests   Final    BOTTLES DRAWN AEROBIC AND ANAEROBIC Blood Culture adequate volume   Culture   Final    NO GROWTH 3 DAYS Performed at [facility], [address] [place], [place] [address]    Report Status PENDING  Incomplete  Respiratory Panel by RT PCR (Flu A&B, Covid) - Nasopharyngeal Swab     Status: None   Collection Time: 06/15/19  5:00 AM   Specimen: Nasopharyngeal Swab  Result Value Ref Range Status   SARS Coronavirus 2 by RT PCR NEGATIVE NEGATIVE Final    Comment: (NOTE) SARS-CoV-2 target nucleic acids are NOT DETECTED. The SARS-CoV-2 RNA is generally detectable in upper respiratoy specimens during the acute phase of infection. The lowest  concentration of SARS-CoV-2 viral copies this assay can detect is 131 copies/mL. A negative result does not preclude SARS-Cov-2 infection and should not be used as the sole basis for treatment or other patient management decisions. A negative result may occur with  improper specimen collection/handling, submission of specimen other than nasopharyngeal swab, presence of viral mutation(s) within the areas targeted by this assay, and inadequate number of viral copies (<131 copies/mL). A negative result must be combined with clinical observations, patient history, and epidemiological information. The expected result is Negative. Fact Sheet for Patients:  PinkCheek.be Fact Sheet for Healthcare Providers:  GravelBags.it This test is not yet ap proved or cleared by the [place] FDA and  has been authorized for detection and/or diagnosis of SARS-CoV-2 by FDA under an Emergency Use Authorization (EUA). This EUA will remain  in effect (meaning this test can be used) for the duration of the COVID-19 declaration under Section 564(b)(1) of the Act, 21 U.S.C. section 360bbb-3(b)(1), unless the authorization is terminated or revoked sooner.    Influenza A by PCR NEGATIVE NEGATIVE Final   Influenza B by PCR NEGATIVE NEGATIVE Final    Comment: (NOTE) The Xpert Xpress SARS-CoV-2/FLU/RSV assay is intended as an aid in  the diagnosis of influenza from Nasopharyngeal swab specimens and  should not be used as a sole basis for treatment. Nasal washings and  aspirates are unacceptable for Xpert Xpress SARS-CoV-2/FLU/RSV  testing. Fact Sheet for Patients: PinkCheek.be Fact Sheet for Healthcare Providers: GravelBags.it This test is not yet approved or cleared by the [place] FDA and  has been authorized for detection and/or diagnosis of SARS-CoV-2 by  FDA under an Emergency Use  Authorization (EUA). This EUA will remain  in effect (meaning this test can be used) for the duration of the  Covid-19 declaration under Section 564(b)(1) of the Act, 21  U.S.C. section 360bbb-3(b)(1), unless the authorization is  terminated or revoked. Performed at [facility], [address] [place], [place] [address]   Urine Culture     Status: None   Collection Time: 06/15/19  5:00 AM   Specimen: Urine, Random  Result Value Ref Range Status   Specimen Description   Final    URINE, RANDOM Performed at [facility], [address], [place], [place] [address]    Special Requests   Final    NONE Performed at [facility], [address], [place], [place] [address]    Culture   Final    NO GROWTH Performed at [facility], [place]  [address], [place], [place] [address]    Report Status 06/16/2019 FINAL  Final  Blood Culture ID Panel (Reflexed)     Status: Abnormal   Collection Time: 06/15/19  5:00 AM  Result Value Ref Range Status   Enterococcus species NOT DETECTED NOT DETECTED Final   Listeria monocytogenes NOT DETECTED NOT DETECTED Final   Staphylococcus species DETECTED (A) NOT DETECTED Final    Comment: Methicillin (oxacillin) susceptible coagulase negative staphylococcus. Possible blood culture contaminant (unless isolated from more than one blood culture draw or clinical case suggests pathogenicity). No antibiotic treatment is indicated for blood  culture contaminants. CRITICAL RESULT CALLED TO, READ BACK BY AND VERIFIED WITH: CHRISTINE KATSOUDAS AT 2010 ON 06/16/2019 [place].    Staphylococcus aureus (BCID) NOT DETECTED NOT DETECTED Final   Methicillin resistance NOT DETECTED NOT DETECTED Final   Streptococcus species NOT DETECTED NOT DETECTED Final   Streptococcus agalactiae NOT DETECTED NOT DETECTED Final   Streptococcus pneumoniae NOT DETECTED NOT DETECTED Final   Streptococcus pyogenes NOT DETECTED NOT DETECTED Final   Acinetobacter  baumannii NOT DETECTED NOT DETECTED Final   Enterobacteriaceae species NOT DETECTED NOT DETECTED Final   Enterobacter cloacae complex NOT DETECTED NOT DETECTED Final   Escherichia coli NOT DETECTED NOT DETECTED Final   Klebsiella oxytoca NOT DETECTED NOT DETECTED Final   Klebsiella pneumoniae NOT DETECTED NOT DETECTED Final   Proteus species NOT DETECTED NOT DETECTED Final   Serratia marcescens NOT DETECTED NOT DETECTED Final   Haemophilus influenzae NOT DETECTED NOT DETECTED Final   Neisseria meningitidis NOT DETECTED NOT DETECTED Final   Pseudomonas aeruginosa NOT DETECTED NOT DETECTED Final   Candida albicans NOT DETECTED NOT DETECTED Final   Candida glabrata NOT DETECTED NOT DETECTED Final   Candida krusei NOT DETECTED NOT DETECTED Final   Candida parapsilosis NOT DETECTED NOT DETECTED Final   Candida tropicalis NOT DETECTED NOT DETECTED Final    Comment: Performed at [facility], [address] [place], [place] [address]  MRSA PCR Screening     Status: None   Collection Time: 06/15/19 11:36 AM   Specimen: Nasopharyngeal  Result Value Ref Range Status   MRSA by PCR NEGATIVE NEGATIVE Final    Comment:        The GeneXpert MRSA Assay (FDA approved for NASAL specimens only), is one component of a comprehensive MRSA colonization surveillance program. It is not intended to diagnose MRSA infection nor to guide or monitor treatment for MRSA infections. Performed at [facility], [address] [place], [place] [address]    Studies/Results: [place] Abd 1 View  Result Date: 06/17/2019 CLINICAL DATA:  Orogastric tube placement EXAM: ABDOMEN - 1 VIEW COMPARISON:  June 15, 2019 FINDINGS: Orogastric tube tip is in the stomach. The side port is immediately inferior to the gastroesophageal junction. There are loops of dilated bowel in the visualized upper abdomen. No free air evident. Lung bases are clear. IMPRESSION: Orogastric tube tip and side port in proximal  stomach. The side port is immediately inferior to the gastroesophageal junction; it may be prudent to consider advancing orogastric tube 4-5 cm to insure that tube tip and side port are well within the stomach. Visualized bowel dilatation may indicate a degree of underlying ileus. No free air evident. Electronically Signed   By: Lowella Grip III M.D.   On: 06/17/2019 13:47   DG CHEST PORT 1 VIEW  Result Date: 06/17/2019 CLINICAL DATA:  Encounter for intubation. Encounter for imaging study to confirm orogastric tube placement.  EXAM: PORTABLE CHEST 1 VIEW COMPARISON:  Chest radiograph 06/15/2019 FINDINGS: The carina is poorly delineated on the current exam. However, the ET tube likely terminates midway between the clavicular heads and carina. An enteric tube passes below the level left hemidiaphragm with tip terminating in the region of the stomach. Heart size within normal limits. No evidence of airspace consolidation within the lungs. A trace right pleural effusion is again questioned. No evidence of pneumothorax. IMPRESSION: Support apparatus as described. No airspace consolidation within the lungs. A trace right pleural effusion is again questioned. Electronically Signed   By: Kellie Simmering DO   On: 06/17/2019 13:56   Medications:  I have reviewed the patient's current medications. Prior to Admission:  Medications Prior to Admission  Medication Sig Dispense Refill Last Dose  . albuterol (PROVENTIL) (2.5 MG/3ML) 0.083% nebulizer solution Inhale 3 mLs into the lungs every 4 (four) hours as needed for wheezing or shortness of breath. 75 mL 12 prn at prn  . albuterol (VENTOLIN HFA) 108 (90 Base) MCG/ACT inhaler Inhale 2 puffs into the lungs every 4 (four) hours as needed for shortness of breath.   prn at prn  . mometasone-formoterol (DULERA) 200-5 MCG/ACT AERO Inhale 2 puffs into the lungs 2 (two) times daily. 1 Inhaler 5 unknown at unknown  . Tiotropium Bromide Monohydrate (SPIRIVA RESPIMAT) 1.25  MCG/ACT AERS Inhale 1 puff into the lungs daily.    unknown at unknown   Scheduled: . chlorhexidine gluconate (MEDLINE KIT)  15 mL Mouth Rinse BID  . Chlorhexidine Gluconate Cloth  6 each Topical Daily  . docusate  100 mg Oral BID  . [START ON 06/19/2019] enoxaparin (LOVENOX) injection  30 mg Subcutaneous Q24H  . feeding supplement (PRO-STAT SUGAR FREE 64)  30 mL Per Tube BID  . fentaNYL (SUBLIMAZE) injection  100 mcg Intravenous Once  . ipratropium-albuterol  3 mL Nebulization Q4H  . mouth rinse  15 mL Mouth Rinse 10 times per day  . methylPREDNISolone (SOLU-MEDROL) injection  20 mg Intravenous Q12H  . multivitamin with minerals  1 tablet Per Tube Daily  . polyethylene glycol  17 g Per Tube BID  . sodium phosphate  1 enema Rectal Once   Continuous: . famotidine (PEPCID) IV 20 mg (06/18/19 0807)  . feeding supplement (VITAL AF 1.2 CAL)    . propofol (DIPRIVAN) infusion 30 mcg/kg/min (06/18/19 0600)   TMH:[id number], fentaNYL (SUBLIMAZE) injection, ipratropium-albuterol, midazolam, morphine injection Anti-infectives (From admission, onward)   None     Scheduled Meds: . chlorhexidine gluconate (MEDLINE KIT)  15 mL Mouth Rinse BID  . Chlorhexidine Gluconate Cloth  6 each Topical Daily  . docusate  100 mg Oral BID  . [START ON 06/19/2019] enoxaparin (LOVENOX) injection  30 mg Subcutaneous Q24H  . feeding supplement (PRO-STAT SUGAR FREE 64)  30 mL Per Tube BID  . fentaNYL (SUBLIMAZE) injection  100 mcg Intravenous Once  . ipratropium-albuterol  3 mL Nebulization Q4H  . mouth rinse  15 mL Mouth Rinse 10 times per day  . methylPREDNISolone (SOLU-MEDROL) injection  20 mg Intravenous Q12H  . multivitamin with minerals  1 tablet Per Tube Daily  . polyethylene glycol  17 g Per Tube BID  . sodium phosphate  1 enema Rectal Once   Continuous Infusions: . famotidine (PEPCID) IV 20 mg (06/18/19 0807)  . feeding supplement (VITAL AF 1.2 CAL)    . propofol (DIPRIVAN) infusion 30 mcg/kg/min  (06/18/19 0600)   PRN Meds:.acetaminophen, fentaNYL (SUBLIMAZE) injection, ipratropium-albuterol, midazolam, morphine injection  Assessment: Active Problems:   Unresponsiveness  Chris Case is a 50 y.o. male with end-stage COPD, pulmonary hypertension admitted with hypoxic and hypercarbic respiratory failure, failed extubation.  GI is consulted for PEG tube placement.  Patient has ileus and currently on tube feeds, started on 4/14  Plan: Generalized ileus with no evidence of obstruction on KUB: Agree with aggressive bowel regimen MiraLAX 17 g twice daily and Fleet enema Hold tube feeds or okay with trickle feeds only until patient starts moving bowels Monitor electrolytes closely, maintain potassium above 4, magnesium above 2, phosphorus above 4 Defer PEG tube placement until ileus resolves Please call GI back for evaluation of PEG once ileus resolves  GI will sign off at this time   LOS: 3 days   Rohini Vanga 06/18/2019, 11:19 AM

## 2019-06-19 ENCOUNTER — Inpatient Hospital Stay: Payer: Self-pay

## 2019-06-19 DIAGNOSIS — R4189 Other symptoms and signs involving cognitive functions and awareness: Secondary | ICD-10-CM | POA: Diagnosis not present

## 2019-06-19 DIAGNOSIS — J9622 Acute and chronic respiratory failure with hypercapnia: Secondary | ICD-10-CM | POA: Diagnosis not present

## 2019-06-19 DIAGNOSIS — I2781 Cor pulmonale (chronic): Secondary | ICD-10-CM | POA: Diagnosis not present

## 2019-06-19 DIAGNOSIS — J9621 Acute and chronic respiratory failure with hypoxia: Secondary | ICD-10-CM | POA: Diagnosis not present

## 2019-06-19 LAB — CBC WITH DIFFERENTIAL/PLATELET
Abs Immature Granulocytes: 0.05 10*3/uL (ref 0.00–0.07)
Basophils Absolute: 0 10*3/uL (ref 0.0–0.1)
Basophils Relative: 0 %
Eosinophils Absolute: 0.1 10*3/uL (ref 0.0–0.5)
Eosinophils Relative: 1 %
HCT: 32.5 % — ABNORMAL LOW (ref 39.0–52.0)
Hemoglobin: 10.3 g/dL — ABNORMAL LOW (ref 13.0–17.0)
Immature Granulocytes: 1 %
Lymphocytes Relative: 12 %
Lymphs Abs: 1.3 10*3/uL (ref 0.7–4.0)
MCH: 30.8 pg (ref 26.0–34.0)
MCHC: 31.7 g/dL (ref 30.0–36.0)
MCV: 97.3 fL (ref 80.0–100.0)
Monocytes Absolute: 0.8 10*3/uL (ref 0.1–1.0)
Monocytes Relative: 8 %
Neutro Abs: 8.1 10*3/uL — ABNORMAL HIGH (ref 1.7–7.7)
Neutrophils Relative %: 78 %
Platelets: 80 10*3/uL — ABNORMAL LOW (ref 150–400)
RBC: 3.34 MIL/uL — ABNORMAL LOW (ref 4.22–5.81)
RDW: 13.9 % (ref 11.5–15.5)
WBC: 10.3 10*3/uL (ref 4.0–10.5)
nRBC: 0 % (ref 0.0–0.2)

## 2019-06-19 LAB — BASIC METABOLIC PANEL
Anion gap: 12 (ref 5–15)
BUN: 28 mg/dL — ABNORMAL HIGH (ref 6–20)
CO2: 36 mmol/L — ABNORMAL HIGH (ref 22–32)
Calcium: 8.9 mg/dL (ref 8.9–10.3)
Chloride: 92 mmol/L — ABNORMAL LOW (ref 98–111)
Creatinine, Ser: 0.53 mg/dL — ABNORMAL LOW (ref 0.61–1.24)
GFR calc Af Amer: >60 mL/min (ref >60)
GFR calc non Af Amer: >60 mL/min (ref >60)
Glucose, Bld: 83 mg/dL (ref 70–99)
Potassium: 3.5 mmol/L (ref 3.5–5.1)
Sodium: 140 mmol/L (ref 135–145)

## 2019-06-19 LAB — GLUCOSE, CAPILLARY
Glucose-Capillary: 56 mg/dL — ABNORMAL LOW (ref 70–99)
Glucose-Capillary: 62 mg/dL — ABNORMAL LOW (ref 70–99)
Glucose-Capillary: 63 mg/dL — ABNORMAL LOW (ref 70–99)
Glucose-Capillary: 81 mg/dL (ref 70–99)
Glucose-Capillary: 82 mg/dL (ref 70–99)
Glucose-Capillary: 84 mg/dL (ref 70–99)
Glucose-Capillary: 85 mg/dL (ref 70–99)
Glucose-Capillary: 86 mg/dL (ref 70–99)
Glucose-Capillary: 91 mg/dL (ref 70–99)

## 2019-06-19 LAB — MAGNESIUM: Magnesium: 2.3 mg/dL (ref 1.7–2.4)

## 2019-06-19 LAB — PHOSPHORUS: Phosphorus: 4.5 mg/dL (ref 2.5–4.6)

## 2019-06-19 MED ORDER — BISACODYL 10 MG RE SUPP
10.0000 mg | Freq: Once | RECTAL | Status: AC
  Administered 2019-06-19: 10 mg via RECTAL
  Filled 2019-06-19: qty 1

## 2019-06-19 MED ORDER — DEXTROSE 10 % IV SOLN
INTRAVENOUS | Status: DC
  Administered 2019-06-25 – 2019-06-26 (×3): 50 mL/h via INTRAVENOUS

## 2019-06-19 MED ORDER — DEXTROSE 50 % IV SOLN: 12.5000 g | INTRAVENOUS | Status: AC

## 2019-06-19 MED ORDER — DEXTROSE 50 % IV SOLN
INTRAVENOUS | Status: AC
  Administered 2019-06-19: 17:00:00 12.5 g via INTRAVENOUS
  Filled 2019-06-19: qty 50

## 2019-06-19 MED ORDER — POTASSIUM CHLORIDE 20 MEQ PO PACK
40.0000 meq | PACK | Freq: Once | ORAL | Status: AC
  Administered 2019-06-19: 40 meq
  Filled 2019-06-19: qty 2

## 2019-06-19 NOTE — Progress Notes (Signed)
PHARMACY CONSULT NOTE  Pharmacy Consult for Electrolyte Monitoring and Replacement   Recent Labs: Potassium (mmol/L)  Date Value  06/19/2019 3.5  09/24/2013 4.6   Magnesium (mg/dL)  Date Value  32/44/0102 2.3   Calcium (mg/dL)  Date Value  72/53/6644 8.9   Calcium, Total (mg/dL)  Date Value  03/47/4259 8.9   Albumin (g/dL)  Date Value  56/38/7564 4.3   Phosphorus (mg/dL)  Date Value  33/29/5188 4.5   Sodium (mmol/L)  Date Value  06/19/2019 140  09/24/2013 138     Assessment: 49 year old male admitted after being found unresponsive. Patient was intubated in the ED and transferred to the ICU. Patient started on tube feeds and at refeeding risk.  Patient extubated 4/15 am and placed on BiPAP. Patient required reintubation. Awaiting trach placement.  Goal of Therapy:  Electrolytes WNL  Plan:  4/17 K 3.5. Will order KCL per tube.    Patient is at refeeding risk. Electrolytes ordered daily for now. Will continue to monitor.  Gardner Candle, PharmD, BCPS Clinical Pharmacist 06/19/2019 7:28 AM

## 2019-06-19 NOTE — Plan of Care (Signed)
  Problem: Education: Goal: Knowledge of General Education information will improve Description Including pain rating scale, medication(s)/side effects and non-pharmacologic comfort measures Outcome: Progressing   Problem: Health Behavior/Discharge Planning: Goal: Ability to manage health-related needs will improve Outcome: Progressing   Problem: Clinical Measurements: Goal: Ability to maintain clinical measurements within normal limits will improve Outcome: Progressing Goal: Will remain free from infection Outcome: Progressing Goal: Diagnostic test results will improve Outcome: Progressing Goal: Respiratory complications will improve Outcome: Progressing Goal: Cardiovascular complication will be avoided Outcome: Progressing   Problem: Nutrition: Goal: Adequate nutrition will be maintained Outcome: Progressing   Problem: Elimination: Goal: Will not experience complications related to bowel motility Outcome: Progressing Goal: Will not experience complications related to urinary retention Outcome: Progressing   Problem: Pain Managment: Goal: General experience of comfort will improve Outcome: Progressing   Problem: Safety: Goal: Ability to remain free from injury will improve Outcome: Progressing   Problem: Skin Integrity: Goal: Risk for impaired skin integrity will decrease Outcome: Progressing   

## 2019-06-19 NOTE — Progress Notes (Signed)
Spoke with dtr. [name], re PICC consent.  All questions answered.  PICC consent obtained.

## 2019-06-19 NOTE — Progress Notes (Addendum)
CRITICAL CARE NOTE SYNOPSIS 49 year old former smoker with a history very severe COPD stage IV, who presented via EMS after being found down and unresponsive.  Apparently the patient's mother placed a call to EMS.  The patient has a longstanding history of COPD and noncompliance with his oxygen and CPAP.  History could not be obtained from the patient as there was no family available and the patient had been intubated and was mechanically ventilated in the ED.  Of note he had recently been admitted to [facility] 29 May 2019 through 03 June 2019.  He had been evaluated by palliative care during this admission and actually had a DNR order.   Patient also had a 2D echo  with pulmonary hypertension consistent with cor pulmonale.  Records review: Patient has been following with Dr. Wallene Huh at [facility].  Last known PFTs 11 September 2017.  FEV1 0.52 L or 20% predicted, FVC 1.49 L or 45% of predicted, FEV1/FVC 35%.  FEF 25-75 6%.  DLCO was 33%.  This is consistent with very severe COPD.  Events: 4/13 admitted for severe resp failure from COPD 4/14 remains critically ill on vent 4/15 remains critically ill, severe COPD, failed SAT/SBT 4/16 extubated yesterday, had to be reintubated remains on ventilator 4/17 sedated on the ventilator   CC  Severe COPD Severe resp failure  SUBJ Extubated yesterday Reintubated due to worsening respiratory distress Apparently agreed to tracheostomy and PEG tube Currently sedated, cannot participate in discussion Will likely remain ventilator dependent for life.    BP 90/66   Pulse 66   Temp 99 F (37.2 C)   Resp 18   Ht 5\' 6"  (1.676 m)   Wt 55.6 kg   SpO2 100%   BMI 19.78 kg/m    I/O last 3 completed shifts: In: 2267.7 [I.V.:1138.5; Other:25; NG/GT:904.2; IV Piggyback:200] Out: 1865 [Urine:1865] No intake/output data recorded.  SpO2: 100 % O2 Flow Rate (L/min): 28 L/min FiO2 (%): 28 %  Estimated body mass index is 19.78 kg/m as calculated  from the following:   Height as of this encounter: 5\' 6"  (1.676 m).   Weight as of this encounter: 55.6 kg.  REVIEW OF SYSTEMS  PATIENT IS UNABLE TO PROVIDE COMPLETE REVIEW OF SYSTEMS DUE TO INTUBATED/SEDATED STATUS  PHYSICAL EXAMINATION:  GENERAL: Very thin male, temporal wasting noted, sedated on the ventilator. HEAD: Normocephalic, atraumatic.  EYES: Pupils midpoint equal, round, reactive to light.  No scleral icterus.  MOUTH: Orotracheally intubated.  OG in place.  Oral mucosa moist. NECK: Supple. No thyromegaly. No nodules.  JVD present.  Trachea midline PULMONARY: Very distant breath sounds with very distant end expiratory faint wheezes.  No rales noted today. CARDIOVASCULAR: S1 and S2. Regular rate and rhythm.  No murmurs heard. GASTROINTESTINAL: Abdomen is soft, nondistended, normoactive bowel sounds. MUSCULOSKELETAL: No joint deformity,no edema, + L clubbing about the fingers.  NEUROLOGIC: Minimally on the ventilator.  When sedation lightened becomes asynchronous. SKIN: Intact,warm,dry.  No rashes noted. PSYCH: Unable to assess, patient on the ventilator.     MEDICATIONS: I have reviewed all medications and confirmed regimen as documented   CULTURE RESULTS   Recent Results (from the past 240 hour(s))  Culture, blood (routine x 2)     Status: Abnormal   Collection Time: 06/15/19  5:00 AM   Specimen: BLOOD  Result Value Ref Range Status   Specimen Description   Final    BLOOD RIGHT BICEP Performed at [facility], [address] [place], [place]  [address]    Special Requests   Final    BOTTLES DRAWN AEROBIC AND ANAEROBIC Blood Culture adequate volume Performed at [facility], [address] [place], [place] [address]    Culture  Setup Time   Final    GRAM POSITIVE COCCI AEROBIC BOTTLE ONLY CRITICAL RESULT CALLED TO, READ BACK BY AND VERIFIED WITH: CHRISTINE KATSOUDAS AT 0753 ON 06/16/2019 [facility].    Culture (A)  Final    STAPHYLOCOCCUS  SPECIES (COAGULASE NEGATIVE) THE SIGNIFICANCE OF ISOLATING THIS ORGANISM FROM A SINGLE SET OF BLOOD CULTURES WHEN MULTIPLE SETS ARE DRAWN IS UNCERTAIN. PLEASE NOTIFY THE MICROBIOLOGY DEPARTMENT WITHIN ONE WEEK IF SPECIATION AND SENSITIVITIES ARE REQUIRED. Performed at [facility], [address], [place], [place] [address]    Report Status 06/17/2019 FINAL  Final  Culture, blood (routine x 2)     Status: None (Preliminary result)   Collection Time: 06/15/19  5:00 AM   Specimen: BLOOD  Result Value Ref Range Status   Specimen Description BLOOD LEFT AC  Final   Special Requests   Final    BOTTLES DRAWN AEROBIC AND ANAEROBIC Blood Culture adequate volume   Culture   Final    NO GROWTH 4 DAYS Performed at [facility], [address] [place], [place] [address]    Report Status PENDING  Incomplete  Respiratory Panel by RT PCR (Flu A&B, Covid) - Nasopharyngeal Swab     Status: None   Collection Time: 06/15/19  5:00 AM   Specimen: Nasopharyngeal Swab  Result Value Ref Range Status   SARS Coronavirus 2 by RT PCR NEGATIVE NEGATIVE Final    Comment: (NOTE) SARS-CoV-2 target nucleic acids are NOT DETECTED. The SARS-CoV-2 RNA is generally detectable in upper respiratoy specimens during the acute phase of infection. The lowest concentration of SARS-CoV-2 viral copies this assay can detect is 131 copies/mL. A negative result does not preclude SARS-Cov-2 infection and should not be used as the sole basis for treatment or other patient management decisions. A negative result may occur with  improper specimen collection/handling, submission of specimen other than nasopharyngeal swab, presence of viral mutation(s) within the areas targeted by this assay, and inadequate number of viral copies (<131 copies/mL). A negative result must be combined with clinical observations, patient history, and epidemiological information. The expected result is Negative. Fact Sheet for Patients:   https://www.moore.com/ Fact Sheet for Healthcare Providers:  https://www.young.biz/ This test is not yet ap proved or cleared by the [place] FDA and  has been authorized for detection and/or diagnosis of SARS-CoV-2 by FDA under an Emergency Use Authorization (EUA). This EUA will remain  in effect (meaning this test can be used) for the duration of the COVID-19 declaration under Section 564(b)(1) of the Act, 21 U.S.C. section 360bbb-3(b)(1), unless the authorization is terminated or revoked sooner.    Influenza A by PCR NEGATIVE NEGATIVE Final   Influenza B by PCR NEGATIVE NEGATIVE Final    Comment: (NOTE) The Xpert Xpress SARS-CoV-2/FLU/RSV assay is intended as an aid in  the diagnosis of influenza from Nasopharyngeal swab specimens and  should not be used as a sole basis for treatment. Nasal washings and  aspirates are unacceptable for Xpert Xpress SARS-CoV-2/FLU/RSV  testing. Fact Sheet for Patients: https://www.moore.com/ Fact Sheet for Healthcare Providers: https://www.young.biz/ This test is not yet approved or cleared by the [place] FDA and  has been authorized for detection and/or diagnosis of SARS-CoV-2 by  FDA under an Emergency Use Authorization (EUA).  This EUA will remain  in effect (meaning this test can be used) for the duration of the  Covid-19 declaration under Section 564(b)(1) of the Act, 21  U.S.C. section 360bbb-3(b)(1), unless the authorization is  terminated or revoked. Performed at [facility], [address] [place], [place] [address]   Urine Culture     Status: None   Collection Time: 06/15/19  5:00 AM   Specimen: Urine, Random  Result Value Ref Range Status   Specimen Description   Final    URINE, RANDOM Performed at [facility], [address] [place], [place] [address]    Special Requests   Final    NONE Performed at [facility], [address], [place], [place] [address]    Culture   Final    NO GROWTH Performed at [facility], [address], [place], [place] [address]    Report Status 06/16/2019 FINAL  Final  Blood Culture ID Panel (Reflexed)     Status: Abnormal   Collection Time: 06/15/19  5:00 AM  Result Value Ref Range Status   Enterococcus species NOT DETECTED NOT DETECTED Final   Listeria monocytogenes NOT DETECTED NOT DETECTED Final   Staphylococcus species DETECTED (A) NOT DETECTED Final    Comment: Methicillin (oxacillin) susceptible coagulase negative staphylococcus. Possible blood culture contaminant (unless isolated from more than one blood culture draw or clinical case suggests pathogenicity). No antibiotic treatment is indicated for blood  culture contaminants. CRITICAL RESULT CALLED TO, READ BACK BY AND VERIFIED WITH: CHRISTINE KATSOUDAS AT 0753 ON 06/16/2019 [facility].    Staphylococcus aureus (BCID) NOT DETECTED NOT DETECTED Final   Methicillin resistance NOT DETECTED NOT DETECTED Final   Streptococcus species NOT DETECTED NOT DETECTED Final   Streptococcus agalactiae NOT DETECTED NOT DETECTED Final   Streptococcus pneumoniae NOT DETECTED NOT DETECTED Final   Streptococcus pyogenes NOT DETECTED NOT DETECTED Final   Acinetobacter baumannii NOT DETECTED NOT DETECTED Final   Enterobacteriaceae species NOT DETECTED NOT DETECTED Final   Enterobacter cloacae complex NOT DETECTED NOT DETECTED Final   Escherichia coli NOT DETECTED NOT DETECTED Final   Klebsiella oxytoca NOT DETECTED NOT DETECTED Final   Klebsiella pneumoniae NOT DETECTED NOT DETECTED Final   Proteus species NOT DETECTED NOT DETECTED Final   Serratia marcescens NOT DETECTED NOT DETECTED Final   Haemophilus influenzae NOT DETECTED NOT DETECTED Final   Neisseria meningitidis NOT DETECTED NOT DETECTED Final   Pseudomonas aeruginosa NOT DETECTED NOT DETECTED Final   Candida albicans NOT DETECTED NOT DETECTED Final   Candida  glabrata NOT DETECTED NOT DETECTED Final   Candida krusei NOT DETECTED NOT DETECTED Final   Candida parapsilosis NOT DETECTED NOT DETECTED Final   Candida tropicalis NOT DETECTED NOT DETECTED Final    Comment: Performed at [facility], [address] [place], [place] [address]  MRSA PCR Screening     Status: None   Collection Time: 06/15/19 11:36 AM   Specimen: Nasopharyngeal  Result Value Ref Range Status   MRSA by PCR NEGATIVE NEGATIVE Final    Comment:        The GeneXpert MRSA Assay (FDA approved for NASAL specimens only), is one component of a comprehensive MRSA colonization surveillance program. It is not intended to diagnose MRSA infection nor to guide or monitor treatment for MRSA infections. Performed at [facility], [address] [place], [place] [address]         BMP Latest Ref Rng & Units 06/19/2019  06/18/2019 06/17/2019  Glucose 70 - 99 mg/dL 83 379(K) 240(X)  BUN 6 - 20 mg/dL 73(Z) 32(D) 92(E)  Creatinine 0.61 - 1.24 mg/dL 2.68(T) 4.19(Q) 2.22(L)  Sodium 135 - 145 mmol/L 140 139 137  Potassium 3.5 - 5.1 mmol/L 3.5 4.0 3.6  Chloride 98 - 111 mmol/L 92(L) 94(L) 92(L)  CO2 22 - 32 mmol/L 36(H) 33(H) 34(H)  Calcium 8.9 - 10.3 mg/dL 8.9 9.2 9.1         Indwelling Urinary Catheter continued, requirement due to   Reason to continue Indwelling Urinary Catheter strict Intake/Output monitoring for hemodynamic instability         Ventilator continued, requirement due to severe respiratory failure   Ventilator Sedation RASS 0 to -2      ASSESSMENT AND PLAN SYNOPSIS  Acute on chronic hypercarbic/hypoxic respiratory failure COPD exacerbation No evidence of active infection Continue ventilator support Wake up assessment and SBT in the morning if meets criteria Reassess goals of care his prognosis is very poor Apparently agreed to tracheostomy however, was in distress at the time Will likely be ventilator dependent for  life Bronchodilators: DuoNeb every 4 and as needed IV corticosteroids There continues to be no indication for antibiotics at present Meeting with family tomorrow to establish goals of care  Severe COPD stage IV with exacerbation Noted noncompliance with oxygen/BiPAP at home Bronchodilators as above IV corticosteroids Patient is DNR however, agreed to tracheostomy and PEG placement He will likely remain ventilator dependent for life Establish goals of care with family (sister, daughter) and significant other in the morning  Acute encephalopathy Unresponsiveness Due to hypercarbia Has resolved Requires sedation now for ventilator comfort, asynchrony CT head negative on admission  Modestly elevated troponin Due to demand ischemia Decompensation of cor pulmonale Trend troponins, no further elevation Supportive care  Cor pulmonale with decompensation Pulmonary hypertension Management of issues as above Poor prognostic sign  Hypoglycemia Due to poor nutritional status Continue tube feeds Add D10 ICU hypoglycemia protocol Poor prognostic sign  Acute kidney injury  Avoid nephrotoxins Stable with no worsening Trend renal panel  Hyperkalemia Likely triggered by acidosis RESOLVED K 3.5 today  ID Procalcitonin normal No infiltrate on chest x-ray No indication for antibiotics at present Continue to trend WBC/fever curve  Prophylaxis: Lovenox Famotidine   Critical Care Time devoted to patient care services described in this note is 35 minutes.   Coordination was done with the bedside nurse.  Plan for goals of care discussion with patient's sister, daughter and significant other in the a.m.    Gailen Shelter, MD [facility]  *This note was dictated using voice recognition software/Dragon.  Despite best efforts to proofread, errors can occur which can change the meaning.  Any change was purely unintentional.

## 2019-06-19 NOTE — Progress Notes (Signed)
Hypoglycemic Event   CBG: 56  Treatment: 1/2 amp D50  Symptoms: NONE   Follow-up CBG: Time: 1710 hrs CBG Result:81   Possible Reasons for Event: Unknown  Comments/MD notified: Dr. Crissie Reese

## 2019-06-19 NOTE — Progress Notes (Signed)
Spoke with Dois Davenport RN re PICC placement.  States PIV working.  Notified that PICC probably to be placed 06/20/19.

## 2019-06-19 NOTE — Progress Notes (Signed)
Shift summary:  - Assumed patient care at 1700 hrs from Cleda Clarks, RN.  - Patient is in NAD; remains intubated and sedated.

## 2019-06-20 ENCOUNTER — Other Ambulatory Visit: Payer: Self-pay

## 2019-06-20 ENCOUNTER — Inpatient Hospital Stay: Payer: Medicaid Other

## 2019-06-20 DIAGNOSIS — Z9911 Dependence on respirator [ventilator] status: Secondary | ICD-10-CM

## 2019-06-20 DIAGNOSIS — J9622 Acute and chronic respiratory failure with hypercapnia: Secondary | ICD-10-CM | POA: Diagnosis not present

## 2019-06-20 DIAGNOSIS — J9621 Acute and chronic respiratory failure with hypoxia: Secondary | ICD-10-CM | POA: Diagnosis not present

## 2019-06-20 LAB — COMPREHENSIVE METABOLIC PANEL
ALT: 13 U/L (ref 0–44)
AST: 23 U/L (ref 15–41)
Albumin: 3.5 g/dL (ref 3.5–5.0)
Alkaline Phosphatase: 42 U/L (ref 38–126)
Anion gap: 10 (ref 5–15)
BUN: 22 mg/dL — ABNORMAL HIGH (ref 6–20)
CO2: 35 mmol/L — ABNORMAL HIGH (ref 22–32)
Calcium: 8.8 mg/dL — ABNORMAL LOW (ref 8.9–10.3)
Chloride: 91 mmol/L — ABNORMAL LOW (ref 98–111)
Creatinine, Ser: 0.48 mg/dL — ABNORMAL LOW (ref 0.61–1.24)
GFR calc Af Amer: >60 mL/min (ref >60)
GFR calc non Af Amer: >60 mL/min (ref >60)
Glucose, Bld: 101 mg/dL — ABNORMAL HIGH (ref 70–99)
Potassium: 3.4 mmol/L — ABNORMAL LOW (ref 3.5–5.1)
Sodium: 136 mmol/L (ref 135–145)
Total Bilirubin: 0.6 mg/dL (ref 0.3–1.2)
Total Protein: 6.5 g/dL (ref 6.5–8.1)

## 2019-06-20 LAB — GLUCOSE, CAPILLARY
Glucose-Capillary: 100 mg/dL — ABNORMAL HIGH (ref 70–99)
Glucose-Capillary: 103 mg/dL — ABNORMAL HIGH (ref 70–99)
Glucose-Capillary: 104 mg/dL — ABNORMAL HIGH (ref 70–99)
Glucose-Capillary: 120 mg/dL — ABNORMAL HIGH (ref 70–99)
Glucose-Capillary: 62 mg/dL — ABNORMAL LOW (ref 70–99)
Glucose-Capillary: 71 mg/dL (ref 70–99)
Glucose-Capillary: 95 mg/dL (ref 70–99)

## 2019-06-20 LAB — CULTURE, BLOOD (ROUTINE X 2)
Culture: NO GROWTH
Special Requests: ADEQUATE

## 2019-06-20 LAB — CBC WITH DIFFERENTIAL/PLATELET
Abs Immature Granulocytes: 0.04 10*3/uL (ref 0.00–0.07)
Basophils Absolute: 0 10*3/uL (ref 0.0–0.1)
Basophils Relative: 0 %
Eosinophils Absolute: 0.2 10*3/uL (ref 0.0–0.5)
Eosinophils Relative: 2 %
HCT: 31.8 % — ABNORMAL LOW (ref 39.0–52.0)
Hemoglobin: 10.2 g/dL — ABNORMAL LOW (ref 13.0–17.0)
Immature Granulocytes: 0 %
Lymphocytes Relative: 16 %
Lymphs Abs: 1.6 10*3/uL (ref 0.7–4.0)
MCH: 30.7 pg (ref 26.0–34.0)
MCHC: 32.1 g/dL (ref 30.0–36.0)
MCV: 95.8 fL (ref 80.0–100.0)
Monocytes Absolute: 0.9 10*3/uL (ref 0.1–1.0)
Monocytes Relative: 9 %
Neutro Abs: 7.4 10*3/uL (ref 1.7–7.7)
Neutrophils Relative %: 73 %
Platelets: 81 10*3/uL — ABNORMAL LOW (ref 150–400)
RBC: 3.32 MIL/uL — ABNORMAL LOW (ref 4.22–5.81)
RDW: 13.7 % (ref 11.5–15.5)
WBC: 10.1 10*3/uL (ref 4.0–10.5)
nRBC: 0 % (ref 0.0–0.2)

## 2019-06-20 LAB — PHOSPHORUS: Phosphorus: 5.3 mg/dL — ABNORMAL HIGH (ref 2.5–4.6)

## 2019-06-20 LAB — MAGNESIUM: Magnesium: 2.1 mg/dL (ref 1.7–2.4)

## 2019-06-20 MED ORDER — SODIUM CHLORIDE 0.9% FLUSH
10.0000 mL | Freq: Two times a day (BID) | INTRAVENOUS | Status: DC
  Administered 2019-06-20: 30 mL
  Administered 2019-06-20 – 2019-06-22 (×5): 10 mL
  Administered 2019-06-25: 30 mL
  Administered 2019-06-25: 10 mL
  Administered 2019-06-26: 20 mL
  Administered 2019-06-26 – 2019-07-09 (×23): 10 mL
  Administered 2019-07-10: 30 mL
  Administered 2019-07-10 – 2019-07-12 (×5): 10 mL
  Administered 2019-07-13: 20 mL
  Administered 2019-07-13 – 2019-07-17 (×8): 10 mL
  Administered 2019-07-18: 30 mL
  Administered 2019-07-19 – 2019-07-25 (×11): 10 mL
  Administered 2019-07-25: 20 mL
  Administered 2019-07-26 – 2019-07-27 (×3): 10 mL

## 2019-06-20 MED ORDER — POTASSIUM CHLORIDE 20 MEQ PO PACK
40.0000 meq | PACK | Freq: Once | ORAL | Status: AC
  Administered 2019-06-20: 40 meq
  Filled 2019-06-20: qty 2

## 2019-06-20 MED ORDER — POTASSIUM CHLORIDE 20 MEQ PO PACK
20.0000 meq | PACK | Freq: Once | ORAL | Status: AC
  Administered 2019-06-20: 11:00:00 20 meq
  Filled 2019-06-20: qty 1

## 2019-06-20 MED ORDER — SODIUM CHLORIDE 0.9% FLUSH: 10.0000 mL | INTRAVENOUS | Status: DC | PRN

## 2019-06-20 NOTE — Progress Notes (Signed)
Patient remains on vent. Breathing over rate. Airway pressures high. Patient biting on tube. Able to suction tube however for moderate amount of thick secretions without difficulty. svn given. Will continue to monitor

## 2019-06-20 NOTE — Progress Notes (Signed)
PHARMACY CONSULT NOTE  Pharmacy Consult for Electrolyte Monitoring and Replacement   Recent Labs: Potassium (mmol/L)  Date Value  06/20/2019 3.4 (L)  09/24/2013 4.6   Magnesium (mg/dL)  Date Value  76/72/0947 2.1   Calcium (mg/dL)  Date Value  09/62/8366 8.8 (L)   Calcium, Total (mg/dL)  Date Value  29/47/6546 8.9   Albumin (g/dL)  Date Value  50/35/4656 3.5   Phosphorus (mg/dL)  Date Value  81/27/5170 5.3 (H)   Sodium (mmol/L)  Date Value  06/20/2019 136  09/24/2013 138     Assessment: 49 year old male admitted after being found unresponsive. Patient was intubated in the ED and transferred to the ICU. Patient started on tube feeds and at refeeding risk.  Patient extubated 4/15 am and placed on BiPAP. Patient required reintubation. Awaiting trach placement.  Goal of Therapy:  Electrolytes WNL  Plan:  4/17 K 3.4. Will order KCL per tube, following by KCL per tube x 1.   Patient is at refeeding risk. Electrolytes ordered daily for now. Will continue to monitor.  Gardner Candle, PharmD, BCPS Clinical Pharmacist 06/20/2019 7:16 AM

## 2019-06-20 NOTE — Progress Notes (Signed)
PICC placed.  Cheryl RN notified of PICC placement, also aware to remove all PIV's to prevent CLABSI.

## 2019-06-20 NOTE — Progress Notes (Signed)
Peripherally Inserted Central Catheter Placement  The IV Nurse has discussed with the patient and/or persons authorized to consent for the patient, the purpose of this procedure and the potential benefits and risks involved with this procedure.  The benefits include less needle sticks, lab draws from the catheter, and the patient may be discharged home with the catheter. Risks include, but not limited to, infection, bleeding, blood clot (thrombus formation), and puncture of an artery; nerve damage and irregular heartbeat and possibility to perform a PICC exchange if needed/ordered by physician.  Alternatives to this procedure were also discussed.  Bard Power PICC patient education guide, fact sheet on infection prevention and patient information card has been provided to patient /or left at bedside.  Telephone consent obtained from dtr 06/19/19.  PICC Placement Documentation  PICC Double Lumen 06/20/19 PICC Right Brachial 37 cm 0 cm (Active)  Indication for Insertion or Continuance of Line Vasoactive infusions 06/20/19 0941  Exposed Catheter (cm) 0 cm 06/20/19 0941  Site Assessment Clean;Dry;Intact 06/20/19 0941  Lumen #1 Status Flushed;Saline locked;Blood return noted 06/20/19 0941  Lumen #2 Status Flushed;Saline locked;Blood return noted 06/20/19 0941  Dressing Type Transparent 06/20/19 0941  Dressing Status Clean;Dry;Intact;Antimicrobial disc in place 06/20/19 0941  Line Care Connections checked and tightened 06/20/19 0941  Line Adjustment (NICU/IV Team Only) No 06/20/19 0941  Dressing Intervention New dressing 06/20/19 0941  Dressing Change Due 06/27/19 06/20/19 0941       Chris Case 06/20/2019, 9:41 AM

## 2019-06-20 NOTE — Progress Notes (Signed)
CRITICAL CARE NOTE SYNOPSIS 49 year old former smoker with a history very severe COPD stage IV, who presented via EMS after being found down and unresponsive.  Apparently the patient's mother placed a call to EMS.  The patient has a longstanding history of COPD and noncompliance with his oxygen and CPAP.  History could not be obtained from the patient as there was no family available and the patient had been intubated and was mechanically ventilated in the ED.  Of note he had recently been admitted to [facility] 29 May 2019 through 03 June 2019.  He had been evaluated by palliative care during this admission and actually had a DNR order.   Patient also had a 2D echo  with pulmonary hypertension consistent with cor pulmonale.  Records review: Patient has been following with Dr. Wallene Huh at [facility].  Last known PFTs 11 September 2017.  FEV1 0.52 L or 20% predicted, FVC 1.49 L or 45% of predicted, FEV1/FVC 35%.  FEF 25-75 6%.  DLCO was 33%.  This is consistent with very severe COPD.  Events: 4/13 admitted for severe resp failure from COPD 4/14 remains critically ill on vent 4/15 remains critically ill, severe COPD, failed SAT/SBT 4/16 extubated yesterday, had to be reintubated remains on ventilator 4/17 sedated on the ventilator 4/18 abdominal distention, CT abdomen and pelvis consistent with ileus and constipation   CC  Severe COPD Severe resp failure  SUBJ Extubated 4/11 Reintubated same day due to worsening respiratory failure  Apparently agreed to tracheostomy and PEG tube Currently sedated, cannot participate in discussion Will likely remain ventilator dependent for life    BP (!) 85/62 (BP Location: Left Arm)   Pulse 66   Temp 99 F (37.2 C) (Bladder)   Resp 18   Ht _0  (1.676 m)   Wt 55.6 kg   SpO2 100%   BMI 19.78 kg/m    I/O last 3 completed shifts: In: 2777.4 [I.V.:1832.4; Other:25; NG/GT:720; IV Piggyback:200] Out: 990 [Urine:990] Total I/O In: 1112.1  [I.V.:707.1; NG/GT:405] Out: 550 [Urine:430; Emesis/NG output:120]  SpO2: 100 % O2 Flow Rate (L/min): 28 L/min FiO2 (%): 28 %  Estimated body mass index is 19.78 kg/m as calculated from the following:   Height as of this encounter: _1  (1.676 m).   Weight as of this encounter: 55.6 kg.  REVIEW OF SYSTEMS  PATIENT IS UNABLE TO PROVIDE COMPLETE REVIEW OF SYSTEMS DUE TO INTUBATED/SEDATED STATUS  PHYSICAL EXAMINATION:  GENERAL: Very thin male, temporal wasting noted, sedated on the ventilator. HEAD: Normocephalic, atraumatic.  EYES: Pupils midpoint equal, round, reactive to light.  No scleral icterus.  MOUTH: Orotracheally intubated.  OG in place.  Oral mucosa moist. NECK: Supple. No thyromegaly. No nodules.  JVD present.  Trachea midline PULMONARY: Very distant breath sounds with very distant end expiratory faint wheezes.  Increased AP diameter. CARDIOVASCULAR: S1 and S2. Regular rate and rhythm.  No murmurs heard. GASTROINTESTINAL: Abdomen is soft, distended, normoactive bowel sounds. MUSCULOSKELETAL: No joint deformity,no edema, + L clubbing about the fingers.  Decreased muscle mass throughout. NEUROLOGIC: Minimally on the ventilator.  When sedation lightened becomes asynchronous. SKIN: Intact,warm,dry.  No rashes noted. PSYCH: Unable to assess, patient on the ventilator.     MEDICATIONS: I have reviewed all medications and confirmed regimen as documented   CULTURE RESULTS   Recent Results (from the past 240 hour(s))  Culture, blood (routine x 2)     Status: Abnormal   Collection Time: 06/15/19  5:00 AM   Specimen:  BLOOD  Result Value Ref Range Status   Specimen Description   Final    BLOOD RIGHT BICEP Performed at [facility], [address] [place], [place] [address]    Special Requests   Final    BOTTLES DRAWN AEROBIC AND ANAEROBIC Blood Culture adequate volume Performed at [facility], [address] [place], [place] [address]     Culture  Setup Time   Final    GRAM POSITIVE COCCI AEROBIC BOTTLE ONLY CRITICAL RESULT CALLED TO, READ BACK BY AND VERIFIED WITH: CHRISTINE KATSOUDAS AT 3818 ON 06/16/2019 [place].    Culture (A)  Final    STAPHYLOCOCCUS SPECIES (COAGULASE NEGATIVE) THE SIGNIFICANCE OF ISOLATING THIS ORGANISM FROM A SINGLE SET OF BLOOD CULTURES WHEN MULTIPLE SETS ARE DRAWN IS UNCERTAIN. PLEASE NOTIFY THE MICROBIOLOGY DEPARTMENT WITHIN ONE WEEK IF SPECIATION AND SENSITIVITIES ARE REQUIRED. Performed at [facility], [place] [address] [place], [place] [address]    Report Status 06/17/2019 FINAL  Final  Culture, blood (routine x 2)     Status: None   Collection Time: 06/15/19  5:00 AM   Specimen: BLOOD  Result Value Ref Range Status   Specimen Description BLOOD LEFT AC  Final   Special Requests   Final    BOTTLES DRAWN AEROBIC AND ANAEROBIC Blood Culture adequate volume   Culture   Final    NO GROWTH 5 DAYS Performed at [facility], [address] [place], [place] [address]    Report Status 06/20/2019 FINAL  Final  Respiratory Panel by RT PCR (Flu A&B, Covid) - Nasopharyngeal Swab     Status: None   Collection Time: 06/15/19  5:00 AM   Specimen: Nasopharyngeal Swab  Result Value Ref Range Status   SARS Coronavirus 2 by RT PCR NEGATIVE NEGATIVE Final    Comment: (NOTE) SARS-CoV-2 target nucleic acids are NOT DETECTED. The SARS-CoV-2 RNA is generally detectable in upper respiratoy specimens during the acute phase of infection. The lowest concentration of SARS-CoV-2 viral copies this assay can detect is 131 copies/mL. A negative result does not preclude SARS-Cov-2 infection and should not be used as the sole basis for treatment or other patient management decisions. A negative result may occur with  improper specimen collection/handling, submission of specimen other than nasopharyngeal swab, presence of viral mutation(s) within the areas targeted by this assay, and inadequate number of  viral copies (<131 copies/mL). A negative result must be combined with clinical observations, patient history, and epidemiological information. The expected result is Negative. Fact Sheet for Patients:  PinkCheek.be Fact Sheet for Healthcare Providers:  GravelBags.it This test is not yet ap proved or cleared by the [place] FDA and  has been authorized for detection and/or diagnosis of SARS-CoV-2 by FDA under an Emergency Use Authorization (EUA). This EUA will remain  in effect (meaning this test can be used) for the duration of the COVID-19 declaration under Section 564(b)(1) of the Act, 21 U.S.C. section 360bbb-3(b)(1), unless the authorization is terminated or revoked sooner.    Influenza A by PCR NEGATIVE NEGATIVE Final   Influenza B by PCR NEGATIVE NEGATIVE Final    Comment: (NOTE) The Xpert Xpress SARS-CoV-2/FLU/RSV assay is intended as an aid in  the diagnosis of influenza from Nasopharyngeal swab specimens and  should not be used as a sole basis for treatment. Nasal washings and  aspirates are unacceptable for Xpert Xpress SARS-CoV-2/FLU/RSV  testing. Fact Sheet for Patients: PinkCheek.be Fact Sheet for Healthcare Providers: GravelBags.it This test is  not yet approved or cleared by the [place] and  has been authorized for detection and/or diagnosis of SARS-CoV-2 by  FDA under an Emergency Use Authorization (EUA). This EUA will remain  in effect (meaning this test can be used) for the duration of the  Covid-19 declaration under Section 564(b)(1) of the Act, 21  U.S.C. section 360bbb-3(b)(1), unless the authorization is  terminated or revoked. Performed at [facility], [address], [place], [place] [address]   Urine Culture     Status: None   Collection Time: 06/15/19  5:00 AM   Specimen: Urine, Random  Result Value Ref Range  Status   Specimen Description   Final    URINE, RANDOM Performed at [facility], [address] [place], [place] [address]    Special Requests   Final    NONE Performed at [facility], [address], [place], [place] [address]    Culture   Final    NO GROWTH Performed at [facility], [place] [address], [place], [place] [address]    Report Status 06/16/2019 FINAL  Final  Blood Culture ID Panel (Reflexed)     Status: Abnormal   Collection Time: 06/15/19  5:00 AM  Result Value Ref Range Status   Enterococcus species NOT DETECTED NOT DETECTED Final   Listeria monocytogenes NOT DETECTED NOT DETECTED Final   Staphylococcus species DETECTED (A) NOT DETECTED Final    Comment: Methicillin (oxacillin) susceptible coagulase negative staphylococcus. Possible blood culture contaminant (unless isolated from more than one blood culture draw or clinical case suggests pathogenicity). No antibiotic treatment is indicated for blood  culture contaminants. CRITICAL RESULT CALLED TO, READ BACK BY AND VERIFIED WITH: CHRISTINE KATSOUDAS AT 2979 ON 06/16/2019 [place].    Staphylococcus aureus (BCID) NOT DETECTED NOT DETECTED Final   Methicillin resistance NOT DETECTED NOT DETECTED Final   Streptococcus species NOT DETECTED NOT DETECTED Final   Streptococcus agalactiae NOT DETECTED NOT DETECTED Final   Streptococcus pneumoniae NOT DETECTED NOT DETECTED Final   Streptococcus pyogenes NOT DETECTED NOT DETECTED Final   Acinetobacter baumannii NOT DETECTED NOT DETECTED Final   Enterobacteriaceae species NOT DETECTED NOT DETECTED Final   Enterobacter cloacae complex NOT DETECTED NOT DETECTED Final   Escherichia coli NOT DETECTED NOT DETECTED Final   Klebsiella oxytoca NOT DETECTED NOT DETECTED Final   Klebsiella pneumoniae NOT DETECTED NOT DETECTED Final   Proteus species NOT DETECTED NOT DETECTED Final   Serratia marcescens NOT DETECTED NOT DETECTED Final   Haemophilus influenzae NOT  DETECTED NOT DETECTED Final   Neisseria meningitidis NOT DETECTED NOT DETECTED Final   Pseudomonas aeruginosa NOT DETECTED NOT DETECTED Final   Candida albicans NOT DETECTED NOT DETECTED Final   Candida glabrata NOT DETECTED NOT DETECTED Final   Candida krusei NOT DETECTED NOT DETECTED Final   Candida parapsilosis NOT DETECTED NOT DETECTED Final   Candida tropicalis NOT DETECTED NOT DETECTED Final    Comment: Performed at [facility], [address] [place], [place] [address]  MRSA PCR Screening     Status: None   Collection Time: 06/15/19 11:36 AM   Specimen: Nasopharyngeal  Result Value Ref Range Status   MRSA by PCR NEGATIVE NEGATIVE Final    Comment:        The GeneXpert MRSA Assay (FDA approved for NASAL specimens only), is one component of a comprehensive MRSA colonization surveillance program. It is not intended to diagnose MRSA infection nor to guide or monitor treatment  for MRSA infections. Performed at [facility], [address] [place],  [address]         BMP Latest Ref Rng & Units 06/20/2019 06/19/2019 06/18/2019  Glucose 70 - 99 mg/dL 101(H) 83 109(H)  BUN 6 - 20 mg/dL 22(H) 28(H) 27(H)  Creatinine 0.61 - 1.24 mg/dL 0.48(L) 0.53(L) 0.57(L)  Sodium 135 - 145 mmol/L 136 140 139  Potassium 3.5 - 5.1 mmol/L 3.4(L) 3.5 4.0  Chloride 98 - 111 mmol/L 91(L) 92(L) 94(L)  CO2 22 - 32 mmol/L 35(H) 36(H) 33(H)  Calcium 8.9 - 10.3 mg/dL 8.8(L) 8.9 9.2         Indwelling Urinary Catheter continued, requirement due to   Reason to continue Indwelling Urinary Catheter strict Intake/Output monitoring for hemodynamic instability         Ventilator continued, requirement due to severe respiratory failure   Ventilator Sedation RASS 0 to -2      ASSESSMENT AND PLAN SYNOPSIS  Acute on chronic hypercarbic/hypoxic respiratory failure COPD exacerbation No evidence of active infection Continue ventilator support Wake up assessment and SBT  in the morning if meets criteria Reassess goals of care his prognosis is very poor Apparently agreed to tracheostomy however, was in distress at the time Will likely be ventilator dependent for life Bronchodilators: DuoNeb every 4 and as needed IV corticosteroids There continues to be no indication for antibiotics at present Met with family today to establish goals of care  Severe COPD stage IV with exacerbation Noted noncompliance with oxygen/BiPAP at home Bronchodilators as above IV corticosteroids Patient is DNR however, agreed to tracheostomy and PEG placement He will likely remain ventilator dependent for life Establish goals of care with family (sister, daughter) and significant other today, no decision  Acute encephalopathy Unresponsiveness Due to hypercarbia Has resolved Requires sedation now for ventilator comfort, asynchrony CT head negative on admission  Modestly elevated troponin Due to demand ischemia Decompensation of cor pulmonale Trend troponins, no further elevation Supportive care  Cor pulmonale with decompensation Pulmonary hypertension Management of issues as above Poor prognostic sign  Hypoglycemia Due to poor nutritional status Continue tube feeds Add D10 ICU hypoglycemia protocol Poor prognostic sign  Acute kidney injury  Avoid nephrotoxins Stable with no worsening Trend renal panel  Hyperkalemia Likely triggered by acidosis RESOLVED  ID Procalcitonin normal No infiltrate on chest x-ray No indication for antibiotics at present Continue to trend WBC/fever curve Had 1 single blood culture of MRSA likely contaminant [name] MRSA PCR negative  Abdominal distention Ileus CT abdomen and pelvis: No obstruction: No free air Findings likely due to ileus, constipation Bowel regimen  Prophylaxis: Lovenox Famotidine   Critical Care Time devoted to patient care services described in this note is 40 minutes.  Additional time and  meeting with family total meeting time 50 minutes.  Coordination was done with the bedside nurse.    Goals of care discussion: Patient is currently DNR/DNI.  Discussed goals of care with patient's sister, daughter, and significant other.  Additionally, brother-in-law was present as well.  We discussed the patient's poor functional status at baseline in which he is exceedingly limited to perform activities of daily living without debilitating dyspnea.  We discussed that as of the last pulmonary function test in 2019 patient's FEV1 was only 20% and that likely this has declined since then.  Diffusion capacity at that time was 33%.  We also discussed the presence of cor pulmonale and what implications this condition has vis--vis his COPD.  We also  discussed the patient's persistent hypercarbia due to end-stage COPD.  They understand that tracheostomy will not be curative they also understand that the most likely scenario is that the patient will remain ventilator dependent for life.  The patient continues to be DNR/DNI.  Family is pondering whether to proceed with tracheostomy or transition to comfort care.  The patient on prior admission had indicated that he did not want intubation nor prolonged mechanical ventilation or heroic measures to prolong life.   Renold Don, MD [facility]  *This note was dictated using voice recognition software/Dragon.  Despite best efforts to proofread, errors can occur which can change the meaning.  Any change was purely unintentional.

## 2019-06-21 ENCOUNTER — Inpatient Hospital Stay: Payer: Medicaid Other

## 2019-06-21 LAB — BASIC METABOLIC PANEL
Anion gap: 5 (ref 5–15)
BUN: 16 mg/dL (ref 6–20)
CO2: 40 mmol/L — ABNORMAL HIGH (ref 22–32)
Calcium: 8.6 mg/dL — ABNORMAL LOW (ref 8.9–10.3)
Chloride: 96 mmol/L — ABNORMAL LOW (ref 98–111)
Creatinine, Ser: 0.48 mg/dL — ABNORMAL LOW (ref 0.61–1.24)
GFR calc Af Amer: >60 mL/min (ref >60)
GFR calc non Af Amer: >60 mL/min (ref >60)
Glucose, Bld: 89 mg/dL (ref 70–99)
Potassium: 4 mmol/L (ref 3.5–5.1)
Sodium: 141 mmol/L (ref 135–145)

## 2019-06-21 LAB — CBC WITH DIFFERENTIAL/PLATELET
Abs Immature Granulocytes: 0.1 10*3/uL — ABNORMAL HIGH (ref 0.00–0.07)
Basophils Absolute: 0 10*3/uL (ref 0.0–0.1)
Basophils Relative: 0 %
Eosinophils Absolute: 0.3 10*3/uL (ref 0.0–0.5)
Eosinophils Relative: 3 %
HCT: 29.1 % — ABNORMAL LOW (ref 39.0–52.0)
Hemoglobin: 9.1 g/dL — ABNORMAL LOW (ref 13.0–17.0)
Immature Granulocytes: 1 %
Lymphocytes Relative: 14 %
Lymphs Abs: 1.3 10*3/uL (ref 0.7–4.0)
MCH: 31.2 pg (ref 26.0–34.0)
MCHC: 31.3 g/dL (ref 30.0–36.0)
MCV: 99.7 fL (ref 80.0–100.0)
Monocytes Absolute: 1.1 10*3/uL — ABNORMAL HIGH (ref 0.1–1.0)
Monocytes Relative: 12 %
Neutro Abs: 6.5 10*3/uL (ref 1.7–7.7)
Neutrophils Relative %: 70 %
Platelets: 87 10*3/uL — ABNORMAL LOW (ref 150–400)
RBC: 2.92 MIL/uL — ABNORMAL LOW (ref 4.22–5.81)
RDW: 13.7 % (ref 11.5–15.5)
WBC: 9.4 10*3/uL (ref 4.0–10.5)
nRBC: 0 % (ref 0.0–0.2)

## 2019-06-21 LAB — GLUCOSE, CAPILLARY
Glucose-Capillary: 83 mg/dL (ref 70–99)
Glucose-Capillary: 108 mg/dL — ABNORMAL HIGH (ref 70–99)
Glucose-Capillary: 109 mg/dL — ABNORMAL HIGH (ref 70–99)
Glucose-Capillary: 66 mg/dL — ABNORMAL LOW (ref 70–99)
Glucose-Capillary: 78 mg/dL (ref 70–99)
Glucose-Capillary: 99 mg/dL (ref 70–99)

## 2019-06-21 LAB — TRIGLYCERIDES: Triglycerides: 67 mg/dL (ref <150)

## 2019-06-21 LAB — MAGNESIUM: Magnesium: 1.9 mg/dL (ref 1.7–2.4)

## 2019-06-21 LAB — PHOSPHORUS: Phosphorus: 4.6 mg/dL (ref 2.5–4.6)

## 2019-06-21 NOTE — Progress Notes (Signed)
PHARMACY CONSULT NOTE  Pharmacy Consult for Electrolyte Monitoring and Replacement   Recent Labs: Potassium (mmol/L)  Date Value  06/21/2019 4.0  09/24/2013 4.6   Magnesium (mg/dL)  Date Value  43/60/1658 1.9   Calcium (mg/dL)  Date Value  00/63/4949 8.6 (L)   Calcium, Total (mg/dL)  Date Value  44/73/9584 8.9   Albumin (g/dL)  Date Value  41/71/2787 3.5   Phosphorus (mg/dL)  Date Value  18/36/7255 4.6   Sodium (mmol/L)  Date Value  06/21/2019 141  09/24/2013 138     Assessment: 49 year old male admitted after being found unresponsive. Patient was intubated in the ED and transferred to the ICU. Patient started on tube feeds and at refeeding risk.  Patient extubated 4/15 am and placed on BiPAP. Patient required reintubation. Awaiting trach placement.  Goal of Therapy:  Electrolytes WNL  Plan:  Electrolytes WNL.   Phos, mag ordered daily. Will assess need for BMP as necessary. Will continue to monitor.  Pricilla Riffle, PharmD Clinical Pharmacist 06/21/2019 11:40 AM

## 2019-06-21 NOTE — Progress Notes (Signed)
CRITICAL CARE NOTE  49 year old former smoker with a history very severe COPD stage IV, who presented via EMS after being found down and unresponsive.  Apparently the patient's mother placed a call to EMS.  The patient has a longstanding history of COPD and noncompliance with his oxygen and CPAP.  History could not be obtained from the patient as there was no family available and the patient had been intubated and was mechanically ventilated in the ED.  Of note he had recently been admitted to [facility] 29 May 2019 through 03 June 2019.  He had been evaluated by palliative care during this admission and actually had a DNR order.   Patient also had a 2D echo  with pulmonary hypertension consistent with cor pulmonale.  Records review: Patient has been following with Dr. Ned Clines at [facility].  Last known PFTs 11 September 2017.  FEV1 0.52 L or 20% predicted, FVC 1.49 L or 45% of predicted, FEV1/FVC 35%.  FEF 25-75 6%.  DLCO was 33%.  This is consistent with very severe COPD.  Events: 4/13 admitted for severe resp failure from COPD 4/14 remains critically ill on vent 4/15 remains critically ill, severe COPD, failed SAT/SBT 4/16 extubated yesterday, had to be reintubated remains on ventilator 4/17 sedated on the ventilator 4/18 abdominal distention, CT abdomen and pelvis consistent with ileus and constipation 4/19-patietn remains on ventialtor awainting trach   CC  Severe COPD Severe resp failure  SUBJ Extubated 4/11 Reintubated same day due to worsening respiratory failure  Apparently agreed to tracheostomy and PEG tube Currently sedated, cannot participate in discussion Will likely remain ventilator dependent for life    BP 91/64 (BP Location: Left Arm)   Pulse 60   Temp 98.8 F (37.1 C) (Bladder)   Resp 18   Ht 5\' 6"  (1.676 m)   Wt 55.6 kg   SpO2 100%   BMI 19.78 kg/m    I/O last 3 completed shifts: In: 3839.7 [I.V.:2824.7; NG/GT:965; IV Piggyback:50] Out: 2150  [Urine:2150] Total I/O In: 680.1 [I.V.:380.1; NG/GT:250; IV Piggyback:50] Out: 510 [Urine:510]  SpO2: 100 % O2 Flow Rate (L/min): 28 L/min FiO2 (%): 28 %  Estimated body mass index is 19.78 kg/m as calculated from the following:   Height as of this encounter: 5\' 6"  (1.676 m).   Weight as of this encounter: 55.6 kg.  REVIEW OF SYSTEMS  PATIENT IS UNABLE TO PROVIDE COMPLETE REVIEW OF SYSTEMS DUE TO INTUBATED/SEDATED STATUS  PHYSICAL EXAMINATION:  GENERAL: Very thin male, temporal wasting noted, sedated on the ventilator. HEAD: Normocephalic, atraumatic.  EYES: Pupils midpoint equal, round, reactive to light.  No scleral icterus.  MOUTH: Orotracheally intubated.  OG in place.  Oral mucosa moist. NECK: Supple. No thyromegaly. No nodules.  JVD present.  Trachea midline PULMONARY: Very distant breath sounds with very distant end expiratory faint wheezes.  Increased AP diameter. CARDIOVASCULAR: S1 and S2. Regular rate and rhythm.  No murmurs heard. GASTROINTESTINAL: Abdomen is soft, distended, normoactive bowel sounds. MUSCULOSKELETAL: No joint deformity,no edema, + L clubbing about the fingers.  Decreased muscle mass throughout. NEUROLOGIC: Minimally on the ventilator.  When sedation lightened becomes asynchronous. SKIN: Intact,warm,dry.  No rashes noted. PSYCH: Unable to assess, patient on the ventilator.     MEDICATIONS: I have reviewed all medications and confirmed regimen as documented   CULTURE RESULTS   Recent Results (from the past 240 hour(s))  Culture, blood (routine x 2)     Status: Abnormal   Collection Time: 06/15/19  5:00  AM   Specimen: BLOOD  Result Value Ref Range Status   Specimen Description   Final    BLOOD RIGHT BICEP Performed at [facility], [address] [place], [place] [address]    Special Requests   Final    BOTTLES DRAWN AEROBIC AND ANAEROBIC Blood Culture adequate volume Performed at [facility], [address]  [place], [place] [address]    Culture  Setup Time   Final    GRAM POSITIVE COCCI AEROBIC BOTTLE ONLY CRITICAL RESULT CALLED TO, READ BACK BY AND VERIFIED WITH: CHRISTINE KATSOUDAS AT 2951 ON 06/16/2019 [place].    Culture (A)  Final    STAPHYLOCOCCUS SPECIES (COAGULASE NEGATIVE) THE SIGNIFICANCE OF ISOLATING THIS ORGANISM FROM A SINGLE SET OF BLOOD CULTURES WHEN MULTIPLE SETS ARE DRAWN IS UNCERTAIN. PLEASE NOTIFY THE MICROBIOLOGY DEPARTMENT WITHIN ONE WEEK IF SPECIATION AND SENSITIVITIES ARE REQUIRED. Performed at [facility], [place] [address] [place], [place] [address]    Report Status 06/17/2019 FINAL  Final  Culture, blood (routine x 2)     Status: None   Collection Time: 06/15/19  5:00 AM   Specimen: BLOOD  Result Value Ref Range Status   Specimen Description BLOOD LEFT AC  Final   Special Requests   Final    BOTTLES DRAWN AEROBIC AND ANAEROBIC Blood Culture adequate volume   Culture   Final    NO GROWTH 5 DAYS Performed at [facility], [address] [place],  [address]    Report Status 06/20/2019 FINAL  Final  Respiratory Panel by RT PCR (Flu A&B, Covid) - Nasopharyngeal Swab     Status: None   Collection Time: 06/15/19  5:00 AM   Specimen: Nasopharyngeal Swab  Result Value Ref Range Status   SARS Coronavirus 2 by RT PCR NEGATIVE NEGATIVE Final    Comment: (NOTE) SARS-CoV-2 target nucleic acids are NOT DETECTED. The SARS-CoV-2 RNA is generally detectable in upper respiratoy specimens during the acute phase of infection. The lowest concentration of SARS-CoV-2 viral copies this assay can detect is 131 copies/mL. A negative result does not preclude SARS-Cov-2 infection and should not be used as the sole basis for treatment or other patient management decisions. A negative result may occur with  improper specimen collection/handling, submission of specimen other than nasopharyngeal swab, presence of viral mutation(s) within the areas targeted by this assay,  and inadequate number of viral copies (<131 copies/mL). A negative result must be combined with clinical observations, patient history, and epidemiological information. The expected result is Negative. Fact Sheet for Patients:  PinkCheek.be Fact Sheet for Healthcare Providers:  GravelBags.it This test is not yet ap proved or cleared by the [place] FDA and  has been authorized for detection and/or diagnosis of SARS-CoV-2 by FDA under an Emergency Use Authorization (EUA). This EUA will remain  in effect (meaning this test can be used) for the duration of the COVID-19 declaration under Section 564(b)(1) of the Act, 21 U.S.C. section 360bbb-3(b)(1), unless the authorization is terminated or revoked sooner.    Influenza A by PCR NEGATIVE NEGATIVE Final   Influenza B by PCR NEGATIVE NEGATIVE Final    Comment: (NOTE) The Xpert Xpress SARS-CoV-2/FLU/RSV assay is intended as an aid in  the diagnosis of influenza from Nasopharyngeal swab specimens and  should not be used as a sole basis for treatment. Nasal washings and  aspirates are unacceptable for Xpert Xpress SARS-CoV-2/FLU/RSV  testing. Fact Sheet for Patients: PinkCheek.be Fact Sheet for Healthcare Providers:  https://www.young.biz/ This test is not yet approved or cleared by the [place] and  has been authorized for detection and/or diagnosis of SARS-CoV-2 by  FDA under an Emergency Use Authorization (EUA). This EUA will remain  in effect (meaning this test can be used) for the duration of the  Covid-19 declaration under Section 564(b)(1) of the Act, 21  U.S.C. section 360bbb-3(b)(1), unless the authorization is  terminated or revoked. Performed at [facility], [address], [place], [place] [address]   Urine Culture     Status: None   Collection Time: 06/15/19  5:00 AM   Specimen: Urine, Random   Result Value Ref Range Status   Specimen Description   Final    URINE, RANDOM Performed at [facility], [address], [place], [place] [address]    Special Requests   Final    NONE Performed at [facility], [address], [place], [place] [address]    Culture   Final    NO GROWTH Performed at [facility], [address], [place], [place] [address]    Report Status 06/16/2019 FINAL  Final  Blood Culture ID Panel (Reflexed)     Status: Abnormal   Collection Time: 06/15/19  5:00 AM  Result Value Ref Range Status   Enterococcus species NOT DETECTED NOT DETECTED Final   Listeria monocytogenes NOT DETECTED NOT DETECTED Final   Staphylococcus species DETECTED (A) NOT DETECTED Final    Comment: Methicillin (oxacillin) susceptible coagulase negative staphylococcus. Possible blood culture contaminant (unless isolated from more than one blood culture draw or clinical case suggests pathogenicity). No antibiotic treatment is indicated for blood  culture contaminants. CRITICAL RESULT CALLED TO, READ BACK BY AND VERIFIED WITH: CHRISTINE KATSOUDAS AT 0753 ON 06/16/2019 [facility].    Staphylococcus aureus (BCID) NOT DETECTED NOT DETECTED Final   Methicillin resistance NOT DETECTED NOT DETECTED Final   Streptococcus species NOT DETECTED NOT DETECTED Final   Streptococcus agalactiae NOT DETECTED NOT DETECTED Final   Streptococcus pneumoniae NOT DETECTED NOT DETECTED Final   Streptococcus pyogenes NOT DETECTED NOT DETECTED Final   Acinetobacter baumannii NOT DETECTED NOT DETECTED Final   Enterobacteriaceae species NOT DETECTED NOT DETECTED Final   Enterobacter cloacae complex NOT DETECTED NOT DETECTED Final   Escherichia coli NOT DETECTED NOT DETECTED Final   Klebsiella oxytoca NOT DETECTED NOT DETECTED Final   Klebsiella pneumoniae NOT DETECTED NOT DETECTED Final   Proteus species NOT DETECTED NOT DETECTED Final   Serratia marcescens NOT DETECTED NOT DETECTED Final    Haemophilus influenzae NOT DETECTED NOT DETECTED Final   Neisseria meningitidis NOT DETECTED NOT DETECTED Final   Pseudomonas aeruginosa NOT DETECTED NOT DETECTED Final   Candida albicans NOT DETECTED NOT DETECTED Final   Candida glabrata NOT DETECTED NOT DETECTED Final   Candida krusei NOT DETECTED NOT DETECTED Final   Candida parapsilosis NOT DETECTED NOT DETECTED Final   Candida tropicalis NOT DETECTED NOT DETECTED Final    Comment: Performed at [facility], [address] [place], [place] [address]  MRSA PCR Screening     Status: None   Collection Time: 06/15/19 11:36 AM   Specimen: Nasopharyngeal  Result Value Ref Range Status   MRSA by PCR NEGATIVE NEGATIVE Final    Comment:        The GeneXpert MRSA Assay (FDA approved for NASAL specimens only), is one component of a comprehensive MRSA colonization surveillance program. It is not intended to diagnose MRSA infection nor to  guide or monitor treatment for MRSA infections. Performed at [facility], [address] [place], [place] [address]         BMP Latest Ref Rng & Units 06/21/2019 06/20/2019 06/19/2019  Glucose 70 - 99 mg/dL 89 665(L) 83  BUN 6 - 20 mg/dL 16 93(T) 70(V)  Creatinine 0.61 - 1.24 mg/dL 7.79(T) 9.03(E) 0.92(Z)  Sodium 135 - 145 mmol/L 141 136 140  Potassium 3.5 - 5.1 mmol/L 4.0 3.4(L) 3.5  Chloride 98 - 111 mmol/L 96(L) 91(L) 92(L)  CO2 22 - 32 mmol/L 40(H) 35(H) 36(H)  Calcium 8.9 - 10.3 mg/dL 3.0(Q) 7.6(A) 8.9         Indwelling Urinary Catheter continued, requirement due to   Reason to continue Indwelling Urinary Catheter strict Intake/Output monitoring for hemodynamic instability         Ventilator continued, requirement due to severe respiratory failure   Ventilator Sedation RASS 0 to -2      ASSESSMENT AND PLAN SYNOPSIS  Acute on chronic hypercarbic/hypoxic respiratory failure COPD exacerbation No evidence of active infection Continue ventilator  support Wake up assessment and SBT in the morning if meets criteria Reassess goals of care his prognosis is very poor Agreed to tracheostomy  Bronchodilators: DuoNeb every 4 and as needed IV corticosteroids There continues to be no indication for antibiotics at present  Severe COPD stage IV with exacerbation Noted noncompliance with oxygen/BiPAP at home Bronchodilators as above IV corticosteroids Patient is DNR however, agreed to tracheostomy and PEG placement He will likely remain ventilator dependent for life Establish goals of care with family (sister, daughter) and significant other today, no decision  Acute encephalopathy Unresponsiveness Due to hypercarbia Has resolved Requires sedation now for ventilator comfort, asynchrony CT head negative on admission  Modestly elevated troponin Due to demand ischemia Decompensation of cor pulmonale Trend troponins, no further elevation Supportive care  Cor pulmonale with decompensation Pulmonary hypertension Management of issues as above Poor prognostic sign  Hypoglycemia Due to poor nutritional status Continue tube feeds Add D10 ICU hypoglycemia protocol Poor prognostic sign  Acute kidney injury  Avoid nephrotoxins Stable with no worsening Trend renal panel  Hyperkalemia Likely triggered by acidosis RESOLVED  ID Procalcitonin normal No infiltrate on chest x-ray No indication for antibiotics at present Continue to trend WBC/fever curve Had 1 single blood culture of MRSA likely contaminant [name] MRSA PCR negative  Abdominal distention Ileus CT abdomen and pelvis: No obstruction: No free air Findings likely due to ileus, constipation Bowel regimen  Prophylaxis: Lovenox Famotidine    Critical care provider statement:    Critical care time (minutes):  35   Critical care time was exclusive of:  Separately billable procedures and  treating other patients   Critical care was necessary to treat or  prevent imminent or  life-threatening deterioration of the following conditions:  Acute hypercapnic respiratory failure requiring tracheostomy   Critical care was time spent personally by me on the following  activities:  Development of treatment plan with patient or surrogate,  discussions with consultants, evaluation of patient's response to  treatment, examination of patient, obtaining history from patient or  surrogate, ordering and performing treatments and interventions, ordering  and review of laboratory studies and re-evaluation of patient's condition   I assumed direction of critical care for this patient from another  provider in my specialty: no       Vida Rigger, M.D.  Pulmonary & Critical Care Medicine  [facility]

## 2019-06-22 ENCOUNTER — Encounter
Admission: EM | Disposition: A | Discharge status: Skilled Nursing Facility | Payer: Self-pay | Source: Home / Self Care | Attending: Internal Medicine

## 2019-06-22 ENCOUNTER — Inpatient Hospital Stay: Payer: Medicaid Other | Admitting: Anesthesiology

## 2019-06-22 DIAGNOSIS — E43 Unspecified severe protein-calorie malnutrition: Secondary | ICD-10-CM

## 2019-06-22 HISTORY — PX: TRACHEOSTOMY TUBE PLACEMENT: SHX814

## 2019-06-22 LAB — BASIC METABOLIC PANEL
Anion gap: 5 (ref 5–15)
BUN: 15 mg/dL (ref 6–20)
CO2: 37 mmol/L — ABNORMAL HIGH (ref 22–32)
Calcium: 8.7 mg/dL — ABNORMAL LOW (ref 8.9–10.3)
Chloride: 99 mmol/L (ref 98–111)
Creatinine, Ser: 0.53 mg/dL — ABNORMAL LOW (ref 0.61–1.24)
GFR calc Af Amer: >60 mL/min (ref >60)
GFR calc non Af Amer: >60 mL/min (ref >60)
Glucose, Bld: 95 mg/dL (ref 70–99)
Potassium: 3.7 mmol/L (ref 3.5–5.1)
Sodium: 141 mmol/L (ref 135–145)

## 2019-06-22 LAB — CBC WITH DIFFERENTIAL/PLATELET
Abs Immature Granulocytes: 0.13 10*3/uL — ABNORMAL HIGH (ref 0.00–0.07)
Basophils Absolute: 0 10*3/uL (ref 0.0–0.1)
Basophils Relative: 0 %
Eosinophils Absolute: 0.2 10*3/uL (ref 0.0–0.5)
Eosinophils Relative: 3 %
HCT: 29.2 % — ABNORMAL LOW (ref 39.0–52.0)
Hemoglobin: 9 g/dL — ABNORMAL LOW (ref 13.0–17.0)
Immature Granulocytes: 2 %
Lymphocytes Relative: 17 %
Lymphs Abs: 1.4 10*3/uL (ref 0.7–4.0)
MCH: 30.9 pg (ref 26.0–34.0)
MCHC: 30.8 g/dL (ref 30.0–36.0)
MCV: 100.3 fL — ABNORMAL HIGH (ref 80.0–100.0)
Monocytes Absolute: 1.1 10*3/uL — ABNORMAL HIGH (ref 0.1–1.0)
Monocytes Relative: 13 %
Neutro Abs: 5.2 10*3/uL (ref 1.7–7.7)
Neutrophils Relative %: 65 %
Platelets: 104 10*3/uL — ABNORMAL LOW (ref 150–400)
RBC: 2.91 MIL/uL — ABNORMAL LOW (ref 4.22–5.81)
RDW: 13.8 % (ref 11.5–15.5)
WBC: 8 10*3/uL (ref 4.0–10.5)
nRBC: 0 % (ref 0.0–0.2)

## 2019-06-22 LAB — GLUCOSE, CAPILLARY
Glucose-Capillary: 113 mg/dL — ABNORMAL HIGH (ref 70–99)
Glucose-Capillary: 116 mg/dL — ABNORMAL HIGH (ref 70–99)
Glucose-Capillary: 79 mg/dL (ref 70–99)
Glucose-Capillary: 84 mg/dL (ref 70–99)
Glucose-Capillary: 85 mg/dL (ref 70–99)
Glucose-Capillary: 90 mg/dL (ref 70–99)
Glucose-Capillary: 97 mg/dL (ref 70–99)

## 2019-06-22 LAB — PHOSPHORUS: Phosphorus: 4.2 mg/dL (ref 2.5–4.6)

## 2019-06-22 LAB — MAGNESIUM: Magnesium: 1.9 mg/dL (ref 1.7–2.4)

## 2019-06-22 SURGERY — CREATION, TRACHEOSTOMY
Anesthesia: General

## 2019-06-22 MED ORDER — DEXAMETHASONE SODIUM PHOSPHATE 10 MG/ML IJ SOLN
INTRAMUSCULAR | Status: DC | PRN
  Administered 2019-06-22: 5 mg via INTRAVENOUS

## 2019-06-22 MED ORDER — LIDOCAINE-EPINEPHRINE 1 %-1:100000 IJ SOLN
INTRAMUSCULAR | Status: AC
  Filled 2019-06-22: qty 1

## 2019-06-22 MED ORDER — MIDAZOLAM HCL 2 MG/2ML IJ SOLN
INTRAMUSCULAR | Status: AC
  Filled 2019-06-22: qty 2

## 2019-06-22 MED ORDER — FENTANYL CITRATE (PF) 100 MCG/2ML IJ SOLN
INTRAMUSCULAR | Status: DC | PRN
  Administered 2019-06-22: 100 ug via INTRAVENOUS

## 2019-06-22 MED ORDER — LACTATED RINGERS IV SOLN: INTRAVENOUS | Status: DC | PRN

## 2019-06-22 MED ORDER — SEVOFLURANE IN SOLN
RESPIRATORY_TRACT | Status: AC
  Filled 2019-06-22: qty 250

## 2019-06-22 MED ORDER — PHENYLEPHRINE HCL (PRESSORS) 10 MG/ML IV SOLN
INTRAVENOUS | Status: DC | PRN
  Administered 2019-06-22 (×2): 100 ug via INTRAVENOUS

## 2019-06-22 MED ORDER — ROCURONIUM BROMIDE 100 MG/10ML IV SOLN
INTRAVENOUS | Status: DC | PRN
  Administered 2019-06-22: 25 mg via INTRAVENOUS

## 2019-06-22 MED ORDER — HEMOSTATIC AGENTS (NO CHARGE) OPTIME
TOPICAL | Status: DC | PRN
  Administered 2019-06-22: 1 via TOPICAL

## 2019-06-22 MED ORDER — ONDANSETRON HCL 4 MG/2ML IJ SOLN
INTRAMUSCULAR | Status: DC | PRN
  Administered 2019-06-22: 4 mg via INTRAVENOUS

## 2019-06-22 MED ORDER — FENTANYL CITRATE (PF) 100 MCG/2ML IJ SOLN
INTRAMUSCULAR | Status: AC
  Filled 2019-06-22: qty 2

## 2019-06-22 MED ORDER — LIDOCAINE-EPINEPHRINE 1 %-1:100000 IJ SOLN
INTRAMUSCULAR | Status: DC | PRN
  Administered 2019-06-22: 6 mL

## 2019-06-22 MED ORDER — PROPOFOL 10 MG/ML IV BOLUS
INTRAVENOUS | Status: AC
  Filled 2019-06-22: qty 20

## 2019-06-22 SURGICAL SUPPLY — 30 items
BLADE SURG 15 STRL LF DISP TIS (BLADE) ×1 IMPLANT
BLADE SURG 15 STRL SS (BLADE) ×2
BLADE SURG SZ11 CARB STEEL (BLADE) ×3 IMPLANT
CANISTER SUCT 1200ML W/VALVE (MISCELLANEOUS) ×3 IMPLANT
CATH SCT 14FR 2 3ANG EYE CNTRL (CATHETERS) ×1 IMPLANT
CATH SUCT 14FR (CATHETERS) ×2
COVER WAND RF STERILE (DRAPES) ×3 IMPLANT
ELECT REM PT RETURN 9FT ADLT (ELECTROSURGICAL) ×3
ELECTRODE REM PT RTRN 9FT ADLT (ELECTROSURGICAL) ×1 IMPLANT
GLOVE BIO SURGEON STRL SZ7.5 (GLOVE) ×3 IMPLANT
GOWN STRL REUS W/ TWL LRG LVL3 (GOWN DISPOSABLE) ×2 IMPLANT
GOWN STRL REUS W/TWL LRG LVL3 (GOWN DISPOSABLE) ×4
HEMOSTAT SURGICEL 2X3 (HEMOSTASIS) ×3 IMPLANT
HLDR TRACH TUBE NECKBAND 18 (MISCELLANEOUS) ×1 IMPLANT
HOLDER TRACH TUBE NECKBAND 18 (MISCELLANEOUS) ×2
KIT TURNOVER KIT A (KITS) ×3 IMPLANT
LABEL OR SOLS (LABEL) ×3 IMPLANT
NS IRRIG 500ML POUR BTL (IV SOLUTION) ×3 IMPLANT
PACK HEAD/NECK (MISCELLANEOUS) ×3 IMPLANT
SHEARS HARMONIC 9CM CVD (BLADE) ×3 IMPLANT
SPONGE DRAIN TRACH 4X4 STRL 2S (GAUZE/BANDAGES/DRESSINGS) ×3 IMPLANT
SPONGE KITTNER 5P (MISCELLANEOUS) ×3 IMPLANT
SUCTION FRAZIER HANDLE 10FR (MISCELLANEOUS) ×2
SUCTION TUBE FRAZIER 10FR DISP (MISCELLANEOUS) ×1 IMPLANT
SUT ETHILON 2 0 FS 18 (SUTURE) ×6 IMPLANT
SUT SILK 2 0 (SUTURE)
SUT SILK 2-0 18XBRD TIE 12 (SUTURE) IMPLANT
SUT VIC AB 3-0 PS2 18 (SUTURE) ×3 IMPLANT
TUBE TRACH SHILEY  6 DIST  CUF (TUBING) IMPLANT
TUBE TRACH SHILEY 8 DIST CUF (TUBING) ×3 IMPLANT

## 2019-06-22 NOTE — Anesthesia Preprocedure Evaluation (Signed)
Anesthesia Evaluation  Patient identified by MRN, date of birth, ID band Patient unresponsive    Reviewed: reviewed documented beta blocker date and time   History of Anesthesia Complications Negative for: history of anesthetic complications  Airway Mallampati: Intubated       Dental   Pulmonary COPD,  COPD inhaler and oxygen dependent, former smoker,           Cardiovascular   PHTN, Cor polmonale   Neuro/Psych Pt sedated on vent    GI/Hepatic   Endo/Other    Renal/GU      Musculoskeletal   Abdominal   Peds  Hematology   Anesthesia Other Findings   Reproductive/Obstetrics                             Anesthesia Physical Anesthesia Plan  ASA: III  Anesthesia Plan: General   Post-op Pain Management:    Induction: Intravenous  PONV Risk Score and Plan: Treatment may vary due to age or medical condition  Airway Management Planned: Oral ETT and Tracheostomy  Additional Equipment:   Intra-op Plan:   Post-operative Plan: Post-operative intubation/ventilation  Informed Consent: I have reviewed the patients History and Physical, chart, labs and discussed the procedure including the risks, benefits and alternatives for the proposed anesthesia with the patient or authorized representative who has indicated his/her understanding and acceptance.    Discussed DNR with power of attorney and Suspend DNR.     Plan Discussed with:   Anesthesia Plan Comments:         Anesthesia Quick Evaluation

## 2019-06-22 NOTE — Progress Notes (Signed)
Pt has remained stable throughout shift. Has periods of agitation and wanting to get out of bed. After reorienting and prn meds pt calms down. Pt has disconneced his trach twice and pulled off his Chanci Ojala as well as tried to get out of bed. Pt currently resting comfortably in bed with Rondle Lohse on and watching spongebob per his request.

## 2019-06-22 NOTE — Progress Notes (Signed)
PHARMACY CONSULT NOTE  Pharmacy Consult for Electrolyte Monitoring and Replacement   Recent Labs: Potassium (mmol/L)  Date Value  06/22/2019 3.7  09/24/2013 4.6   Magnesium (mg/dL)  Date Value  73/34/4830 1.9   Calcium (mg/dL)  Date Value  15/99/6895 8.7 (L)   Calcium, Total (mg/dL)  Date Value  70/22/0266 8.9   Albumin (g/dL)  Date Value  91/67/5612 3.5   Phosphorus (mg/dL)  Date Value  54/83/2346 4.2   Sodium (mmol/L)  Date Value  06/22/2019 141  09/24/2013 138     Assessment: 49 year old male admitted after being found unresponsive. Patient was intubated in the ED and transferred to the ICU. Patient started on tube feeds and at refeeding risk.  Patient extubated 4/15 am and placed on BiPAP. Patient required reintubation.   Trach 4/20. Patient without access for tube feeding at this time.  Goal of Therapy:  Electrolytes WNL  Plan:  Electrolytes WNL. Defer replacement. Labs have been ordered daily. Will follow and replace as indicated.  Pricilla Riffle, PharmD Clinical Pharmacist 06/22/2019 2:07 PM

## 2019-06-22 NOTE — Consult Note (Signed)
Patient ID: Chris Case, male   DOB: April 08, 1970, 49 y.o.   MRN: [id number]  HPI Chris Case is a 49 y.o. male seen in consultation at the request of Dr. Lanney Gins . Hx severe COPD, vent dependent. Found unresponsive. Unable to take PO. History obtained from the chart. I tired multiple times to contact the sister but it goes to voice mail. Hx cor pulmonale. Failed SBT and having a trach today.  Ct scan pers reviewed and there is colon in front of the stomach. No perforation no SBO. D/W Dr. Marius Ditch who is requesting surgical G tube.   previous laparotomy 2018 for SBO Hb, Pl 104,  Bmp CO2 34 creat 0.5    HPI  Past Medical History:  Diagnosis Date  . COPD (chronic obstructive pulmonary disease) ([place])   . Emphysema lung ([place])   . 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 APPENDECTOMY  late 1980's  . LAPAROTOMY N/A 12/06/2016   Procedure: EXPLORATORY LAPAROTOMY- SBO;  Surgeon: Florene Glen, MD;  Location: [facility] ORS;  Service: General;  Laterality: N/A;  . TRACHEOSTOMY TUBE PLACEMENT N/A 06/22/2019   Procedure: TRACHEOSTOMY;  Surgeon: Carloyn Manner, MD;  Location: [facility] ORS;  Service: ENT;  Laterality: N/A;    Family History  Problem Relation Age of Onset  . COPD Mother     Social History Social History   Tobacco Use  . Smoking status: Former Smoker    Packs/day: 3.00    Years: 20.00    Pack years: 60.00    Types: Cigarettes    Quit date: 04/11/2016    Years since quitting: 3.1  . Smokeless tobacco: Never Used  Substance Use Topics  . Alcohol use: No    Comment: former drinker-1 pint of gin/week-quit 2015  . Drug use: Yes    Types: Marijuana    Comment: one joint /day x 2 y    No Known Allergies  Current Facility-Administered Medications  Medication Dose Route Frequency Provider Last Rate Last Admin  . acetaminophen (TYLENOL) tablet 650 mg  650 mg Per Tube Q4H PRN Awilda Bill, NP   650 mg  at 06/16/19 1934  . chlorhexidine gluconate (MEDLINE KIT) (PERIDEX) 0.12 % solution 15 mL  15 mL Mouth Rinse BID Tyler Pita, MD   15 mL at 06/21/19 1945  . Chlorhexidine Gluconate Cloth 2 % PADS 6 each  6 each Topical Daily Tyler Pita, MD   6 each at 06/22/19 0100  . dextrose 10 % infusion   Intravenous Continuous Tyler Pita, MD 30 mL/hr at 06/22/19 1645 New Bag at 06/22/19 1645  . docusate (COLACE) 50 MG/5ML liquid 100 mg  100 mg Oral BID Awilda Bill, NP   100 mg at 06/21/19 2204  . famotidine (PEPCID) IVPB 20 mg premix  20 mg Intravenous Q12H Awilda Bill, NP   Stopped at 06/22/19 1111  . feeding supplement (PRO-STAT SUGAR FREE 64) liquid 30 mL  30 mL Per Tube BID Flora Lipps, MD   30 mL at 06/21/19 2203  . fentaNYL (SUBLIMAZE) injection 100 mcg  100 mcg Intravenous Once Flora Lipps, MD      . fentaNYL (SUBLIMAZE) injection 50-200 mcg  50-200 mcg Intravenous Q30 min PRN Awilda Bill, NP   100 mcg at 06/18/19 0138  . fentaNYL 2574mg in NS 2577m(1038mml) infusion-PREMIX  0-400 mcg/hr Intravenous Continuous GonTyler PitaD  20 mL/hr at 06/22/19 1645 200 mcg/hr at 06/22/19 1645  . ipratropium-albuterol (DUONEB) 0.5-2.5 (3) MG/3ML nebulizer solution 3 mL  3 mL Nebulization Q4H Awilda Bill, NP   3 mL at 06/22/19 1538  . ipratropium-albuterol (DUONEB) 0.5-2.5 (3) MG/3ML nebulizer solution 3 mL  3 mL Nebulization Q6H PRN Awilda Bill, NP      . MEDLINE mouth rinse  15 mL Mouth Rinse 10 times per day Tyler Pita, MD   15 mL at 06/22/19 1747  . methylPREDNISolone sodium succinate (SOLU-MEDROL) 40 mg/mL injection 20 mg  20 mg Intravenous Q12H Flora Lipps, MD   20 mg at 06/22/19 1627  . midazolam (VERSED) injection 1-2 mg  1-2 mg Intravenous Q1H PRN Awilda Bill, NP   2 mg at 06/22/19 0732  . morphine 2 MG/ML injection 2-4 mg  2-4 mg Intravenous Q1H PRN Flora Lipps, MD   2 mg at 06/22/19 1629  . multivitamin with minerals tablet 1 tablet  1  tablet Per Tube Daily Flora Lipps, MD   1 tablet at 06/21/19 0813  . polyethylene glycol (MIRALAX / GLYCOLAX) packet 17 g  17 g Per Tube BID Lin Landsman, MD   17 g at 06/21/19 2206  . propofol (DIPRIVAN) 1000 MG/100ML infusion  5-80 mcg/kg/min Intravenous Titrated Awilda Bill, NP 14.58 mL/hr at 06/22/19 1645 45 mcg/kg/min at 06/22/19 1645  . sodium chloride flush (NS) 0.9 % injection 10-40 mL  10-40 mL Intracatheter Q12H Tyler Pita, MD   10 mL at 06/22/19 1341  . sodium chloride flush (NS) 0.9 % injection 10-40 mL  10-40 mL Intracatheter PRN Tyler Pita, MD         Review of Systems Unable to obtain full ROS due to pt being intubated.  Physical Exam Blood pressure 119/83, pulse 96, temperature 98.9 F (37.2 C), temperature source Oral, resp. rate 18, height '5\' 6"'  (1.676 m), weight 58.4 kg, SpO2 95 %. CONSTITUTIONAL: NAD pt is not verbal, wearing mittens, he is malnourished EYES: Pupils are equal, round, and reactive to light, Sclera are non-icteric.  LYMPH NODES:  Lymph nodes in the neck are normal. RESPIRATORY:  Lung decrease BS bilaterally. . There is normal respiratory effort, with equal breath sounds bilaterally, and without pathologic use of accessory muscles. CARDIOVASCULAR: Heart is regular without murmurs, gallops, or rubs. GI: The abdomen is soft, nontender, and nondistended. There are no palpable masses. There is no hepatosplenomegaly. There are normal bowel sounds in all quadrants. GU: Rectal deferred.   MUSCULOSKELETAL: Normal muscle strength and tone. No cyanosis or edema.   SKIN: Turgor is good and there are no pathologic skin lesions or ulcers. NEUROLOGIC: pt sedated, unable to assess, no focal deficits  PSYCH: unable to assess , pt on vent  Data Reviewed  I have personally reviewed the patient's imaging, laboratory findings and medical records.    Assessment/Plan 49 yo male w malnutrition resp failure in need for enteral access. I have d/w  Dr. Marius Ditch from GI. They feel he is better suited for surgical gastrostomy given anatomy.  We will plan for robotic G tube on Thursday. We will d/w family to obtain consent. We will obtain coags in am and repeat CBC to check PL/   Caroleen Hamman, MD FACS General Surgeon 06/22/2019, 6:23 PM

## 2019-06-22 NOTE — Progress Notes (Signed)
Pt started to get agitated when back from surgery was trying to communicate with [place] and not able to. I was able to understand from pt that he wanted to know what was happening to him and when he could go home. I explained his circumstances and the procedure that he had done. He also communicated he was in pain and wanted the tv on. I increased his fentanyl from 150 to 175 as well as gave him a bolus. I turned on cartoons for him per his request and he is now resting comfortably.

## 2019-06-22 NOTE — Progress Notes (Signed)
CRITICAL CARE NOTE  49 year old former smoker with a history very severe COPD stage IV, who presented via EMS after being found down and unresponsive.  Apparently the patient's mother placed a call to EMS.  The patient has a longstanding history of COPD and noncompliance with his oxygen and CPAP.  History could not be obtained from the patient as there was no family available and the patient had been intubated and was mechanically ventilated in the ED.  Of note he had recently been admitted to [facility] 29 May 2019 through 03 June 2019.  He had been evaluated by palliative care during this admission and actually had a DNR order.   Patient also had a 2D echo  with pulmonary hypertension consistent with cor pulmonale.  Records review: Patient has been following with Dr. Ned Clines at [facility].  Last known PFTs 11 September 2017.  FEV1 0.52 L or 20% predicted, FVC 1.49 L or 45% of predicted, FEV1/FVC 35%.  FEF 25-75 6%.  DLCO was 33%.  This is consistent with very severe COPD.  Events: 4/13 admitted for severe resp failure from COPD 4/14 remains critically ill on vent 4/15 remains critically ill, severe COPD, failed SAT/SBT 4/16 extubated yesterday, had to be reintubated remains on ventilator 4/17 sedated on the ventilator 4/18 abdominal distention, CT abdomen and pelvis consistent with ileus and constipation 4/19-patietn remains on ventialtor awainting trach 4/20 - patient is on vent with trach, he had episodes of aggitation today but eventually improved    CC  Severe COPD Severe resp failure  SUBJ Extubated 4/11 Reintubated same day due to worsening respiratory failure  Apparently agreed to tracheostomy and PEG tube Currently sedated, cannot participate in discussion Will likely remain ventilator dependent for life    BP 119/83   Pulse 96   Temp 98.9 F (37.2 C) (Oral)   Resp 18   Ht 5\' 6"  (1.676 m)   Wt 58.4 kg   SpO2 95%   BMI 20.78 kg/m    I/O last 3 completed  shifts: In: 3658.1 [I.V.:2808.1; NG/GT:750; IV Piggyback:100] Out: 3255 [Urine:3255] Total I/O In: 734.6 [I.V.:584.6; IV Piggyback:150] Out: 675 [Urine:675]  SpO2: 95 % O2 Flow Rate (L/min): 28 L/min FiO2 (%): 28 %  Estimated body mass index is 20.78 kg/m as calculated from the following:   Height as of this encounter: 5\' 6"  (1.676 m).   Weight as of this encounter: 58.4 kg.  REVIEW OF SYSTEMS  10 point ROS done and is neg except as per subjective findings  PHYSICAL EXAMINATION: Physical Exam  Constitutional: No distress.  HENT:  Head: Normocephalic and atraumatic.    Cardiovascular: Regular rhythm.  Pulmonary/Chest: Breath sounds normal. No accessory muscle usage. Tachypnea noted. He is in respiratory distress.  Abdominal: Bowel sounds are normal. He exhibits no distension, no fluid wave and no ascites. There is no abdominal tenderness.  Neurological: He is alert.       MEDICATIONS: I have reviewed all medications and confirmed regimen as documented   CULTURE RESULTS   Recent Results (from the past 240 hour(s))  Culture, blood (routine x 2)     Status: Abnormal   Collection Time: 06/15/19  5:00 AM   Specimen: BLOOD  Result Value Ref Range Status   Specimen Description   Final    BLOOD RIGHT BICEP Performed at [facility], [address] [place], [address] [place]    Special Requests   Final    BOTTLES DRAWN AEROBIC AND ANAEROBIC Blood Culture  adequate volume Performed at [facility], [address] [place], [place] [address]    Culture  Setup Time   Final    GRAM POSITIVE COCCI AEROBIC BOTTLE ONLY CRITICAL RESULT CALLED TO, READ BACK BY AND VERIFIED WITH: CHRISTINE KATSOUDAS AT 0753 ON 06/16/2019 [facility].    Culture (A)  Final    STAPHYLOCOCCUS SPECIES (COAGULASE NEGATIVE) THE SIGNIFICANCE OF ISOLATING THIS ORGANISM FROM A SINGLE SET OF BLOOD CULTURES WHEN MULTIPLE SETS ARE DRAWN IS UNCERTAIN. PLEASE NOTIFY THE MICROBIOLOGY DEPARTMENT  WITHIN ONE WEEK IF SPECIATION AND SENSITIVITIES ARE REQUIRED. Performed at [facility], [address], [place], [place] [address]    Report Status 06/17/2019 FINAL  Final  Culture, blood (routine x 2)     Status: None   Collection Time: 06/15/19  5:00 AM   Specimen: BLOOD  Result Value Ref Range Status   Specimen Description BLOOD LEFT AC  Final   Special Requests   Final    BOTTLES DRAWN AEROBIC AND ANAEROBIC Blood Culture adequate volume   Culture   Final    NO GROWTH 5 DAYS Performed at [facility], [address] [place], [place] [address]    Report Status 06/20/2019 FINAL  Final  Respiratory Panel by RT PCR (Flu A&B, Covid) - Nasopharyngeal Swab     Status: None   Collection Time: 06/15/19  5:00 AM   Specimen: Nasopharyngeal Swab  Result Value Ref Range Status   SARS Coronavirus 2 by RT PCR NEGATIVE NEGATIVE Final    Comment: (NOTE) SARS-CoV-2 target nucleic acids are NOT DETECTED. The SARS-CoV-2 RNA is generally detectable in upper respiratoy specimens during the acute phase of infection. The lowest concentration of SARS-CoV-2 viral copies this assay can detect is 131 copies/mL. A negative result does not preclude SARS-Cov-2 infection and should not be used as the sole basis for treatment or other patient management decisions. A negative result may occur with  improper specimen collection/handling, submission of specimen other than nasopharyngeal swab, presence of viral mutation(s) within the areas targeted by this assay, and inadequate number of viral copies (<131 copies/mL). A negative result must be combined with clinical observations, patient history, and epidemiological information. The expected result is Negative. Fact Sheet for Patients:  https://www.moore.com/ Fact Sheet for Healthcare Providers:  https://www.young.biz/ This test is not yet ap proved or cleared by the [place] FDA and  has been  authorized for detection and/or diagnosis of SARS-CoV-2 by FDA under an Emergency Use Authorization (EUA). This EUA will remain  in effect (meaning this test can be used) for the duration of the COVID-19 declaration under Section 564(b)(1) of the Act, 21 U.S.C. section 360bbb-3(b)(1), unless the authorization is terminated or revoked sooner.    Influenza A by PCR NEGATIVE NEGATIVE Final   Influenza B by PCR NEGATIVE NEGATIVE Final    Comment: (NOTE) The Xpert Xpress SARS-CoV-2/FLU/RSV assay is intended as an aid in  the diagnosis of influenza from Nasopharyngeal swab specimens and  should not be used as a sole basis for treatment. Nasal washings and  aspirates are unacceptable for Xpert Xpress SARS-CoV-2/FLU/RSV  testing. Fact Sheet for Patients: https://www.moore.com/ Fact Sheet for Healthcare Providers: https://www.young.biz/ This test is not yet approved or cleared by the [place] FDA and  has been authorized for detection and/or diagnosis of SARS-CoV-2 by  FDA under an Emergency Use Authorization (EUA). This EUA will remain  in effect (meaning this test can be used) for the duration of the  Covid-19  declaration under Section 564(b)(1) of the Act, 21  U.S.C. section 360bbb-3(b)(1), unless the authorization is  terminated or revoked. Performed at [facility], [address], [place], [place] [address]   Urine Culture     Status: None   Collection Time: 06/15/19  5:00 AM   Specimen: Urine, Random  Result Value Ref Range Status   Specimen Description   Final    URINE, RANDOM Performed at [facility], [address], [place], [place] [address]    Special Requests   Final    NONE Performed at [facility], [address], [place], [place] [address]    Culture   Final    NO GROWTH Performed at [facility], [address] [place], [place] [address]    Report Status 06/16/2019 FINAL  Final   Blood Culture ID Panel (Reflexed)     Status: Abnormal   Collection Time: 06/15/19  5:00 AM  Result Value Ref Range Status   Enterococcus species NOT DETECTED NOT DETECTED Final   Listeria monocytogenes NOT DETECTED NOT DETECTED Final   Staphylococcus species DETECTED (A) NOT DETECTED Final    Comment: Methicillin (oxacillin) susceptible coagulase negative staphylococcus. Possible blood culture contaminant (unless isolated from more than one blood culture draw or clinical case suggests pathogenicity). No antibiotic treatment is indicated for blood  culture contaminants. CRITICAL RESULT CALLED TO, READ BACK BY AND VERIFIED WITH: CHRISTINE KATSOUDAS AT 0753 ON 06/16/2019 [facility].    Staphylococcus aureus (BCID) NOT DETECTED NOT DETECTED Final   Methicillin resistance NOT DETECTED NOT DETECTED Final   Streptococcus species NOT DETECTED NOT DETECTED Final   Streptococcus agalactiae NOT DETECTED NOT DETECTED Final   Streptococcus pneumoniae NOT DETECTED NOT DETECTED Final   Streptococcus pyogenes NOT DETECTED NOT DETECTED Final   Acinetobacter baumannii NOT DETECTED NOT DETECTED Final   Enterobacteriaceae species NOT DETECTED NOT DETECTED Final   Enterobacter cloacae complex NOT DETECTED NOT DETECTED Final   Escherichia coli NOT DETECTED NOT DETECTED Final   Klebsiella oxytoca NOT DETECTED NOT DETECTED Final   Klebsiella pneumoniae NOT DETECTED NOT DETECTED Final   Proteus species NOT DETECTED NOT DETECTED Final   Serratia marcescens NOT DETECTED NOT DETECTED Final   Haemophilus influenzae NOT DETECTED NOT DETECTED Final   Neisseria meningitidis NOT DETECTED NOT DETECTED Final   Pseudomonas aeruginosa NOT DETECTED NOT DETECTED Final   Candida albicans NOT DETECTED NOT DETECTED Final   Candida glabrata NOT DETECTED NOT DETECTED Final   Candida krusei NOT DETECTED NOT DETECTED Final   Candida parapsilosis NOT DETECTED NOT DETECTED Final   Candida tropicalis NOT DETECTED NOT DETECTED Final     Comment: Performed at [facility], [address] [place], [place] [address]  MRSA PCR Screening     Status: None   Collection Time: 06/15/19 11:36 AM   Specimen: Nasopharyngeal  Result Value Ref Range Status   MRSA by PCR NEGATIVE NEGATIVE Final    Comment:        The GeneXpert MRSA Assay (FDA approved for NASAL specimens only), is one component of a comprehensive MRSA colonization surveillance program. It is not intended to diagnose MRSA infection nor to guide or monitor treatment for MRSA infections. Performed at [facility], [address] [place], [place] [address]         BMP Latest Ref Rng & Units 06/22/2019 06/21/2019 06/20/2019  Glucose 70 - 99 mg/dL 95 89 993(T)  BUN 6 - 20 mg/dL 15 16 70(V)  Creatinine 0.61 - 1.24 mg/dL 0.53(L) 0.48(L) 0.48(L)  Sodium 135 - 145 mmol/L 141 141 136  Potassium 3.5 - 5.1 mmol/L 3.7 4.0 3.4(L)  Chloride 98 - 111 mmol/L 99 96(L) 91(L)  CO2 22 - 32 mmol/L 37(H) 40(H) 35(H)  Calcium 8.9 - 10.3 mg/dL 8.7(L) 8.6(L) 8.8(L)         Indwelling Urinary Catheter continued, requirement due to   Reason to continue Indwelling Urinary Catheter strict Intake/Output monitoring for hemodynamic instability         Ventilator continued, requirement due to severe respiratory failure   Ventilator Sedation RASS 0 to -2      ASSESSMENT AND PLAN SYNOPSIS  Acute on chronic hypercarbic/hypoxic respiratory failure COPD exacerbation No evidence of active infection Reassess goals of care his prognosis is very poor s/ptracheostomy  Bronchodilators: DuoNeb every 4 and as needed IV corticosteroids There continues to be no indication for antibiotics at present  Severe COPD stage IV with exacerbation Noted noncompliance with oxygen/BiPAP at home Bronchodilators as above IV corticosteroids Patient is DNR however, agreed to tracheostomy and PEG placement He will likely remain ventilator dependent for life Establish goals of  care with family (sister, daughter) and significant other today, no decision  Acute encephalopathy Unresponsiveness Due to hypercarbia Has resolved Requires sedation now for ventilator comfort, asynchrony CT head negative on admission  Modestly elevated troponin Due to demand ischemia Decompensation of cor pulmonale Trend troponins, no further elevation Supportive care  Cor pulmonale with decompensation Pulmonary hypertension Management of issues as above Poor prognostic sign  Hypoglycemia Due to poor nutritional status Continue tube feeds Add D10 ICU hypoglycemia protocol Poor prognostic sign  Acute kidney injury  Avoid nephrotoxins Stable with no worsening Trend renal panel  Hyperkalemia Likely triggered by acidosis RESOLVED  ID Procalcitonin normal No infiltrate on chest x-ray No indication for antibiotics at present Continue to trend WBC/fever curve Had 1 single blood culture of MRSA likely contaminant [name] MRSA PCR negative  Abdominal distention Ileus CT abdomen and pelvis: No obstruction: No free air Findings likely due to ileus, constipation Bowel regimen  Prophylaxis: Lovenox Famotidine    Critical care provider statement:    Critical care time (minutes):  35   Critical care time was exclusive of:  Separately billable procedures and  treating other patients   Critical care was necessary to treat or prevent imminent or  life-threatening deterioration of the following conditions:  Acute hypercapnic respiratory failure requiring tracheostomy   Critical care was time spent personally by me on the following  activities:  Development of treatment plan with patient or surrogate,  discussions with consultants, evaluation of patient's response to  treatment, examination of patient, obtaining history from patient or  surrogate, ordering and performing treatments and interventions, ordering  and review of laboratory studies and re-evaluation  of patient's condition   I assumed direction of critical care for this patient from another  provider in my specialty: no       Ottie Glazier, M.D.  Pulmonary & [place]

## 2019-06-22 NOTE — Transfer of Care (Signed)
Immediate Anesthesia Transfer of Care Note  Patient: Chris Case  Procedure(s) Performed: TRACHEOSTOMY (N/A )  Patient Location: PACU and ICU  Anesthesia Type:General  Level of Consciousness: sedated  Airway & Oxygen Therapy: Patient placed on Ventilator (see vital sign flow sheet for setting)  Post-op Assessment: Report given to RN and Post -op Vital signs reviewed and stable  Post vital signs: Reviewed and stable  Last Vitals:  Vitals Value Taken Time  BP    Temp    Pulse    Resp    SpO2      Last Pain:  Vitals:   06/21/19 2200  TempSrc: Bladder  PainSc:          Complications: No apparent anesthesia complications

## 2019-06-22 NOTE — Op Note (Signed)
..  06/22/2019 8:19 AM  Trula Ore [id number]  Pre-Op Dx: COPD, respiratory failure  Post-Op Dx:  same  Proc:  Tracheostomy  Surg:  Orli Degrave  Anes:  GOT  EBL:  <48ml  Comp:  none  Findings:  Size 8 cuffed shiley placed between tracheal ring 2 and 3  Procedure:  The patient was brought from the intensive care unit to the operating room and transferred to an operating table.  Anesthesia was administered per indwelling orotracheal tube.   Neck extension was achieved as possible anda shoulder rolke was placed.  The lower neck was palpated with the findings as described above.  1% Xylocaine with 1:100,000 epinephrine, 6 cc's, was infiltrated into the surgical field for intraoperative hemostasis.  Several minutes were allowed for this to take effect. The patient was prepped in a sterile fashion with a surgical prep from the chin down to the upper chest.  Sterile draping was accomplished in the standard fashion.  A  5 cm horizontal incision was made sharply a finger's breadth above the sternal notch, and extended through skin and subcutaneous fat.  Using cautery, the superficial layer of the deep cervical fascia was lysed.  Additional dissection revealed the strap muscles.  The midline raphe was divided in two layers and the muscles retracted laterally.  The pretracheal plane was visualized.  This was entered bluntly.  The thyroid isthmus was isolated and divided with the Harmonic scalpel.  The thyroid gland was retracted to either side.  The anterior face of the trachea was cleared.  In the  2nd-3rd interspace, a transverse incision was made between cartilage rings into the tracheal lumen.  A 6 mm wide inferiorly based flap was generated.   A previously tested  # 8 Shiley cuffed tracheostomy tube was brought into the field.  With the endotracheal tube under direct visualization through the tracheostomy, it was gently backed up.  The tracheostomy tube was inserted into the tracheal lumen.   Hemostasis was observed. The cuff was inflated and observed to be intact and containing pressure. The inner cannula was placed and ventilation assumed per tracheostomy tube.  Good chest wall motion was observed, and CO2 was documented per anesthesia.  The trach tube was secured in the standard fashion with trach ties. A 2-0 Nylon suture was used to secure the trach tube to the skin on both sides.  Hemostasis was observed again.  When satisfactory ventilation was assured, the orotracheal tube was removed.  At this point the procedure was completed.  The patient was returned to anesthesia, awakened as possible, and transferred back to the intensive care unit in stable condition.  Comment: 49 y.o. male with prolonged ventilation was the indication for today's procedure.  Anticipate a routine postoperative recovery including standard tracheal hygiene.  The sutures should be removed in 5 days.  When the patient no longer requires ventilator or pressure support, the cuff should be deflated.  Changing to an uncuffed tube and downsizing will be according to the clinical condition of the patient.   Roney Mans Dmitriy Gair  8:19 AM 06/22/2019

## 2019-06-23 ENCOUNTER — Inpatient Hospital Stay: Payer: Medicaid Other

## 2019-06-23 ENCOUNTER — Encounter: Payer: Self-pay | Admitting: Pulmonary Disease

## 2019-06-23 LAB — CBC WITH DIFFERENTIAL/PLATELET
Abs Immature Granulocytes: 0.1 10*3/uL — ABNORMAL HIGH (ref 0.00–0.07)
Basophils Absolute: 0 10*3/uL (ref 0.0–0.1)
Basophils Relative: 0 %
Eosinophils Absolute: 0.1 10*3/uL (ref 0.0–0.5)
Eosinophils Relative: 1 %
HCT: 31 % — ABNORMAL LOW (ref 39.0–52.0)
Hemoglobin: 9.5 g/dL — ABNORMAL LOW (ref 13.0–17.0)
Immature Granulocytes: 1 %
Lymphocytes Relative: 13 %
Lymphs Abs: 1.2 10*3/uL (ref 0.7–4.0)
MCH: 30.7 pg (ref 26.0–34.0)
MCHC: 30.6 g/dL (ref 30.0–36.0)
MCV: 100.3 fL — ABNORMAL HIGH (ref 80.0–100.0)
Monocytes Absolute: 1.2 10*3/uL — ABNORMAL HIGH (ref 0.1–1.0)
Monocytes Relative: 14 %
Neutro Abs: 6.2 10*3/uL (ref 1.7–7.7)
Neutrophils Relative %: 71 %
Platelets: 123 10*3/uL — ABNORMAL LOW (ref 150–400)
RBC: 3.09 MIL/uL — ABNORMAL LOW (ref 4.22–5.81)
RDW: 13.4 % (ref 11.5–15.5)
WBC: 8.8 10*3/uL (ref 4.0–10.5)
nRBC: 0 % (ref 0.0–0.2)

## 2019-06-23 LAB — BASIC METABOLIC PANEL
Anion gap: 6 (ref 5–15)
BUN: 11 mg/dL (ref 6–20)
CO2: 35 mmol/L — ABNORMAL HIGH (ref 22–32)
Calcium: 8.5 mg/dL — ABNORMAL LOW (ref 8.9–10.3)
Chloride: 97 mmol/L — ABNORMAL LOW (ref 98–111)
Creatinine, Ser: 0.52 mg/dL — ABNORMAL LOW (ref 0.61–1.24)
GFR calc Af Amer: >60 mL/min (ref >60)
GFR calc non Af Amer: >60 mL/min (ref >60)
Glucose, Bld: 217 mg/dL — ABNORMAL HIGH (ref 70–99)
Potassium: 3.3 mmol/L — ABNORMAL LOW (ref 3.5–5.1)
Sodium: 138 mmol/L (ref 135–145)

## 2019-06-23 LAB — GLUCOSE, CAPILLARY
Glucose-Capillary: 103 mg/dL — ABNORMAL HIGH (ref 70–99)
Glucose-Capillary: 56 mg/dL — ABNORMAL LOW (ref 70–99)
Glucose-Capillary: 59 mg/dL — ABNORMAL LOW (ref 70–99)
Glucose-Capillary: 73 mg/dL (ref 70–99)
Glucose-Capillary: 85 mg/dL (ref 70–99)
Glucose-Capillary: 96 mg/dL (ref 70–99)
Glucose-Capillary: 97 mg/dL (ref 70–99)
Glucose-Capillary: 99 mg/dL (ref 70–99)

## 2019-06-23 LAB — PHOSPHORUS: Phosphorus: 4.3 mg/dL (ref 2.5–4.6)

## 2019-06-23 LAB — MAGNESIUM: Magnesium: 1.8 mg/dL (ref 1.7–2.4)

## 2019-06-23 LAB — PROTIME-INR
INR: 1 (ref 0.8–1.2)
Prothrombin Time: 13.4 seconds (ref 11.4–15.2)

## 2019-06-23 LAB — APTT: aPTT: 29 seconds (ref 24–36)

## 2019-06-23 MED ORDER — SODIUM CHLORIDE 0.9 % IV SOLN
INTRAVENOUS | Status: DC | PRN
  Administered 2019-06-23: 250 mL via INTRAVENOUS

## 2019-06-23 MED ORDER — DEXTROSE 50 % IV SOLN
INTRAVENOUS | Status: AC
  Filled 2019-06-23: qty 50

## 2019-06-23 MED ORDER — DEXTROSE 50 % IV SOLN
1.0000 | Freq: Once | INTRAVENOUS | Status: AC
  Administered 2019-06-23: 50 mL via INTRAVENOUS

## 2019-06-23 MED ORDER — MAGNESIUM SULFATE 2 GM/50ML IV SOLN
2.0000 g | Freq: Once | INTRAVENOUS | Status: AC
  Administered 2019-06-23: 2 g via INTRAVENOUS
  Filled 2019-06-23: qty 50

## 2019-06-23 MED ORDER — DEXTROSE 50 % IV SOLN
1.0000 | INTRAVENOUS | Status: AC
  Administered 2019-06-23: 50 mL via INTRAVENOUS
  Filled 2019-06-23: qty 50

## 2019-06-23 MED ORDER — POTASSIUM CHLORIDE 10 MEQ/50ML IV SOLN
10.0000 meq | INTRAVENOUS | Status: AC
  Administered 2019-06-23 (×2): 10 meq via INTRAVENOUS
  Filled 2019-06-23 (×2): qty 50

## 2019-06-23 MED ORDER — SODIUM CHLORIDE 0.9 % IV SOLN
1.5000 g | Freq: Four times a day (QID) | INTRAVENOUS | Status: AC
  Administered 2019-06-23 – 2019-06-29 (×23): 1.5 g via INTRAVENOUS
  Filled 2019-06-23 (×4): qty 1.5
  Filled 2019-06-23: qty 4
  Filled 2019-06-23 (×4): qty 1.5
  Filled 2019-06-23 (×2): qty 4
  Filled 2019-06-23 (×6): qty 1.5
  Filled 2019-06-23: qty 4
  Filled 2019-06-23 (×2): qty 1.5
  Filled 2019-06-23: qty 4
  Filled 2019-06-23 (×5): qty 1.5

## 2019-06-23 NOTE — Plan of Care (Signed)
  Problem: Education: Goal: Knowledge of General Education information will improve Description: Including pain rating scale, medication(s)/side effects and non-pharmacologic comfort measures Outcome: Progressing   Problem: Health Behavior/Discharge Planning: Goal: Ability to manage health-related needs will improve Outcome: Progressing   Problem: Clinical Measurements: Goal: Ability to maintain clinical measurements within normal limits will improve Outcome: Progressing Goal: Will remain free from infection Outcome: Progressing Goal: Diagnostic test results will improve Outcome: Progressing Goal: Respiratory complications will improve Outcome: Progressing Goal: Cardiovascular complication will be avoided Outcome: Progressing   Problem: Nutrition: Goal: Adequate nutrition will be maintained Outcome: Progressing   Problem: Elimination: Goal: Will not experience complications related to bowel motility Outcome: Progressing Goal: Will not experience complications related to urinary retention Outcome: Progressing   Problem: Pain Managment: Goal: General experience of comfort will improve Outcome: Progressing   Problem: Safety: Goal: Ability to remain free from injury will improve Outcome: Progressing   Problem: Skin Integrity: Goal: Risk for impaired skin integrity will decrease Outcome: Progressing   Problem: Activity: Goal: Ability to tolerate increased activity will improve Outcome: Progressing   Problem: Respiratory: Goal: Ability to maintain a clear airway and adequate ventilation will improve Outcome: Progressing   Problem: Role Relationship: Goal: Method of communication will improve Outcome: Progressing   Problem: Education: Goal: Knowledge of disease or condition will improve Outcome: Progressing Goal: Knowledge of the prescribed therapeutic regimen will improve Outcome: Progressing Goal: Individualized Educational Video(s) Outcome: Progressing    Problem: Activity: Goal: Ability to tolerate increased activity will improve Outcome: Progressing Goal: Will verbalize the importance of balancing activity with adequate rest periods Outcome: Progressing   Problem: Respiratory: Goal: Ability to maintain a clear airway will improve Outcome: Progressing Goal: Levels of oxygenation will improve Outcome: Progressing Goal: Ability to maintain adequate ventilation will improve Outcome: Progressing   

## 2019-06-23 NOTE — Progress Notes (Signed)
Nutrition Follow Up Note   DOCUMENTATION CODES:   Severe malnutrition in context of chronic illness  INTERVENTION:   Once G-tube in place and ready for use, recommend:  Vital AF 1.2 _0 /hr + Prostat 23m BID via tube  Propofol: 12.96 ml/hr- provides 342kcal/day   Free water flushes 360mq4 hours to maintain tube patency   Regimen provides 1694kcal/day, 102g/day protein and 95878may free water   Provide Liquid MVI daily via tube   NUTRITION DIAGNOSIS:   Severe Malnutrition related to chronic illness(COPD) as evidenced by severe fat depletion, severe muscle depletion.  GOAL:   Provide needs based on ASPEN/SCCM guidelines  Previously met with tube feeds- now awaiting G-tube  MONITOR:   Vent status, Labs, Weight trends, TF tolerance, I & O's  ASSESSMENT:   [age]ar old male with PMHx of emphysema, COPD, hx TB admitted with acute COPD exacerbation requiring intubation.   Pt s/p tracheostomy 4/20. Plan is for surgical G-tube placement 4/22. Pt currently off tube feeds. OGT removed yesterday. On 4/18 pt developed abdominal distention; CT abdomen and pelvis consistent with ileus and constipation and tube feeds decreased to a trickle. Per chart, pt up ~11lbs since admit. Pt +5L on I & Os.   Medications reviewed and include: colace, fentanyl, solu-medrol, MVI, miralax, 10% dextrose _1 /hr, pepcid, KCl, propofol  Labs reviewed: K 3.3(L), creat 0.52(L), P 4.3 wnl, Mg 1.8 wnl Hgb 9.5(L), Hct 31.0(L)  Patient is currently intubated on ventilator support MV: 8.4 L/min Temp (24hrs), Avg:99.2 F (37.3 C), Min:98.1 F (36.7 C), Max:100.2 F (37.9 C)  Propofol: 12.96 ml/hr- provides 342kcal/day   MAP- >61m77m UOP- 1725ml80mput   Diet Order:   Diet Order            Diet NPO time specified  Diet effective midnight        Diet NPO time specified  Diet effective now             EDUCATION NEEDS:   No education needs have been identified at this time  Skin:  Skin  Assessment: Reviewed RN Assessment(incision neck)  Last BM:  4/19- type 6  Height:   Ht Readings from Last 1 Encounters:  06/15/19 5' 6" (1.676 m)   Weight:   Wt Readings from Last 1 Encounters:  06/23/19 59.9 kg   Ideal Body Weight:  64.5 kg  BMI:  Body mass index is 21.31 kg/m.  Estimated Nutritional Needs:   Kcal:  1686 0630 2003b w/MSJ 1358,16018.4, Tmax 37.9)  Protein:  90-100g/day  Fluid:  1.6-1.9 L/day  CaseyKoleen DistanceRD, LDN Please refer to [facility]ION[facility] CenterRD and/or RD on-call/weekend/after hours pager

## 2019-06-23 NOTE — Progress Notes (Signed)
CRITICAL CARE NOTE  49 year old former smoker with a history very severe COPD stage IV, who presented via EMS after being found down and unresponsive.  Apparently the patient's mother placed a call to EMS.  The patient has a longstanding history of COPD and noncompliance with his oxygen and CPAP.  History could not be obtained from the patient as there was no family available and the patient had been intubated and was mechanically ventilated in the ED.  Of note he had recently been admitted to [facility] 29 May 2019 through 03 June 2019.  He had been evaluated by palliative care during this admission and actually had a DNR order.   Patient also had a 2D echo  with pulmonary hypertension consistent with cor pulmonale.  Records review: Patient has been following with Dr. Ned Clines at [facility].  Last known PFTs 11 September 2017.  FEV1 0.52 L or 20% predicted, FVC 1.49 L or 45% of predicted, FEV1/FVC 35%.  FEF 25-75 6%.  DLCO was 33%.  This is consistent with very severe COPD.  Events: 4/13 admitted for severe resp failure from COPD 4/14 remains critically ill on vent 4/15 remains critically ill, severe COPD, failed SAT/SBT 4/16 extubated yesterday, had to be reintubated remains on ventilator 4/17 sedated on the ventilator 4/18 abdominal distention, CT abdomen and pelvis consistent with ileus and constipation 4/19-patietn remains on ventialtor awainting trach 4/20 - patient is on vent with trach, he had episodes of aggitation today but eventually improved  4/21 - patient with significant distension of abdomen, concern for aerophagia in NIV.  There is mucopurulent exudation from trache this was cultures and patient placed on Unasyn for tracheitis   CC  Severe COPD Severe resp failure  SUBJ Extubated 4/11 Reintubated same day due to worsening respiratory failure  Apparently agreed to tracheostomy and PEG tube Currently sedated, cannot participate in discussion Will likely remain ventilator  dependent for life    BP (!) 129/112 (BP Location: Left Arm)   Pulse (!) 116   Temp 99.4 F (37.4 C) (Axillary)   Resp 19   Ht 5\' 6"  (1.676 m)   Wt 59.9 kg   SpO2 95%   BMI 21.31 kg/m    I/O last 3 completed shifts: In: 2954.1 [I.V.:2639.8; NG/GT:100; IV Piggyback:214.4] Out: 2625 [Urine:2625] Total I/O In: 765.9 [I.V.:580.2; IV Piggyback:185.7] Out: 550 [Urine:550]  SpO2: 95 % O2 Flow Rate (L/min): 28 L/min FiO2 (%): 30 %  Estimated body mass index is 21.31 kg/m as calculated from the following:   Height as of this encounter: 5\' 6"  (1.676 m).   Weight as of this encounter: 59.9 kg.  REVIEW OF SYSTEMS  10 point ROS done and is neg except as per subjective findings  PHYSICAL EXAMINATION: Physical Exam  Constitutional: No distress.  HENT:  Head: Normocephalic and atraumatic.    Cardiovascular: Regular rhythm.  Pulmonary/Chest: Breath sounds normal. No accessory muscle usage. Tachypnea noted. He is in respiratory distress.  Abdominal: Bowel sounds are normal. He exhibits no distension, no fluid wave and no ascites. There is no abdominal tenderness.  Neurological: He is alert.       MEDICATIONS: I have reviewed all medications and confirmed regimen as documented   CULTURE RESULTS   Recent Results (from the past 240 hour(s))  Culture, blood (routine x 2)     Status: Abnormal   Collection Time: 06/15/19  5:00 AM   Specimen: BLOOD  Result Value Ref Range Status   Specimen Description   Final  BLOOD RIGHT BICEP Performed at [facility], [address] [place], [place] [address]    Special Requests   Final    BOTTLES DRAWN AEROBIC AND ANAEROBIC Blood Culture adequate volume Performed at [facility], [address] [place], [place] [address]    Culture  Setup Time   Final    GRAM POSITIVE COCCI AEROBIC BOTTLE ONLY CRITICAL RESULT CALLED TO, READ BACK BY AND VERIFIED WITH: CHRISTINE KATSOUDAS AT 2440 ON 06/16/2019 [place].     Culture (A)  Final    STAPHYLOCOCCUS SPECIES (COAGULASE NEGATIVE) THE SIGNIFICANCE OF ISOLATING THIS ORGANISM FROM A SINGLE SET OF BLOOD CULTURES WHEN MULTIPLE SETS ARE DRAWN IS UNCERTAIN. PLEASE NOTIFY THE MICROBIOLOGY DEPARTMENT WITHIN ONE WEEK IF SPECIATION AND SENSITIVITIES ARE REQUIRED. Performed at [facility], [place] [address] [place], [place] [address]    Report Status 06/17/2019 FINAL  Final  Culture, blood (routine x 2)     Status: None   Collection Time: 06/15/19  5:00 AM   Specimen: BLOOD  Result Value Ref Range Status   Specimen Description BLOOD LEFT AC  Final   Special Requests   Final    BOTTLES DRAWN AEROBIC AND ANAEROBIC Blood Culture adequate volume   Culture   Final    NO GROWTH 5 DAYS Performed at [facility], [address] [place], [place] [address]    Report Status 06/20/2019 FINAL  Final  Respiratory Panel by RT PCR (Flu A&B, Covid) - Nasopharyngeal Swab     Status: None   Collection Time: 06/15/19  5:00 AM   Specimen: Nasopharyngeal Swab  Result Value Ref Range Status   SARS Coronavirus 2 by RT PCR NEGATIVE NEGATIVE Final    Comment: (NOTE) SARS-CoV-2 target nucleic acids are NOT DETECTED. The SARS-CoV-2 RNA is generally detectable in upper respiratoy specimens during the acute phase of infection. The lowest concentration of SARS-CoV-2 viral copies this assay can detect is 131 copies/mL. A negative result does not preclude SARS-Cov-2 infection and should not be used as the sole basis for treatment or other patient management decisions. A negative result may occur with  improper specimen collection/handling, submission of specimen other than nasopharyngeal swab, presence of viral mutation(s) within the areas targeted by this assay, and inadequate number of viral copies (<131 copies/mL). A negative result must be combined with clinical observations, patient history, and epidemiological information. The expected result is Negative. Fact  Sheet for Patients:  PinkCheek.be Fact Sheet for Healthcare Providers:  GravelBags.it This test is not yet ap proved or cleared by the [place] FDA and  has been authorized for detection and/or diagnosis of SARS-CoV-2 by FDA under an Emergency Use Authorization (EUA). This EUA will remain  in effect (meaning this test can be used) for the duration of the COVID-19 declaration under Section 564(b)(1) of the Act, 21 U.S.C. section 360bbb-3(b)(1), unless the authorization is terminated or revoked sooner.    Influenza A by PCR NEGATIVE NEGATIVE Final   Influenza B by PCR NEGATIVE NEGATIVE Final    Comment: (NOTE) The Xpert Xpress SARS-CoV-2/FLU/RSV assay is intended as an aid in  the diagnosis of influenza from Nasopharyngeal swab specimens and  should not be used as a sole basis for treatment. Nasal washings and  aspirates are unacceptable for Xpert Xpress SARS-CoV-2/FLU/RSV  testing. Fact Sheet for Patients: PinkCheek.be Fact Sheet for Healthcare Providers: GravelBags.it This test is not yet approved or cleared by the [place] FDA and  has been authorized for detection  and/or diagnosis of SARS-CoV-2 by  FDA under an Emergency Use Authorization (EUA). This EUA will remain  in effect (meaning this test can be used) for the duration of the  Covid-19 declaration under Section 564(b)(1) of the Act, 21  U.S.C. section 360bbb-3(b)(1), unless the authorization is  terminated or revoked. Performed at [facility], [address] [place], [place] [address]   Urine Culture     Status: None   Collection Time: 06/15/19  5:00 AM   Specimen: Urine, Random  Result Value Ref Range Status   Specimen Description   Final    URINE, RANDOM Performed at [facility], [address] [place], [place] [address]    Special Requests   Final    NONE Performed  at [facility], [address], [place], [place] [address]    Culture   Final    NO GROWTH Performed at [facility], [address], [place], [place] [address]    Report Status 06/16/2019 FINAL  Final  Blood Culture ID Panel (Reflexed)     Status: Abnormal   Collection Time: 06/15/19  5:00 AM  Result Value Ref Range Status   Enterococcus species NOT DETECTED NOT DETECTED Final   Listeria monocytogenes NOT DETECTED NOT DETECTED Final   Staphylococcus species DETECTED (A) NOT DETECTED Final    Comment: Methicillin (oxacillin) susceptible coagulase negative staphylococcus. Possible blood culture contaminant (unless isolated from more than one blood culture draw or clinical case suggests pathogenicity). No antibiotic treatment is indicated for blood  culture contaminants. CRITICAL RESULT CALLED TO, READ BACK BY AND VERIFIED WITH: CHRISTINE KATSOUDAS AT 0753 ON 06/16/2019 [facility].    Staphylococcus aureus (BCID) NOT DETECTED NOT DETECTED Final   Methicillin resistance NOT DETECTED NOT DETECTED Final   Streptococcus species NOT DETECTED NOT DETECTED Final   Streptococcus agalactiae NOT DETECTED NOT DETECTED Final   Streptococcus pneumoniae NOT DETECTED NOT DETECTED Final   Streptococcus pyogenes NOT DETECTED NOT DETECTED Final   Acinetobacter baumannii NOT DETECTED NOT DETECTED Final   Enterobacteriaceae species NOT DETECTED NOT DETECTED Final   Enterobacter cloacae complex NOT DETECTED NOT DETECTED Final   Escherichia coli NOT DETECTED NOT DETECTED Final   Klebsiella oxytoca NOT DETECTED NOT DETECTED Final   Klebsiella pneumoniae NOT DETECTED NOT DETECTED Final   Proteus species NOT DETECTED NOT DETECTED Final   Serratia marcescens NOT DETECTED NOT DETECTED Final   Haemophilus influenzae NOT DETECTED NOT DETECTED Final   Neisseria meningitidis NOT DETECTED NOT DETECTED Final   Pseudomonas aeruginosa NOT DETECTED NOT DETECTED Final   Candida albicans NOT DETECTED NOT  DETECTED Final   Candida glabrata NOT DETECTED NOT DETECTED Final   Candida krusei NOT DETECTED NOT DETECTED Final   Candida parapsilosis NOT DETECTED NOT DETECTED Final   Candida tropicalis NOT DETECTED NOT DETECTED Final    Comment: Performed at [facility], [address] [place], [place] [address]  MRSA PCR Screening     Status: None   Collection Time: 06/15/19 11:36 AM   Specimen: Nasopharyngeal  Result Value Ref Range Status   MRSA by PCR NEGATIVE NEGATIVE Final    Comment:        The GeneXpert MRSA Assay (FDA approved for NASAL specimens only), is one component of a comprehensive MRSA colonization surveillance program. It is not intended to diagnose MRSA infection nor to guide or monitor treatment for MRSA infections. Performed at [facility], [address], [place], [place] [address]  BMP Latest Ref Rng & Units 06/23/2019 06/22/2019 06/21/2019  Glucose 70 - 99 mg/dL 384(Y) 95 89  BUN 6 - 20 mg/dL 11 15 16   Creatinine 0.61 - 1.24 mg/dL ) 6.59(D) 3.57(S)  Sodium 135 - 145 mmol/L 138 141 141  Potassium 3.5 - 5.1 mmol/L 3.3(L) 3.7 4.0  Chloride 98 - 111 mmol/L 97(L) 99 96(L)  CO2 22 - 32 mmol/L 35(H) 37(H) 40(H)  Calcium 8.9 - 10.3 mg/dL 1.77(L) 3.9(Q) 3.0(S)         Indwelling Urinary Catheter continued, requirement due to   Reason to continue Indwelling Urinary Catheter strict Intake/Output monitoring for hemodynamic instability         Ventilator continued, requirement due to severe respiratory failure   Ventilator Sedation RASS 0 to -2      ASSESSMENT AND PLAN SYNOPSIS  Acute on chronic hypercarbic/hypoxic respiratory failure COPD exacerbation s/ptracheostomy - there is mucopurulent exudation concerning for tracheitis - will culture and treat empirically with Unasyn Bronchodilators: DuoNeb every 4 and as needed IV corticosteroids There continues to be no indication for antibiotics at present  Severe COPD stage IV   Noted noncompliance with oxygen/BiPAP at home -continue LAMA/LABA/ICS   Acute encephalopathy Unresponsiveness Due to hypercarbia Has resolved   Hypoglycemia Due to poor nutritional status Continue tube feeds Add D10 ICU hypoglycemia protocol Poor prognostic sign  Acute kidney injury  Avoid nephrotoxins Stable with no worsening Trend renal panel  Hyperkalemia Likely triggered by acidosis RESOLVED  ID Procalcitonin normal No infiltrate on chest x-ray No indication for antibiotics at present Continue to trend WBC/fever curve Had 1 single blood culture of MRSA likely contaminant [name] MRSA PCR negative  Abdominal distention Ileus CT abdomen and pelvis: No obstruction: No free air Findings likely due to ileus, constipation Bowel regimen  Prophylaxis: Lovenox Famotidine    Critical care provider statement:    Critical care time (minutes):  35   Critical care time was exclusive of:  Separately billable procedures and  treating other patients   Critical care was necessary to treat or prevent imminent or  life-threatening deterioration of the following conditions:  Acute hypercapnic respiratory failure requiring tracheostomy   Critical care was time spent personally by me on the following  activities:  Development of treatment plan with patient or surrogate,  discussions with consultants, evaluation of patient's response to  treatment, examination of patient, obtaining history from patient or  surrogate, ordering and performing treatments and interventions, ordering  and review of laboratory studies and re-evaluation of patient's condition   I assumed direction of critical care for this patient from another  provider in my specialty: no       9.2(Z, M.D.  Pulmonary & Critical Care Medicine  [facility]

## 2019-06-23 NOTE — Progress Notes (Signed)
Pt seen and examined. Abd xray pers. reviewed, no free air , no obstruction, air in the colon. Likely ileus from sedation/narcotics. No contraindication for G tube. D/W Dr. Allegra Lai and Dr. Karna Christmas in detail we are all in agreement about surgical g tube. D/W Daughter and Sister in detail over the phone individually. Plan for robotic G tube in am. Procedure d/w the pt in detail. Risks, benefits and possible complications including but not limited to Bleeding, infection, bowel injuries. They understand and wish to proceed. Consent obtained. I spent > 35 minutes today w > 50% spent in coordination and counseling of his care.

## 2019-06-23 NOTE — Progress Notes (Signed)
PHARMACY CONSULT NOTE  Pharmacy Consult for Electrolyte Monitoring and Replacement   Recent Labs: Potassium (mmol/L)  Date Value  06/23/2019 3.3 (L)  09/24/2013 4.6   Magnesium (mg/dL)  Date Value  54/98/2641 1.8   Calcium (mg/dL)  Date Value  58/30/9407 8.5 (L)   Calcium, Total (mg/dL)  Date Value  68/10/8108 8.9   Albumin (g/dL)  Date Value  31/59/4585 3.5   Phosphorus (mg/dL)  Date Value  92/92/4462 4.3   Sodium (mmol/L)  Date Value  06/23/2019 138  09/24/2013 138     Assessment: 49 year old male admitted after being found unresponsive. Patient was intubated in the ED and transferred to the ICU. Patient started on tube feeds and at refeeding risk.  Patient extubated 4/15 am and placed on BiPAP. Patient required reintubation.   Trach 4/20. Plan for G tube placement 4/22.  Goal of Therapy:  Electrolytes WNL  Plan:  Potassium replaced this morning. Labs have been ordered daily. Will follow and replace as indicated.  Pricilla Riffle, PharmD Clinical Pharmacist 06/23/2019 1:45 PM

## 2019-06-23 NOTE — Progress Notes (Signed)
CH visited pt. as follow-up from visit last week; pt. sitting up in bed; now has trach --> attempts to talk but difficult to understand due to trach.  Pt. animated and communicative --> CH attempted to give pt. paper and pen to help him express what he seemed eager to share, but ultimately pt. communicated that he wanted prayer, just like CH had prayed for him during his prior admission last month.  CH prayed for pt. and will continue to monitor needs.  9:40pm - CH conducting evening ICU rounds when pt. waved CH into rm.  Pt. requests follow up visit in AM for prayer; CH said he will try to come and will at very least make referral to incoming chaplains for visit.    06/23/19 1730  Clinical Encounter Type  Visited With Patient  Visit Type Psychological support;Follow-up;Social support;Critical Care  Referral From Patient  Spiritual Encounters  Spiritual Needs Emotional;Prayer  Stress Factors  Patient Stress Factors Loss of control;Health changes

## 2019-06-23 NOTE — Anesthesia Postprocedure Evaluation (Signed)
Anesthesia Post Note  Patient: Chris Case  Procedure(s) Performed: TRACHEOSTOMY (N/A )  Patient location during evaluation: ICU Anesthesia Type: General Level of consciousness: awake, awake and alert, oriented and patient cooperative Pain management: pain level controlled Vital Signs Assessment: post-procedure vital signs reviewed and stable Respiratory status: respiratory function stable, nonlabored ventilation, spontaneous breathing and patient on ventilator - see flowsheet for VS Cardiovascular status: blood pressure returned to baseline and stable Anesthetic complications: no     Last Vitals:  Vitals:   06/23/19 0700 06/23/19 0801  BP: 110/74   Pulse: 77 (!) 102  Resp: 18 15  Temp: 37.4 C   SpO2: 94% 90%    Last Pain:  Vitals:   06/23/19 0600  TempSrc: Bladder  PainSc:                  Ginger Carne

## 2019-06-23 NOTE — Progress Notes (Addendum)
Shift summary:  0800 hrs:  - Patient remains on ventilator via Trach. - Failed SBT this AM. - Foley catheter removed this AM.  1200 hrs:   - Patient is now off of Propofol and Fentanyl infusions as he is able to remain synchronous with the ventilator. - Continued copious secretions from trach site. - Plan for PEG tomorrow. Consent obtained from the patient's daughter, Chris Case.  1600 hrs:   - Continued copious secretions from trach site. Culture obtained. - Abdomen remains distended and firm. NGT placed.

## 2019-06-23 NOTE — Anesthesia Preprocedure Evaluation (Addendum)
Anesthesia Evaluation  Patient identified by MRN, date of birth, ID band Patient awake and Patient unresponsive    Reviewed: Allergy & Precautions, NPO status , [name] Chart, lab work & pertinent test results, reviewed documented beta blocker date and time   History of Anesthesia Complications Negative for: history of anesthetic complications  Airway Mallampati: Intubated       Dental   Pulmonary COPD,  COPD inhaler and oxygen dependent, former smoker,  On vent with trach          Cardiovascular   PHTN, Cor polmonale   Neuro/Psych Pt sedated on vent    GI/Hepatic   Endo/Other    Renal/GU      Musculoskeletal   Abdominal   Peds  Hematology   Anesthesia Other Findings   Reproductive/Obstetrics                             Anesthesia Physical  Anesthesia Plan  ASA: IV  Anesthesia Plan: General   Post-op Pain Management:    Induction: Intravenous  PONV Risk Score and Plan: Treatment may vary due to age or medical condition  Airway Management Planned: Tracheostomy  Additional Equipment:   Intra-op Plan:   Post-operative Plan: Post-operative intubation/ventilation  Informed Consent: I have reviewed the patients History and Physical, chart, labs and discussed the procedure including the risks, benefits and alternatives for the proposed anesthesia with the patient or authorized representative who has indicated his/her understanding and acceptance.    Discussed DNR with power of attorney and Suspend DNR.   Dental Advisory Given  Plan Discussed with: CRNA and Surgeon  Anesthesia Plan Comments: (Talked with patient's daughter, [name], [place] and obtained consent.    Daughter consented for risks of anesthesia including but not limited to:  - adverse reactions to medications - damage to eyes, teeth, lips or other oral mucosa - nerve damage due to positioning  - sore throat  or hoarseness - Damage to heart, brain, nerves, lungs or loss of life  Daughter voiced understanding.)     Anesthesia Quick Evaluation

## 2019-06-24 ENCOUNTER — Inpatient Hospital Stay: Payer: Medicaid Other | Admitting: Anesthesiology

## 2019-06-24 ENCOUNTER — Encounter
Admission: EM | Disposition: A | Discharge status: Skilled Nursing Facility | Payer: Self-pay | Source: Home / Self Care | Attending: Internal Medicine

## 2019-06-24 HISTORY — PX: GASTROSTOMY: SHX5249

## 2019-06-24 LAB — CBC WITH DIFFERENTIAL/PLATELET
Abs Immature Granulocytes: 0.12 10*3/uL — ABNORMAL HIGH (ref 0.00–0.07)
Basophils Absolute: 0 10*3/uL (ref 0.0–0.1)
Basophils Relative: 0 %
Eosinophils Absolute: 0 10*3/uL (ref 0.0–0.5)
Eosinophils Relative: 0 %
HCT: 30.8 % — ABNORMAL LOW (ref 39.0–52.0)
Hemoglobin: 9.7 g/dL — ABNORMAL LOW (ref 13.0–17.0)
Immature Granulocytes: 1 %
Lymphocytes Relative: 6 %
Lymphs Abs: 1 10*3/uL (ref 0.7–4.0)
MCH: 30.8 pg (ref 26.0–34.0)
MCHC: 31.5 g/dL (ref 30.0–36.0)
MCV: 97.8 fL (ref 80.0–100.0)
Monocytes Absolute: 2.6 10*3/uL — ABNORMAL HIGH (ref 0.1–1.0)
Monocytes Relative: 15 %
Neutro Abs: 13.2 10*3/uL — ABNORMAL HIGH (ref 1.7–7.7)
Neutrophils Relative %: 78 %
Platelets: 153 10*3/uL (ref 150–400)
RBC: 3.15 MIL/uL — ABNORMAL LOW (ref 4.22–5.81)
RDW: 13.1 % (ref 11.5–15.5)
WBC: 17 10*3/uL — ABNORMAL HIGH (ref 4.0–10.5)
nRBC: 0 % (ref 0.0–0.2)

## 2019-06-24 LAB — BASIC METABOLIC PANEL
Anion gap: 11 (ref 5–15)
BUN: 10 mg/dL (ref 6–20)
CO2: 33 mmol/L — ABNORMAL HIGH (ref 22–32)
Calcium: 8.4 mg/dL — ABNORMAL LOW (ref 8.9–10.3)
Chloride: 95 mmol/L — ABNORMAL LOW (ref 98–111)
Creatinine, Ser: 0.46 mg/dL — ABNORMAL LOW (ref 0.61–1.24)
GFR calc Af Amer: >60 mL/min (ref >60)
GFR calc non Af Amer: >60 mL/min (ref >60)
Glucose, Bld: 107 mg/dL — ABNORMAL HIGH (ref 70–99)
Potassium: 3.2 mmol/L — ABNORMAL LOW (ref 3.5–5.1)
Sodium: 139 mmol/L (ref 135–145)

## 2019-06-24 LAB — GLUCOSE, CAPILLARY
Glucose-Capillary: 101 mg/dL — ABNORMAL HIGH (ref 70–99)
Glucose-Capillary: 103 mg/dL — ABNORMAL HIGH (ref 70–99)
Glucose-Capillary: 111 mg/dL — ABNORMAL HIGH (ref 70–99)
Glucose-Capillary: 112 mg/dL — ABNORMAL HIGH (ref 70–99)
Glucose-Capillary: 120 mg/dL — ABNORMAL HIGH (ref 70–99)
Glucose-Capillary: 90 mg/dL (ref 70–99)
Glucose-Capillary: 97 mg/dL (ref 70–99)

## 2019-06-24 LAB — TRIGLYCERIDES: Triglycerides: 77 mg/dL (ref <150)

## 2019-06-24 LAB — PHOSPHORUS: Phosphorus: 3.2 mg/dL (ref 2.5–4.6)

## 2019-06-24 LAB — MAGNESIUM: Magnesium: 1.9 mg/dL (ref 1.7–2.4)

## 2019-06-24 SURGERY — INSERTION OF GASTROSTOMY TUBE
Anesthesia: General | Site: Abdomen

## 2019-06-24 MED ORDER — BUPIVACAINE-EPINEPHRINE 0.25% -1:200000 IJ SOLN
INTRAMUSCULAR | Status: DC | PRN
  Administered 2019-06-24: 30 mL

## 2019-06-24 MED ORDER — PHENYLEPHRINE HCL (PRESSORS) 10 MG/ML IV SOLN
INTRAVENOUS | Status: DC | PRN
  Administered 2019-06-24: 200 ug via INTRAVENOUS
  Administered 2019-06-24 (×3): 100 ug via INTRAVENOUS

## 2019-06-24 MED ORDER — PHENYLEPHRINE HCL (PRESSORS) 10 MG/ML IV SOLN
INTRAVENOUS | Status: AC
  Filled 2019-06-24: qty 1

## 2019-06-24 MED ORDER — ONDANSETRON HCL 4 MG/2ML IJ SOLN
INTRAMUSCULAR | Status: AC
  Filled 2019-06-24: qty 2

## 2019-06-24 MED ORDER — FENTANYL CITRATE (PF) 100 MCG/2ML IJ SOLN
INTRAMUSCULAR | Status: DC | PRN
  Administered 2019-06-24 (×2): 50 ug via INTRAVENOUS

## 2019-06-24 MED ORDER — ACETAMINOPHEN 650 MG RE SUPP: 650.0000 mg | Freq: Four times a day (QID) | RECTAL | Status: DC | PRN

## 2019-06-24 MED ORDER — SUGAMMADEX SODIUM 500 MG/5ML IV SOLN
INTRAVENOUS | Status: AC
  Filled 2019-06-24: qty 5

## 2019-06-24 MED ORDER — FENTANYL CITRATE (PF) 100 MCG/2ML IJ SOLN
INTRAMUSCULAR | Status: AC
  Filled 2019-06-24: qty 2

## 2019-06-24 MED ORDER — ROCURONIUM BROMIDE 100 MG/10ML IV SOLN
INTRAVENOUS | Status: DC | PRN
  Administered 2019-06-24: 20 mg via INTRAVENOUS
  Administered 2019-06-24: 50 mg via INTRAVENOUS
  Administered 2019-06-24: 20 mg via INTRAVENOUS

## 2019-06-24 MED ORDER — BUPIVACAINE LIPOSOME 1.3 % IJ SUSP
INTRAMUSCULAR | Status: DC | PRN
  Administered 2019-06-24: 20 mL

## 2019-06-24 MED ORDER — MIDAZOLAM HCL 2 MG/2ML IJ SOLN
INTRAMUSCULAR | Status: DC | PRN
  Administered 2019-06-24: 2 mg via INTRAVENOUS

## 2019-06-24 MED ORDER — MIDAZOLAM HCL 2 MG/2ML IJ SOLN
INTRAMUSCULAR | Status: AC
  Filled 2019-06-24: qty 2

## 2019-06-24 MED ORDER — LACTATED RINGERS IV SOLN: INTRAVENOUS | Status: DC | PRN

## 2019-06-24 MED ORDER — ROCURONIUM BROMIDE 10 MG/ML (PF) SYRINGE
PREFILLED_SYRINGE | INTRAVENOUS | Status: AC
  Filled 2019-06-24: qty 10

## 2019-06-24 MED ORDER — POTASSIUM CHLORIDE 10 MEQ/50ML IV SOLN
10.0000 meq | INTRAVENOUS | Status: AC
  Administered 2019-06-24 (×4): 10 meq via INTRAVENOUS
  Filled 2019-06-24 (×4): qty 50

## 2019-06-24 MED ORDER — GLYCOPYRROLATE 0.2 MG/ML IJ SOLN
INTRAMUSCULAR | Status: AC
  Filled 2019-06-24: qty 1

## 2019-06-24 MED ORDER — PROPOFOL 10 MG/ML IV BOLUS
INTRAVENOUS | Status: DC | PRN
  Administered 2019-06-24 (×2): 50 mg via INTRAVENOUS

## 2019-06-24 MED ORDER — SUCCINYLCHOLINE CHLORIDE 200 MG/10ML IV SOSY
PREFILLED_SYRINGE | INTRAVENOUS | Status: AC
  Filled 2019-06-24: qty 10

## 2019-06-24 MED ORDER — ONDANSETRON HCL 4 MG/2ML IJ SOLN
INTRAMUSCULAR | Status: DC | PRN
  Administered 2019-06-24: 4 mg via INTRAVENOUS

## 2019-06-24 SURGICAL SUPPLY — 66 items
BAG BILE T-TUBES STRL (MISCELLANEOUS) ×3 IMPLANT
CANISTER SUCT 1200ML W/VALVE (MISCELLANEOUS) ×3 IMPLANT
CANNULA REDUC XI 12-8 STAPL (CANNULA) ×1
CANNULA REDUC XI 12-8MM STAPL (CANNULA) ×1
CANNULA REDUCER 12-8 DVNC XI (CANNULA) ×1 IMPLANT
CHLORAPREP W/TINT 26 (MISCELLANEOUS) ×3 IMPLANT
COVER TIP SHEARS 8 DVNC (MISCELLANEOUS) ×1 IMPLANT
COVER TIP SHEARS 8MM DA VINCI (MISCELLANEOUS) ×2
COVER WAND RF STERILE (DRAPES) ×3 IMPLANT
DEFOGGER SCOPE WARMER CLEARIFY (MISCELLANEOUS) ×3 IMPLANT
DERMABOND ADVANCED (GAUZE/BANDAGES/DRESSINGS) ×2
DERMABOND ADVANCED .7 DNX12 (GAUZE/BANDAGES/DRESSINGS) ×1 IMPLANT
DRAPE ARM DVNC X/XI (DISPOSABLE) ×4 IMPLANT
DRAPE COLUMN DVNC XI (DISPOSABLE) ×1 IMPLANT
DRAPE DA VINCI XI ARM (DISPOSABLE) ×8
DRAPE DA VINCI XI COLUMN (DISPOSABLE) ×2
ELECT CAUTERY BLADE 6.4 (BLADE) ×3 IMPLANT
ELECT REM PT RETURN 9FT ADLT (ELECTROSURGICAL) ×3
ELECTRODE REM PT RTRN 9FT ADLT (ELECTROSURGICAL) ×1 IMPLANT
GLOVE BIOGEL M 7.0 STRL (GLOVE) ×6 IMPLANT
GOWN STRL REUS W/ TWL LRG LVL3 (GOWN DISPOSABLE) ×3 IMPLANT
GOWN STRL REUS W/TWL LRG LVL3 (GOWN DISPOSABLE) ×6
IRRIGATION STRYKERFLOW (MISCELLANEOUS) IMPLANT
IRRIGATOR STRYKERFLOW (MISCELLANEOUS)
KIT PINK PAD W/HEAD ARE REST (MISCELLANEOUS) ×3
KIT PINK PAD W/HEAD ARM REST (MISCELLANEOUS) ×1 IMPLANT
LABEL OR SOLS (LABEL) ×3 IMPLANT
NEEDLE HYPO 22GX1.5 SAFETY (NEEDLE) ×3 IMPLANT
OBTURATOR OPTICAL STANDARD 8MM (TROCAR) ×2
OBTURATOR OPTICAL STND 8 DVNC (TROCAR) ×1
OBTURATOR OPTICALSTD 8 DVNC (TROCAR) ×1 IMPLANT
PACK LAP CHOLECYSTECTOMY (MISCELLANEOUS) ×3 IMPLANT
PENCIL ELECTRO HAND CTR (MISCELLANEOUS) ×3 IMPLANT
POUCH SPECIMEN RETRIEVAL 10MM (ENDOMECHANICALS) IMPLANT
SCISSORS METZENBAUM CVD 33 (INSTRUMENTS) ×3 IMPLANT
SCISSORS MNPLR CVD DVNC XI (INSTRUMENTS) IMPLANT
SCISSORS XI MNPLR CVD DVNC (INSTRUMENTS)
SEAL CANN UNIV 5-8 DVNC XI (MISCELLANEOUS) ×2 IMPLANT
SEAL XI 5MM-8MM UNIVERSAL (MISCELLANEOUS) ×4
SEALER VESSEL DA VINCI XI (MISCELLANEOUS)
SEALER VESSEL EXT DVNC XI (MISCELLANEOUS) IMPLANT
SOLUTION ELECTROLUBE (MISCELLANEOUS) ×3 IMPLANT
SPONGE DRAIN TRACH 4X4 STRL 2S (GAUZE/BANDAGES/DRESSINGS) ×3 IMPLANT
SPONGE LAP 18X18 RF (DISPOSABLE) ×6 IMPLANT
SPONGE LAP 4X18 RFD (DISPOSABLE) ×3 IMPLANT
STAPLER CANNULA SEAL DVNC XI (STAPLE) ×1 IMPLANT
STAPLER CANNULA SEAL XI (STAPLE) ×2
SUT ETHILON 3-0 FS-10 30 BLK (SUTURE) ×3
SUT MNCRL 4-0 (SUTURE) ×4
SUT MNCRL 4-0 27XMFL (SUTURE) ×2
SUT SILK 2 0 (SUTURE)
SUT SILK 2-0 18XBRD TIE 12 (SUTURE) IMPLANT
SUT V-LOC 90 ABS 3-0 VLT  V-20 (SUTURE) ×2
SUT V-LOC 90 ABS 3-0 VLT V-20 (SUTURE) ×1 IMPLANT
SUT VIC AB 2-0 SH 27 (SUTURE) ×8
SUT VIC AB 2-0 SH 27XBRD (SUTURE) ×4 IMPLANT
SUT VICRYL 0 AB UR-6 (SUTURE) ×9 IMPLANT
SUT VLOC 90 S/L VL9 GS22 (SUTURE) ×6 IMPLANT
SUTURE EHLN 3-0 FS-10 30 BLK (SUTURE) ×1 IMPLANT
SUTURE MNCRL 4-0 27XMF (SUTURE) ×2 IMPLANT
SYR 30ML LL (SYRINGE) ×3 IMPLANT
SYR TOOMEY 50ML (SYRINGE) ×3 IMPLANT
TAPE TRANSPORE STRL 2 31045 (GAUZE/BANDAGES/DRESSINGS) ×3 IMPLANT
TROCAR BALLN GELPORT 12X130M (ENDOMECHANICALS) ×3 IMPLANT
TUBE JEJUNO 18X14 (TUBING) ×3 IMPLANT
TUBING EVAC SMOKE HEATED PNEUM (TUBING) ×3 IMPLANT

## 2019-06-24 NOTE — Anesthesia Postprocedure Evaluation (Addendum)
Anesthesia Post Note  Patient: Chris Case  Procedure(s) Performed: INSERTION OF GASTROSTOMY TUBE ROBOT ASSISTED (N/A Abdomen)  Patient location during evaluation: SICU Anesthesia Type: General Level of consciousness: sedated Pain management: pain level controlled Vital Signs Assessment: post-procedure vital signs reviewed and stable Respiratory status: trach. Cardiovascular status: stable Postop Assessment: no apparent nausea or vomiting Anesthetic complications: no     Last Vitals:  Vitals:   06/24/19 1000 06/24/19 1427  BP: (!) 127/92   Pulse: 97   Resp: (!) 21   Temp:    SpO2: 97% 95%    Last Pain:  Vitals:   06/24/19 0800  TempSrc: Axillary  PainSc:                  Cleda Mccreedy Latasha Buczkowski

## 2019-06-24 NOTE — Progress Notes (Signed)
PT Cancellation Note  Patient Details Name: Chris Case MRN: [id number] DOB: 21-Dec-1970   Cancelled Treatment:    Reason Eval/Treat Not Completed: (Consult received and chart reviewed.  Patient currently off unit for diagnostic procedure (G-tube placement).  Will re-attempt at later time/date as medically appropriate and available.)   Deshon Koslowski H. Manson Passey, PT, DPT, NCS 06/24/19, 11:08 AM [phone]

## 2019-06-24 NOTE — Progress Notes (Signed)
PHARMACY CONSULT NOTE  Pharmacy Consult for Electrolyte Monitoring and Replacement   Recent Labs: Potassium (mmol/L)  Date Value  06/24/2019 3.2 (L)  09/24/2013 4.6   Magnesium (mg/dL)  Date Value  35/32/9924 1.9   Calcium (mg/dL)  Date Value  26/83/4196 8.4 (L)   Calcium, Total (mg/dL)  Date Value  22/29/7989 8.9   Albumin (g/dL)  Date Value  21/19/4174 3.5   Phosphorus (mg/dL)  Date Value  10/15/4816 3.2   Sodium (mmol/L)  Date Value  06/24/2019 139  09/24/2013 138     Assessment: 49 year old male admitted after being found unresponsive. Patient was intubated in the ED and transferred to the ICU. Patient started on tube feeds and at refeeding risk.  Patient extubated 4/15 am and placed on BiPAP. Patient required reintubation.   Trach 4/20. Plan for G tube placement 4/22.  Goal of Therapy:  Electrolytes WNL  Plan:  Patient receiving potassium 10 mEq IV x 4. Labs have been ordered daily. Will follow and replace as indicated.  Pricilla Riffle, PharmD Clinical Pharmacist 06/24/2019 9:56 AM

## 2019-06-24 NOTE — Plan of Care (Signed)
  Problem: Education: Goal: Knowledge of General Education information will improve Description: Including pain rating scale, medication(s)/side effects and non-pharmacologic comfort measures Outcome: Progressing   Problem: Health Behavior/Discharge Planning: Goal: Ability to manage health-related needs will improve Outcome: Progressing   Problem: Clinical Measurements: Goal: Ability to maintain clinical measurements within normal limits will improve Outcome: Progressing Goal: Will remain free from infection Outcome: Progressing Goal: Diagnostic test results will improve Outcome: Progressing Goal: Respiratory complications will improve Outcome: Progressing Goal: Cardiovascular complication will be avoided Outcome: Progressing   Problem: Nutrition: Goal: Adequate nutrition will be maintained Outcome: Progressing   Problem: Elimination: Goal: Will not experience complications related to bowel motility Outcome: Progressing Goal: Will not experience complications related to urinary retention Outcome: Progressing   Problem: Pain Managment: Goal: General experience of comfort will improve Outcome: Progressing   Problem: Safety: Goal: Ability to remain free from injury will improve Outcome: Progressing   Problem: Skin Integrity: Goal: Risk for impaired skin integrity will decrease Outcome: Progressing   Problem: Activity: Goal: Ability to tolerate increased activity will improve Outcome: Progressing   Problem: Respiratory: Goal: Ability to maintain a clear airway and adequate ventilation will improve Outcome: Progressing   Problem: Role Relationship: Goal: Method of communication will improve Outcome: Progressing   Problem: Education: Goal: Knowledge of disease or condition will improve Outcome: Progressing Goal: Knowledge of the prescribed therapeutic regimen will improve Outcome: Progressing Goal: Individualized Educational Video(s) Outcome: Progressing    Problem: Activity: Goal: Ability to tolerate increased activity will improve Outcome: Progressing Goal: Will verbalize the importance of balancing activity with adequate rest periods Outcome: Progressing   Problem: Respiratory: Goal: Ability to maintain a clear airway will improve Outcome: Progressing Goal: Levels of oxygenation will improve Outcome: Progressing Goal: Ability to maintain adequate ventilation will improve Outcome: Progressing

## 2019-06-24 NOTE — Progress Notes (Signed)
OT Cancellation Note  Patient Details Name: Ashrith Sagan MRN: [id number] DOB: [birth year]   Cancelled Treatment:    Reason Eval/Treat Not Completed: Patient at procedure or test/ unavailable. Consult received and chart reviewed.  Patient currently off unit for diagnostic procedure (G-tube placement).  Will re-attempt at later time/date as medically appropriate and available.  Rockney Ghee, M.S., OTR/L Ascom: [phone] 06/24/19, 11:12 AM

## 2019-06-24 NOTE — Op Note (Signed)
Robotic assisted laparoscopic gastrostomy tube   Pre-operative Diagnosis: malnutrition   Post-operative Diagnosis: same   Procedure:  Robotic assisted laparoscopic G tube with extensive lysis of adhesions taking at least 90 minutes of total operative time   Surgeon: Caroleen Hamman, MD FACS   Anesthesia: Gen. with endotracheal tube   Findings: Extensive adhesions from the small bowel to the abd wall, interloop adhesions. G tube in good position w/o leaks. challenging case due to dense and extensive adhesions   Estimated Blood Loss:10 cc             Complications: none     Procedure Details  The patient was seen again in the Holding Room. The benefits, complications, treatment options, and expected outcomes were discussed with the Family. The risks of bleeding, infection, recurrence of symptoms, failure to resolve symptoms, bowel injury, any of which could require further surgery were reviewed .   The  family concurred with the proposed plan, giving informed consent.  The patient was taken to Operating Room, identified  and the procedure verified. A Time Out was held and the above information confirmed.   Prior to the induction of general anesthesia, antibiotic prophylaxis was administered. VTE prophylaxis was in place. General endotracheal anesthesia was then administered and tolerated well. After the induction, the abdomen was prepped with Chloraprep and draped in the sterile fashion. The patient was positioned in the supine position.   Supraumbilical Cut down technique was used to enter the abdominal cavity , I selected this point since it was away from the previous laparotomy scar. I was extremely carefull entering the abdomen and I used 15 blade knife to incise the fascia after this was elevated. I increased my fascial incision to have good exposure. As expected a piece of bowel was stucked to the abdominal wall, I was able to freed it using 15 blade knife. Dense adhesion were take down  sharply. Small serosal tear was created due to the nature of his adhesive disease and was impossible to avoid. I reinforced the partial tear with interrupted 2-0 vicryls. I was able to perform the majority of the lysis of adhesion under direct visualization and in an open fashion.  A Hasson trochar was placed after two vicryl stitches were anchored to the fascia. Pneumoperitoneum was then created with CO2 and tolerated well without any adverse changes in the patient's vital signs.  Two 8-mm ports were placed under direct vision.  Inspection revealed Colon close to stomach. I grasped the body  stomach and tented toward the abdominal wall, small left subcostal incision created to placed Gastrostomy tube under direct visualization.   The patient was positioned  in reverse Trendelenburg, robot was brought to the surgical field and docked in the standard fashion. We made sure all the instrumentation was kept indirect view at all times and that there were no collision between the arms. I scrubbed out and went to the console.  Using the robotic scissors I performed a gastrotomy and confirmed that I was intraluminally. A partial pursestring suture was placed using 3 0 V-Loc suture around the G-tube in an inner fashion. I placed the G-tube through the gastrotomy in direct fashion.  I was able to get my scrub to flush saline via the G-tube confirming the proper position of the tube intraluminally.  The balloon was inflated and pulled back.  We then were able to tied the first pursestring in the standard fashion and completing our circumferential outer pursestring with the 2 V-Loc sutures Inspection  of the  upper quadrant was performed. No bleeding, bile duct injury or leak, or bowel injury was noted.  All the needles were removed under direct visualization. Robotic instruments and robotic arms were undocked in the standard fashion.  I scrubbed back in. Liposomal Marcaine was used to infiltrate the abdominal wall  at all incision sites   Pneumoperitoneum was released.  The periumbilical port site was closed with interrumpted 0 Vicryl sutures. 4-0 subcuticular Monocryl was used to close the skin. Dermabond was  applied.  The patient was then extubated and brought to the recovery room in stable condition. Sponge, lap, and needle counts were correct at closure and at the conclusion of the case.               Sterling Big, MD, FACS

## 2019-06-24 NOTE — Progress Notes (Signed)
CRITICAL CARE NOTE  49 year old former smoker with a history very severe COPD stage IV, who presented via EMS after being found down and unresponsive.  Apparently the patient's mother placed a call to EMS.  The patient has a longstanding history of COPD and noncompliance with his oxygen and CPAP.  History could not be obtained from the patient as there was no family available and the patient had been intubated and was mechanically ventilated in the ED.  Of note he had recently been admitted to [facility] 29 May 2019 through 03 June 2019.  He had been evaluated by palliative care during this admission and actually had a DNR order.   Patient also had a 2D echo  with pulmonary hypertension consistent with cor pulmonale.  Records review: Patient has been following with Dr. Ned Clines at [facility].  Last known PFTs 11 September 2017.  FEV1 0.52 L or 20% predicted, FVC 1.49 L or 45% of predicted, FEV1/FVC 35%.  FEF 25-75 6%.  DLCO was 33%.  This is consistent with very severe COPD.  Events: 4/13 admitted for severe resp failure from COPD 4/14 remains critically ill on vent 4/15 remains critically ill, severe COPD, failed SAT/SBT 4/16 extubated yesterday, had to be reintubated remains on ventilator 4/17 sedated on the ventilator 4/18 abdominal distention, CT abdomen and pelvis consistent with ileus and constipation 4/19-patietn remains on ventialtor awainting trach 4/20 - patient is on vent with trach, he had episodes of aggitation today but eventually improved  4/21 - patient with significant distension of abdomen, concern for aerophagia in NIV.  There is mucopurulent exudation from trache this was cultures and patient placed on Unasyn for tracheitis 06/24/19 -trache site improved with less malodorous pus draining, NGT with decompression worked well abdomen is less distended. S/p PEG  CC  Severe COPD Severe resp failure  SUBJ Extubated 4/11 Reintubated same day due to worsening respiratory  failure  Apparently agreed to tracheostomy and PEG tube Currently sedated, cannot participate in discussion Will likely remain ventilator dependent for life    BP (!) 123/91   Pulse 97   Temp 100.3 F (37.9 C) (Axillary)   Resp 18   Ht 5\' 6"  (1.676 m)   Wt 57.7 kg   SpO2 97%   BMI 20.53 kg/m    I/O last 3 completed shifts: In: 3061.5 [I.V.:2415.4; IV Piggyback:646.2] Out: 3150 [Urine:2050; Emesis/NG output:1100] Total I/O In: 354.2 [I.V.:254.2; IV Piggyback:100] Out: 200 [Emesis/NG output:200]  SpO2: 97 % O2 Flow Rate (L/min): 28 L/min FiO2 (%): 30 %  Estimated body mass index is 20.53 kg/m as calculated from the following:   Height as of this encounter: 5\' 6"  (1.676 m).   Weight as of this encounter: 57.7 kg.  REVIEW OF SYSTEMS  10 point ROS done and is neg except as per subjective findings  PHYSICAL EXAMINATION: Physical Exam  Constitutional: No distress.  HENT:  Head: Normocephalic and atraumatic.    Cardiovascular: Regular rhythm.  Pulmonary/Chest: Breath sounds normal. No accessory muscle usage. Tachypnea noted. He is in respiratory distress.  Abdominal: Bowel sounds are normal. He exhibits no distension, no fluid wave and no ascites. There is no abdominal tenderness.  Neurological: He is alert.       MEDICATIONS: I have reviewed all medications and confirmed regimen as documented   CULTURE RESULTS   Recent Results (from the past 240 hour(s))  Culture, blood (routine x 2)     Status: Abnormal   Collection Time: 06/15/19  5:00 AM  Specimen: BLOOD  Result Value Ref Range Status   Specimen Description   Final    BLOOD RIGHT BICEP Performed at [facility], [address] [place], [place] [address]    Special Requests   Final    BOTTLES DRAWN AEROBIC AND ANAEROBIC Blood Culture adequate volume Performed at [facility], [address] [place], [place] [address]    Culture  Setup Time   Final    GRAM POSITIVE  COCCI AEROBIC BOTTLE ONLY CRITICAL RESULT CALLED TO, READ BACK BY AND VERIFIED WITH: CHRISTINE KATSOUDAS AT 0753 ON 06/16/2019 [facility].    Culture (A)  Final    STAPHYLOCOCCUS SPECIES (COAGULASE NEGATIVE) THE SIGNIFICANCE OF ISOLATING THIS ORGANISM FROM A SINGLE SET OF BLOOD CULTURES WHEN MULTIPLE SETS ARE DRAWN IS UNCERTAIN. PLEASE NOTIFY THE MICROBIOLOGY DEPARTMENT WITHIN ONE WEEK IF SPECIATION AND SENSITIVITIES ARE REQUIRED. Performed at [facility], [address] [place], [place] [address]    Report Status 06/17/2019 FINAL  Final  Culture, blood (routine x 2)     Status: None   Collection Time: 06/15/19  5:00 AM   Specimen: BLOOD  Result Value Ref Range Status   Specimen Description BLOOD LEFT AC  Final   Special Requests   Final    BOTTLES DRAWN AEROBIC AND ANAEROBIC Blood Culture adequate volume   Culture   Final    NO GROWTH 5 DAYS Performed at [facility], [address] [place], [place] [address]    Report Status 06/20/2019 FINAL  Final  Respiratory Panel by RT PCR (Flu A&B, Covid) - Nasopharyngeal Swab     Status: None   Collection Time: 06/15/19  5:00 AM   Specimen: Nasopharyngeal Swab  Result Value Ref Range Status   SARS Coronavirus 2 by RT PCR NEGATIVE NEGATIVE Final    Comment: (NOTE) SARS-CoV-2 target nucleic acids are NOT DETECTED. The SARS-CoV-2 RNA is generally detectable in upper respiratoy specimens during the acute phase of infection. The lowest concentration of SARS-CoV-2 viral copies this assay can detect is 131 copies/mL. A negative result does not preclude SARS-Cov-2 infection and should not be used as the sole basis for treatment or other patient management decisions. A negative result may occur with  improper specimen collection/handling, submission of specimen other than nasopharyngeal swab, presence of viral mutation(s) within the areas targeted by this assay, and inadequate number of viral copies (<131 copies/mL). A negative result  must be combined with clinical observations, patient history, and epidemiological information. The expected result is Negative. Fact Sheet for Patients:  https://www.moore.com/ Fact Sheet for Healthcare Providers:  https://www.young.biz/ This test is not yet ap proved or cleared by the [place] FDA and  has been authorized for detection and/or diagnosis of SARS-CoV-2 by FDA under an Emergency Use Authorization (EUA). This EUA will remain  in effect (meaning this test can be used) for the duration of the COVID-19 declaration under Section 564(b)(1) of the Act, 21 U.S.C. section 360bbb-3(b)(1), unless the authorization is terminated or revoked sooner.    Influenza A by PCR NEGATIVE NEGATIVE Final   Influenza B by PCR NEGATIVE NEGATIVE Final    Comment: (NOTE) The Xpert Xpress SARS-CoV-2/FLU/RSV assay is intended as an aid in  the diagnosis of influenza from Nasopharyngeal swab specimens and  should not be used as a sole basis for treatment. Nasal washings and  aspirates are unacceptable for Xpert Xpress SARS-CoV-2/FLU/RSV  testing. Fact Sheet for Patients: https://www.moore.com/ Fact Sheet for Healthcare Providers: https://www.young.biz/ This test  is not yet approved or cleared by the [place] FDA and  has been authorized for detection and/or diagnosis of SARS-CoV-2 by  FDA under an Emergency Use Authorization (EUA). This EUA will remain  in effect (meaning this test can be used) for the duration of the  Covid-19 declaration under Section 564(b)(1) of the Act, 21  U.S.C. section 360bbb-3(b)(1), unless the authorization is  terminated or revoked. Performed at [facility], [address] [place], [place] [address]   Urine Culture     Status: None   Collection Time: 06/15/19  5:00 AM   Specimen: Urine, Random  Result Value Ref Range Status   Specimen Description   Final    URINE,  RANDOM Performed at [facility], [address] [place], [place] [address]    Special Requests   Final    NONE Performed at [facility], [address] [place], [place] [address]    Culture   Final    NO GROWTH Performed at [facility], [address] [place], [place] [address]    Report Status 06/16/2019 FINAL  Final  Blood Culture ID Panel (Reflexed)     Status: Abnormal   Collection Time: 06/15/19  5:00 AM  Result Value Ref Range Status   Enterococcus species NOT DETECTED NOT DETECTED Final   Listeria monocytogenes NOT DETECTED NOT DETECTED Final   Staphylococcus species DETECTED (A) NOT DETECTED Final    Comment: Methicillin (oxacillin) susceptible coagulase negative staphylococcus. Possible blood culture contaminant (unless isolated from more than one blood culture draw or clinical case suggests pathogenicity). No antibiotic treatment is indicated for blood  culture contaminants. CRITICAL RESULT CALLED TO, READ BACK BY AND VERIFIED WITH: CHRISTINE KATSOUDAS AT 0753 ON 06/16/2019 [facility].    Staphylococcus aureus (BCID) NOT DETECTED NOT DETECTED Final   Methicillin resistance NOT DETECTED NOT DETECTED Final   Streptococcus species NOT DETECTED NOT DETECTED Final   Streptococcus agalactiae NOT DETECTED NOT DETECTED Final   Streptococcus pneumoniae NOT DETECTED NOT DETECTED Final   Streptococcus pyogenes NOT DETECTED NOT DETECTED Final   Acinetobacter baumannii NOT DETECTED NOT DETECTED Final   Enterobacteriaceae species NOT DETECTED NOT DETECTED Final   Enterobacter cloacae complex NOT DETECTED NOT DETECTED Final   Escherichia coli NOT DETECTED NOT DETECTED Final   Klebsiella oxytoca NOT DETECTED NOT DETECTED Final   Klebsiella pneumoniae NOT DETECTED NOT DETECTED Final   Proteus species NOT DETECTED NOT DETECTED Final   Serratia marcescens NOT DETECTED NOT DETECTED Final   Haemophilus influenzae NOT DETECTED NOT DETECTED Final   Neisseria  meningitidis NOT DETECTED NOT DETECTED Final   Pseudomonas aeruginosa NOT DETECTED NOT DETECTED Final   Candida albicans NOT DETECTED NOT DETECTED Final   Candida glabrata NOT DETECTED NOT DETECTED Final   Candida krusei NOT DETECTED NOT DETECTED Final   Candida parapsilosis NOT DETECTED NOT DETECTED Final   Candida tropicalis NOT DETECTED NOT DETECTED Final    Comment: Performed at [facility], [address] [place], [place] [address]  MRSA PCR Screening     Status: None   Collection Time: 06/15/19 11:36 AM   Specimen: Nasopharyngeal  Result Value Ref Range Status   MRSA by PCR NEGATIVE NEGATIVE Final    Comment:        The GeneXpert MRSA Assay (FDA approved for NASAL specimens only), is one component of a comprehensive MRSA colonization surveillance program. It is not intended to diagnose MRSA infection nor to guide or monitor  treatment for MRSA infections. Performed at [facility], [address] [place], [place] [address]   Aerobic Culture (superficial specimen)     Status: None (Preliminary result)   Collection Time: 06/23/19  4:55 PM   Specimen: Neck  Result Value Ref Range Status   Specimen Description   Final    NECK Performed at [facility], [address] [place], [place] [address]    Special Requests   Final    NONE Performed at [facility], [address] [place], [place] [address]    Gram Stain   Final    NO WBC SEEN ABUNDANT GRAM POSITIVE COCCI ABUNDANT GRAM NEGATIVE RODS ABUNDANT GRAM POSITIVE RODS Performed at [facility], [address], [place], [place] [address]    Culture PENDING  Incomplete   Report Status PENDING  Incomplete        BMP Latest Ref Rng & Units 06/24/2019 06/23/2019 06/22/2019  Glucose 70 - 99 mg/dL 638(V) 564(P) 95  BUN 6 - 20 mg/dL 10 11 15   Creatinine 0.61 - 1.24 mg/dL ) 3.29(J) 1.88(C)  Sodium 135 - 145 mmol/L 139 138 141  Potassium 3.5 - 5.1 mmol/L 3.2(L) 3.3(L)  3.7  Chloride 98 - 111 mmol/L 95(L) 97(L) 99  CO2 22 - 32 mmol/L 33(H) 35(H) 37(H)  Calcium 8.9 - 10.3 mg/dL 1.66(A) 6.3(K) 1.6(W)         Indwelling Urinary Catheter continued, requirement due to   Reason to continue Indwelling Urinary Catheter strict Intake/Output monitoring for hemodynamic instability         Ventilator continued, requirement due to severe respiratory failure   Ventilator Sedation RASS 0 to -2      ASSESSMENT AND PLAN SYNOPSIS  Acute on chronic hypercarbic/hypoxic respiratory failure COPD exacerbation s/ptracheostomy - there is mucopurulent exudation concerning for tracheitis - will culture and treat empirically with Unasyn Bronchodilators: DuoNeb every 4 and as needed IV corticosteroids There continues to be no indication for antibiotics at present  Severe COPD stage IV  Noted noncompliance with oxygen/BiPAP at home -continue LAMA/LABA/ICS   Acute encephalopathy Unresponsiveness Due to hypercarbia Has resolved   Hypoglycemia Due to poor nutritional status Continue tube feeds Add D10 ICU hypoglycemia protocol Poor prognostic sign  Acute kidney injury  Avoid nephrotoxins Stable with no worsening Trend renal panel  Hyperkalemia Likely triggered by acidosis RESOLVED  ID Procalcitonin normal No infiltrate on chest x-ray No indication for antibiotics at present Continue to trend WBC/fever curve Had 1 single blood culture of MRSA likely contaminant [name] MRSA PCR negative  Abdominal distention Ileus CT abdomen and pelvis: No obstruction: No free air Findings likely due to ileus, constipation Bowel regimen  Prophylaxis: Lovenox Famotidine    Critical care provider statement:    Critical care time (minutes):  35   Critical care time was exclusive of:  Separately billable procedures and  treating other patients   Critical care was necessary to treat or prevent imminent or  life-threatening deterioration of the  following conditions:  Acute hypercapnic respiratory failure requiring tracheostomy   Critical care was time spent personally by me on the following  activities:  Development of treatment plan with patient or surrogate,  discussions with consultants, evaluation of patient's response to  treatment, examination of patient, obtaining history from patient or  surrogate, ordering and performing treatments and interventions, ordering  and review of laboratory studies and re-evaluation of patient's condition   I assumed direction of critical care for this patient  from another  provider in my specialty: no       Ottie Glazier, M.D.  [facility] [place]

## 2019-06-24 NOTE — Transfer of Care (Signed)
Immediate Anesthesia Transfer of Care Note  Patient: Chris Case  Procedure(s) Performed: INSERTION OF GASTROSTOMY TUBE ROBOT ASSISTED (N/A Abdomen)  Patient Location: PACU and ICU  Anesthesia Type:General  Level of Consciousness: sedated  Airway & Oxygen Therapy: Patient Spontanous Breathing and Patient placed on Ventilator (see vital sign flow sheet for setting)  Post-op Assessment: Report given to RN and Post -op Vital signs reviewed and stable  Post vital signs: Reviewed and stable  Last Vitals:  Vitals Value Taken Time  BP    Temp    Pulse    Resp 20 06/24/19 1426  SpO2    Vitals shown include unvalidated device data.  Last Pain:  Vitals:   06/24/19 0800  TempSrc: Axillary  PainSc:       Patients Stated Pain Goal: 0 (06/22/19 1600)  Complications: No apparent anesthesia complications

## 2019-06-24 NOTE — Progress Notes (Addendum)
Shift summary:  0800 hrs:  - Patient remains on ventilator this AM. - Awake and alert, calm and synchronous. - Plan for PEG today.  1040 hrs:  - Patient transported off unit by CRNA for OR for PEG placement.  1450 hrs:  - Patient back on the unit from OR. VSS.   2145 hrs:  - PRN rectal APAP given for fever. NP notified of fever.

## 2019-06-24 NOTE — Plan of Care (Signed)
Problem: Education: Goal: Knowledge of General Education information will improve Description: Including pain rating scale, medication(s)/side effects and non-pharmacologic comfort measures 06/24/2019 2012 by Jesse Sans, RN Outcome: Progressing 06/24/2019 0831 by Jesse Sans, RN Outcome: Progressing   Problem: Health Behavior/Discharge Planning: Goal: Ability to manage health-related needs will improve 06/24/2019 2012 by Jesse Sans, RN Outcome: Progressing 06/24/2019 0831 by Jesse Sans, RN Outcome: Progressing   Problem: Clinical Measurements: Goal: Ability to maintain clinical measurements within normal limits will improve 06/24/2019 2012 by Jesse Sans, RN Outcome: Progressing 06/24/2019 0831 by Jesse Sans, RN Outcome: Progressing Goal: Will remain free from infection 06/24/2019 2012 by Jesse Sans, RN Outcome: Progressing 06/24/2019 0831 by Jesse Sans, RN Outcome: Progressing Goal: Diagnostic test results will improve 06/24/2019 2012 by Jesse Sans, RN Outcome: Progressing 06/24/2019 0831 by Jesse Sans, RN Outcome: Progressing Goal: Respiratory complications will improve 06/24/2019 2012 by Jesse Sans, RN Outcome: Progressing 06/24/2019 0831 by Jesse Sans, RN Outcome: Progressing Goal: Cardiovascular complication will be avoided 06/24/2019 2012 by Jesse Sans, RN Outcome: Progressing 06/24/2019 0831 by Jesse Sans, RN Outcome: Progressing   Problem: Nutrition: Goal: Adequate nutrition will be maintained 06/24/2019 2012 by Jesse Sans, RN Outcome: Progressing 06/24/2019 0831 by Jesse Sans, RN Outcome: Progressing   Problem: Elimination: Goal: Will not experience complications related to bowel motility 06/24/2019 2012 by Jesse Sans, RN Outcome: Progressing 06/24/2019 0831 by Jesse Sans, RN Outcome: Progressing Goal: Will not experience complications related to urinary retention 06/24/2019 2012 by  Jesse Sans, RN Outcome: Progressing 06/24/2019 0831 by Jesse Sans, RN Outcome: Progressing   Problem: Pain Managment: Goal: General experience of comfort will improve 06/24/2019 2012 by Jesse Sans, RN Outcome: Progressing 06/24/2019 0831 by Jesse Sans, RN Outcome: Progressing   Problem: Safety: Goal: Ability to remain free from injury will improve 06/24/2019 2012 by Jesse Sans, RN Outcome: Progressing 06/24/2019 0831 by Jesse Sans, RN Outcome: Progressing   Problem: Skin Integrity: Goal: Risk for impaired skin integrity will decrease 06/24/2019 2012 by Jesse Sans, RN Outcome: Progressing 06/24/2019 0831 by Jesse Sans, RN Outcome: Progressing   Problem: Activity: Goal: Ability to tolerate increased activity will improve 06/24/2019 2012 by Jesse Sans, RN Outcome: Progressing 06/24/2019 0831 by Jesse Sans, RN Outcome: Progressing   Problem: Respiratory: Goal: Ability to maintain a clear airway and adequate ventilation will improve 06/24/2019 2012 by Jesse Sans, RN Outcome: Progressing 06/24/2019 0831 by Jesse Sans, RN Outcome: Progressing   Problem: Role Relationship: Goal: Method of communication will improve 06/24/2019 2012 by Jesse Sans, RN Outcome: Progressing 06/24/2019 0831 by Jesse Sans, RN Outcome: Progressing   Problem: Education: Goal: Knowledge of disease or condition will improve 06/24/2019 2012 by Jesse Sans, RN Outcome: Progressing 06/24/2019 0831 by Jesse Sans, RN Outcome: Progressing Goal: Knowledge of the prescribed therapeutic regimen will improve 06/24/2019 2012 by Jesse Sans, RN Outcome: Progressing 06/24/2019 0831 by Jesse Sans, RN Outcome: Progressing Goal: Individualized Educational Video(s) 06/24/2019 2012 by Jesse Sans, RN Outcome: Progressing 06/24/2019 0831 by Jesse Sans, RN Outcome: Progressing   Problem: Activity: Goal: Ability to tolerate  increased activity will improve 06/24/2019 2012 by Jesse Sans, RN Outcome: Progressing 06/24/2019 0831 by Jesse Sans, RN Outcome: Progressing Goal: Will verbalize the importance of balancing activity with adequate rest periods 06/24/2019 2012 by Jesse Sans,  RN Outcome: Progressing 06/24/2019 0831 by Rosana Fret, RN Outcome: Progressing   Problem: Respiratory: Goal: Ability to maintain a clear airway will improve 06/24/2019 2012 by Rosana Fret, RN Outcome: Progressing 06/24/2019 0831 by Rosana Fret, RN Outcome: Progressing Goal: Levels of oxygenation will improve 06/24/2019 2012 by Rosana Fret, RN Outcome: Progressing 06/24/2019 0831 by Rosana Fret, RN Outcome: Progressing Goal: Ability to maintain adequate ventilation will improve 06/24/2019 2012 by Rosana Fret, RN Outcome: Progressing 06/24/2019 0831 by Rosana Fret, RN Outcome: Progressing

## 2019-06-25 ENCOUNTER — Inpatient Hospital Stay: Payer: Medicaid Other

## 2019-06-25 ENCOUNTER — Encounter: Payer: Self-pay | Admitting: Pulmonary Disease

## 2019-06-25 LAB — CBC WITH DIFFERENTIAL/PLATELET
Abs Immature Granulocytes: 0.17 10*3/uL — ABNORMAL HIGH (ref 0.00–0.07)
Basophils Absolute: 0 10*3/uL (ref 0.0–0.1)
Basophils Relative: 0 %
Eosinophils Absolute: 0 10*3/uL (ref 0.0–0.5)
Eosinophils Relative: 0 %
HCT: 27.1 % — ABNORMAL LOW (ref 39.0–52.0)
Hemoglobin: 9 g/dL — ABNORMAL LOW (ref 13.0–17.0)
Immature Granulocytes: 1 %
Lymphocytes Relative: 6 %
Lymphs Abs: 1.1 10*3/uL (ref 0.7–4.0)
MCH: 31.6 pg (ref 26.0–34.0)
MCHC: 33.2 g/dL (ref 30.0–36.0)
MCV: 95.1 fL (ref 80.0–100.0)
Monocytes Absolute: 1.6 10*3/uL — ABNORMAL HIGH (ref 0.1–1.0)
Monocytes Relative: 9 %
Neutro Abs: 14.6 10*3/uL — ABNORMAL HIGH (ref 1.7–7.7)
Neutrophils Relative %: 84 %
Platelets: 158 10*3/uL (ref 150–400)
RBC: 2.85 MIL/uL — ABNORMAL LOW (ref 4.22–5.81)
RDW: 13 % (ref 11.5–15.5)
WBC: 17.5 10*3/uL — ABNORMAL HIGH (ref 4.0–10.5)
nRBC: 0 % (ref 0.0–0.2)

## 2019-06-25 LAB — PHOSPHORUS: Phosphorus: 3 mg/dL (ref 2.5–4.6)

## 2019-06-25 LAB — BASIC METABOLIC PANEL
Anion gap: 7 (ref 5–15)
BUN: 9 mg/dL (ref 6–20)
CO2: 34 mmol/L — ABNORMAL HIGH (ref 22–32)
Calcium: 8.2 mg/dL — ABNORMAL LOW (ref 8.9–10.3)
Chloride: 91 mmol/L — ABNORMAL LOW (ref 98–111)
Creatinine, Ser: 0.55 mg/dL — ABNORMAL LOW (ref 0.61–1.24)
GFR calc Af Amer: >60 mL/min (ref >60)
GFR calc non Af Amer: >60 mL/min (ref >60)
Glucose, Bld: 369 mg/dL — ABNORMAL HIGH (ref 70–99)
Potassium: 3.3 mmol/L — ABNORMAL LOW (ref 3.5–5.1)
Sodium: 132 mmol/L — ABNORMAL LOW (ref 135–145)

## 2019-06-25 LAB — GLUCOSE, CAPILLARY
Glucose-Capillary: 101 mg/dL — ABNORMAL HIGH (ref 70–99)
Glucose-Capillary: 100 mg/dL — ABNORMAL HIGH (ref 70–99)
Glucose-Capillary: 126 mg/dL — ABNORMAL HIGH (ref 70–99)
Glucose-Capillary: 81 mg/dL (ref 70–99)
Glucose-Capillary: 102 mg/dL — ABNORMAL HIGH (ref 70–99)

## 2019-06-25 LAB — MAGNESIUM: Magnesium: 1.6 mg/dL — ABNORMAL LOW (ref 1.7–2.4)

## 2019-06-25 MED ORDER — LORAZEPAM 2 MG/ML IJ SOLN
1.0000 mg | INTRAMUSCULAR | Status: DC | PRN
  Administered 2019-06-26 – 2019-07-28 (×38): 1 mg via INTRAVENOUS
  Filled 2019-06-25 (×41): qty 1

## 2019-06-25 MED ORDER — MAGNESIUM SULFATE 2 GM/50ML IV SOLN
2.0000 g | Freq: Once | INTRAVENOUS | Status: AC
  Administered 2019-06-25: 2 g via INTRAVENOUS
  Filled 2019-06-25: qty 50

## 2019-06-25 MED ORDER — POTASSIUM CHLORIDE 20 MEQ PO PACK: 40.0000 meq | PACK | Freq: Once | ORAL | Status: DC

## 2019-06-25 MED ORDER — IOHEXOL 300 MG/ML  SOLN
75.0000 mL | Freq: Once | INTRAMUSCULAR | Status: AC | PRN
  Administered 2019-06-25: 100 mL via INTRAVENOUS

## 2019-06-25 MED ORDER — POTASSIUM CHLORIDE 10 MEQ/50ML IV SOLN
10.0000 meq | INTRAVENOUS | Status: AC
  Administered 2019-06-25 (×2): 10 meq via INTRAVENOUS
  Filled 2019-06-25 (×2): qty 50

## 2019-06-25 MED ORDER — OSMOLITE 1.5 CAL PO LIQD
1000.0000 mL | ORAL | Status: DC
  Administered 2019-06-25 – 2019-07-05 (×10): 1000 mL

## 2019-06-25 MED ORDER — FREE WATER
30.0000 mL | Status: DC
  Administered 2019-06-26 – 2019-06-30 (×17): 30 mL

## 2019-06-25 MED ORDER — ENOXAPARIN SODIUM 30 MG/0.3ML ~~LOC~~ SOLN
30.0000 mg | SUBCUTANEOUS | Status: DC
  Administered 2019-06-26: 10:00:00 30 mg via SUBCUTANEOUS
  Filled 2019-06-25: qty 0.3

## 2019-06-25 NOTE — Progress Notes (Signed)
Pt w some difficulty tolerating feeds. THis is not a new problem and he had this issue before. I did obtain a CT A/P w contrast to make sure the tube was positioned appropriately and there was no other acute issues. Ct pers. Reviewed showing G tube well positioned, ,  No evidence of leaks and expected small pneumoperitoneum. D/W RN and RD. May hold TF for today and may start trophic feeds in am. 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 He has had fever and increased wbc during hospitalization and has not changed and that is why I ordered the CT to make sure that it was not related to a complications from the G tube

## 2019-06-25 NOTE — Progress Notes (Signed)
[facility] SURGICAL PROGRESS NOTE  Hospital Day(s): 10.   Post op day(s): 1 Day Post-Op.   Interval History: Patient seen and examined, no acute events or new complaints overnight. Patient reports incisional soreness. Minimal drainage from gastrostomy tube.    Vital signs in last 24 hours: [min-max] current  Temp:  [99 F (37.2 C)-101.6 F (38.7 C)] 99 F (37.2 C) (04/23 0400) Pulse Rate:  [81-119] 81 (04/23 0500) Resp:  [16-23] 18 (04/23 0500) BP: (111-148)/(79-100) 117/85 (04/23 0500) SpO2:  [95 %-100 %] 100 % (04/23 0500) FiO2 (%):  [28 %-30 %] 28 % (04/23 0400)     Height: 5\' 6"  (167.6 cm) Weight: 57.7 kg BMI (Calculated): 20.54   Intake/Output last 2 shifts:  04/22 0701 - 04/23 0700 In: 2512.5 [I.V.:1822.5; IV Piggyback:600] Out: 810 [Urine:600; Emesis/NG output:200; Blood:10]   Physical Exam:  Constitutional: alert, cooperative and no distress  Respiratory: Tracheostomy Gastrointestinal: Soft, incisional soreness, and non-distended, no rebound/guarding, gastrostomy in LUQ, draining to gravity  Labs:  CBC Latest Ref Rng & Units 06/25/2019 06/24/2019 06/23/2019  WBC 4.0 - 10.5 K/uL 17.5(H) 17.0(H) 8.8  Hemoglobin 13.0 - 17.0 g/dL 9.0(L) 9.7(L) 9.5(L)  Hematocrit 39.0 - 52.0 % 27.1(L) 30.8(L) 31.0(L)  Platelets 150 - 400 K/uL 158 153 123(L)   CMP Latest Ref Rng & Units 06/25/2019 06/24/2019 06/23/2019  Glucose 70 - 99 mg/dL 06/25/2019) 094(M) 768(G)  BUN 6 - 20 mg/dL 9 10 11   Creatinine 0.61 - 1.24 mg/dL 881(J) ) 0.31(R)  Sodium 135 - 145 mmol/L 132(L) 139 138  Potassium 3.5 - 5.1 mmol/L 3.3(L) 3.2(L) 3.3(L)  Chloride 98 - 111 mmol/L 91(L) 95(L) 97(L)  CO2 22 - 32 mmol/L 34(H) 33(H) 35(H)  Calcium 8.9 - 10.3 mg/dL 8.2(L) 8.4(L) 8.5(L)  Total Protein 6.5 - 8.1 g/dL - - -  Total Bilirubin 0.3 - 1.2 mg/dL - - -  Alkaline Phos 38 - 126 U/L - - -  AST 15 - 41 U/L - - -  ALT 0 - 44 U/L - - -    Imaging studies: No new pertinent imaging  studies   Assessment/Plan:  49 y.o. male 1 Day Post-Op s/p robotic assisted laparoscopic gastrostomy tube placement for malnutrition, complicated by pertinent comorbidities including debilitation.   - Okay to start trickle feeds; advance per dietician   - No further surgical issues  All of the above findings and recommendations were discussed with the patient, and the medical team  -- 5.92(T, PA-C [facility] 06/25/2019, 7:39 AM [phone] M-F: 7am - 4pm

## 2019-06-25 NOTE — Progress Notes (Signed)
CRITICAL CARE NOTE  49 year old former smoker with a history very severe COPD stage IV, who presented via EMS after being found down and unresponsive.  Apparently the patient's mother placed a call to EMS.  The patient has a longstanding history of COPD and noncompliance with his oxygen and CPAP.  History could not be obtained from the patient as there was no family available and the patient had been intubated and was mechanically ventilated in the ED.  Of note he had recently been admitted to [facility] 29 May 2019 through 03 June 2019.  He had been evaluated by palliative care during this admission and actually had a DNR order.   Patient also had a 2D echo  with pulmonary hypertension consistent with cor pulmonale.  Records review: Patient has been following with Dr. Ned Cl[facility] at [facility].  Last known PFTs 11 September 2017.  FEV1 0.52 L or 20% predicted, FVC 1.49 L or 45% of predicted, FEV1/FVC 35%.  FEF 25-75 6%.  DLCO was 33%.  This is consistent with very severe COPD.  Events: 4/13 admitted for severe resp failure from COPD 4/14 remains critically ill on vent 4/15 remains critically ill, severe COPD, failed SAT/SBT 4/16 extubated yesterday, had to be reintubated remains on ventilator 4/17 sedated on the ventilator 4/18 abdominal distention, CT abdomen and pelvis consistent with ileus and constipation 4/19-patietn remains on ventialtor awainting trach 4/20 - patient is on vent with trach, he had episodes of aggitation today but eventually improved  4/21 - patient with significant distension of abdomen, concern for aerophagia in NIV.  There is mucopurulent exudation from trache this was cultures and patient placed on Unasyn for tracheitis 06/24/19 -trache site improved with less malodorous pus draining, NGT with decompression worked well abdomen is less distended. S/p PEG 06/25/19- patient had some anxiety throughout day today, we have started low dose anxiolytic. His PEG is now being  utilized. He has some placement issues with social work due to insurance and we are working on this now.   CC  Severe COPD Severe resp failure     BP (!) 144/102   Pulse (!) 105   Temp 98.8 F (37.1 C) (Oral)   Resp 19   Ht 5\' 6"  (1.676 m)   Wt 57.7 kg   SpO2 95%   BMI 20.53 kg/m    I/O last 3 completed shifts: In: 3334 [I.V.:2370.2; Other:90; IV Piggyback:873.8] Out: 2210 [Urine:1050; Emesis/NG output:1150; Blood:10] Total I/O In: 784.8 [I.V.:504.8; IV Piggyback:280] Out: 300 [Drains:300]  SpO2: 95 % O2 Flow Rate (L/min): 28 L/min FiO2 (%): 28 %  Estimated body mass index is 20.53 kg/m as calculated from the following:   Height as of this encounter: 5\' 6"  (1.676 m).   Weight as of this encounter: 57.7 kg.  REVIEW OF SYSTEMS  10 point ROS done and is neg except as per subjective findings  PHYSICAL EXAMINATION: Physical Exam  Constitutional: No distress.  HENT:  Head: Normocephalic and atraumatic.    Cardiovascular: Regular rhythm.  Pulmonary/Chest: Breath sounds normal. No accessory muscle usage. Tachypnea noted. He is in respiratory distress.  Abdominal: Bowel sounds are normal. He exhibits no distension, no fluid wave and no ascites. There is no abdominal tenderness.  Neurological: He is alert.       MEDICATIONS: I have reviewed all medications and confirmed regimen as documented   CULTURE RESULTS   Recent Results (from the past 240 hour(s))  Aerobic Culture (superficial specimen)     Status: None (Preliminary  result)   Collection Time: 06/23/19  4:55 PM   Specimen: Neck  Result Value Ref Range Status   Specimen Description   Final    NECK Performed at [facility], [address] [place], [place] [address]    Special Requests   Final    NONE Performed at [facility], [address] [place], [place] [address]    Gram Stain   Final    NO WBC SEEN ABUNDANT GRAM POSITIVE COCCI ABUNDANT GRAM NEGATIVE RODS ABUNDANT  GRAM POSITIVE RODS    Culture   Final    CULTURE REINCUBATED FOR BETTER GROWTH Performed at [facility], [address], [place], [place] [address]    Report Status PENDING  Incomplete        BMP Latest Ref Rng & Units 06/25/2019 06/24/2019 06/23/2019  Glucose 70 - 99 mg/dL 235(T) 614(E) 315(Q)  BUN 6 - 20 mg/dL 9 10 11   Creatinine 0.61 - 1.24 mg/dL ) 0.08(Q) 7.61(P)  Sodium 135 - 145 mmol/L 132(L) 139 138  Potassium 3.5 - 5.1 mmol/L 3.3(L) 3.2(L) 3.3(L)  Chloride 98 - 111 mmol/L 91(L) 95(L) 97(L)  CO2 22 - 32 mmol/L 34(H) 33(H) 35(H)  Calcium 8.9 - 10.3 mg/dL 8.2(L) 8.4(L) 8.5(L)         Indwelling Urinary Catheter continued, requirement due to   Reason to continue Indwelling Urinary Catheter strict Intake/Output monitoring for hemodynamic instability         Ventilator continued, requirement due to severe respiratory failure   Ventilator Sedation RASS 0 to -2      ASSESSMENT AND PLAN   Acute on chronic hypercarbic/hypoxic respiratory failure COPD exacerbation s/ptracheostomy - there is mucopurulent exudation concerning for  Bronchodilators: DuoNeb every 4 and as needed IV corticosteroids There continues to be no indication for antibiotics at present  Severe COPD stage IV  Noted noncompliance with oxygen/BiPAP at home -continue LAMA/LABA/ICS   Tracheitis associated with tracheostomy -S/p culture of mucopurulent exudate -preliminary microbiology with - mixed species full report in progress -s/p Unasyn with significant improvement in discharge and malodorous smell - continue with current antimicrobial    Acute encephalopathy Unresponsiveness Due to hypercarbia Has resolved   Hypoglycemia Due to poor nutritional status Continue tube feeds Add D10 ICU hypoglycemia protocol Poor prognostic sign  Acute kidney injury  Avoid nephrotoxins Stable with no worsening Trend renal panel  Hyperkalemia Likely triggered by  acidosis RESOLVED  ID Procalcitonin normal No infiltrate on chest x-ray No indication for antibiotics at present Continue to trend WBC/fever curve Had 1 single blood culture of MRSA likely contaminant [name] MRSA PCR negative  Abdominal distention Ileus CT abdomen and pelvis: No obstruction: No free air Findings likely due to ileus, constipation Bowel regimen  Prophylaxis: Lovenox Famotidine    Critical care provider statement:    Critical care time (minutes):  35   Critical care time was exclusive of:  Separately billable procedures and  treating other patients   Critical care was necessary to treat or prevent imminent or  life-threatening deterioration of the following conditions:  Acute hypercapnic respiratory failure requiring tracheostomy   Critical care was time spent personally by me on the following  activities:  Development of treatment plan with patient or surrogate,  discussions with consultants, evaluation of patient's response to  treatment, examination of patient, obtaining history from patient or  surrogate, ordering and performing treatments and interventions, ordering  and review of laboratory studies and re-evaluation of patient's condition  I assumed direction of critical care for this patient from another  provider in my specialty: no       Ottie Glazier, M.D.  [facility] [place]

## 2019-06-25 NOTE — Progress Notes (Addendum)
PT is recommending LTACH. Confirmed with LTACH Representative that they cannot accept Medicaid patient.   Patient currently on vent with trach.  Updated MD. Patient cannot go to SNF on Trilogy. Patient had Trilogy prior to this admission.  Reached out to [facility] Leadership about other options for when patient is ready for discharge.  CSW will continue to follow.  Alfonso Ramus, [place] [phone]

## 2019-06-25 NOTE — Progress Notes (Signed)
Pt transported to CT and back to CCU without complications.

## 2019-06-25 NOTE — Plan of Care (Signed)
Pt continued on full vent support, pt did not sleep at all , awake all night. Pt is A?O and follows command, he node and appropriate gestures to questions. Voiding per urinal.able to communicate needs by mouthing words, if someone can read lips will understand him easily.

## 2019-06-25 NOTE — Evaluation (Signed)
Occupational Therapy Evaluation Patient Details Name: Chris Case MRN: [id number] DOB: 21-Nov-1970 Today's Date: 06/25/2019    History of Present Illness Chris Case is a 26yoM who comes to [facility] on 4/13 after being found unresponsive. Pt here 29 May 2019 through 03 June 2019. Pt required ventilation upon arrival, failed extubation on 4/16, transitioned to tach on 4/20. Pt has required NGT decompression d/t ABD distension, underwent  PEG placement and lysis adhesions 4/22. 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   Clinical Impression   Pt was seen for OT evaluation this date. Prior to hospital admission, pt was on 3L O2 chronically and per chart had some oxygen adherence difficulty. Pt lives with friends in a 1 story home with 3 steps and R rail. Pt nods and points to abdomen and trach site when asked about pain. NA and RT in room to assist pt with return to bed upon OT's arrival. With NA and RT assisting with lines/leads/ventilator for safety, pt required Min-Mod A for STS transfer and SPT to EOB with near LOB requiring Min A from therapist to correct. Pt attempting to adjust his gown when it occurred requiring cues for safety and focus on task at hand to improve safety. Mildly impulsive, attempting to stand prior to therapist and staff having fully prepared lines/leads. Currently pt demonstrates impairments as described below (See OT problem list) which functionally limit his ability to perform ADL/self-care tasks and ADL mobility. Pt currently requires mod-max assist for bathing and dressing tasks. Min-Mod A for toilet transfers to [facility]. Once back in bed, pt preparing to be taken for CT scan. Further treatment held for next session. Pt would benefit from skilled OT to address noted impairments and functional limitations (see below for any additional details) in order to maximize safety and independence while minimizing falls risk and caregiver  burden. Upon hospital discharge, recommend STR vs LTACH to maximize pt safety and return to PLOF.     Follow Up Recommendations  LTACH;SNF    Equipment Recommendations  3 in 1 bedside commode    Recommendations for Other Services       Precautions / Restrictions Precautions Precautions: Fall;Other (comment) Precaution Comments: trach, vent Restrictions Weight Bearing Restrictions: No      Mobility Bed Mobility Overal bed mobility: Needs Assistance Bed Mobility: Sit to Supine       Sit to supine: Min assist   General bed mobility comments: pt seated at EOB upon entry.  Transfers Overall transfer level: Needs assistance Equipment used: 1 person hand held assist(2nd person for lines/leads and RT attending to ventilator) Transfers: Stand Pivot Transfers;Sit to/from Stand Sit to Stand: Min assist;Mod assist Stand pivot transfers: Min assist;Mod assist       General transfer comment: mild LOB requiring assist to correct, NA and RT assisting with lines/leads for safety    Balance Overall balance assessment: Needs assistance Sitting-balance support: No upper extremity supported;Feet supported Sitting balance-Leahy Scale: Good     Standing balance support: Single extremity supported Standing balance-Leahy Scale: Poor Standing balance comment: poor to fair- with slight LOB requiring assist to correct                           ADL either performed or assessed with clinical judgement   ADL Overall ADL's : Needs assistance/impaired  General ADL Comments: Currently NPO but able to bring hands to mouth independently in preparation for self feeding, set up grooming, Min-Mod A for BSC transfer, Mod A for LB ADL, Mod-Max A for UB dressing 2/2 lines/leads/trach/ventilator     Vision Baseline Vision/History: No visual deficits Patient Visual Report: No change from baseline       Perception     Praxis       Pertinent Vitals/Pain Pain Assessment: (pt nods when asked if he had pain and indicates his abdomen hurts and has trach site soreness)     Hand Dominance Right   Extremity/Trunk Assessment Upper Extremity Assessment Upper Extremity Assessment: Generalized weakness;Overall [facility] for tasks assessed   Lower Extremity Assessment Lower Extremity Assessment: Overall WFL for tasks assessed;Generalized weakness   Cervical / Trunk Assessment Cervical / Trunk Assessment: Normal   Communication Communication Communication: Tracheostomy   Cognition Arousal/Alertness: Awake/alert Behavior During Therapy: WFL for tasks assessed/performed Overall Cognitive Status: Within Functional Limits for tasks assessed                                 General Comments: mildly impulsive with standing and requires cues for safety   General Comments       Exercises General Exercises - Lower Extremity Long Arc Quad: AROM;Both;15 reps;Seated Hip Flexion/Marching: AROM;Seated;Both;15 reps Other Exercises Other Exercises: seated hip extension from end-range flexion to floor, author resists foot. 1x15 bilat   Shoulder Instructions      Home Living Family/patient expects to be discharged to:: Private residence Living Arrangements: Non-relatives/Friends Available Help at Discharge: Friend(s);Available PRN/intermittently Type of Home: House Home Access: Stairs to enter [employer] of Steps: 3 Entrance Stairs-Rails: Right Home Layout: One level     Bathroom Shower/Tub: Chief Strategy Officer: Standard     Home Equipment: Grab bars - tub/shower;Shower seat;Cane - single point          Prior Functioning/Environment Level of Independence: Independent        Comments: Uses 3 L home O2 at baseline, poor compliance per EMR.        OT Problem List: Cardiopulmonary status limiting activity;Pain;Decreased strength;Decreased safety awareness;Decreased activity  tolerance;Impaired balance (sitting and/or standing);Decreased knowledge of use of DME or AE      OT Treatment/Interventions: Self-care/ADL training;Therapeutic exercise;Therapeutic activities;DME and/or AE instruction;Patient/family education;Balance training    OT Goals(Current goals can be found in the care plan section) Acute Rehab OT Goals Patient Stated Goal: be independent again OT Goal Formulation: With patient Time For Goal Achievement: 07/09/19 Potential to Achieve Goals: Good ADL Goals Pt Will Perform Upper Body Dressing: with mod assist;sitting;with min assist(assist for trach/ventilator/equipment) Pt Will Perform Lower Body Dressing: with min assist;sit to/from stand Pt Will Transfer to Toilet: with min assist;bedside commode;ambulating  OT Frequency: Min 2X/week   Barriers to D/C:            Co-evaluation              AM-PAC OT "6 Clicks" Daily Activity     Outcome Measure Help from another person eating meals?: None Help from another person taking care of personal grooming?: None Help from another person toileting, which includes using toliet, bedpan, or urinal?: A Lot Help from another person bathing (including washing, rinsing, drying)?: A Lot Help from another person to put on and taking off regular upper body clothing?: A Lot Help from another person to put on and taking  off regular lower body clothing?: A Lot 6 Click Score: 16   End of Session Equipment Utilized During Treatment: Gait belt;Oxygen  Activity Tolerance: Patient tolerated treatment well Patient left: in bed;with call bell/phone within reach;with nursing/sitter in room  OT Visit Diagnosis: Other abnormalities of gait and mobility (R26.89);Muscle weakness (generalized) (M62.81);Pain Pain - part of body: (abdominal pain and trach site pain)                Time: 6606-0045 OT Time Calculation (min): 11 min Charges:  OT General Charges $OT Visit: 1 Visit OT Evaluation $OT Eval Moderate  Complexity: 1 Mod  Richrd Prime, MPH, MS, OTR/L ascom [phone] 06/25/19, 4:33 PM

## 2019-06-25 NOTE — Evaluation (Signed)
Physical Therapy Evaluation Patient Details Name: Chris Case MRN: [id number] DOB: [birth year] Today's Date: 06/25/2019   History of Present Illness  Chris Case is a 73yoM who comes to [facility] on 4/13 after being found unresponsive. Pt here 29 May 2019 through 03 June 2019. Pt required ventilation upon arrival, failed extubation on 4/16, transitioned to tach on 4/20. Pt has required NGT decompression d/t ABD distension, underwent  PEG placement and lysis adhesions 4/22. 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  Clinical Impression  Pt admitted with above diagnosis. Pt currently with functional limitations due to the deficits listed below (see "PT Problem List"). Upon entry, pt seated EOB, awake and agreeable to participate. Pt is preparing to transfer to recliner with RN and NA. The pt is alert, seems mildly anxious at times, but is pleasant. Author fails at multiple attempts to received lipped expressive language, but NA in room is able to receive and relay these messages. In recliner, pt partakes in leg exercises, HR remains in 110s to 120s (appears to have more DOE when HR is in 120s). O2 sats remain in mid 90s% on 28% FiO2, but does drop to upper 80s% with transfer. Functional mobility assessment demonstrates increased effort/time requirements, poor tolerance, and need for physical assistance, whereas the patient performed these at a higher level of independence PTA. Pt will benefit from skilled PT intervention to increase independence and safety with basic mobility in preparation for discharge to the venue listed below.       Follow Up Recommendations LTACH;Supervision for mobility/OOB;Supervision/Assistance - 24 hour    Equipment Recommendations  None recommended by PT    Recommendations for Other Services       Precautions / Restrictions Precautions Precautions: Fall Restrictions Weight Bearing Restrictions: No      Mobility  Bed  Mobility               General bed mobility comments: pt seated at EOB upon entry.  Transfers Overall transfer level: Needs assistance Equipment used: 1 person hand held assist Transfers: Stand Pivot Transfers;Sit to/from Stand Sit to Stand: Mod assist Stand pivot transfers: Min assist       General transfer comment: weakness of legs, but does fine with min-mod stability, does well following cues, albeit seems mildly impulsive. Pt on vent via tach, NA and RN helping with lines/leads for safety.  Ambulation/Gait Ambulation/Gait assistance: (deferred, motivated, but lacks insight into condition at present.)              Stairs            Wheelchair Mobility    Modified Rankin (Stroke Patients Only)       Balance                                             Pertinent Vitals/Pain      Home Living Family/patient expects to be discharged to:: Private residence Living Arrangements: Non-relatives/Friends Available Help at Discharge: Friend(s);Available PRN/intermittently Type of Home: House Home Access: Stairs to enter Entrance Stairs-Rails: Right Entrance Stairs-Number of Steps: 3 Home Layout: One level Home Equipment: Grab bars - tub/shower;Shower seat;Cane - single point      Prior Function Level of Independence: Independent         Comments: Uses 3 L home O2 at baseline, poor compliance per EMR.  Hand Dominance   Dominant Hand: Right    Extremity/Trunk Assessment   Upper Extremity Assessment Upper Extremity Assessment: Generalized weakness;Overall [facility] for tasks assessed    Lower Extremity Assessment Lower Extremity Assessment: Generalized weakness;Overall [facility] for tasks assessed    Cervical / Trunk Assessment Cervical / Trunk Assessment: Normal  Communication   Communication: Tracheostomy  Cognition Arousal/Alertness: Awake/alert Behavior During Therapy: WFL for tasks assessed/performed Overall Cognitive  Status: Within Functional Limits for tasks assessed                                        General Comments      Exercises General Exercises - Lower Extremity Long Arc Quad: AROM;Both;15 reps;Seated Hip Flexion/Marching: AROM;Seated;Both;15 reps Other Exercises Other Exercises: seated hip extension from end-range flexion to floor, author resists foot. 1x15 bilat   Assessment/Plan    PT Assessment Patient needs continued PT services  PT Problem List Decreased strength;Decreased mobility;Decreased activity tolerance;Decreased balance;Decreased cognition;Decreased knowledge of precautions;Decreased safety awareness       PT Treatment Interventions DME instruction;Gait training;Stair training;Functional mobility training;Therapeutic activities;Therapeutic exercise;Balance training;Patient/family education    PT Goals (Current goals can be found in the Care Plan section)  Acute Rehab PT Goals Patient Stated Goal: pt wants to resume AMB PT Goal Formulation: With patient Time For Goal Achievement: 07/09/19 Potential to Achieve Goals: Poor    Frequency Min 2X/week   Barriers to discharge        Co-evaluation               AM-PAC PT "6 Clicks" Mobility  Outcome Measure Help needed turning from your back to your side while in a flat bed without using bedrails?: A Little Help needed moving from lying on your back to sitting on the side of a flat bed without using bedrails?: A Little Help needed moving to and from a bed to a chair (including a wheelchair)?: A Lot Help needed standing up from a chair using your arms (e.g., wheelchair or bedside chair)?: A Lot Help needed to walk in hospital room?: Total Help needed climbing 3-5 steps with a railing? : Total 6 Click Score: 12    End of Session Equipment Utilized During Treatment: Oxygen Activity Tolerance: Treatment limited secondary to medical complications (Comment)(vitals adequately challenged with chair  level activity, not appropriate for AMB.) Patient left: with call bell/phone within reach;in chair;with chair alarm set Nurse Communication: Mobility status;Precautions;Other (comment) PT Visit Diagnosis: Unsteadiness on feet (R26.81);Muscle weakness (generalized) (M62.81);History of falling (Z91.81);Other abnormalities of gait and mobility (R26.89)    Time: 1025-8527 PT Time Calculation (min) (ACUTE ONLY): 27 min   Charges:   PT Evaluation $PT Eval High Complexity: 1 High PT Treatments $Therapeutic Exercise: 8-22 mins        1:07 PM, 06/25/19 Rosamaria Lints, PT, DPT Physical Therapist - [facility]  [phone] (ASCOM)   Tejuan Gholson C 06/25/2019, 1:00 PM

## 2019-06-25 NOTE — Progress Notes (Addendum)
PHARMACY CONSULT NOTE  Pharmacy Consult for Electrolyte Monitoring and Replacement   Recent Labs: Potassium (mmol/L)  Date Value  06/25/2019 3.3 (L)  09/24/2013 4.6   Magnesium (mg/dL)  Date Value  29/84/7308 1.6 (L)   Calcium (mg/dL)  Date Value  56/94/3700 8.2 (L)   Calcium, Total (mg/dL)  Date Value  52/59/1028 8.9   Albumin (g/dL)  Date Value  90/22/8406 3.5   Phosphorus (mg/dL)  Date Value  98/61/4830 3.0   Sodium (mmol/L)  Date Value  06/25/2019 132 (L)  09/24/2013 138     Assessment: 49 year old male admitted after being found unresponsive. Patient was intubated in the ED and transferred to the ICU. Patient started on tube feeds and at refeeding risk.  Patient extubated 4/15 am and placed on BiPAP. Patient required reintubation.   Trach 4/20, G tube placement 4/22.  Goal of Therapy:  Electrolytes WNL  Plan:  Potassium 40 mEq per tube and magnesium 2 g IV x 1. Labs have been ordered daily. Will follow and replace as indicated.  ADDENDUM 4/23 pm: Patient is not tolerating feeds at this time. Will change potassium replacement to IV and give 2 runs for now. F/u in am.  Pricilla Riffle, PharmD Clinical Pharmacist 06/25/2019 12:21 PM

## 2019-06-25 NOTE — Progress Notes (Signed)
Nutrition Follow-up  DOCUMENTATION CODES:   Severe malnutrition in context of chronic illness  INTERVENTION:  Initiate Osmolite 1.5 Cal at 20 mL/hr and advance by 20 mL/hr every 24 hours to goal rate of 60 mL/hr (1440 mL goal daily volume). Provides 2160 kcal, 90 grams of protein, 1094 mL H2O daily.  Goal TF regimen meets 100% RDIs for vitamins/minerals.  Provide minimum free water flush of 30 mL Q4hrs to maintain tube patency.  Once plan is to meet all hydration needs enterally per free water flush recommend a free water flush of 120 mL Q6hrs. Provides a total of 1574 mL H2O daily including water in tube feeding regimen.  Monitor magnesium, potassium, and phosphorus daily for at least 3 days, MD to replete as needed, as pt is at risk for refeeding syndrome.  NUTRITION DIAGNOSIS:   Severe Malnutrition related to chronic illness(COPD) as evidenced by severe fat depletion, severe muscle depletion.  Ongoing.  GOAL:   Patient will meet greater than or equal to 90% of their needs  Progressing.  MONITOR:   Labs, Weight trends, Vent status, TF tolerance, I & O's  REASON FOR ASSESSMENT:   Ventilator    ASSESSMENT:   49 year old male with PMHx of emphysema, COPD, hx TB admitted with acute COPD exacerbation requiring intubation.  -Patient s/p tracheostomy tube placement on 4/20. -Patient s/p robotic assisted laparoscopic G-tube placement with extensive lysis of adhesions on 4/22.  Met with patient at bedside. Patient is off all continuous sedation. He is being switched from Pressure Support to Pressure Control today on ventilator. Patient alert and able to answer questions by shaking head yes or no and mouthing words. [name] abdomen is distended. He ddi have a documented BM yesterday. Patient previously had difficulty tolerating tube feeds per NGT in setting of ileus. Plan is to start feeds slowly per G-tube today.  Patient is currently on ventilator support through trach MV:  6 L/min Temp (24hrs), Avg:100.5 F (38.1 C), Min:99 F (37.2 C), Max:101.6 F (38.7 C)  Propofol: N/A  Medications reviewed and include: Solu-Medrol 20 mg Q12hrs IV, MVI daily, Miralax, Unasyn, D10 at 50 mL/hr, famotidine, magnesium sulfate 2 grams IV once today.  Labs reviewed: CBG 97-120, Sodium 132, Potassium 3.3, Chloride 91, CO2 34, Creatinine 0.55, Magnesium 1.6.  Enteral Access: G-tube  I/O: 600 mL UOP yesterday (0.4 mL/kg/hr)  Weight trend: 47.7 kg on 4/22; +3 kg from 4/14 likely related to volume status (pt with mild pitting edema)  Diet Order:   Diet Order            Diet NPO time specified  Diet effective midnight             EDUCATION NEEDS:   No education needs have been identified at this time  Skin:  Skin Assessment: Skin Integrity Issues:(skin tear to sacrum; closed incisions to neck and abdomen)  Last BM:  06/24/2019 - medium type 7  Height:   Ht Readings from Last 1 Encounters:  06/15/19 '5\' 6"'  (1.676 m)   Weight:   Wt Readings from Last 1 Encounters:  06/24/19 57.7 kg   Ideal Body Weight:  64.5 kg  BMI:  Body mass index is 20.53 kg/m.  Estimated Nutritional Needs:   Kcal:  1900-2100  Protein:  85-100 grams  Fluid:  1.6-1.9 L/day  Jacklynn Barnacle, MS, RD, LDN Pager number available on Amion

## 2019-06-26 LAB — GLUCOSE, CAPILLARY
Glucose-Capillary: 105 mg/dL — ABNORMAL HIGH (ref 70–99)
Glucose-Capillary: 115 mg/dL — ABNORMAL HIGH (ref 70–99)
Glucose-Capillary: 116 mg/dL — ABNORMAL HIGH (ref 70–99)
Glucose-Capillary: 88 mg/dL (ref 70–99)
Glucose-Capillary: 96 mg/dL (ref 70–99)
Glucose-Capillary: 99 mg/dL (ref 70–99)
Glucose-Capillary: 99 mg/dL (ref 70–99)

## 2019-06-26 LAB — CBC WITH DIFFERENTIAL/PLATELET
Abs Immature Granulocytes: 0.15 10*3/uL — ABNORMAL HIGH (ref 0.00–0.07)
Basophils Absolute: 0 10*3/uL (ref 0.0–0.1)
Basophils Relative: 0 %
Eosinophils Absolute: 0 10*3/uL (ref 0.0–0.5)
Eosinophils Relative: 0 %
HCT: 29.1 % — ABNORMAL LOW (ref 39.0–52.0)
Hemoglobin: 9.1 g/dL — ABNORMAL LOW (ref 13.0–17.0)
Immature Granulocytes: 1 %
Lymphocytes Relative: 7 %
Lymphs Abs: 1.2 10*3/uL (ref 0.7–4.0)
MCH: 30.5 pg (ref 26.0–34.0)
MCHC: 31.3 g/dL (ref 30.0–36.0)
MCV: 97.7 fL (ref 80.0–100.0)
Monocytes Absolute: 1.4 10*3/uL — ABNORMAL HIGH (ref 0.1–1.0)
Monocytes Relative: 8 %
Neutro Abs: 14.3 10*3/uL — ABNORMAL HIGH (ref 1.7–7.7)
Neutrophils Relative %: 84 %
Platelets: 183 10*3/uL (ref 150–400)
RBC: 2.98 MIL/uL — ABNORMAL LOW (ref 4.22–5.81)
RDW: 12.8 % (ref 11.5–15.5)
WBC: 17.1 10*3/uL — ABNORMAL HIGH (ref 4.0–10.5)
nRBC: 0 % (ref 0.0–0.2)

## 2019-06-26 LAB — BASIC METABOLIC PANEL
Anion gap: 10 (ref 5–15)
BUN: 10 mg/dL (ref 6–20)
CO2: 32 mmol/L (ref 22–32)
Calcium: 8.6 mg/dL — ABNORMAL LOW (ref 8.9–10.3)
Chloride: 91 mmol/L — ABNORMAL LOW (ref 98–111)
Creatinine, Ser: 0.54 mg/dL — ABNORMAL LOW (ref 0.61–1.24)
GFR calc Af Amer: >60 mL/min (ref >60)
GFR calc non Af Amer: >60 mL/min (ref >60)
Glucose, Bld: 92 mg/dL (ref 70–99)
Potassium: 3.4 mmol/L — ABNORMAL LOW (ref 3.5–5.1)
Sodium: 133 mmol/L — ABNORMAL LOW (ref 135–145)

## 2019-06-26 LAB — MAGNESIUM: Magnesium: 2 mg/dL (ref 1.7–2.4)

## 2019-06-26 LAB — PHOSPHORUS: Phosphorus: 3.7 mg/dL (ref 2.5–4.6)

## 2019-06-26 LAB — AEROBIC CULTURE W GRAM STAIN (SUPERFICIAL SPECIMEN)
Culture: NORMAL
Gram Stain: NONE SEEN

## 2019-06-26 LAB — INSULIN AND C-PEPTIDE, SERUM
C-Peptide: 1.4 ng/mL (ref 1.1–4.4)
Insulin: 6.4 u[IU]/mL (ref 2.6–24.9)

## 2019-06-26 MED ORDER — POTASSIUM CHLORIDE 20 MEQ/15ML (10%) PO SOLN: 40.0000 meq | Freq: Once | ORAL | Status: DC

## 2019-06-26 MED ORDER — POTASSIUM CHLORIDE 10 MEQ/50ML IV SOLN
10.0000 meq | INTRAVENOUS | Status: AC
  Administered 2019-06-26 (×3): 10 meq via INTRAVENOUS
  Filled 2019-06-26 (×3): qty 50

## 2019-06-26 NOTE — Progress Notes (Signed)
Per dietician, Imagene Sheller, tube feedings started this day at 25ml/hr at this time.  Will see if patient tolerates this rate.

## 2019-06-26 NOTE — Progress Notes (Signed)
CRITICAL CARE NOTE  49 year old former smoker with a history very severe COPD stage IV, who presented via EMS after being found down and unresponsive.  Apparently the patient's mother placed a call to EMS.  The patient has a longstanding history of COPD and noncompliance with his oxygen and CPAP.  History could not be obtained from the patient as there was no family available and the patient had been intubated and was mechanically ventilated in the ED.  Of note he had recently been admitted to [facility] 29 May 2019 through 03 June 2019.  He had been evaluated by palliative care during this admission and actually had a DNR order.   Patient also had a 2D echo  with pulmonary hypertension consistent with cor pulmonale.  Records review: Patient has been following with Dr. Ned Clines at [facility].  Last known PFTs 11 September 2017.  FEV1 0.52 L or 20% predicted, FVC 1.49 L or 45% of predicted, FEV1/FVC 35%.  FEF 25-75 6%.  DLCO was 33%.  This is consistent with very severe COPD.  Events: 4/13 admitted for severe resp failure from COPD 4/14 remains critically ill on vent 4/15 remains critically ill, severe COPD, failed SAT/SBT 4/16 extubated yesterday, had to be reintubated remains on ventilator 4/17 sedated on the ventilator 4/18 abdominal distention, CT abdomen and pelvis consistent with ileus and constipation 4/19-patietn remains on ventialtor awainting trach 4/20 - patient is on vent with trach, he had episodes of aggitation today but eventually improved  4/21 - patient with significant distension of abdomen, concern for aerophagia in NIV.  There is mucopurulent exudation from trache this was cultures and patient placed on Unasyn for tracheitis 06/24/19 -trache site improved with less malodorous pus draining, NGT with decompression worked well abdomen is less distended. S/p PEG 06/25/19- patient had some anxiety throughout day today, we have started low dose anxiolytic. His PEG is now being  utilized. He has some placement issues with social work due to insurance and we are working on this now.  06/26/19-Patient had no events today, we are waiting for placement now from transitional care team  CC  Severe COPD Severe resp failure     BP (!) 117/91   Pulse 73   Temp 97.7 F (36.5 C) (Axillary)   Resp 18   Ht 5\' 6"  (1.676 m)   Wt 59.8 kg   SpO2 99%   BMI 21.28 kg/m    I/O last 3 completed shifts: In: 1874 [I.V.:1281.4; IV Piggyback:592.6] Out: 300 [Drains:300] Total I/O In: 1456 [I.V.:926; IV Piggyback:530] Out: 0   SpO2: 99 % O2 Flow Rate (L/min): 28 L/min FiO2 (%): 28 %  Estimated body mass index is 21.28 kg/m as calculated from the following:   Height as of this encounter: 5\' 6"  (1.676 m).   Weight as of this encounter: 59.8 kg.  REVIEW OF SYSTEMS  10 point ROS done and is neg except as per subjective findings  PHYSICAL EXAMINATION: Physical Exam  Constitutional: No distress.  HENT:  Head: Normocephalic and atraumatic.    Cardiovascular: Regular rhythm.  Pulmonary/Chest: Breath sounds normal. No accessory muscle usage. Tachypnea noted. He is in respiratory distress.  Abdominal: Bowel sounds are normal. He exhibits no distension, no fluid wave and no ascites. There is no abdominal tenderness.  Neurological: He is alert.       MEDICATIONS: I have reviewed all medications and confirmed regimen as documented   CULTURE RESULTS   Recent Results (from the past 240 hour(s))  Aerobic  Culture (superficial specimen)     Status: None   Collection Time: 06/23/19  4:55 PM   Specimen: Neck  Result Value Ref Range Status   Specimen Description   Final    NECK Performed at [facility], [address], [place], [place] [address]    Special Requests   Final    NONE Performed at [facility], [place], [place] [address]    Gram Stain   Final    NO WBC SEEN ABUNDANT GRAM POSITIVE COCCI ABUNDANT GRAM NEGATIVE  RODS ABUNDANT GRAM POSITIVE RODS    Culture   Final    NORMAL OROPHARYNGEAL FLORA Performed at [facility], [place] [address] [place], [place] [address]    Report Status 06/26/2019 FINAL  Final        BMP Latest Ref Rng & Units 06/26/2019 06/25/2019 06/24/2019  Glucose 70 - 99 mg/dL 92 369(H) 107(H)  BUN 6 - 20 mg/dL 10 9 10   Creatinine 0.61 - 1.24 mg/dL 0.54(L) 0.55(L) 0.46(L)  Sodium 135 - 145 mmol/L 133(L) 132(L) 139  Potassium 3.5 - 5.1 mmol/L 3.4(L) 3.3(L) 3.2(L)  Chloride 98 - 111 mmol/L 91(L) 91(L) 95(L)  CO2 22 - 32 mmol/L 32 34(H) 33(H)  Calcium 8.9 - 10.3 mg/dL 8.6(L) 8.2(L) 8.4(L)         Indwelling Urinary Catheter continued, requirement due to   Reason to continue Indwelling Urinary Catheter strict Intake/Output monitoring for hemodynamic instability         Ventilator continued, requirement due to severe respiratory failure   Ventilator Sedation RASS 0 to -2      ASSESSMENT AND PLAN   Acute on chronic hypercarbic/hypoxic respiratory failure COPD exacerbation s/ptracheostomy - there is mucopurulent exudation concerning for  Bronchodilators: DuoNeb every 4 and as needed IV corticosteroids There continues to be no indication for antibiotics at present  Severe COPD stage IV  Noted noncompliance with oxygen/BiPAP at home -continue LAMA/LABA/ICS   Tracheitis associated with tracheostomy -S/p culture of mucopurulent exudate -preliminary microbiology with - mixed species full report in progress -s/p Unasyn with significant improvement in discharge and malodorous smell - continue with current antimicrobial    Acute encephalopathy Unresponsiveness Due to hypercarbia Has resolved   Hypoglycemia Due to poor nutritional status Continue tube feeds Add D10 ICU hypoglycemia protocol Poor prognostic sign  Acute kidney injury  Avoid nephrotoxins Stable with no worsening Trend renal panel  Hyperkalemia Likely triggered by  acidosis RESOLVED  ID Procalcitonin normal No infiltrate on chest x-ray No indication for antibiotics at present Continue to trend WBC/fever curve Had 1 single blood culture of MRSA likely contaminant [name] MRSA PCR negative  Abdominal distention Ileus CT abdomen and pelvis: No obstruction: No free air Findings likely due to ileus, constipation Bowel regimen  Prophylaxis: Lovenox Famotidine    Critical care provider statement:    Critical care time (minutes):  35   Critical care time was exclusive of:  Separately billable procedures and  treating other patients   Critical care was necessary to treat or prevent imminent or  life-threatening deterioration of the following conditions:  Acute hypercapnic respiratory failure requiring tracheostomy   Critical care was time spent personally by me on the following  activities:  Development of treatment plan with patient or surrogate,  discussions with consultants, evaluation of patient's response to  treatment, examination of patient, obtaining history from patient or  surrogate, ordering and performing treatments and interventions, ordering  and review of laboratory studies  and re-evaluation of patient's condition   I assumed direction of critical care for this patient from another  provider in my specialty: no       Vida Rigger, M.D.  Pulmonary & Critical Care Medicine  [facility]

## 2019-06-26 NOTE — Progress Notes (Signed)
PHARMACY CONSULT NOTE  Pharmacy Consult for Electrolyte Monitoring and Replacement   Recent Labs: Potassium (mmol/L)  Date Value  06/26/2019 3.4 (L)  09/24/2013 4.6   Magnesium (mg/dL)  Date Value  14/64/3142 2.0   Calcium (mg/dL)  Date Value  76/70/1100 8.6 (L)   Calcium, Total (mg/dL)  Date Value  34/96/1164 8.9   Albumin (g/dL)  Date Value  35/39/1225 3.5   Phosphorus (mg/dL)  Date Value  83/46/2194 3.7   Sodium (mmol/L)  Date Value  06/26/2019 133 (L)  09/24/2013 138     Assessment: 49 year old male admitted after being found unresponsive. Patient was intubated in the ED and transferred to the ICU. Patient started on tube feeds and at refeeding risk.  Patient extubated 4/15 am and placed on BiPAP. Patient required reintubation.   Trach 4/20, G tube placement 4/22.  Goal of Therapy:  Electrolytes WNL  Plan:  K 3.4  Mag 2.0  Phos 3.7  Scr 0.54   Patient is not tolerating feeds at this time. May try again today 4/24. 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  F/u in am.  Angelique Blonder, PharmD Clinical Pharmacist 06/26/2019 8:45 AM

## 2019-06-27 LAB — CBC WITH DIFFERENTIAL/PLATELET
Abs Immature Granulocytes: 0.09 10*3/uL — ABNORMAL HIGH (ref 0.00–0.07)
Basophils Absolute: 0 10*3/uL (ref 0.0–0.1)
Basophils Relative: 0 %
Eosinophils Absolute: 0 10*3/uL (ref 0.0–0.5)
Eosinophils Relative: 0 %
HCT: 26.8 % — ABNORMAL LOW (ref 39.0–52.0)
Hemoglobin: 8.7 g/dL — ABNORMAL LOW (ref 13.0–17.0)
Immature Granulocytes: 1 %
Lymphocytes Relative: 13 %
Lymphs Abs: 1.2 10*3/uL (ref 0.7–4.0)
MCH: 31 pg (ref 26.0–34.0)
MCHC: 32.5 g/dL (ref 30.0–36.0)
MCV: 95.4 fL (ref 80.0–100.0)
Monocytes Absolute: 0.9 10*3/uL (ref 0.1–1.0)
Monocytes Relative: 9 %
Neutro Abs: 7.2 10*3/uL (ref 1.7–7.7)
Neutrophils Relative %: 77 %
Platelets: 161 10*3/uL (ref 150–400)
RBC: 2.81 MIL/uL — ABNORMAL LOW (ref 4.22–5.81)
RDW: 12.9 % (ref 11.5–15.5)
WBC: 9.4 10*3/uL (ref 4.0–10.5)
nRBC: 0 % (ref 0.0–0.2)

## 2019-06-27 LAB — GLUCOSE, CAPILLARY
Glucose-Capillary: 109 mg/dL — ABNORMAL HIGH (ref 70–99)
Glucose-Capillary: 111 mg/dL — ABNORMAL HIGH (ref 70–99)
Glucose-Capillary: 116 mg/dL — ABNORMAL HIGH (ref 70–99)
Glucose-Capillary: 119 mg/dL — ABNORMAL HIGH (ref 70–99)
Glucose-Capillary: 125 mg/dL — ABNORMAL HIGH (ref 70–99)
Glucose-Capillary: 87 mg/dL (ref 70–99)

## 2019-06-27 LAB — CREATININE, SERUM
Creatinine, Ser: 0.53 mg/dL — ABNORMAL LOW (ref 0.61–1.24)
GFR calc Af Amer: >60 mL/min (ref >60)
GFR calc non Af Amer: >60 mL/min (ref >60)

## 2019-06-27 LAB — MAGNESIUM: Magnesium: 1.7 mg/dL (ref 1.7–2.4)

## 2019-06-27 LAB — POTASSIUM: Potassium: 3.3 mmol/L — ABNORMAL LOW (ref 3.5–5.1)

## 2019-06-27 LAB — TRIGLYCERIDES: Triglycerides: 62 mg/dL (ref <150)

## 2019-06-27 LAB — PHOSPHORUS: Phosphorus: 3.1 mg/dL (ref 2.5–4.6)

## 2019-06-27 MED ORDER — POTASSIUM CHLORIDE 10 MEQ/50ML IV SOLN
10.0000 meq | INTRAVENOUS | Status: AC
  Administered 2019-06-27 (×4): 10 meq via INTRAVENOUS
  Filled 2019-06-27 (×4): qty 50

## 2019-06-27 MED ORDER — MAGNESIUM SULFATE 2 GM/50ML IV SOLN
2.0000 g | Freq: Once | INTRAVENOUS | Status: AC
  Administered 2019-06-27: 09:00:00 2 g via INTRAVENOUS
  Filled 2019-06-27: qty 50

## 2019-06-27 MED ORDER — MAGNESIUM SULFATE 2 GM/50ML IV SOLN: 2.0000 g | Freq: Once | INTRAVENOUS | Status: DC

## 2019-06-27 MED ORDER — CHLORHEXIDINE GLUCONATE 0.12 % MT SOLN
OROMUCOSAL | Status: AC
  Filled 2019-06-27: qty 15

## 2019-06-27 MED ORDER — ENOXAPARIN SODIUM 40 MG/0.4ML ~~LOC~~ SOLN
40.0000 mg | SUBCUTANEOUS | Status: DC
  Administered 2019-06-27 – 2019-06-29 (×3): 40 mg via SUBCUTANEOUS
  Filled 2019-06-27 (×3): qty 0.4

## 2019-06-27 NOTE — Progress Notes (Signed)
CRITICAL CARE NOTE  49 year old former smoker with a history very severe COPD stage IV, who presented via EMS after being found down and unresponsive.  Apparently the patient's mother placed a call to EMS.  The patient has a longstanding history of COPD and noncompliance with his oxygen and CPAP.  History could not be obtained from the patient as there was no family available and the patient had been intubated and was mechanically ventilated in the ED.  Of note he had recently been admitted to [facility] 29 May 2019 through 03 June 2019.  He had been evaluated by palliative care during this admission and actually had a DNR order.   Patient also had a 2D echo  with pulmonary hypertension consistent with cor pulmonale.  Records review: Patient has been following with Dr. Ned Clines at [facility].  Last known PFTs 11 September 2017.  FEV1 0.52 L or 20% predicted, FVC 1.49 L or 45% of predicted, FEV1/FVC 35%.  FEF 25-75 6%.  DLCO was 33%.  This is consistent with very severe COPD.  Events: 4/13 admitted for severe resp failure from COPD 4/14 remains critically ill on vent 4/15 remains critically ill, severe COPD, failed SAT/SBT 4/16 extubated yesterday, had to be reintubated remains on ventilator 4/17 sedated on the ventilator 4/18 abdominal distention, CT abdomen and pelvis consistent with ileus and constipation 4/19-patietn remains on ventialtor awainting trach 4/20 - patient is on vent with trach, he had episodes of aggitation today but eventually improved  4/21 - patient with significant distension of abdomen, concern for aerophagia in NIV.  There is mucopurulent exudation from trache this was cultures and patient placed on Unasyn for tracheitis 06/24/19 -trache site improved with less malodorous pus draining, NGT with decompression worked well abdomen is less distended. S/p PEG 06/25/19- patient had some anxiety throughout day today, we have started low dose anxiolytic. His PEG is now being  utilized. He has some placement issues with social work due to insurance and we are working on this now.  06/26/19-Patient had no events today, we are waiting for placement now from transitional care team 06/27/19- Patient is calm this morining, abdomen is mildy distended, awaiting placement to LTAC  CC  Severe COPD Severe resp failure     BP 102/75   Pulse 79   Temp 97.9 F (36.6 C) (Axillary)   Resp 18   Ht 5\' 6"  (1.676 m)   Wt 63.1 kg   SpO2 93%   BMI 22.45 kg/m    I/O last 3 completed shifts: In: 1798.1 [I.V.:1168.1; IV Piggyback:630] Out: 300 [Urine:300] No intake/output data recorded.  SpO2: 93 % O2 Flow Rate (L/min): 28 L/min FiO2 (%): 21 %  Estimated body mass index is 22.45 kg/m as calculated from the following:   Height as of this encounter: 5\' 6"  (1.676 m).   Weight as of this encounter: 63.1 kg.  REVIEW OF SYSTEMS  10 point ROS done and is neg except as per subjective findings  PHYSICAL EXAMINATION: Physical Exam  Constitutional: No distress.  HENT:  Head: Normocephalic and atraumatic.    Cardiovascular: Regular rhythm.  Pulmonary/Chest: Breath sounds normal. No accessory muscle usage. Tachypnea noted. He is in respiratory distress.  Abdominal: Bowel sounds are normal. He exhibits no distension, no fluid wave and no ascites. There is no abdominal tenderness.  Neurological: He is alert.       MEDICATIONS: I have reviewed all medications and confirmed regimen as documented   CULTURE RESULTS   Recent Results (  from the past 240 hour(s))  Aerobic Culture (superficial specimen)     Status: None   Collection Time: 06/23/19  4:55 PM   Specimen: Neck  Result Value Ref Range Status   Specimen Description   Final    NECK Performed at [facility], [address], [place], [place] [address]    Special Requests   Final    NONE Performed at [facility], [address] [place], [place] [address]    Gram Stain   Final    NO  WBC SEEN ABUNDANT GRAM POSITIVE COCCI ABUNDANT GRAM NEGATIVE RODS ABUNDANT GRAM POSITIVE RODS    Culture   Final    NORMAL OROPHARYNGEAL FLORA Performed at [facility], [address] [place], [place] [address]    Report Status 06/26/2019 FINAL  Final        BMP Latest Ref Rng & Units 06/27/2019 06/26/2019 06/25/2019  Glucose 70 - 99 mg/dL - 92 914(N)  BUN 6 - 20 mg/dL - 10 9  Creatinine 8.29 - 1.24 mg/dL 5.62(Z) 3.08(M) 5.78(I)  Sodium 135 - 145 mmol/L - 133(L) 132(L)  Potassium 3.5 - 5.1 mmol/L 3.3(L) 3.4(L) 3.3(L)  Chloride 98 - 111 mmol/L - 91(L) 91(L)  CO2 22 - 32 mmol/L - 32 34(H)  Calcium 8.9 - 10.3 mg/dL - 8.6(L) 8.2(L)         Indwelling Urinary Catheter continued, requirement due to   Reason to continue Indwelling Urinary Catheter strict Intake/Output monitoring for hemodynamic instability         Ventilator continued, requirement due to severe respiratory failure   Ventilator Sedation RASS 0 to -2      ASSESSMENT AND PLAN   Acute on chronic hypercarbic/hypoxic respiratory failure COPD exacerbation s/ptracheostomy - there is mucopurulent exudation concerning for  Bronchodilators: DuoNeb every 4 and as needed IV corticosteroids There continues to be no indication for antibiotics at present  Severe COPD stage IV  Noted noncompliance with oxygen/BiPAP at home -continue LAMA/LABA/ICS   Tracheitis associated with tracheostomy -S/p culture of mucopurulent exudate -preliminary microbiology with - mixed species full report in progress -s/p Unasyn with significant improvement in discharge and malodorous smell - continue with current antimicrobial    Acute encephalopathy Unresponsiveness Due to hypercarbia Has resolved   Hypoglycemia Due to poor nutritional status Continue tube feeds Add D10 ICU hypoglycemia protocol Poor prognostic sign  Acute kidney injury  Avoid nephrotoxins Stable with no worsening Trend renal  panel  Hyperkalemia Likely triggered by acidosis RESOLVED  ID Procalcitonin normal No infiltrate on chest x-ray No indication for antibiotics at present Continue to trend WBC/fever curve Had 1 single blood culture of MRSA likely contaminant [name] MRSA PCR negative  Abdominal distention Ileus CT abdomen and pelvis: No obstruction: No free air Findings likely due to ileus, constipation Bowel regimen  Prophylaxis: Lovenox Famotidine    Critical care provider statement:    Critical care time (minutes):  35   Critical care time was exclusive of:  Separately billable procedures and  treating other patients   Critical care was necessary to treat or prevent imminent or  life-threatening deterioration of the following conditions:  Acute hypercapnic respiratory failure requiring tracheostomy   Critical care was time spent personally by me on the following  activities:  Development of treatment plan with patient or surrogate,  discussions with consultants, evaluation of patient's response to  treatment, examination of patient, obtaining history from patient or  surrogate, ordering and performing treatments and interventions,  ordering  and review of laboratory studies and re-evaluation of patient's condition   I assumed direction of critical care for this patient from another  provider in my specialty: no       Ottie Glazier, M.D.  Pulmonary & [place]

## 2019-06-27 NOTE — Progress Notes (Signed)
Patient alert and oriented.  Continues with trach/vent.  Patient asking for food and drink.  Tube feed stopped per patient request.  Doctor on shift aware.  Medications given and tolerated well. Good urine output.  Temp 99.0.  Will continue to monitor.

## 2019-06-27 NOTE — Progress Notes (Signed)
PHARMACY CONSULT NOTE  Pharmacy Consult for Electrolyte Monitoring and Replacement   Recent Labs: Potassium (mmol/L)  Date Value  06/27/2019 3.3 (L)  09/24/2013 4.6   Magnesium (mg/dL)  Date Value  07/46/0029 1.7   Calcium (mg/dL)  Date Value  84/73/0856 8.6 (L)   Calcium, Total (mg/dL)  Date Value  94/37/0052 8.9   Albumin (g/dL)  Date Value  59/12/2888 3.5   Phosphorus (mg/dL)  Date Value  22/84/0698 3.1   Sodium (mmol/L)  Date Value  06/26/2019 133 (L)  09/24/2013 138     Assessment: 49 year old male admitted after being found unresponsive. Patient was intubated in the ED and transferred to the ICU. Patient started on tube feeds and at refeeding risk.  Patient extubated 4/15 am and placed on BiPAP. Patient required reintubation.   Trach 4/20, G tube placement 4/22.  Goal of Therapy:  Electrolytes WNL  Plan:  K 3.3  Mag 1.7  Phos 3.1  Scr 0.53   Patient is not tolerating feeds at this time?.  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 -Will order Magnesium sulfate 2 gm IV x 1  F/u labs in am.  Angelique Blonder, PharmD Clinical Pharmacist 06/27/2019 8:42 AM

## 2019-06-28 DIAGNOSIS — L8990 Pressure ulcer of unspecified site, unspecified stage: Secondary | ICD-10-CM | POA: Insufficient documentation

## 2019-06-28 DIAGNOSIS — J441 Chronic obstructive pulmonary disease with (acute) exacerbation: Secondary | ICD-10-CM | POA: Diagnosis not present

## 2019-06-28 DIAGNOSIS — R4189 Other symptoms and signs involving cognitive functions and awareness: Secondary | ICD-10-CM | POA: Diagnosis not present

## 2019-06-28 LAB — BASIC METABOLIC PANEL
Anion gap: 6 (ref 5–15)
BUN: 7 mg/dL (ref 6–20)
CO2: 31 mmol/L (ref 22–32)
Calcium: 8.5 mg/dL — ABNORMAL LOW (ref 8.9–10.3)
Chloride: 96 mmol/L — ABNORMAL LOW (ref 98–111)
Creatinine, Ser: 0.48 mg/dL — ABNORMAL LOW (ref 0.61–1.24)
GFR calc Af Amer: >60 mL/min (ref >60)
GFR calc non Af Amer: >60 mL/min (ref >60)
Glucose, Bld: 133 mg/dL — ABNORMAL HIGH (ref 70–99)
Potassium: 3.9 mmol/L (ref 3.5–5.1)
Sodium: 133 mmol/L — ABNORMAL LOW (ref 135–145)

## 2019-06-28 LAB — CBC WITH DIFFERENTIAL/PLATELET
Abs Immature Granulocytes: 0.08 10*3/uL — ABNORMAL HIGH (ref 0.00–0.07)
Basophils Absolute: 0 10*3/uL (ref 0.0–0.1)
Basophils Relative: 0 %
Eosinophils Absolute: 0 10*3/uL (ref 0.0–0.5)
Eosinophils Relative: 0 %
HCT: 27.7 % — ABNORMAL LOW (ref 39.0–52.0)
Hemoglobin: 8.9 g/dL — ABNORMAL LOW (ref 13.0–17.0)
Immature Granulocytes: 1 %
Lymphocytes Relative: 14 %
Lymphs Abs: 1.5 10*3/uL (ref 0.7–4.0)
MCH: 30.7 pg (ref 26.0–34.0)
MCHC: 32.1 g/dL (ref 30.0–36.0)
MCV: 95.5 fL (ref 80.0–100.0)
Monocytes Absolute: 0.9 10*3/uL (ref 0.1–1.0)
Monocytes Relative: 8 %
Neutro Abs: 8.5 10*3/uL — ABNORMAL HIGH (ref 1.7–7.7)
Neutrophils Relative %: 77 %
Platelets: 171 10*3/uL (ref 150–400)
RBC: 2.9 MIL/uL — ABNORMAL LOW (ref 4.22–5.81)
RDW: 12.9 % (ref 11.5–15.5)
WBC: 11 10*3/uL — ABNORMAL HIGH (ref 4.0–10.5)
nRBC: 0 % (ref 0.0–0.2)

## 2019-06-28 LAB — GLUCOSE, CAPILLARY
Glucose-Capillary: 105 mg/dL — ABNORMAL HIGH (ref 70–99)
Glucose-Capillary: 105 mg/dL — ABNORMAL HIGH (ref 70–99)
Glucose-Capillary: 81 mg/dL (ref 70–99)
Glucose-Capillary: 89 mg/dL (ref 70–99)
Glucose-Capillary: 95 mg/dL (ref 70–99)
Glucose-Capillary: 98 mg/dL (ref 70–99)

## 2019-06-28 LAB — ALPHA-1 ANTITRYPSIN PHENOTYPE: A-1 Antitrypsin, Ser: 191 mg/dL — ABNORMAL HIGH (ref 101–187)

## 2019-06-28 LAB — MAGNESIUM: Magnesium: 1.8 mg/dL (ref 1.7–2.4)

## 2019-06-28 LAB — PHOSPHORUS: Phosphorus: 3.5 mg/dL (ref 2.5–4.6)

## 2019-06-28 MED ORDER — CHLORHEXIDINE GLUCONATE 0.12 % MT SOLN
15.0000 mL | Freq: Two times a day (BID) | OROMUCOSAL | Status: DC
  Administered 2019-06-29 – 2019-07-29 (×59): 15 mL via OROMUCOSAL
  Filled 2019-06-28 (×51): qty 15

## 2019-06-28 MED ORDER — ORAL CARE MOUTH RINSE
15.0000 mL | Freq: Two times a day (BID) | OROMUCOSAL | Status: DC
  Administered 2019-06-29 – 2019-07-29 (×49): 15 mL via OROMUCOSAL

## 2019-06-28 NOTE — Progress Notes (Signed)
CRITICAL CARE NOTE  49 year old former smoker with a history very severe COPD stage IV, who presented via EMS after being found down and unresponsive.  Apparently the patient's mother placed a call to EMS.  The patient has a longstanding history of COPD and noncompliance with his oxygen and CPAP.  History could not be obtained from the patient as there was no family available and the patient had been intubated and was mechanically ventilated in the ED.  Of note he had recently been admitted to [facility] 29 May 2019 through 03 June 2019.  He had been evaluated by palliative care during this admission and actually had a DNR order.   Patient also had a 2D echo  with pulmonary hypertension consistent with cor pulmonale.  Records review: Patient has been following with Dr. Ned Clines at [facility].  Last known PFTs 11 September 2017.  FEV1 0.52 L or 20% predicted, FVC 1.49 L or 45% of predicted, FEV1/FVC 35%.  FEF 25-75 6%.  DLCO was 33%.  This is consistent with very severe COPD.  Events: 4/13 admitted for severe resp failure from COPD 4/14 remains critically ill on vent 4/15 remains critically ill, severe COPD, failed SAT/SBT 4/16 extubated yesterday, had to be reintubated remains on ventilator 4/17 sedated on the ventilator 4/18 abdominal distention, CT abdomen and pelvis consistent with ileus and constipation 4/19-patietn remains on ventialtor awainting trach 4/20 - patient is on vent with trach, he had episodes of aggitation today but eventually improved  4/21 - patient with significant distension of abdomen, concern for aerophagia in NIV.  There is mucopurulent exudation from trache this was cultures and patient placed on Unasyn for tracheitis 06/24/19 -trache site improved with less malodorous pus draining, NGT with decompression worked well abdomen is less distended. S/p PEG 06/25/19- patient had some anxiety throughout day today, we have started low dose anxiolytic. His PEG is now being  utilized. He has some placement issues with social work due to insurance and we are working on this now.  06/26/19-Patient had no events today, we are waiting for placement now from transitional care team 06/27/19- Patient is calm this morining, abdomen is mildy distended, awaiting placement to [facility] [place] 4/26 plan for PS/TCT  CC  Follow up COPD Severe resp failure  HPI S/p trach and s/p PEG tube End stage COPD Alert and awake Attempt PS/TCT today     BP (!) 127/93   Pulse 70   Temp 98.7 F (37.1 C)   Resp 13   Ht 5\' 6"  (1.676 m)   Wt 59.2 kg   SpO2 99%   BMI 21.07 kg/m    I/O last 3 completed shifts: In: 2387.8 [I.V.:1637.7; IV Piggyback:750.1] Out: 3025 [Urine:3025] Total I/O In: 10 [I.V.:10] Out: -   SpO2: 99 % O2 Flow Rate (L/min): 5 L/min FiO2 (%): 28 %  Estimated body mass index is 21.07 kg/m as calculated from the following:   Height as of this encounter: 5\' 6"  (1.676 m).   Weight as of this encounter: 59.2 kg.   Review of Systems:  Gen:  Denies  fever, sweats, chills weight loss  HEENT: Denies blurred vision, double vision, ear pain, eye pain, hearing loss, nose bleeds, sore throat Cardiac:  No dizziness, chest pain or heaviness, chest tightness,edema, No JVD Resp: +shortness of breath,-wheezing, -hemoptysis,  Gi: Denies swallowing difficulty, stomach pain, nausea or vomiting, diarrhea, Other:  All other systems negative  PHYSICAL EXAMINATION: Physical Exam  Constitutional: No distress.  HENT:  Head: Normocephalic  and atraumatic.    Cardiovascular: Regular rhythm.  Pulmonary/Chest: Breath sounds normal. No accessory muscle usage. Tachypnea noted. He is in respiratory distress.  Abdominal: Bowel sounds are normal. He exhibits no distension, no fluid wave and no ascites. There is no abdominal tenderness.  Neurological: He is alert.       MEDICATIONS: I have reviewed all medications and confirmed regimen as documented   CULTURE RESULTS    Recent Results (from the past 240 hour(s))  Aerobic Culture (superficial specimen)     Status: None   Collection Time: 06/23/19  4:55 PM   Specimen: Neck  Result Value Ref Range Status   Specimen Description   Final    NECK Performed at [facility], [address], [place], [place] [address]    Special Requests   Final    NONE Performed at [facility], [place], [place] [address]    Gram Stain   Final    NO WBC SEEN ABUNDANT GRAM POSITIVE COCCI ABUNDANT GRAM NEGATIVE RODS ABUNDANT GRAM POSITIVE RODS    Culture   Final    NORMAL OROPHARYNGEAL FLORA Performed at [facility], [place] [address] [place], [place] [address]    Report Status 06/26/2019 FINAL  Final        BMP Latest Ref Rng & Units 06/28/2019 06/27/2019 06/26/2019  Glucose 70 - 99 mg/dL 133(H) - 92  BUN 6 - 20 mg/dL 7 - 10  Creatinine 0.61 - 1.24 mg/dL 0.48(L) 0.53(L) 0.54(L)  Sodium 135 - 145 mmol/L 133(L) - 133(L)  Potassium 3.5 - 5.1 mmol/L 3.9 3.3(L) 3.4(L)  Chloride 98 - 111 mmol/L 96(L) - 91(L)  CO2 22 - 32 mmol/L 31 - 32  Calcium 8.9 - 10.3 mg/dL 8.5(L) - 8.6(L)        Indwelling Urinary Catheter continued, requirement due to   Reason to continue Indwelling Urinary Catheter strict Intake/Output monitoring for hemodynamic instability         Ventilator continued, requirement due to severe respiratory failure          ASSESSMENT AND PLAN  Severe ACUTE Hypoxic and Hypercapnic Respiratory Failure from end stage COPD with acute COPD exacerbation Wean vent as tolerated OOB to chair as tolerated  SEVERE COPD EXACERBATION -continue IV steroids as prescribed -continue NEB THERAPY as prescribed  TRACHEITIS One more day of ABX   Prophylaxis: Lovenox Famotidine   SNF pending   Corrin Parker, M.D.  [facility]  Medical Director [place] Director [place] Department

## 2019-06-28 NOTE — Progress Notes (Addendum)
Trach site sutures removed per post op notes by Dr. Andee Poles. Tracheostomy site scrubbed with normal saline to remove crusty secretions around insertion site. Patient has been on trach collar all day without issues. Up in chair x 2 hours and walked a short distance to chair with walker. Used BSC for BM then back to bed. Attempted to place back on ventilator but patient requested to stay on trach collar. Suctioned x 4 for small amount of secretions. Room mate in to visit late. Patient ask for candy and a piece of cake and smiled.

## 2019-06-28 NOTE — TOC Progression Note (Addendum)
Transition of Care [facility]) - Progression Note    Patient Details  Name: Chris Case MRN: [id number] Date of Birth: 03/23/1970  Transition of Care [facility]) CM/SW Contact  Liliana Cline, LCSW Phone Number: 06/28/2019, 9:02 AM  Clinical Narrative:   Per TOC Leadership, Ena Dawley [facility] may be able to accommodate patient's needs. CSW called and left message for [facility] in Admissions. Requested a return call.   1:15- CSW attempted another call to [place]. Left another voicemail for [facility] requesting a return call. Completed FL2 to try to send to [place]. They are not on the HUB. Will continue to try to reach [facility] to follow-up on if they would be able to accept patient.         Expected Discharge Plan and Services                                                 Social Determinants of Health (SDOH) Interventions    Readmission Risk Interventions No flowsheet data found.

## 2019-06-28 NOTE — Progress Notes (Signed)
CH visited pt. while rounding on ICU and per pt.'s waving CH into rm.  Pt. sitting up in bed w/trach collar; appears to be doing well --> expressed feeling better today.  Pt. eagerly anticipates discharge; asked for prayer.  CH will continue to monitor pt. needs.    06/28/19 1115  Clinical Encounter Type  Visited With Patient  Visit Type Follow-up;Psychological support;Social support;Spiritual support  Referral From Patient  Consult/Referral To Chaplain  Spiritual Encounters  Spiritual Needs Emotional;Prayer  Stress Factors  Patient Stress Factors Loss of control;Major life changes

## 2019-06-28 NOTE — Progress Notes (Signed)
Pt requested to be placed back on the vent for resp distress. RN aware and at bedside.

## 2019-06-28 NOTE — NC FL2 (Signed)
[place] LEVEL OF CARE SCREENING TOOL     IDENTIFICATION  Patient Name: Chris Case Birthdate: 1970-10-13 Sex: male Admission Date (Current Location): 06/15/2019  [place] and [place] Number:  Engineering geologist and Address:  [facility], [address], [place], [place] [address]      Provider Number: [id number]  Attending Physician Name and Address:  Flora Lipps, MD  Relative Name and Phone Number:  Gerri Lins- Daughter- [phone] / [phone]    Current Level of Care: Hospital Recommended Level of Care: [place] Prior Approval Number:    Date Approved/Denied:   PASRR Number:    Discharge Plan:      Current Diagnoses: Patient Active Problem List   Diagnosis Date Noted  . Unresponsiveness 06/15/2019  . Encounter for hospice care discussion   . Left eyelid laceration   . Acute metabolic encephalopathy   . Pulmonary hypertension ([place])   . Goals of care, counseling/discussion   . Palliative care by specialist   . DNR (do not resuscitate) discussion   . COPD, very severe ([place])   . Elevated troponin 05/29/2019  . Syncope 05/29/2019  . Acute on chronic respiratory failure with hypoxemia ([place]) 04/16/2019  . Acute respiratory failure with hypoxia ([place]) 01/24/2019  . Thrombocytopenia ([place]) 01/24/2019  . Protein-calorie malnutrition, severe 12/10/2016  . Intestinal adhesions with complete obstruction ([place]) 12/05/2016  . Constipation   . SBO (small bowel obstruction) ([place])   . Acute bronchitis 09/26/2015  . COPD exacerbation ([place]) 09/24/2015  . Acute on chronic respiratory failure with hypoxia and hypercapnia ([place]) 09/24/2015  . Abnormal ECG 09/24/2015  . Edema, lower extremity 09/24/2015    Orientation RESPIRATION BLADDER Height & Weight     Self, Time, Situation, Place  Vent, Tracheostomy(Trach collar; Trilogy) Continent Weight: 130 lb 8.2 oz (59.2 kg) Height:  '5\' 6"'  (167.6 cm)  BEHAVIORAL  SYMPTOMS/MOOD NEUROLOGICAL BOWEL NUTRITION STATUS    (acute metabolic encephalopathy) Continent Diet, Feeding tube(NPO, Osmolite 1.5 Cal at 60 mL/hr x 24 hours per G tube daily; free water flush of 120 mL Q6 hours)  AMBULATORY STATUS COMMUNICATION OF NEEDS Skin   Extensive Assist Verbally Surgical wounds(Stage 2 R buttocks; Surgical Incisions to neck & abdomen (closed)- daily gauze)                       Personal Care Assistance Level of Assistance  Bathing, Feeding, Dressing Bathing Assistance: Maximum assistance Feeding assistance: Maximum assistance Dressing Assistance: Maximum assistance     Functional Limitations Info             SPECIAL CARE FACTORS FREQUENCY  PT (By licensed PT), OT (By licensed OT), Speech therapy     PT Frequency: 5x/week OT Frequency: 5x/week            Contractures Contractures Info: Not present    Additional Factors Info  Code Status, Allergies Code Status Info: DNR Allergies Info: NKA           Current Medications (06/28/2019):  This is the current hospital active medication list Current Facility-Administered Medications  Medication Dose Route Frequency Provider Last Rate Last Admin  . 0.9 %  sodium chloride infusion   Intravenous PRN Jules Husbands, MD   Stopped at 06/25/19 1712  . acetaminophen (TYLENOL) suppository 650 mg  650 mg Rectal Q6H PRN Awilda Bill, NP      . acetaminophen (TYLENOL) tablet 650 mg  650 mg Per Tube Q4H PRN  Caroleen Hamman F, MD   650 mg at 06/16/19 1934  . ampicillin-sulbactam (UNASYN) 1.5 g in sodium chloride 0.9 % 100 mL IVPB  1.5 g Intravenous Q6H Pabon, Diego F, MD 200 mL/hr at 06/28/19 1127 1.5 g at 06/28/19 1127  . chlorhexidine gluconate (MEDLINE KIT) (PERIDEX) 0.12 % solution 15 mL  15 mL Mouth Rinse BID Pabon, Diego F, MD   15 mL at 06/28/19 0800  . Chlorhexidine Gluconate Cloth 2 % PADS 6 each  6 each Topical Daily Jules Husbands, MD   6 each at 06/27/19 2115  . dextrose 10 % infusion    Intravenous Continuous Jules Husbands, MD 50 mL/hr at 06/28/19 0656 Rate Verify at 06/28/19 0656  . docusate (COLACE) 50 MG/5ML liquid 100 mg  100 mg Oral BID Caroleen Hamman F, MD   100 mg at 06/27/19 1119  . enoxaparin (LOVENOX) injection 40 mg  40 mg Subcutaneous Q24H Ottie Glazier, MD   40 mg at 06/28/19 0800  . famotidine (PEPCID) IVPB 20 mg premix  20 mg Intravenous Q12H Jules Husbands, MD   Stopped at 06/27/19 2144  . feeding supplement (OSMOLITE 1.5 CAL) liquid 1,000 mL  1,000 mL Per Tube Continuous Flora Lipps, MD 60 mL/hr at 06/26/19 1403 1,000 mL at 06/26/19 1403  . fentaNYL (SUBLIMAZE) injection 50-200 mcg  50-200 mcg Intravenous Q30 min PRN Pabon, Diego F, MD   100 mcg at 06/27/19 0400  . free water 30 mL  30 mL Per Tube Q4H Aleskerov, Fuad, MD   30 mL at 06/27/19 1200  . ipratropium-albuterol (DUONEB) 0.5-2.5 (3) MG/3ML nebulizer solution 3 mL  3 mL Nebulization Q6H PRN Pabon, Diego F, MD   3 mL at 06/28/19 1127  . LORazepam (ATIVAN) injection 1 mg  1 mg Intravenous Q4H PRN Ottie Glazier, MD   1 mg at 06/27/19 0432  . MEDLINE mouth rinse  15 mL Mouth Rinse 10 times per day Caroleen Hamman F, MD   15 mL at 06/28/19 1000  . polyethylene glycol (MIRALAX / GLYCOLAX) packet 17 g  17 g Per Tube BID Pabon, Diego F, MD   17 g at 06/27/19 1057  . sodium chloride flush (NS) 0.9 % injection 10-40 mL  10-40 mL Intracatheter Q12H Pabon, Diego F, MD   10 mL at 06/28/19 1000  . sodium chloride flush (NS) 0.9 % injection 10-40 mL  10-40 mL Intracatheter PRN Pabon, Marjory Lies, MD         Discharge Medications: Please see discharge summary for a list of discharge medications.  Relevant Imaging Results:  Relevant Lab Results:   Additional Information SS#: [id number]  [place], LCSW

## 2019-06-28 NOTE — Progress Notes (Signed)
PHARMACY CONSULT NOTE  Pharmacy Consult for Electrolyte Monitoring and Replacement   Recent Labs: Potassium (mmol/L)  Date Value  06/28/2019 3.9  09/24/2013 4.6   Magnesium (mg/dL)  Date Value  45/84/8350 1.8   Calcium (mg/dL)  Date Value  75/73/2256 8.5 (L)   Calcium, Total (mg/dL)  Date Value  72/11/1978 8.9   Albumin (g/dL)  Date Value  22/17/9810 3.5   Phosphorus (mg/dL)  Date Value  25/48/6282 3.5   Sodium (mmol/L)  Date Value  06/28/2019 133 (L)  09/24/2013 138     Assessment: 49 year old male admitted after being found unresponsive. Patient was intubated in the ED and transferred to the ICU. Patient started on tube feeds and at refeeding risk.  Patient extubated 4/15 am and placed on BiPAP. Patient required reintubation.   Trach 4/20, G tube placement 4/22.  Goal of Therapy:  Electrolytes WNL  Plan:  K 3.9  Mag 1.8  Phos 3.5  Scr 0.48 No replacement needed at this time.  F/u labs in am.  Clovia Cuff, PharmD, BCPS 06/28/2019 2:13 PM

## 2019-06-29 DIAGNOSIS — J441 Chronic obstructive pulmonary disease with (acute) exacerbation: Secondary | ICD-10-CM | POA: Diagnosis not present

## 2019-06-29 LAB — BASIC METABOLIC PANEL
Anion gap: 6 (ref 5–15)
BUN: 6 mg/dL (ref 6–20)
CO2: 34 mmol/L — ABNORMAL HIGH (ref 22–32)
Calcium: 8.4 mg/dL — ABNORMAL LOW (ref 8.9–10.3)
Chloride: 97 mmol/L — ABNORMAL LOW (ref 98–111)
Creatinine, Ser: 0.47 mg/dL — ABNORMAL LOW (ref 0.61–1.24)
GFR calc Af Amer: >60 mL/min (ref >60)
GFR calc non Af Amer: >60 mL/min (ref >60)
Glucose, Bld: 108 mg/dL — ABNORMAL HIGH (ref 70–99)
Potassium: 3.6 mmol/L (ref 3.5–5.1)
Sodium: 137 mmol/L (ref 135–145)

## 2019-06-29 LAB — GLUCOSE, CAPILLARY
Glucose-Capillary: 104 mg/dL — ABNORMAL HIGH (ref 70–99)
Glucose-Capillary: 112 mg/dL — ABNORMAL HIGH (ref 70–99)
Glucose-Capillary: 118 mg/dL — ABNORMAL HIGH (ref 70–99)
Glucose-Capillary: 119 mg/dL — ABNORMAL HIGH (ref 70–99)
Glucose-Capillary: 84 mg/dL (ref 70–99)
Glucose-Capillary: 93 mg/dL (ref 70–99)

## 2019-06-29 LAB — CBC WITH DIFFERENTIAL/PLATELET
Abs Immature Granulocytes: 0.14 10*3/uL — ABNORMAL HIGH (ref 0.00–0.07)
Basophils Absolute: 0 10*3/uL (ref 0.0–0.1)
Basophils Relative: 0 %
Eosinophils Absolute: 0.2 10*3/uL (ref 0.0–0.5)
Eosinophils Relative: 2 %
HCT: 30.1 % — ABNORMAL LOW (ref 39.0–52.0)
Hemoglobin: 10 g/dL — ABNORMAL LOW (ref 13.0–17.0)
Immature Granulocytes: 1 %
Lymphocytes Relative: 20 %
Lymphs Abs: 2.1 10*3/uL (ref 0.7–4.0)
MCH: 30.5 pg (ref 26.0–34.0)
MCHC: 33.2 g/dL (ref 30.0–36.0)
MCV: 91.8 fL (ref 80.0–100.0)
Monocytes Absolute: 0.9 10*3/uL (ref 0.1–1.0)
Monocytes Relative: 9 %
Neutro Abs: 6.9 10*3/uL (ref 1.7–7.7)
Neutrophils Relative %: 68 %
Platelets: 195 10*3/uL (ref 150–400)
RBC: 3.28 MIL/uL — ABNORMAL LOW (ref 4.22–5.81)
RDW: 12.4 % (ref 11.5–15.5)
WBC: 10.1 10*3/uL (ref 4.0–10.5)
nRBC: 0 % (ref 0.0–0.2)

## 2019-06-29 LAB — PHOSPHORUS: Phosphorus: 4.1 mg/dL (ref 2.5–4.6)

## 2019-06-29 LAB — MAGNESIUM: Magnesium: 1.7 mg/dL (ref 1.7–2.4)

## 2019-06-29 MED ORDER — MAGNESIUM SULFATE 2 GM/50ML IV SOLN
2.0000 g | Freq: Once | INTRAVENOUS | Status: AC
  Administered 2019-06-29: 2 g via INTRAVENOUS
  Filled 2019-06-29: qty 50

## 2019-06-29 MED ORDER — SODIUM CHLORIDE 0.9 % IV SOLN
250.0000 mg | Freq: Three times a day (TID) | INTRAVENOUS | Status: DC
  Filled 2019-06-29 (×2): qty 5

## 2019-06-29 MED ORDER — BUDESONIDE 0.5 MG/2ML IN SUSP
0.5000 mg | Freq: Two times a day (BID) | RESPIRATORY_TRACT | Status: DC
  Administered 2019-06-29 – 2019-07-29 (×60): 0.5 mg via RESPIRATORY_TRACT
  Filled 2019-06-29 (×60): qty 2

## 2019-06-29 MED ORDER — CLONAZEPAM 0.5 MG PO TABS
0.5000 mg | ORAL_TABLET | Freq: Two times a day (BID) | ORAL | Status: AC
  Administered 2019-06-29 (×2): 0.5 mg via ORAL
  Filled 2019-06-29 (×2): qty 1

## 2019-06-29 MED ORDER — IPRATROPIUM-ALBUTEROL 0.5-2.5 (3) MG/3ML IN SOLN
3.0000 mL | RESPIRATORY_TRACT | Status: DC
  Administered 2019-06-29 – 2019-07-29 (×179): 3 mL via RESPIRATORY_TRACT
  Filled 2019-06-29 (×180): qty 3

## 2019-06-29 MED ORDER — CHLORHEXIDINE GLUCONATE 0.12 % MT SOLN
OROMUCOSAL | Status: AC
  Administered 2019-06-29: 15 mL
  Filled 2019-06-29: qty 15

## 2019-06-29 MED ORDER — SODIUM CHLORIDE 0.9 % IV SOLN
250.0000 mg | Freq: Three times a day (TID) | INTRAVENOUS | Status: AC
  Administered 2019-06-29 – 2019-07-01 (×8): 250 mg via INTRAVENOUS
  Filled 2019-06-29 (×10): qty 5

## 2019-06-29 MED ORDER — FAMOTIDINE 20 MG PO TABS
20.0000 mg | ORAL_TABLET | Freq: Two times a day (BID) | ORAL | Status: DC
  Administered 2019-06-29 – 2019-07-29 (×58): 20 mg
  Filled 2019-06-29 (×58): qty 1

## 2019-06-29 MED ORDER — SENNOSIDES-DOCUSATE SODIUM 8.6-50 MG PO TABS
2.0000 | ORAL_TABLET | Freq: Two times a day (BID) | ORAL | Status: DC
  Administered 2019-06-29 – 2019-07-08 (×13): 2
  Filled 2019-06-29 (×14): qty 2

## 2019-06-29 MED ORDER — ENOXAPARIN SODIUM 40 MG/0.4ML ~~LOC~~ SOLN
40.0000 mg | SUBCUTANEOUS | Status: DC
  Administered 2019-06-30 – 2019-07-29 (×29): 40 mg via SUBCUTANEOUS
  Filled 2019-06-29 (×29): qty 0.4

## 2019-06-29 MED ORDER — JUVEN PO PACK
1.0000 | PACK | Freq: Two times a day (BID) | ORAL | Status: DC
  Administered 2019-06-29 – 2019-07-29 (×53): 1

## 2019-06-29 MED ORDER — POTASSIUM CHLORIDE 20 MEQ PO PACK
40.0000 meq | PACK | Freq: Once | ORAL | Status: AC
  Administered 2019-06-29: 40 meq
  Filled 2019-06-29: qty 2

## 2019-06-29 NOTE — Progress Notes (Addendum)
Pt on trach collar o2 dropped into the 70's and he became Visually distressed, diaphoretic and having labored breathing. Complaining of being hot, SOB and stomach pain. I gave rx prn med for pain and got respiratory to put him back on the vent   Pt now resting comfortably and oxygen  In the 90's.

## 2019-06-29 NOTE — Progress Notes (Signed)
CRITICAL CARE NOTE  49 year old former smoker with a history very severe COPD stage IV, who presented via EMS after being found down and unresponsive.  Apparently the patient's mother placed a call to EMS.  The patient has a longstanding history of COPD and noncompliance with his oxygen and CPAP.  History could not be obtained from the patient as there was no family available and the patient had been intubated and was mechanically ventilated in the ED.  Of note he had recently been admitted to [facility] 29 May 2019 through 03 June 2019.  He had been evaluated by palliative care during this admission and actually had a DNR order.   Patient also had a 2D echo  with pulmonary hypertension consistent with cor pulmonale.  Records review: Patient has been following with Dr. Wallene Huh at [facility].  Last known PFTs 11 September 2017.  FEV1 0.52 L or 20% predicted, FVC 1.49 L or 45% of predicted, FEV1/FVC 35%.  FEF 25-75 6%.  DLCO was 33%.  This is consistent with very severe COPD.  Events: 4/13 admitted for severe resp failure from COPD 4/14 remains critically ill on vent 4/15 remains critically ill, severe COPD, failed SAT/SBT 4/16 extubated yesterday, had to be reintubated remains on ventilator 4/17 sedated on the ventilator 4/18 abdominal distention, CT abdomen and pelvis consistent with ileus and constipation 4/19-patietn remains on ventialtor awainting trach 4/20 - patient is on vent with trach, he had episodes of aggitation today but eventually improved  4/21 - patient with significant distension of abdomen, concern for aerophagia in NIV.  There is mucopurulent exudation from trache this was cultures and patient placed on Unasyn for tracheitis 06/24/19 -trache site improved with less malodorous pus draining, NGT with decompression worked well abdomen is less distended. S/p PEG 06/25/19- patient had some anxiety throughout day today, we have started low dose anxiolytic. His PEG is now being  utilized. He has some placement issues with social work due to insurance and we are working on this now.  06/26/19-Patient had no events today, we are waiting for placement now from transitional care team 06/27/19- Patient is calm this morining, abdomen is mildy distended, awaiting placement to [facility] 4/26 plan for PS/TCT  CC  Follow up COPD Severe resp failure On vent  HPI Pt was in resp distress, diaphoretic and having labored breathing. Complaining of being hot, sob and stomach pain. Placed him back on the vent he was on trach collar trial as well and that was causing his sob. His oxygen was dropping into the high 70's.  S/p trach and s/p PEG tube End stage COPD Alert and awake Back on vent     BP 116/80 (BP Location: Left Arm)   Pulse 93   Temp 98.5 F (36.9 C) (Oral)   Resp (!) 22   Ht 5\' 6"  (1.676 m)   Wt 59.3 kg   SpO2 100%   BMI 21.10 kg/m    I/O last 3 completed shifts: In: 4541.1 [I.V.:2845.1; NG/GT:480; IV Piggyback:1215.9] Out: 4950 [Urine:4950] No intake/output data recorded.  SpO2: 100 % O2 Flow Rate (L/min): 5 L/min FiO2 (%): 21 %  Estimated body mass index is 21.1 kg/m as calculated from the following:   Height as of this encounter: 5\' 6"  (1.676 m).   Weight as of this encounter: 59.3 kg.    Review of Systems:  Gen:  +SOB and resp distress HEENT: Denies blurred vision, double vision, ear pain, eye pain, hearing loss, nose bleeds, sore throat  Cardiac:  No dizziness, chest pain or heaviness, chest tightness,edema, No JVD Resp:   +shortness of breath Gi: +stomach pain, Other:  All other systems negative   PHYSICAL EXAMINATION: Physical Exam  Constitutional: No distress.  HENT:  Head: Normocephalic and atraumatic.    Cardiovascular: Regular rhythm.  Pulmonary/Chest: No accessory muscle usage. Tachypnea noted. He is in respiratory distress. He has wheezes.  Abdominal: Bowel sounds are normal. He exhibits no distension, no fluid wave and no  ascites. There is no abdominal tenderness.  Neurological: He is alert.       MEDICATIONS: I have reviewed all medications and confirmed regimen as documented   CULTURE RESULTS   Recent Results (from the past 240 hour(s))  Aerobic Culture (superficial specimen)     Status: None   Collection Time: 06/23/19  4:55 PM   Specimen: Neck  Result Value Ref Range Status   Specimen Description   Final    NECK Performed at [facility], [address] [place], [place] [address]    Special Requests   Final    NONE Performed at [facility], [address] [place], [place] [address]    Gram Stain   Final    NO WBC SEEN ABUNDANT GRAM POSITIVE COCCI ABUNDANT GRAM NEGATIVE RODS ABUNDANT GRAM POSITIVE RODS    Culture   Final    NORMAL OROPHARYNGEAL FLORA Performed at [facility], [address], [place], [place] [address]    Report Status 06/26/2019 FINAL  Final        BMP Latest Ref Rng & Units 06/29/2019 06/28/2019 06/27/2019  Glucose 70 - 99 mg/dL 979(G) 921(J) -  BUN 6 - 20 mg/dL 6 7 -  Creatinine 9.41 - 1.24 mg/dL 7.40(C) 1.44(Y) 1.85(U)  Sodium 135 - 145 mmol/L 137 133(L) -  Potassium 3.5 - 5.1 mmol/L 3.6 3.9 3.3(L)  Chloride 98 - 111 mmol/L 97(L) 96(L) -  CO2 22 - 32 mmol/L 34(H) 31 -  Calcium 8.9 - 10.3 mg/dL 3.1(S) 9.7(W) -         Indwelling Urinary Catheter continued, requirement due to   Reason to continue Indwelling Urinary Catheter strict Intake/Output monitoring for hemodynamic instability         Ventilator continued, requirement due to severe respiratory failure       ASSESSMENT AND PLAN   Severe ACUTE Hypoxic and Hypercapnic Respiratory Failure from end stage COPD with acute COPD exacerbation -continue Mechanical Ventilator support -continue Bronchodilator Therapy -Wean Fio2 and PEEP as tolerated -VAP/VENT bundle implementation TCT ion next 24 hrs  SEVERE COPD EXACERBATION -continue IV steroids as  prescribed -continue NEB THERAPY as prescribed -morphine as needed -wean fio2 as needed and tolerated   TRACHEITIS Completed ABX  SNF pending   GI s/p PEG GI PROPHYLAXIS as indicated  NUTRITIONAL STATUS DIET-->TF's as tolerated Constipation protocol as indicated   ELECTROLYTES -follow labs as needed -replace as needed -pharmacy consultation and following    DVT/GI PRX ordered TRANSFUSIONS AS NEEDED MONITOR FSBS ASSESS the need for LABS as needed  REMAINS SD STATUS  Lucie Leather, M.D.  [facility]  Medical Director [facility] Medical Director [facility] Cardio-Pulmonary Department

## 2019-06-29 NOTE — TOC Progression Note (Addendum)
Transition of Care [facility]) - Progression Note    Patient Details  Name: Chris Case MRN: [id number] Date of Birth: Dec 12, 1970  Transition of Care [facility]) CM/SW Contact  Liliana Cline, LCSW Phone Number: 06/29/2019, 11:30 AM  Clinical Narrative:   Per rounds, patient would be ok to discharge home with Home Health services in place. Patient has a Trilogy at home and was followed by [facility] Outpatient Palliative prior to this hospitalization.   CSW spoke to patient at bedside about SNF (if accepted at [facility]) versus Home Health. Patient alert and oriented x 4. Patient says he wants to go home. Patient reported he does not want to go to a SNF. He was agreeable to [facility] services and has no preference for agency. Patient confirmed he has his Trilogy at home and knows he needs to use it for his health.  CSW reached out to [facility] to inquire about if they can accommodate patient.   3:05- [facility] Barbara Cower unable to accept patient.  Spoke with [facility] who is unable to accept patient.  Referral sent to [facility] Representative Brittney and [facility] R[facility]. [facility] unable to [facility] patient this week, Brittney said maybe next week. [facility] unable to accept patient.     Expected Discharge Plan: Home w Home Health Services Barriers to Discharge: Continued Medical Work up  Expected Discharge Plan and Services Expected Discharge Plan: Home w Home Health Services     Post Acute Care Choice: Home Health Living arrangements for the past 2 months: Single Family Home                             HH [facility]     Representative spoke with at [facility] Agency: Feliberto Gottron   Social Determinants of Health (SDOH) Interventions    Readmission Risk Interventions Readmission Risk Prevention Plan 06/29/2019  Transportation Screening Complete  PCP or Specialist Appt within 3-5 Days Complete  HRI  or Home Care Consult Complete  Palliative Care Screening Complete  Medication Review (RN Care Manager) Complete  Some recent data might be hidden

## 2019-06-29 NOTE — Progress Notes (Signed)
PHARMACY CONSULT NOTE  Pharmacy Consult for Electrolyte Monitoring and Replacement   Recent Labs: Potassium (mmol/L)  Date Value  06/29/2019 3.6  09/24/2013 4.6   Magnesium (mg/dL)  Date Value  96/29/5284 1.7   Calcium (mg/dL)  Date Value  13/24/4010 8.4 (L)   Calcium, Total (mg/dL)  Date Value  27/25/3664 8.9   Albumin (g/dL)  Date Value  40/34/7425 3.5   Phosphorus (mg/dL)  Date Value  95/63/8756 4.1   Sodium (mmol/L)  Date Value  06/29/2019 137  09/24/2013 138     Assessment: 49 year old male admitted after being found unresponsive. Patient was intubated in the ED and transferred to the ICU. Patient started on tube feeds and at refeeding risk.  Patient extubated 4/15 am and placed on BiPAP. Patient required reintubation.   Trach 4/20, G-tube placement 4/22.  Goal of Therapy:  Electrolytes WNL  Plan:  K 3.6  Mag 1.7  Phos 4.1  Scr 0.47 -Will order Potassium chloride 40 mEq per tube. -Will order Magnesium sulfate 2 g IVPB x 1. F/u labs in am.  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  -Erythromycin 250 mg IVPB q8h initiated 0427    Chris Case  PharmD Student  06/29/2019 10:57 AM

## 2019-06-29 NOTE — Progress Notes (Signed)
Pt failed trach collar twice today. Became visibly distressed, diaphoretic, o2 drops, and accessory muscle use. Pt has been given prn meds for pain. Pt on continuous tube feedings at goal. Pt had adequate urine output with no bowel movement. Pt currently resting comfortably.

## 2019-06-29 NOTE — Progress Notes (Signed)
OT Cancellation Note  Patient Details Name: Chris Case MRN: [id number] DOB: 06-Jun-1970   Cancelled Treatment:    Reason Eval/Treat Not Completed: Other (comment). Pt with respiratory therapy upon attempt. Will re-attempt OT tx at later date/time as pt is available and medically appropriate.   Richrd Prime, MPH, MS, OTR/L ascom [phone] 06/29/19, 9:44 AM

## 2019-06-29 NOTE — Progress Notes (Signed)
Nutrition Follow-up  DOCUMENTATION CODES:   Severe malnutrition in context of chronic illness  INTERVENTION:  Continue advancing to goal TF regimen of Osmolite 1.5 Cal at 60 mL/hr (1440 mL goal daily volume). Provides 2160 kcal, 90 grams of protein, 1094 mL H2O daily.  Goal TF regimen meets 100% RDIs for vitamins/minerals.  Provide Juven 1 packet BID per tube. Each packet provides 95 kcal, 7 grams L-Arginine, 7 grams L-Glutamine, 2.5 grams collagen protein, 300 mg vitamin C, 9.5 mg zinc, and other micronutrients essential for wound healing.  Continue free water flush of 30 mL Q4hrs to maintain tube patency while patient is on IV fluids.  Once plan is to meet all hydration needs enterally per free water flush recommend a free water flush of 120 mL Q6hrs. Provides a total of 1574 mL H2O daily including water in tube feeding regimen.  Patient remains at risk for refeeding syndrome. Recommend to continue monitoring potassium, phosphorus, and magnesium daily and replace as needed.  NUTRITION DIAGNOSIS:   Severe Malnutrition related to chronic illness(COPD) as evidenced by severe fat depletion, severe muscle depletion.  Ongoing - addressing with TF regimen.  GOAL:   Patient will meet greater than or equal to 90% of their needs  Met with TF regimen.  MONITOR:   Labs, Weight trends, Vent status, TF tolerance, I & O's  REASON FOR ASSESSMENT:   Ventilator    ASSESSMENT:   49 year old male with PMHx of emphysema, COPD, hx TB admitted with acute COPD exacerbation requiring intubation.   -Patient s/p tracheostomy tube placement on 4/20. -Patient s/p robotic assisted laparoscopic G-tube placement with extensive lysis of adhesions on 4/22.  Tube feeds were resumed on 4/23. Patient was initially started at 20 mL/hr per order but he reported abdominal pain. Rate was decreased to 10 mL/hr but then later held. Tube feeds were resumed again over the weekend. Unclear if patient was advanced  to goal or not. At some point over the weekend patient asked for tube feeds to be turned off because he wanted to eat so they were turned off. Patient is not appropriate yet for assessment for oral intake per discussion on rounds. Tube feeds were restarted yesterday and patient is almost to goal. Discussed with patient at bedside and he understands that for now he will be receiving nutrition via tube feeds.  Patient is currently on ventilator support through trach MV: 8.9 L/min Temp (24hrs), Avg:98.8 F (37.1 C), Min:98.5 F (36.9 C), Max:99.1 F (37.3 C)  Medications reviewed and include: clonazepam, famotidine, senna-docusate 2 tablets BID, D10 at 50 mL/hr, erythromycin 250 mg Q8hrs IV, magnesium sulfate 2 grams IV once today.  Labs reviewed: CBG 84-119, Chloride 97, CO2 34, Creatinine 0.47.  Enteral Access: G-tube  I/O: 3525 mL UOP yesterday (2.5 mL/kg/hr)  Weight trend: 59.3 kg on 4/27; +4.1 kg from 4/13  Discussed with RN and on rounds.  Diet Order:   Diet Order            Diet NPO time specified  Diet effective midnight             EDUCATION NEEDS:   No education needs have been identified at this time  Skin:  Skin Assessment: Skin Integrity Issues:(stage 2 buttocks)  Last BM:  06/28/2019 medium type 6  Height:   Ht Readings from Last 1 Encounters:  06/15/19 '5\' 6"'  (1.676 m)   Weight:   Wt Readings from Last 1 Encounters:  06/29/19 59.3 kg   Ideal  Body Weight:  64.5 kg  BMI:  Body mass index is 21.1 kg/m.  Estimated Nutritional Needs:   Kcal:  1900-2100  Protein:  85-100 grams  Fluid:  1.6-1.9 L/day  Jacklynn Barnacle, MS, RD, LDN Pager number available on Amion

## 2019-06-29 NOTE — TOC Progression Note (Addendum)
Transition of Care [facility]) - Progression Note    Patient Details  Name: Dallen Bunte MRN: [id number] Date of Birth: 1970-12-20  Transition of Care [facility]) CM/SW Contact  Liliana Cline, LCSW Phone Number: 06/29/2019, 8:22 AM  Clinical Narrative:      CSW attempted call to Admissions Worker Herbert Seta at [facility]. No answer. Called back and asked if there are any other representatives I can speak with. Spoke with Camelia Eng who reported they may be able to accept patient with trach & trilogy. Informed Terri that patient has Medicaid. CSW faxed information to Terri for facility to review. Fax # [phone]  CSW also faxed patient's information to SNFs on [facility] although they do not typically accept trilogys.         Expected Discharge Plan and Services                                                 Social Determinants of Health (SDOH) Interventions    Readmission Risk Interventions No flowsheet data found.

## 2019-06-29 NOTE — Progress Notes (Signed)
SLP Cancellation Note  Patient Details Name: Chris Case MRN: [id number] DOB: 1971/01/02   Cancelled treatment:       Reason Eval/Treat Not Completed: Medical issues which prohibited therapy;Patient not medically ready(chart reviewed; consulted RT, NSG then met w/ pt). Currently, pt is on full vent support. He is unable to wean to trach collar at this time for PMV assessment. Per RT, pt tolerated ~12 hours of TC wean yesterday but then became too fatigued when attempting to work w/ another discipline at the same time. Discussed w/ pt the plan for weaning again in slower increments (per RT). When he is able to tolerate ~4 hours w/out event, then PMV assessment would be appropriate. Explained the need for building stamina during a wean; pt agreed. ST services will f/u tomorrow. NSG updated.  Pt has a communication board in room for writing to communicate(dry erase bd). Pt is able to mouth and use gestures as well. No apparent language deficits noted. Recommend frequent oral care for hygiene and stimulation of swallowing.     Orinda Kenner, MS, CCC-SLP Chris Case 06/29/2019, 11:09 AM

## 2019-06-29 NOTE — Progress Notes (Addendum)
CH visited pt. while rounding in ICU; pt. lying down in bed, trach connected to vent.  In [facility] memory, pt. not on vent yesterday --pt. seemed to confirm this impression by pointing several times ruefully to ventilator.  Despite apparent setback, needing vent support, pt. appeared to be in good spirits.  Speech still difficult to understand due to trach; pt. said today he would like to pray for [facility] --CH has many people to care for and pt. wanted to pray for him.  CH agreed gratefully; pt. offered prayer for [facility] and ministry to patients.  CH offered prayer for pt. as well.  Ch grateful for pt.'s prayers.  Will continue to monitor pt. needs as able.    06/29/19 1100  Clinical Encounter Type  Visited With Patient  Visit Type Follow-up;Spiritual support;Social support;Critical Care  Referral From Patient  Spiritual Encounters  Spiritual Needs Prayer;Emotional  Stress Factors  Patient Stress Factors Loss of control

## 2019-06-29 NOTE — Plan of Care (Signed)
  Problem: Education: Goal: Knowledge of General Education information will improve Description: Including pain rating scale, medication(s)/side effects and non-pharmacologic comfort measures Outcome: Progressing   Problem: Health Behavior/Discharge Planning: Goal: Ability to manage health-related needs will improve Outcome: Progressing   Problem: Clinical Measurements: Goal: Will remain free from infection Outcome: Progressing Goal: Diagnostic test results will improve Outcome: Progressing Goal: Cardiovascular complication will be avoided Outcome: Progressing   Problem: Nutrition: Goal: Adequate nutrition will be maintained Outcome: Progressing   Problem: Elimination: Goal: Will not experience complications related to bowel motility Outcome: Progressing Goal: Will not experience complications related to urinary retention Outcome: Progressing   Problem: Safety: Goal: Ability to remain free from injury will improve Outcome: Progressing   Problem: Skin Integrity: Goal: Risk for impaired skin integrity will decrease Outcome: Progressing   Problem: Role Relationship: Goal: Method of communication will improve Outcome: Progressing   Problem: Education: Goal: Knowledge of disease or condition will improve Outcome: Progressing Goal: Knowledge of the prescribed therapeutic regimen will improve Outcome: Progressing Goal: Individualized Educational Video(s) Outcome: Progressing   Problem: Activity: Goal: Will verbalize the importance of balancing activity with adequate rest periods Outcome: Progressing   Problem: Respiratory: Goal: Ability to maintain a clear airway will improve Outcome: Progressing   Problem: Clinical Measurements: Goal: Ability to maintain clinical measurements within normal limits will improve Outcome: Not Progressing Goal: Respiratory complications will improve Outcome: Not Progressing   Problem: Pain Managment: Goal: General experience of  comfort will improve Outcome: Not Progressing   Problem: Activity: Goal: Ability to tolerate increased activity will improve Outcome: Not Progressing   Problem: Respiratory: Goal: Ability to maintain a clear airway and adequate ventilation will improve Outcome: Not Progressing   Problem: Activity: Goal: Ability to tolerate increased activity will improve Outcome: Not Progressing   Problem: Respiratory: Goal: Levels of oxygenation will improve Outcome: Not Progressing Goal: Ability to maintain adequate ventilation will improve Outcome: Not Progressing

## 2019-06-30 ENCOUNTER — Inpatient Hospital Stay: Payer: Medicaid Other

## 2019-06-30 DIAGNOSIS — R4189 Other symptoms and signs involving cognitive functions and awareness: Secondary | ICD-10-CM | POA: Diagnosis not present

## 2019-06-30 DIAGNOSIS — J441 Chronic obstructive pulmonary disease with (acute) exacerbation: Secondary | ICD-10-CM | POA: Diagnosis not present

## 2019-06-30 LAB — CBC WITH DIFFERENTIAL/PLATELET
Abs Immature Granulocytes: 0.24 10*3/uL — ABNORMAL HIGH (ref 0.00–0.07)
Basophils Absolute: 0 10*3/uL (ref 0.0–0.1)
Basophils Relative: 0 %
Eosinophils Absolute: 0.3 10*3/uL (ref 0.0–0.5)
Eosinophils Relative: 3 %
HCT: 29.5 % — ABNORMAL LOW (ref 39.0–52.0)
Hemoglobin: 9.5 g/dL — ABNORMAL LOW (ref 13.0–17.0)
Immature Granulocytes: 2 %
Lymphocytes Relative: 20 %
Lymphs Abs: 2.3 10*3/uL (ref 0.7–4.0)
MCH: 30.3 pg (ref 26.0–34.0)
MCHC: 32.2 g/dL (ref 30.0–36.0)
MCV: 93.9 fL (ref 80.0–100.0)
Monocytes Absolute: 0.8 10*3/uL (ref 0.1–1.0)
Monocytes Relative: 7 %
Neutro Abs: 8 10*3/uL — ABNORMAL HIGH (ref 1.7–7.7)
Neutrophils Relative %: 68 %
Platelets: 172 10*3/uL (ref 150–400)
RBC: 3.14 MIL/uL — ABNORMAL LOW (ref 4.22–5.81)
RDW: 13 % (ref 11.5–15.5)
WBC: 11.7 10*3/uL — ABNORMAL HIGH (ref 4.0–10.5)
nRBC: 0 % (ref 0.0–0.2)

## 2019-06-30 LAB — BASIC METABOLIC PANEL
Anion gap: 8 (ref 5–15)
BUN: 15 mg/dL (ref 6–20)
CO2: 30 mmol/L (ref 22–32)
Calcium: 8.1 mg/dL — ABNORMAL LOW (ref 8.9–10.3)
Chloride: 96 mmol/L — ABNORMAL LOW (ref 98–111)
Creatinine, Ser: 0.51 mg/dL — ABNORMAL LOW (ref 0.61–1.24)
GFR calc Af Amer: >60 mL/min (ref >60)
GFR calc non Af Amer: >60 mL/min (ref >60)
Glucose, Bld: 103 mg/dL — ABNORMAL HIGH (ref 70–99)
Potassium: 4.3 mmol/L (ref 3.5–5.1)
Sodium: 134 mmol/L — ABNORMAL LOW (ref 135–145)

## 2019-06-30 LAB — CORTISOL: Cortisol, Plasma: 25.6 ug/dL

## 2019-06-30 LAB — GLUCOSE, CAPILLARY
Glucose-Capillary: 108 mg/dL — ABNORMAL HIGH (ref 70–99)
Glucose-Capillary: 109 mg/dL — ABNORMAL HIGH (ref 70–99)
Glucose-Capillary: 123 mg/dL — ABNORMAL HIGH (ref 70–99)
Glucose-Capillary: 72 mg/dL (ref 70–99)
Glucose-Capillary: 92 mg/dL (ref 70–99)
Glucose-Capillary: 95 mg/dL (ref 70–99)
Glucose-Capillary: 95 mg/dL (ref 70–99)

## 2019-06-30 LAB — MAGNESIUM: Magnesium: 2.1 mg/dL (ref 1.7–2.4)

## 2019-06-30 LAB — PHOSPHORUS: Phosphorus: 3.6 mg/dL (ref 2.5–4.6)

## 2019-06-30 MED ORDER — SODIUM CHLORIDE 4 MEQ/ML IV SOLN
INTRAVENOUS | Status: DC
  Filled 2019-06-30 (×11): qty 1000

## 2019-06-30 MED ORDER — PREDNISONE 10 MG PO TABS
10.0000 mg | ORAL_TABLET | Freq: Every day | ORAL | Status: DC
  Administered 2019-06-30: 10 mg via ORAL
  Filled 2019-06-30: qty 1

## 2019-06-30 NOTE — Progress Notes (Signed)
I have visited this patient several times, today he waved me in. He has a trach but was able to communicate with me very well. He told me of therapy and his hopeful recovery. I prayed with him for which he was grateful and thanked me several times. I assured him I would check on him again.

## 2019-06-30 NOTE — Progress Notes (Signed)
Pt placed on 5L 28% trach collar at this time. Pt ol well, will continue to monitor pt carefully.

## 2019-06-30 NOTE — TOC Progression Note (Addendum)
Transition of Care [facility]) - Progression Note    Patient Details  Name: Chris Case MRN: [id number] Date of Birth: February 10, 1971  Transition of Care [facility]) CM/SW Contact  Liliana Cline, LCSW Phone Number: 06/30/2019, 9:06 AM  Clinical Narrative:   Still working on trying to find Home Health for patient. [facility] sent to [facility] They both reported they cannot accommodate patient.  Per MD patient should be ready to discharge early next week. Asked [facility] Cower if he would be able to accommodate patient for Home Health next week. He is checking.  Per rounds, roommate reported patient does not have a Trilogy at home, only a Bipap. CSW was informed by [facility] Representative [name] that patient has a Midwife. CSW reached out to [facility] to confirm that patient does have a Trilogy at home. Waiting for confirmation from [facility].   11:00- Heard from [facility] to confirm patient does have a Trilogy at home. Updated MD and Respiratory Therapist.   1:00- Followed up with [facility] who reports they may be able to accept patient if he is more medically stable next week, they would not be able to at this point. Barbara Cower talked to patient at bedside regarding not being able to accept him at his current condition. Per RT, roommate is supposed to be visiting today to talk to patient about SNF and try to convince him (if any SNF will take him with Trilogy + trach). CSW asked to be informed if roommate visits today. CSW also attempted a call to roommate to touch base, she did not answer.   Expected Discharge Plan: Home w Home Health Services Barriers to Discharge: Continued Medical Work up  Expected Discharge Plan and Services Expected Discharge Plan: Home w Home Health Services     Post Acute Care Choice: Home Health Living arrangements for the past 2 months: Single Family Home                             HH [facility]      Representative spoke with at [facility] Agency: [facility] Gottron   Social Determinants of Health (SDOH) Interventions    Readmission Risk Interventions Readmission Risk Prevention Plan 06/29/2019  Transportation Screening Complete  PCP or Specialist Appt within 3-5 Days Complete  HRI or Home Care Consult Complete  Palliative Care Screening Complete  Medication Review (RN Care Manager) Complete  Some recent data might be hidden

## 2019-06-30 NOTE — Progress Notes (Signed)
CRITICAL CARE NOTE  49 year old former smoker with a history very severe COPD stage IV, who presented via EMS after being found down and unresponsive.  Apparently the patient's mother placed a call to EMS.  The patient has a longstanding history of COPD and noncompliance with his oxygen and CPAP.  History could not be obtained from the patient as there was no family available and the patient had been intubated and was mechanically ventilated in the ED.  Of note he had recently been admitted to [facility] 29 May 2019 through 03 June 2019.  He had been evaluated by palliative care during this admission and actually had a DNR order.   Patient also had a 2D echo  with pulmonary hypertension consistent with cor pulmonale.  Records review: Patient has been following with Dr. Wallene Huh at [facility].  Last known PFTs 11 September 2017.  FEV1 0.52 L or 20% predicted, FVC 1.49 L or 45% of predicted, FEV1/FVC 35%.  FEF 25-75 6%.  DLCO was 33%.  This is consistent with very severe COPD.  Events: 4/13 admitted for severe resp failure from COPD 4/14 remains critically ill on vent 4/15 remains critically ill, severe COPD, failed SAT/SBT 4/16 extubated yesterday, had to be reintubated remains on ventilator 4/17 sedated on the ventilator 4/18 abdominal distention, CT abdomen and pelvis consistent with ileus and constipation 4/19-patietn remains on ventialtor awainting trach 4/20 - patient is on vent with trach, he had episodes of aggitation today but eventually improved  4/21 - patient with significant distension of abdomen, concern for aerophagia in NIV.  There is mucopurulent exudation from trache this was cultures and patient placed on Unasyn for tracheitis 06/24/19 -trache site improved with less malodorous pus draining, NGT with decompression worked well abdomen is less distended. S/p PEG 06/25/19- patient had some anxiety throughout day today, we have started low dose anxiolytic. His PEG is now being  utilized. He has some placement issues with social work due to insurance and we are working on this now.  06/26/19-Patient had no events today, we are waiting for placement now from transitional care team 06/27/19- Patient is calm this morining, abdomen is mildy distended, awaiting placement to [facility] 4/26 plan for PS/TCT 4/27 Pt failed trach collar twice today. Became visibly distressed, diaphoretic, o2 drops, and accessory muscle use.  4/28 increased WOB, resp distress, using accessory muscles to breath while on vent  CC  Follow up severe resp failure End stage COPD  HPI +RESP DISTRESS +INCREASED WOB +USING ACCESSORY MUSCLES TO BREATHE PATIENT  CRITICALLY ILL ON FULL VENT SUPPORT NEEDS SEDATIVES     BP 102/77   Pulse 83   Temp 98.8 F (37.1 C) (Oral)   Resp 18   Ht 5\' 6"  (1.676 m)   Wt 60.3 kg   SpO2 (!) 88%   BMI 21.46 kg/m    I/O last 3 completed shifts: In: 1610 [I.V.:1849.9; Other:50; NG/GT:1882.3; IV Piggyback:665.8] Out: 2900 [Urine:2900] No intake/output data recorded.  SpO2: (!) 88 % O2 Flow Rate (L/min): 5 L/min FiO2 (%): 21 %  Estimated body mass index is 21.46 kg/m as calculated from the following:   Height as of this encounter: 5\' 6"  (1.676 m).   Weight as of this encounter: 60.3 kg.   REVIEW OF SYSTEMS  PATIENT IS UNABLE TO PROVIDE COMPLETE REVIEW OF SYSTEM S DUE TO SEVERE CRITICAL ILLNESS    PHYSICAL EXAMINATION:  GENERAL:critically ill appearing, +resp distress HEAD: Normocephalic, atraumatic.  EYES: Pupils equal, round, reactive to light.  No scleral icterus.  MOUTH: Moist mucosal membrane. NECK: Supple. No thyromegaly. No nodules. No JVD.  PULMONARY: +rhonchi, +wheezing CARDIOVASCULAR: S1 and S2. Regular rate and rhythm. No murmurs, rubs, or gallops.  GASTROINTESTINAL: Soft, nontender, -distended. Positive bowel sounds.  MUSCULOSKELETAL: No swelling, clubbing, or edema.  NEUROLOGIC: agitated SKIN:intact,warm,dry    ALL OTHER ROS ARE  NEGATIVE      MEDICATIONS: I have reviewed all medications and confirmed regimen as documented   CULTURE RESULTS   Recent Results (from the past 240 hour(s))  Aerobic Culture (superficial specimen)     Status: None   Collection Time: 06/23/19  4:55 PM   Specimen: Neck  Result Value Ref Range Status   Specimen Description   Final    NECK Performed at [facility], [address], [place], [place] [address]    Special Requests   Final    NONE Performed at [facility], [address] [place], [place] [address]    Gram Stain   Final    NO WBC SEEN ABUNDANT GRAM POSITIVE COCCI ABUNDANT GRAM NEGATIVE RODS ABUNDANT GRAM POSITIVE RODS    Culture   Final    NORMAL OROPHARYNGEAL FLORA Performed at [facility], [address] [place], [place] [address]    Report Status 06/26/2019 FINAL  Final        BMP Latest Ref Rng & Units 06/30/2019 06/29/2019 06/28/2019  Glucose 70 - 99 mg/dL 950(D) 326(Z) 124(P)  BUN 6 - 20 mg/dL 15 6 7   Creatinine 0.61 - 1.24 mg/dL ) 8.09(X) 8.33(A)  Sodium 135 - 145 mmol/L 134(L) 137 133(L)  Potassium 3.5 - 5.1 mmol/L 4.3 3.6 3.9  Chloride 98 - 111 mmol/L 96(L) 97(L) 96(L)  CO2 22 - 32 mmol/L 30 34(H) 31  Calcium 8.9 - 10.3 mg/dL 8.1(L) 8.4(L) 8.5(L)         Indwelling Urinary Catheter continued, requirement due to   Reason to continue Indwelling Urinary Catheter strict Intake/Output monitoring for hemodynamic instability         Ventilator continued, requirement due to severe respiratory failure      ASSESSMENT AND PLAN   Severe ACUTE Hypoxic and Hypercapnic Respiratory Failure from end stage COPD with acute COPD exacerbation -continue Mechanical Ventilator support -continue Bronchodilator Therapy -Wean Fio2 and PEEP as tolerated -VAP/VENT bundle implementation Unable to wean from VENT, patient with severe resp distress  SEVERE COPD EXACERBATION -continue IV steroids as prescribed -continue  NEB THERAPY as prescribed -morphine as needed -wean fio2 as needed and tolerated   GI GI PROPHYLAXIS as indicated  NUTRITIONAL STATUS DIET-->TF's as tolerated +abd pain Constipation protocol as indicated  ELECTROLYTES -follow labs as needed -replace as needed -pharmacy consultation and following     DVT/GI PRX ordered TRANSFUSIONS AS NEEDED MONITOR FSBS ASSESS the need for LABS as needed    Critical Care Time devoted to patient care services described in this note is 35 minutes.   Overall, patient is critically ill, prognosis is guarded.  Patient with Multiorgan failure and at high risk for cardiac arrest and death.    2.50(N, M.D.  [facility]  Medical Director [facility] Medical Director [facility] Cardio-Pulmonary Department

## 2019-06-30 NOTE — Progress Notes (Signed)
PT Cancellation Note  Patient Details Name: Chris Case MRN: [id number] DOB: May 12, 1970   Cancelled Treatment:    Reason Eval/Treat Not Completed: Other (comment).  Chart reviewed.  Per discussion with pt's nurse and respiratory therapy, recommending holding PT at this time so pt does not fatigue before able to be seen by speech therapy at 12:30 (pt needs to make it 4 hours on trach collar to trial speaking valve).  Will re-attempt PT treatment session at a later date/time as able.  Hendricks Limes, PT 06/30/19, 10:50 AM

## 2019-06-30 NOTE — Progress Notes (Signed)
PHARMACY CONSULT NOTE  Pharmacy Consult for Electrolyte Monitoring and Replacement   Recent Labs: Potassium (mmol/L)  Date Value  06/30/2019 4.3  09/24/2013 4.6   Magnesium (mg/dL)  Date Value  60/47/9987 2.1   Calcium (mg/dL)  Date Value  21/58/7276 8.1 (L)   Calcium, Total (mg/dL)  Date Value  18/48/5927 8.9   Albumin (g/dL)  Date Value  63/94/3200 3.5   Phosphorus (mg/dL)  Date Value  37/94/4461 3.6   Sodium (mmol/L)  Date Value  06/30/2019 134 (L)  09/24/2013 138     Assessment: 49 year old male admitted after being found unresponsive. Patient was intubated in the ED and transferred to the ICU. Patient started on tube feeds and at refeeding risk.  Patient extubated 4/15 am and placed on BiPAP. Patient required reintubation.   Trach 4/20, G-tube placement 4/22.  Goal of Therapy:  Electrolytes WNL  Plan:  No replacement warranted.   BMP with am labs.   Pharmacy will continue to monitor and adjust per consult.   06/30/2019 6:11 PM

## 2019-06-30 NOTE — Progress Notes (Signed)
Occupational Therapy Treatment Patient Details Name: Chris Case MRN: [id number] DOB: [birth year] Today's Date: 06/30/2019    History of present illness Chris Case is a 40yoM who comes to [facility] on 4/13 after being found unresponsive. Pt here 29 May 2019 through 03 June 2019. Pt required ventilation upon arrival, failed extubation on 4/16, transitioned to tach on 4/20. Pt has required NGT decompression d/t ABD distension, underwent  PEG placement and lysis adhesions 4/22. 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   OT comments  Pt seen for OT treatment this date to f/u re: safety with self care ADLs and strengthening for fxl activity tolerance. OT engages pt in bed level g/h tasks in which pt only requires setup and MIN verbal cues to sequence (somewhat impulsive). In addition, OT engages pt in below-named bed-level exercise. Pt tolerates well for the most part, does require 2-3 minute rest breaks between each exercise. Anticipate LTACH is still most appropriate d/c recommendation.    Follow Up Recommendations  LTACH;SNF    Equipment Recommendations  3 in 1 bedside commode    Recommendations for Other Services      Precautions / Restrictions Precautions Precautions: Fall;Other (comment) Precaution Comments: trach, vent Restrictions Weight Bearing Restrictions: No       Mobility Bed Mobility                  Transfers                 General transfer comment: deferred today, RT states pt tolerated trach collar about 4 hours and is now back on vent.    Balance                                           ADL either performed or assessed with clinical judgement   ADL Overall ADL's : Needs assistance/impaired     Grooming: Oral care;Set up;Bed level                                       Vision Patient Visual Report: No change from baseline     Perception     Praxis       Cognition Arousal/Alertness: Awake/alert Behavior During Therapy: WFL for tasks assessed/performed Overall Cognitive Status: Within Functional Limits for tasks assessed                                          Exercises Other Exercises Other Exercises: OT engages pt in the following therex to improve strength fxl activity tolerance, and aerbic endurance as it applies to safely completing meaningful occupation:  2 sets x20 reps FWD punches, 1 set x15 reps followed by 15 pulses straight arm raises, 1 set x20 reps modified bed level crunch. Pt tolerates well overall, does require extended rest break after crunches. <10% cues req'ed for form/pace.   Shoulder Instructions       General Comments      Pertinent Vitals/ Pain       Pain Assessment: No/denies pain  Home Living  Prior Functioning/Environment              Frequency  Min 2X/week        Progress Toward Goals  OT Goals(current goals can now be found in the care plan section)  Progress towards OT goals: Progressing toward goals  Acute Rehab OT Goals Patient Stated Goal: be independent again OT Goal Formulation: With patient Time For Goal Achievement: 07/09/19 Potential to Achieve Goals: Good  Plan      Co-evaluation                 AM-PAC OT "6 Clicks" Daily Activity     Outcome Measure   Help from another person eating meals?: None Help from another person taking care of personal grooming?: None Help from another person toileting, which includes using toliet, bedpan, or urinal?: A Lot Help from another person bathing (including washing, rinsing, drying)?: A Lot Help from another person to put on and taking off regular upper body clothing?: A Lot Help from another person to put on and taking off regular lower body clothing?: A Lot 6 Click Score: 16    End of Session Equipment Utilized During Treatment: Gait  belt;Oxygen(on vent at this time)  OT Visit Diagnosis: Other abnormalities of gait and mobility (R26.89);Muscle weakness (generalized) (M62.81);Pain Pain - part of body: (abd and trach site)   Activity Tolerance Patient tolerated treatment well   Patient Left in bed;with call bell/phone within reach   Nurse Communication          Time: 3295-1884 OT Time Calculation (min): 23 min  Charges: OT General Charges $OT Visit: 1 Visit OT Treatments $Self Care/Home Management : 8-22 mins $Therapeutic Exercise: 8-22 mins  Rejeana Brock, MS, OTR/L ascom [phone] 06/30/19, 4:51 PM

## 2019-06-30 NOTE — Evaluation (Signed)
Passy-Muir Speaking Valve - Evaluation Patient Details  Name: Chris Case MRN: [id number] Date of Birth: Aug 22, 1970  Today's Date: 06/30/2019 Time: 1300-1400 SLP Time Calculation (min) (ACUTE ONLY): 60 min  Past Medical History:  Past Medical History:  Diagnosis Date  . COPD (chronic obstructive pulmonary disease) ([place])   . Emphysema lung ([place])   . 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 APPENDECTOMY  late 1980's  . GASTROSTOMY N/A 06/24/2019   Procedure: INSERTION OF GASTROSTOMY TUBE ROBOT ASSISTED;  Surgeon: Jules Husbands, MD;  Location: [facility] ORS;  Service: General;  Laterality: N/A;  . LAPAROTOMY N/A 12/06/2016   Procedure: EXPLORATORY LAPAROTOMY- SBO;  Surgeon: Florene Glen, MD;  Location: [facility] ORS;  Service: General;  Laterality: N/A;  . TRACHEOSTOMY TUBE PLACEMENT N/A 06/22/2019   Procedure: TRACHEOSTOMY;  Surgeon: Carloyn Manner, MD;  Location: [facility] ORS;  Service: ENT;  Laterality: N/A;   HPI:  Pt is a 49 year old former smoker with a history very severe COPD stage IV, who presented via EMS after being found down and unresponsive.  Apparently the patient's mother placed a call to EMS.  The patient has a longstanding history of COPD and noncompliance with his oxygen and CPAP.  History could not be obtained from the patient as there was no family available and the patient had been intubated and was mechanically ventilated in the ED.  Of note, he had recently been admitted to [facility] 29 May 2019 through 03 June 2019.  Patient s/p tracheostomy tube placement on 4/20;  s/p robotic assisted laparoscopic G-tube placement with extensive lysis of adhesions on 4/22.  Severe Malnutrition related to chronic illness(COPD) as evidenced by severe muscle and fat depletion per Dietician note.    Assessment / Plan / Recommendation Clinical Impression  Pt seen today for PMV evaluation. Pt is s/p  Tracheostomy tube placement on 06/22/2019 post admission to Hospital for acute COPD exacerbation requiring oral intubation. Pt is also s/p PEG placement for nutrition/hydration needs. Pt has been tolerating vent weaning though fatigued and needed a day of rest when initial wean went too long ~2 days ago(also engaged in physical activity toward end of wean). Today, pt has been on Trach Collar(TC) wean for ~5 hours and tolerating well per RT. O2 sats in the upper 90s, HR/RR stable. Trach Cuff DEFLATED at baseline during this wean. Pt has a #8 Shiley trach. Finger occlusion trials were adequate w/ pt exhibiting phonation, verbalizations; no decline in O2 sats. Pt was explained the PMV use/wear and was eager to try it. PMV placed w/out difficulty. Pt was immediately able to attain adequate phonation and verbalizations at the short phrase level -- noted decreased breath support for longer sentences. Pt tolerated the PMV placement for ~45 mins w/ O2 sats remaining 96-97%, HR 90s, RR 17-22bpm. Pt's volume was min decreased w/ min effort/breathiness present -- suspect impacted the size trach(shiley #8). Will contact MD re: potential downsizing of trach to a Shiley #6 as soon as possible. Pt stated he felt "comfortable" in his breathing and verbal communication w/ others during the session. RT and NSG continued to monitor pt's wear of PMV post session as they held discussion re: his home vent. Brief Education given on cleaning and storage; precautions re: use/wear of PMV posted in room, chart orders. NSG aware. Pt and family/caregivers would benefit from ST services for further education on PMV wear and  precautions for care to help ensure safety w/ PMV use while admitted. ST services will f/u tomorrow.  SLP Visit Diagnosis: Aphonia (R49.1)(tracheostomy)    SLP Assessment  Patient needs continued Speech Lanaguage Pathology Services    Follow Up Recommendations  (TBD)    Frequency and Duration min 2x/week  1 week     PMSV Trial PMSV was placed for: ~45 mins Able to redirect subglottic air through upper airway: Yes Able to Attain Phonation: Yes Voice Quality: Low vocal intensity Able to Expectorate Secretions: Yes Level of Secretion Expectoration with PMSV: Oral Breath Support for Phonation: Adequate(pt stated he felt comfortable) Intelligibility: Intelligible Respirations During Trial: 19 SpO2 During Trial: 97 % Pulse During Trial: 93 Behavior: Alert;Cooperative;Expresses self well;Good eye contact;Responsive to questions   Tracheostomy Tube  Additional Tracheostomy Tube Assessment Trach Collar Period: ~5 hours(pt placed on TC at 0830 by RT this morning) Secretion Description: min Frequency of Tracheal Suctioning: PRN Level of Secretion Expectoration: Tracheal    Vent Dependency  Vent Dependent: Yes(TC wean trials) Vent Mode: PSV PEEP: 5 cmH20 Pressure Support: 10 cmH20 FiO2 (%): 28 % Weaning Trials: Yes    Cuff Deflation Trial  GO Tolerated Cuff Deflation: Yes Length of Time for Cuff Deflation Trial: 5 hours Behavior: Alert;Cooperative;Expresses self well;Good eye contact;Oriented X3;Responsive to questions;Smiling Cuff Deflation Trial - Comments: tolerating cuff deflation during TC wean of ~5 hours today; pt able to use finger occlusion for verbal communication w/ others        Corey Caulfield 06/30/2019, 4:56 PM Jerilynn Som, MS, CCC-SLP

## 2019-07-01 DIAGNOSIS — R4189 Other symptoms and signs involving cognitive functions and awareness: Secondary | ICD-10-CM | POA: Diagnosis not present

## 2019-07-01 DIAGNOSIS — J441 Chronic obstructive pulmonary disease with (acute) exacerbation: Secondary | ICD-10-CM | POA: Diagnosis not present

## 2019-07-01 LAB — CBC WITH DIFFERENTIAL/PLATELET
Abs Immature Granulocytes: 0.14 10*3/uL — ABNORMAL HIGH (ref 0.00–0.07)
Basophils Absolute: 0 10*3/uL (ref 0.0–0.1)
Basophils Relative: 0 %
Eosinophils Absolute: 0.2 10*3/uL (ref 0.0–0.5)
Eosinophils Relative: 2 %
HCT: 28.2 % — ABNORMAL LOW (ref 39.0–52.0)
Hemoglobin: 9.2 g/dL — ABNORMAL LOW (ref 13.0–17.0)
Immature Granulocytes: 2 %
Lymphocytes Relative: 16 %
Lymphs Abs: 1.5 10*3/uL (ref 0.7–4.0)
MCH: 31 pg (ref 26.0–34.0)
MCHC: 32.6 g/dL (ref 30.0–36.0)
MCV: 94.9 fL (ref 80.0–100.0)
Monocytes Absolute: 0.7 10*3/uL (ref 0.1–1.0)
Monocytes Relative: 7 %
Neutro Abs: 6.8 10*3/uL (ref 1.7–7.7)
Neutrophils Relative %: 73 %
Platelets: 171 10*3/uL (ref 150–400)
RBC: 2.97 MIL/uL — ABNORMAL LOW (ref 4.22–5.81)
RDW: 13 % (ref 11.5–15.5)
WBC: 9.4 10*3/uL (ref 4.0–10.5)
nRBC: 0 % (ref 0.0–0.2)

## 2019-07-01 LAB — MAGNESIUM: Magnesium: 1.9 mg/dL (ref 1.7–2.4)

## 2019-07-01 LAB — BASIC METABOLIC PANEL
Anion gap: 5 (ref 5–15)
BUN: 12 mg/dL (ref 6–20)
CO2: 33 mmol/L — ABNORMAL HIGH (ref 22–32)
Calcium: 8.3 mg/dL — ABNORMAL LOW (ref 8.9–10.3)
Chloride: 100 mmol/L (ref 98–111)
Creatinine, Ser: 0.48 mg/dL — ABNORMAL LOW (ref 0.61–1.24)
GFR calc Af Amer: >60 mL/min (ref >60)
GFR calc non Af Amer: >60 mL/min (ref >60)
Glucose, Bld: 108 mg/dL — ABNORMAL HIGH (ref 70–99)
Potassium: 4.3 mmol/L (ref 3.5–5.1)
Sodium: 138 mmol/L (ref 135–145)

## 2019-07-01 LAB — THYROID PANEL WITH TSH
Free Thyroxine Index: 1.8 (ref 1.2–4.9)
T3 Uptake Ratio: 29 % (ref 24–39)
T4, Total: 6.2 ug/dL (ref 4.5–12.0)
TSH: 2.69 u[IU]/mL (ref 0.450–4.500)

## 2019-07-01 LAB — INSULIN AND C-PEPTIDE, SERUM
C-Peptide: 3.9 ng/mL (ref 1.1–4.4)
Insulin: 9.7 u[IU]/mL (ref 2.6–24.9)

## 2019-07-01 LAB — GLUCOSE, CAPILLARY
Glucose-Capillary: 100 mg/dL — ABNORMAL HIGH (ref 70–99)
Glucose-Capillary: 105 mg/dL — ABNORMAL HIGH (ref 70–99)
Glucose-Capillary: 107 mg/dL — ABNORMAL HIGH (ref 70–99)
Glucose-Capillary: 128 mg/dL — ABNORMAL HIGH (ref 70–99)
Glucose-Capillary: 128 mg/dL — ABNORMAL HIGH (ref 70–99)

## 2019-07-01 LAB — T3: T3, Total: 81 ng/dL (ref 71–180)

## 2019-07-01 LAB — PHOSPHORUS: Phosphorus: 3.3 mg/dL (ref 2.5–4.6)

## 2019-07-01 MED ORDER — POLYETHYLENE GLYCOL 3350 17 G PO PACK
17.0000 g | PACK | Freq: Every day | ORAL | Status: DC
  Administered 2019-07-05 – 2019-07-06 (×2): 17 g via ORAL
  Filled 2019-07-01 (×2): qty 1

## 2019-07-01 MED ORDER — MIDAZOLAM HCL 2 MG/2ML IJ SOLN: 2.0000 mg | Freq: Once | INTRAMUSCULAR | Status: DC

## 2019-07-01 MED ORDER — MIDAZOLAM HCL 2 MG/2ML IJ SOLN
INTRAMUSCULAR | Status: AC
  Filled 2019-07-01: qty 2

## 2019-07-01 MED ORDER — MAGNESIUM SULFATE IN D5W 1-5 GM/100ML-% IV SOLN
1.0000 g | Freq: Once | INTRAVENOUS | Status: AC
  Administered 2019-07-01: 1 g via INTRAVENOUS
  Filled 2019-07-01 (×2): qty 100

## 2019-07-01 MED ORDER — HYDROCORTISONE NA SUCCINATE PF 100 MG IJ SOLR
50.0000 mg | Freq: Four times a day (QID) | INTRAMUSCULAR | Status: DC
  Administered 2019-07-01 – 2019-07-02 (×4): 50 mg via INTRAVENOUS
  Filled 2019-07-01 (×4): qty 2

## 2019-07-01 NOTE — Progress Notes (Signed)
  Speech Language Pathology Treatment: [place] Speaking valve  Patient Details Name: Chris Case MRN: [id number] DOB: May 06, 1970 Today's Date: 07/01/2019 Time: 0825-0900 SLP Time Calculation (min) (ACUTE ONLY): 35 min  Assessment / Plan / Recommendation Clinical Impression  Pt seen this morning for ongoing assessment and education w/ PMV use/care. Pt recently transitioned from vent (overnight) to the trach collar O2 support then to [place] O2 support per RT ok for ease of PMV tx w/ trials or oral intake. Pt awake, alert and verbally conversive w/ staff in room. RT reported no difficulty w/ trach Cuff Deflation and PMV placement upon this SLP entering room soon after. O2 sats 99%, RR 17, HR 91.  Pt has a #8 Shiley trach. Pt exhibited phonation, verbalizations, and communication at the short phrase/sentence levels w/ no decline in O2 sats. SPLP noted min decreased breath support for longer sentences and min inspiratory effort for breath support for speech. Pt stated he was Not in discomfort re: breathing w/ PMV placed. He tolerated the PMV placement for ~45 mins total tome w/ O2 sats remaining 98%, HR 90s, RR 17-21bpm. Pt's volume was min decreased w/ min effort/breathiness present -- suspect impacted the size trach(Shiley #8). Contacted MD re: potential downsizing of trach to a Shiley #6 as soon as possible. RT and NSG to continue to monitor pt's wear of PMV post session. Education given on cleaning and storage; O2 support w/ PMV placed; precautions re: use/wear of PMV posted in room, chart orders. NSG aware. Pt and family/caregivers would benefit from ST services for further education on PMV wear and precautions for care to help ensure safety w/ PMV use while admitted. ST services will f/u tomorrow.      HPI HPI: Pt is a 49 year old former smoker with a history very severe COPD stage IV, who presented via EMS after being found down and unresponsive.  Apparently the patient's mother placed a call to EMS.   The patient has a longstanding history of COPD and noncompliance with his oxygen and CPAP.  History could not be obtained from the patient as there was no family available and the patient had been intubated and was mechanically ventilated in the ED.  Of note, he had recently been admitted to [facility] 29 May 2019 through 03 June 2019.  Patient s/p tracheostomy tube placement on 4/20;  s/p robotic assisted laparoscopic G-tube placement with extensive lysis of adhesions on 4/22.  Severe Malnutrition related to chronic illness(COPD) as evidenced by severe muscle and fat depletion per Dietician note.       SLP Plan  Continue with current plan of care(for PMV)       Recommendations  Medication Administration: (PEG placement)      Patient may use Passy-Muir Speech Valve: Intermittently with supervision;During PO intake/meals(until downsize assessment/change) PMSV Supervision: Intermittent MD: Please consider changing trach tube to : Smaller size(Shiley #6)         General recommendations: (PT following) Oral Care Recommendations: Oral care BID;Patient independent with oral care;Oral care prior to ice chip/H20;Oral care before and after PO Follow up Recommendations: (TBD) SLP Visit Diagnosis: Aphonia (R49.1)(Tracheostomy; PEG placement) Plan: Continue with current plan of care(for PMV)       GO                 Jerilynn Som, MS, CCC-SLP Watson,Katherine 07/01/2019, 2:25 PM

## 2019-07-01 NOTE — Progress Notes (Signed)
CH visited pt. while rounding on ICU; pt. had speaking cap on trach today which made communication much easier;  Pt. says he is feeling good --> glad to be able to drink water for the first time since he came to hospital over 2wks. ago.  Pt. is excited to be able to eat food again soon [employer], specifically) and is eager to go home to his dog.  Pt. is aware that medical team think SNF might be helpful for him, but he would rather be discharged home if possible, "While I'm still alive and able to do stuff."  Pt. spoke fondly of his life at home, sharing space w/elderly roommate w/12 cats.  Pt. said he will have someone come to help set up home ventilator and to periodically clean trach site, but he is willing to learn how to manage trach on his own as well.  Pt. continues to be grateful for prayer; CH prayed for pt.'s continued healing and strength and that he would be ready to be discharged soon.

## 2019-07-01 NOTE — TOC Progression Note (Signed)
Transition of Care [facility]) - Progression Note    Patient Details  Name: Chris Case MRN: [id number] Date of Birth: 08-04-1970  [facility] CM/SW Contact  Margarito Liner, LCSW Phone Number: 07/01/2019, 2:58 PM  Clinical Narrative: Received call from patient's roommate, Ms. Thana Ates. She is agreeable to equipment training so that patient can return home at discharge. Ms. Thana Ates is an Charity fundraiser and used to work with hospice home care. Ms. Thana Ates is fostering a cat that just had kittens two days ago so she would just like to plan the stay overnight ahead of time so she can make arrangements for them. [employer] representative and MD aware. Also notified [employer] representative that patient has a new peg tube so he will need tube feeds at discharge.    Expected Discharge Plan: Home w Home Health Services Barriers to Discharge: Continued Medical Work up  Expected Discharge Plan and Services Expected Discharge Plan: Home w Home Health Services     Post Acute Care Choice: Home Health Living arrangements for the past 2 months: Single Family Home                             HH Agency: [facility]     Representative spoke with at [facility] Agency: Feliberto Gottron   Social Determinants of Health (SDOH) Interventions    Readmission Risk Interventions Readmission Risk Prevention Plan 06/29/2019  Transportation Screening Complete  PCP or Specialist Appt within 3-5 Days Complete  HRI or Home Care Consult Complete  Palliative Care Screening Complete  Medication Review (RN Care Manager) Complete  Some recent data might be hidden

## 2019-07-01 NOTE — Progress Notes (Signed)
CRITICAL CARE NOTE  49 year old former smoker with a history very severe COPD stage IV, who presented via EMS after being found down and unresponsive.  Apparently the patient's mother placed a call to EMS.  The patient has a longstanding history of COPD and noncompliance with his oxygen and CPAP.  History could not be obtained from the patient as there was no family available and the patient had been intubated and was mechanically ventilated in the ED.  Of note he had recently been admitted to [facility] 29 May 2019 through 03 June 2019.  He had been evaluated by palliative care during this admission and actually had a DNR order.   Patient also had a 2D echo  with pulmonary hypertension consistent with cor pulmonale.  Records review: Patient has been following with Dr. Ned Clines at [facility].  Last known PFTs 11 September 2017.  FEV1 0.52 L or 20% predicted, FVC 1.49 L or 45% of predicted, FEV1/FVC 35%.  FEF 25-75 6%.  DLCO was 33%.  This is consistent with very severe COPD.  Events: 4/13 admitted for severe resp failure from COPD 4/14 remains critically ill on vent 4/15 remains critically ill, severe COPD, failed SAT/SBT 4/16 extubated yesterday, had to be reintubated remains on ventilator 4/17 sedated on the ventilator 4/18 abdominal distention, CT abdomen and pelvis consistent with ileus and constipation 4/19-patietn remains on ventialtor awainting trach 4/20 - patient is on vent with trach, he had episodes of aggitation today but eventually improved  4/21 - patient with significant distension of abdomen, concern for aerophagia in NIV.  There is mucopurulent exudation from trache this was cultures and patient placed on Unasyn for tracheitis 06/24/19 -trache site improved with less malodorous pus draining, NGT with decompression worked well abdomen is less distended. S/p PEG 06/25/19- patient had some anxiety throughout day today, we have started low dose anxiolytic. His PEG is now being  utilized. He has some placement issues with social work due to insurance and we are working on this now.  06/26/19-Patient had no events today, we are waiting for placement now from transitional care team 06/27/19- Patient is calm this morining, abdomen is mildy distended, awaiting placement to [facility] 4/26 plan for PS/TCT 4/27 Pt failed trach collar twice today. Became visibly distressed, diaphoretic, o2 drops, and accessory muscle use.  4/28 increased WOB, resp distress, using accessory muscles to breath while on vent 4/29 patient placed on PS mode, given BD therapy, alert and awake, was able to communicate with PM valve  CC  End stage COPD Vent dependant COPD Follow up resp faiure  HPI On vent Less distressed this AM Alert and awake     BP 101/73   Pulse 78   Temp 98.6 F (37 C) (Oral)   Resp 11   Ht 5\' 6"  (1.676 m)   Wt 57.5 kg   SpO2 99%   BMI 20.46 kg/m    I/O last 3 completed shifts: In: 3290.1 [I.V.:1430.1; NG/GT:1560; IV Piggyback:300] Out: 4325 [Urine:4325] No intake/output data recorded.  SpO2: 99 % O2 Flow Rate (L/min): 5 L/min FiO2 (%): 28 %  Estimated body mass index is 20.46 kg/m as calculated from the following:   Height as of this encounter: 5\' 6"  (1.676 m).   Weight as of this encounter: 57.5 kg.    Review of Systems:  Gen:  Denies  fever, sweats, chills weight loss  HEENT: Denies blurred vision, double vision, ear pain, eye pain, hearing loss, nose bleeds, sore throat Cardiac:  No dizziness, chest pain or heaviness, chest tightness,edema, No JVD Resp:   No cough, -sputum production, +shortness of breath, Other:  All other systems negative   PHYSICAL EXAMINATION:  GENERAL:critically ill appearing, +resp distress HEAD: Normocephalic, atraumatic.  EYES: Pupils equal, round, reactive to light.  No scleral icterus.  MOUTH: Moist mucosal membrane. NECK: Supple. No thyromegaly. No nodules. No JVD.  PULMONARY: +rhonchi, +wheezing CARDIOVASCULAR:  S1 and S2. Regular rate and rhythm. No murmurs, rubs, or gallops.  GASTROINTESTINAL: Soft, nontender, -distended. Positive bowel sounds.  MUSCULOSKELETAL: No swelling, clubbing, or edema.  NEUROLOGIC: alert and awake SKIN:intact,warm,dry      ALL OTHER ROS ARE NEGATIVE      MEDICATIONS: I have reviewed all medications and confirmed regimen as documented   CULTURE RESULTS   Recent Results (from the past 240 hour(s))  Aerobic Culture (superficial specimen)     Status: None   Collection Time: 06/23/19  4:55 PM   Specimen: Neck  Result Value Ref Range Status   Specimen Description   Final    NECK Performed at [facility], [address], [place], [place] [address]    Special Requests   Final    NONE Performed at [facility], [place], [place] [address]    Gram Stain   Final    NO WBC SEEN ABUNDANT GRAM POSITIVE COCCI ABUNDANT GRAM NEGATIVE RODS ABUNDANT GRAM POSITIVE RODS    Culture   Final    NORMAL OROPHARYNGEAL FLORA Performed at [facility], [place] [address], [place], [place] [address]    Report Status 06/26/2019 FINAL  Final        BMP Latest Ref Rng & Units 07/01/2019 06/30/2019 06/29/2019  Glucose 70 - 99 mg/dL 108(H) 103(H) 108(H)  BUN 6 - 20 mg/dL 12 15 6   Creatinine 0.61 - 1.24 mg/dL 0.48(L) 0.51(L) 0.47(L)  Sodium 135 - 145 mmol/L 138 134(L) 137  Potassium 3.5 - 5.1 mmol/L 4.3 4.3 3.6  Chloride 98 - 111 mmol/L 100 96(L) 97(L)  CO2 22 - 32 mmol/L 33(H) 30 34(H)  Calcium 8.9 - 10.3 mg/dL 8.3(L) 8.1(L) 8.4(L)        Indwelling Urinary Catheter continued, requirement due to   Reason to continue Indwelling Urinary Catheter strict Intake/Output monitoring for hemodynamic instability         Ventilator continued, requirement due to severe respiratory failure         ASSESSMENT AND PLAN   Severe ACUTE Hypoxic and Hypercapnic Respiratory Failure due to end stage COPD with acute COPD  exacerbation -continue Mechanical Ventilator support PS Mode -continue Bronchodilator Therapy -Wean Fio2 and PEEP as tolerated -VAP/VENT bundle implementation   SEVERE COPD EXACERBATION -continue IV steroids as prescribed -continue NEB THERAPY as prescribed -morphine as needed -wean fio2 as needed and tolerated   GI GI PROPHYLAXIS as indicated  NUTRITIONAL STATUS DIET-->TF's as tolerated Constipation protocol as indicated  ELECTROLYTES -follow labs as needed -replace as needed -pharmacy consultation and following    DVT/GI PRX ordered TRANSFUSIONS AS NEEDED MONITOR FSBS ASSESS the need for LABS as needed  PATIENT WILL NEED TRACH INDEFINATELY   Corrin Parker, M.D.  [facility]  Medical Director [place] Director [facility] Cardio-Pulmonary Department

## 2019-07-01 NOTE — TOC Progression Note (Signed)
Transition of Care [facility]) - Progression Note    Patient Details  Name: Chris Case MRN: [id number] Date of Birth: 04-07-1970  Transition of Care [facility]) CM/SW [place], LCSW Phone Number: 07/01/2019, 11:28 AM  Clinical Narrative:   CSW met with patient at bedside. Patient was alert and fully oriented. Explained to patient that CSW is having trouble finding Home Health coverage for him. Discussed SNF again with patient. Patient says he does not want to go to a SNF, and feels comfortable going home. Patient reported the only thing he feels he would need to go home is trach supplies. Patient reported his roommate, [name], is supportive and would help him take care of his trach. Patient stated that he also has a friend/neighbor who could check on him. Patient reported he feels he could manage his home vent and trach at home if he had the supplies.   Patient reported he feels he would be able to drive himself or [name] could drive him to appointments. Patient sees [facility] for PCP and uses [facility] in [place]. Patient reported confirmed he has a Trilogy, O2, walker, and shower chair at home already.   CSW reached out to [name]. Per [name], patient would have to switch from his home trilogy (noninvasive vent) to an invasive vent if he goes home with the trach. [name] reported for patient to discharge home and get trach supplies through the equipment company, patient would have to have a support person who would have to cmplete and overnight stay with patient at the hospital for trach training. [name] reported a home assessment would have to be done as well. Per [name], this process could take until next week as it is a liability for the equipment company to send any patient home with this equipment and they have to ensure his needs would be able to be met at home.   CSW attempted to call [facility] about possibly getting at least [place] services for  patient. Spoke to [name] who reported she is unsure if they have adequate nursing coverage right now. She advised to reach out to [place] at [phone]. CSW attempted call to [place] and left a message with [place] requesting a call back.  CSW updated Dr. Mortimer Fries and RT.   CSW then attempted to call patient's roommate, [name], to talk with her about this and inform her that per [employer] Representative, patient would have to have a support person at home who would have to do overnight training before patient could get trach supplies with vent at home.     Expected Discharge Plan: [place] Barriers to Discharge: Continued Medical Work up  Expected Discharge Plan and Services Expected Discharge Plan: [place] arrangements for the past 2 months: [place]: [place] ([place])     Representative spoke with at [place]: [place] (SDOH) Interventions    Readmission Risk Interventions Readmission Risk Prevention Plan 06/29/2019  Transportation Screening Complete  PCP or Specialist Appt within 3-5 Days Complete  HRI or Home Care Consult Complete  Palliative Care Screening Complete  Medication Review (RN Care Manager) Complete  Some recent data  might be hidden

## 2019-07-01 NOTE — Progress Notes (Signed)
Physical Therapy Treatment Patient Details Name: Chris Case MRN: [id number] DOB: 08/11/1970 Today's Date: 07/01/2019    History of Present Illness Chris Case is a 48yoM who comes [facility] on 4/13 after being found unresponsive. Pt here 29 May 2019 through 03 June 2019. Pt required ventilation upon arrival, failed extubation on 4/16, transitioned to tach on 4/20. Pt has required NGT decompression d/t ABD distension, underwent  PEG placement and lysis adhesions 4/22. 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    PT Comments    Pt was supine in bed with HOB elevated ~ 30 degrees upon arriving. Pt is alert throughout but reports feeling fatigued from SLP/ trach collar trial. He requested to only perform bed level exercises and several times during session requested increased vent support however vitals stable throughout. A lot of vcs for relaxation throughout session.Anxiety seems to limit pt but he was able to perform and tolerate exercises listed below. Therapist will coordinate with RT/RN/Pt best time for next session. Per RN, trach will be downsized next date in PM. Acute PT will continue to follow per current POC progressing as able per pt tolerance. He did demonstrate good strength with exercises performed. Will trial OOB next session if appropriate.       Follow Up Recommendations  LTACH;Supervision for mobility/OOB;Supervision/Assistance - 24 hour     Equipment Recommendations  None recommended by PT    Recommendations for Other Services       Precautions / Restrictions Precautions Precautions: Fall;Other (comment) Precaution Comments: trach, vent Restrictions Weight Bearing Restrictions: No    Mobility  Bed Mobility               General bed mobility comments: Pt/ pt's RN requesting pt stay in bed and perform bed level ther ex  Transfers                    Ambulation/Gait                 Stairs             Wheelchair Mobility    Modified Rankin (Stroke Patients Only)       Balance                                            Cognition Arousal/Alertness: Awake/alert Behavior During Therapy: WFL for tasks assessed/performed Overall Cognitive Status: Within Functional Limits for tasks assessed                                 General Comments: pt was agreeable to bed level exercises but reports fatigue from earlier trach collar trials and SLP session      Exercises General Exercises - Lower Extremity Ankle Circles/Pumps: AROM;20 reps;Both Quad Sets: AROM;Both;10 reps;Supine Gluteal Sets: AROM;10 reps Heel Slides: AROM;Both;10 reps Hip ABduction/ADduction: AROM;Both;10 reps Straight Leg Raises: AROM;Both;10 reps    General Comments        Pertinent Vitals/Pain Pain Assessment: 0-10 Pain Score: 3  Pain Location: abdominal pain Pain Intervention(s): Limited activity within patient's tolerance;Monitored during session    Home Living                      Prior Function  PT Goals (current goals can now be found in the care plan section) Acute Rehab PT Goals Patient Stated Goal: " go home" Progress towards PT goals: Progressing toward goals    Frequency    Min 2X/week      PT Plan Current plan remains appropriate    Co-evaluation              AM-PAC PT "6 Clicks" Mobility   Outcome Measure  Help needed turning from your back to your side while in a flat bed without using bedrails?: A Little Help needed moving from lying on your back to sitting on the side of a flat bed without using bedrails?: A Little Help needed moving to and from a bed to a chair (including a wheelchair)?: A Lot Help needed standing up from a chair using your arms (e.g., wheelchair or bedside chair)?: A Lot Help needed to walk in hospital room?: Total Help needed climbing 3-5 steps with a railing? : Total 6 Click  Score: 12    End of Session Equipment Utilized During Treatment: Oxygen Activity Tolerance: Patient tolerated treatment well Patient left: in bed;with nursing/sitter in room;with call bell/phone within reach Nurse Communication: Mobility status PT Visit Diagnosis: Unsteadiness on feet (R26.81);Muscle weakness (generalized) (M62.81);History of falling (Z91.81);Other abnormalities of gait and mobility (R26.89)     Time: 1430-1443 PT Time Calculation (min) (ACUTE ONLY): 13 min  Charges:  $Therapeutic Exercise: 8-22 mins                     Julaine Fusi PTA 07/01/19, 4:16 PM

## 2019-07-01 NOTE — Progress Notes (Addendum)
PHARMACY CONSULT NOTE  Pharmacy Consult for Electrolyte Monitoring and Replacement   Recent Labs: Potassium (mmol/L)  Date Value  07/01/2019 4.3  09/24/2013 4.6   Magnesium (mg/dL)  Date Value  09/62/8366 1.9   Calcium (mg/dL)  Date Value  29/47/6546 8.3 (L)   Calcium, Total (mg/dL)  Date Value  50/35/4656 8.9   Albumin (g/dL)  Date Value  81/27/5170 3.5   Phosphorus (mg/dL)  Date Value  01/74/9449 3.3   Sodium (mmol/L)  Date Value  07/01/2019 138  09/24/2013 138     Assessment: 49 year old male admitted after being found unresponsive and admitted with end stage COPD. Patient was intubated in the ED and transferred to the ICU. Patient started on tube feeds and at refeeding risk.  Patient extubated 4/15 am and placed on BiPAP. Patient required reintubation. Currently post-tracheostomy.  Trach 4/20, G-tube placement 4/22.  Electrolytes: Will replace to maintain K ~4 and mag ~2.     Will give magnesium IV 1 g x 1 dose.  Phosphorus continuing to trend down, will monitor.  Will obtain labs in the AM.   Glucose: Currently with consistently low SBG in the 90-100s.  Patient is receiving Osmolite 1.5 VT at 60 mL/hr, as well as Juven 1 packet VT BID between meals.  D10 currently at 50 mL/hr.  Per discussion in AM ICU rounds, patient was increased to stress dose steroids.  Will reassess BG tomorrow.  Constipation: LBM 4/26 and currently receiving senna-docusate 2 tab VT BID and erythromycin 250 mg IV q8 hours.  Will add miralax daily.    Pharmacy will continue to monitor and adjust per consult.   Cherly Hensen, PharmD 07/01/2019 3:04 PM

## 2019-07-01 NOTE — Evaluation (Addendum)
Clinical/Bedside Swallow Evaluation Patient Details  Name: Chris Case MRN: [id number] Date of Birth: 1970-04-02  Today's Date: 07/01/2019 Time: SLP Start Time (ACUTE ONLY): 0900 SLP Stop Time (ACUTE ONLY): 0935 SLP Time Calculation (min) (ACUTE ONLY): 35 min  Past Medical History:  Past Medical History:  Diagnosis Date  . COPD (chronic obstructive pulmonary disease) (HCC)   . Emphysema lung (HCC)   . 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 APPENDECTOMY  late 1980's  . GASTROSTOMY N/A 06/24/2019   Procedure: INSERTION OF GASTROSTOMY TUBE ROBOT ASSISTED;  Surgeon: Leafy Ro, MD;  Location: [facility] ORS;  Service: General;  Laterality: N/A;  . LAPAROTOMY N/A 12/06/2016   Procedure: EXPLORATORY LAPAROTOMY- SBO;  Surgeon: [name], MD;  Location: [facility] ORS;  Service: General;  Laterality: N/A;  . TRACHEOSTOMY TUBE PLACEMENT N/A 06/22/2019   Procedure: TRACHEOSTOMY;  Surgeon: Bud Face, MD;  Location: [facility] ORS;  Service: ENT;  Laterality: N/A;   HPI:  Pt is a 49 year old former smoker with a history very severe COPD stage IV, who presented via EMS after being found down and unresponsive.  Apparently the patient's mother placed a call to EMS.  The patient has a longstanding history of COPD and noncompliance with his oxygen and CPAP.  History could not be obtained from the patient as there was no family available and the patient had been intubated and was mechanically ventilated in the ED.  Of note, he had recently been [facility] to [facility] 29 May 2019 through 03 June 2019.  Patient s/p tracheostomy tube placement on 4/20;  s/p robotic assisted laparoscopic G-tube placement with extensive lysis of adhesions on 4/22.  Severe Malnutrition related to chronic illness(COPD) as evidenced by severe muscle and fat depletion per Dietician note.    Assessment / Plan / Recommendation Clinical  Impression  Pt seen today for BSE. Pt is s/p Tracheostomy tube placement on 06/22/2019 post admission to Hospital for acute COPD exacerbation requiring oral intubation. Pt is also s/p PEG placement for nutrition/hydration needs. Pt has been tolerating vent weaning tolerating Trach Collar(TC) weans -- off vent for this evaluation for ~1 hour on [place] O2 support w/ PMV placed(w/out difficulty). O2 sats in the upper 90s, HR/RR stable. Trach Cuff remains DEFLATED at baseline during weans, w/ any PMV wear. Pt has a #8 Shiley trach but seeking downsize to a Shiley #6 for ease of expiratory airflow. Pt tolerated the PMV placement for extended time w/ adequate O2 sats and no discomfort per his report. Pt's volume of speech is min decreased w/ min effort/breathiness present -- suspect impacted the size trach(shiley #8).  Pt positioned upright in bed for BSE; trials of ice chips and thin liquids. Pt fed self given setup and followed aspiration precautions as instructed. He consumed 10+ trials each of ice chips and thin liquids VIA CUP w/ no overt, clinical s/s of aspiration noted; vocal quality clear and no decline in O2 sats. Respiratory effort remained grossly the same; pt was instructed on using Rest Breaks b/t trials to maintain calm breathing. Pt was also instructed to use a f/u, Dry swallow to clear any potential pharyngeal residue b/t trials d/t impact of tracheostomy on pharyngeal-laryngeal pressures. Oral phase appeared [facility] for bolus management and oral clearing. OM exam appeared [facility] w/ no uniltateral weakness. Recommend initiation of [place]asure po's of ice chips and thin liquids w/ NSG monitoring status;  strict aspiration precautions. ST services will f/u w/ further po trials to include more volume of intake and solids when pt's trach size can be downsized to a Shiley #6 for ease of respiratory support during po tasks/intake. NSG and MD updated. Pt agreed.  SLP Visit Diagnosis: Dysphagia, oropharyngeal phase  (R13.12)(Tracheostomy; PEG placement)    Aspiration Risk  Mild aspiration risk;Risk for inadequate nutrition/hydration(reduced following precautions)    Diet Recommendation  Pleasure po's of ice chips and thin liquids VIA CUP ONLY -- MUST WEAR PMV W/ ALL ORAL INTAKE; aspiration precautions; oral care b/f any oral intake. NSG monitoring of pt's status.   Medication Administration: Via alternative means(PEG placement)    Other  Recommendations Recommended Consults: (Dietician following) Oral Care Recommendations: Oral care BID;Patient independent with oral care;Oral care prior to ice chip/H20;Oral care before and after PO Other Recommendations: (n/a)   Follow up Recommendations (TBD)      Frequency and Duration min 3x week  2 weeks       Prognosis Prognosis for Safe Diet Advancement: Fair(-Good) Barriers to Reach Goals: (tracheostomy)      Swallow Study   General Date of Onset: 06/15/19 HPI: Pt is a 49 year old former smoker with a history very severe COPD stage IV, who presented via EMS after being found down and unresponsive.  Apparently the patient's mother placed a call to EMS.  The patient has a longstanding history of COPD and noncompliance with his oxygen and CPAP.  History could not be obtained from the patient as there was no family available and the patient had been intubated and was mechanically ventilated in the ED.  Of note, he had recently been admitted to [facility] 29 May 2019 through 03 June 2019.  Patient s/p tracheostomy tube placement on 4/20;  s/p robotic assisted laparoscopic G-tube placement with extensive lysis of adhesions on 4/22.  Severe Malnutrition related to chronic illness(COPD) as evidenced by severe muscle and fat depletion per Dietician note.  Type of Study: Bedside Swallow Evaluation Previous Swallow Assessment: none Diet Prior to this Study: NPO;PEG tube Temperature Spikes Noted: No(wbc 9.4) Respiratory Status: Nasal cannula(w/ PMV placed on  trach) History of Recent Intubation: Yes Length of Intubations (days): 8 days Date extubated: 06/22/19(tracheostomy placed) Behavior/Cognition: Alert;Cooperative;Pleasant mood Oral Cavity Assessment: Within Functional Limits Oral Care Completed by SLP: Yes Oral Cavity - Dentition: Adequate natural dentition Vision: Functional for self-feeding Self-Feeding Abilities: Able to feed self;Needs set up(support) Patient Positioning: Upright in bed(support given) Baseline Vocal Quality: Low vocal intensity(w/ PMV placed; min inspiratory effort noted) Volitional Cough: Strong Volitional Swallow: Able to elicit    Oral/Motor/Sensory Function Overall Oral Motor/Sensory Function: Within functional limits   Ice Chips Ice chips: Within functional limits Presentation: Spoon(fed; 10 trials) Other Comments: masticated chips   Thin Liquid Thin Liquid: Within functional limits Presentation: Cup;Self Fed(10 trials) Other Comments: small, single sips used    Nectar Thick Nectar Thick Liquid: Not tested   Honey Thick Honey Thick Liquid: Not tested   Puree Puree: Not tested   Solid     Solid: Not tested       Orinda Kenner, MS, CCC-SLP Ruthanna Macchia 07/01/2019,2:51 PM

## 2019-07-01 NOTE — Progress Notes (Signed)
Pt with increased WOB and diaphoretic when hooked back up to ventilator. Pt given a one time order for 2 mg versed and 100 of fentanyl. Pt now relaxed and spo2 above 90%.

## 2019-07-02 LAB — BASIC METABOLIC PANEL
Anion gap: 16 — ABNORMAL HIGH (ref 5–15)
BUN: 15 mg/dL (ref 6–20)
CO2: 31 mmol/L (ref 22–32)
Calcium: 8.2 mg/dL — ABNORMAL LOW (ref 8.9–10.3)
Chloride: 91 mmol/L — ABNORMAL LOW (ref 98–111)
Creatinine, Ser: <0.3 mg/dL — ABNORMAL LOW (ref 0.61–1.24)
Glucose, Bld: 140 mg/dL — ABNORMAL HIGH (ref 70–99)
Potassium: 4.1 mmol/L (ref 3.5–5.1)
Sodium: 138 mmol/L (ref 135–145)

## 2019-07-02 LAB — CBC WITH DIFFERENTIAL/PLATELET
Abs Immature Granulocytes: 0.19 10*3/uL — ABNORMAL HIGH (ref 0.00–0.07)
Basophils Absolute: 0 10*3/uL (ref 0.0–0.1)
Basophils Relative: 0 %
Eosinophils Absolute: 0 10*3/uL (ref 0.0–0.5)
Eosinophils Relative: 0 %
HCT: 28.1 % — ABNORMAL LOW (ref 39.0–52.0)
Hemoglobin: 9.1 g/dL — ABNORMAL LOW (ref 13.0–17.0)
Immature Granulocytes: 2 %
Lymphocytes Relative: 5 %
Lymphs Abs: 0.5 10*3/uL — ABNORMAL LOW (ref 0.7–4.0)
MCH: 30.7 pg (ref 26.0–34.0)
MCHC: 32.4 g/dL (ref 30.0–36.0)
MCV: 94.9 fL (ref 80.0–100.0)
Monocytes Absolute: 0.4 10*3/uL (ref 0.1–1.0)
Monocytes Relative: 3 %
Neutro Abs: 9.9 10*3/uL — ABNORMAL HIGH (ref 1.7–7.7)
Neutrophils Relative %: 90 %
Platelets: 182 10*3/uL (ref 150–400)
RBC: 2.96 MIL/uL — ABNORMAL LOW (ref 4.22–5.81)
RDW: 12.8 % (ref 11.5–15.5)
WBC: 11 10*3/uL — ABNORMAL HIGH (ref 4.0–10.5)
nRBC: 0 % (ref 0.0–0.2)

## 2019-07-02 LAB — GLUCOSE, CAPILLARY
Glucose-Capillary: 100 mg/dL — ABNORMAL HIGH (ref 70–99)
Glucose-Capillary: 123 mg/dL — ABNORMAL HIGH (ref 70–99)
Glucose-Capillary: 133 mg/dL — ABNORMAL HIGH (ref 70–99)
Glucose-Capillary: 147 mg/dL — ABNORMAL HIGH (ref 70–99)
Glucose-Capillary: 148 mg/dL — ABNORMAL HIGH (ref 70–99)
Glucose-Capillary: 84 mg/dL (ref 70–99)
Glucose-Capillary: 84 mg/dL (ref 70–99)

## 2019-07-02 LAB — MAGNESIUM: Magnesium: 2.1 mg/dL (ref 1.7–2.4)

## 2019-07-02 LAB — PHOSPHORUS: Phosphorus: 6.8 mg/dL — ABNORMAL HIGH (ref 2.5–4.6)

## 2019-07-02 MED ORDER — ZIPRASIDONE MESYLATE 20 MG IM SOLR: 20.0000 mg | Freq: Once | INTRAMUSCULAR | Status: DC

## 2019-07-02 MED ORDER — MORPHINE SULFATE (PF) 2 MG/ML IV SOLN
2.0000 mg | INTRAVENOUS | Status: DC | PRN
  Administered 2019-07-02 – 2019-07-29 (×73): 2 mg via INTRAVENOUS
  Filled 2019-07-02 (×77): qty 1

## 2019-07-02 NOTE — Progress Notes (Signed)
Visit with patient who was awake and watching t.v. Patient is frustrated because his discharge continues to be delayed. I encouraged the patient to hang in there so he is healthy and strong enough to eliminate any setback or relapse. This seemed to be helpful to the conversation. I prayed for him and assured him the chaplains would continue to follow up.

## 2019-07-02 NOTE — Progress Notes (Signed)
PT Cancellation Note  Patient Details Name: Chris Case MRN: [id number] DOB: 1970/08/07   Cancelled Treatment:    Reason Eval/Treat Not Completed: Other (comment).  Chart reviewed.  MD in pt's room; nurse reports that pt is having his trach changed (pt not available for PT session).  Will re-attempt PT treatment session at a later date/time.  Hendricks Limes, PT 07/02/19, 2:31 PM

## 2019-07-02 NOTE — Progress Notes (Signed)
OT Cancellation Note  Patient Details Name: Chris Case MRN: [id number] DOB: 08-26-1970   Cancelled Treatment:    Reason Eval/Treat Not Completed: Patient at procedure or test/ unavailable. Pt unavailable, having trach changed. Will re-attempt OT tx at later date/time as appropriate.   Richrd Prime, MPH, MS, OTR/L ascom [phone] 07/02/19, 2:55 PM

## 2019-07-02 NOTE — Progress Notes (Signed)
Pt taken off vent and placed on 28% Trach Collar, tolerating well at this time. RN notified will continue to monitor.

## 2019-07-02 NOTE — TOC Progression Note (Addendum)
Transition of Care [facility]) - Progression Note    Patient Details  Name: Chris Case MRN: [id number] Date of Birth: 08/17/1970  Transition of Care [facility] CM/SW Contact  Chris Cline, LCSW Phone Number: 07/02/2019, 11:55 AM  Clinical Narrative:   CSW reached out to [employer] Representative [name] due to patient's roommate being willing to complete overnight trach/vent training. Roommate is requesting advanced notice so she can plan for pet care while she stays overnight for this training. Nida Boatman reported the earliest this training could be done is Monday. Inquired what needs to be done to set this up, waiting on follow up from [place].  Reached out to [facility] Representative [place] with updates and to inquire if they may be able to accept patient if he improves and is discharged next week as previously discussed. Barbara Cower to assess and let CSW know.  Also attempted to reach out to [facility] again. Spoke to [employer]. [facility] only has Charity fundraiser, LPN, HHA, and CNA services only. Provided referral information and Sherice reported someone will review patient's information and will call me back about availability on Monday.  2:19- Reached out to [employer] again about setting up overnight with roommate. Nida Boatman states he has already started the process of getting this set up and is working with [employer]'s RT Department on this.   4:00- Confirmed with Nida Boatman that patient will also need supplies for feeds.  CSW will continue to follow.       Expected Discharge Plan: Home w Home Health Services Barriers to Discharge: Continued Medical Work up  Expected Discharge Plan and Services Expected Discharge Plan: Home w Home Health Services     Post Acute Care Choice: Home Health Living arrangements for the past 2 months: Single Family Home                             HH Agency: [facility]     Representative spoke with at  [facility] Agency: Feliberto Gottron   Social Determinants of Health (SDOH) Interventions    Readmission Risk Interventions Readmission Risk Prevention Plan 06/29/2019  Transportation Screening Complete  PCP or Specialist Appt within 3-5 Days Complete  HRI or Home Care Consult Complete  Palliative Care Screening Complete  Medication Review (RN Care Manager) Complete  Some recent data might be hidden

## 2019-07-02 NOTE — Progress Notes (Signed)
CRITICAL CARE NOTE  49 year old former smoker with a history very severe COPD stage IV, who presented via EMS after being found down and unresponsive.  Apparently the patient's mother placed a call to EMS.  The patient has a longstanding history of COPD and noncompliance with his oxygen and CPAP.  History could not be obtained from the patient as there was no family available and the patient had been intubated and was mechanically ventilated in the ED.  Of note he had recently been admitted to [facility] 29 May 2019 through 03 June 2019.  He had been evaluated by palliative care during this admission and actually had a DNR order.   Patient also had a 2D echo  with pulmonary hypertension consistent with cor pulmonale.  Records review: Patient has been following with Dr. Ned Clines at [facility].  Last known PFTs 11 September 2017.  FEV1 0.52 L or 20% predicted, FVC 1.49 L or 45% of predicted, FEV1/FVC 35%.  FEF 25-75 6%.  DLCO was 33%.  This is consistent with very severe COPD.  Events: 4/13 admitted for severe resp failure from COPD 4/14 remains critically ill on vent 4/15 remains critically ill, severe COPD, failed SAT/SBT 4/16 extubated yesterday, had to be reintubated remains on ventilator 4/17 sedated on the ventilator 4/18 abdominal distention, CT abdomen and pelvis consistent with ileus and constipation 4/19-patietn remains on ventialtor awainting trach 4/20 - patient is on vent with trach, he had episodes of aggitation today but eventually improved  4/21 - patient with significant distension of abdomen, concern for aerophagia in NIV.  There is mucopurulent exudation from trache this was cultures and patient placed on Unasyn for tracheitis 06/24/19 -trache site improved with less malodorous pus draining, NGT with decompression worked well abdomen is less distended. S/p PEG 06/25/19- patient had some anxiety throughout day today, we have started low dose anxiolytic. His PEG is now being  utilized. He has some placement issues with social work due to insurance and we are working on this now.  06/26/19-Patient had no events today, we are waiting for placement now from transitional care team 06/27/19- Patient is calm this morining, abdomen is mildy distended, awaiting placement to [facility] 4/26 plan for PS/TCT 4/27 Pt failed trach collar twice today. Became visibly distressed, diaphoretic, o2 drops, and accessory muscle use.  4/28 increased WOB, resp distress, using accessory muscles to breath while on vent 4/29 patient placed on PS mode, given BD therapy, alert and awake, was able to communicate with PM valve   CC  Severe COPD Severe resp failure     BP 98/70   Pulse 83   Temp 98.4 F (36.9 C) (Oral)   Resp 11   Ht 5\' 6"  (1.676 m)   Wt 57.5 kg   SpO2 99%   BMI 20.46 kg/m    I/O last 3 completed shifts: In: 2175.5 [I.V.:1681; IV Piggyback:494.5] Out: 4300 [Urine:4300] No intake/output data recorded.  SpO2: 99 % O2 Flow Rate (L/min): 2 L/min FiO2 (%): 28 %  Estimated body mass index is 20.46 kg/m as calculated from the following:   Height as of this encounter: 5\' 6"  (1.676 m).   Weight as of this encounter: 57.5 kg.  REVIEW OF SYSTEMS  10 point ROS done and is neg except as per subjective findings  PHYSICAL EXAMINATION: Physical Exam  HENT:  Head: Normocephalic and atraumatic.    Cardiovascular: Regular rhythm.  Pulmonary/Chest: Breath sounds normal. No accessory muscle usage. Tachypnea noted. No respiratory distress.  Abdominal:  Bowel sounds are normal. He exhibits no distension, no fluid wave and no ascites. There is no abdominal tenderness.  Neurological: He is alert.       MEDICATIONS: I have reviewed all medications and confirmed regimen as documented   CULTURE RESULTS   Recent Results (from the past 240 hour(s))  Aerobic Culture (superficial specimen)     Status: None   Collection Time: 06/23/19  4:55 PM   Specimen: Neck  Result  Value Ref Range Status   Specimen Description   Final    NECK Performed at [facility], [address], [place], [place] [address]    Special Requests   Final    NONE Performed at [facility], [place], [place] [address]    Gram Stain   Final    NO WBC SEEN ABUNDANT GRAM POSITIVE COCCI ABUNDANT GRAM NEGATIVE RODS ABUNDANT GRAM POSITIVE RODS    Culture   Final    NORMAL OROPHARYNGEAL FLORA Performed at [facility], [place] [address], [place],  [address]    Report Status 06/26/2019 FINAL  Final        BMP Latest Ref Rng & Units 07/02/2019 07/01/2019 06/30/2019  Glucose 70 - 99 mg/dL 140(H) 108(H) 103(H)  BUN 6 - 20 mg/dL 15 12 15   Creatinine 0.61 - 1.24 mg/dL <0.30(L) 0.48(L) 0.51(L)  Sodium 135 - 145 mmol/L 138 138 134(L)  Potassium 3.5 - 5.1 mmol/L 4.1 4.3 4.3  Chloride 98 - 111 mmol/L 91(L) 100 96(L)  CO2 22 - 32 mmol/L 31 33(H) 30  Calcium 8.9 - 10.3 mg/dL 8.2(L) 8.3(L) 8.1(L)         Indwelling Urinary Catheter continued, requirement due to   Reason to continue Indwelling Urinary Catheter strict Intake/Output monitoring for hemodynamic instability         Ventilator continued, requirement due to severe respiratory failure   Ventilator Sedation RASS 0 to -2      ASSESSMENT AND PLAN   Acute on chronic hypercarbic/hypoxic respiratory failure COPD exacerbation s/ptracheostomy - there is mucopurulent exudation concerning for  Bronchodilators: DuoNeb every 4 and as needed IV corticosteroids There continues to be no indication for antibiotics at present  Severe COPD stage IV  Noted noncompliance with oxygen/BiPAP at home -continue LAMA/LABA/ICS   Tracheitis associated with tracheostomy -S/p culture of mucopurulent exudate-abundant GPC and GPR consistent with overgrowth of flora -s/p Unasyn with significant improvement in discharge and malodorous smell - completed full course unasyn and s/p erythromycin     Acute encephalopathy-resolved Unresponsiveness Due to hypercarbia    Hypoglycemia Due to poor nutritional status Continue tube feeds Add D10 ICU hypoglycemia protocol Poor prognostic sign  Acute kidney injury  Avoid nephrotoxins Stable with no worsening Trend renal panel  Hyperkalemia Likely triggered by acidosis RESOLVED  ID Procalcitonin normal No infiltrate on chest x-ray No indication for antibiotics at present Continue to trend WBC/fever curve Had 1 single blood culture of MRSA likely contaminant [name] MRSA PCR negative  Abdominal distention Ileus CT abdomen and pelvis: No obstruction: No free air Findings likely due to ileus, constipation Bowel regimen  Prophylaxis: Lovenox Famotidine    Critical care provider statement:    Critical care time (minutes):  35   Critical care time was exclusive of:  Separately billable procedures and  treating other patients   Critical care was necessary to treat or prevent imminent or  life-threatening deterioration of the following conditions:  Acute hypercapnic respiratory failure requiring tracheostomy  Critical care was time spent personally by me on the following  activities:  Development of treatment plan with patient or surrogate,  discussions with consultants, evaluation of patient's response to  treatment, examination of patient, obtaining history from patient or  surrogate, ordering and performing treatments and interventions, ordering  and review of laboratory studies and re-evaluation of patient's condition   I assumed direction of critical care for this patient from another  provider in my specialty: no       Vida Rigger, M.D.  Pulmonary & Critical Care Medicine  [facility]

## 2019-07-02 NOTE — Progress Notes (Signed)
PHARMACY CONSULT NOTE  Pharmacy Consult for Electrolyte Monitoring and Replacement   Recent Labs: Potassium (mmol/L)  Date Value  07/02/2019 4.1  09/24/2013 4.6   Magnesium (mg/dL)  Date Value  02/24/8249 2.1   Calcium (mg/dL)  Date Value  03/70/4888 8.2 (L)   Calcium, Total (mg/dL)  Date Value  91/69/4503 8.9   Albumin (g/dL)  Date Value  88/82/8003 3.5   Phosphorus (mg/dL)  Date Value  49/17/9150 6.8 (H)   Sodium (mmol/L)  Date Value  07/02/2019 138  09/24/2013 138     Assessment: 49 year old male admitted after being found unresponsive and admitted with end stage COPD. Patient was intubated in the ED and transferred to the ICU. Patient started on tube feeds and at refeeding risk.   Patient extubated 4/15 am and placed on BiPAP. Patient required reintubation. Currently post-tracheostomy.  Trach 4/20, G-tube placement 4/22.  Electrolytes: Will replace to maintain K ~4 and mag ~2.     Electrolytes WNL.  No replacement warranted at this time.  Will obtain labs in the AM.  Glucose: Currently with consistently low SBG with improvement to 130-140s following stress dose steroids with D10 at 50 mL/hr.  Currently off steroids.  Patient is receiving Osmolite 1.5 VT at 60 mL/hr, as well as Juven 1 packet VT BID between meals.  Will continue to monitor response.  Constipation: LBM 4/29 and currently receiving senna-docusate 2 tab VT BID and miralax daily.  He was placed on erythromycin for additional support, but this is now discontinued.    Pharmacy will continue to monitor and adjust per consult.   Cherly Hensen, PharmD 07/02/2019 1:27 PM

## 2019-07-03 ENCOUNTER — Inpatient Hospital Stay: Payer: Medicaid Other

## 2019-07-03 LAB — BASIC METABOLIC PANEL
Anion gap: 7 (ref 5–15)
BUN: 14 mg/dL (ref 6–20)
CO2: 34 mmol/L — ABNORMAL HIGH (ref 22–32)
Calcium: 8.2 mg/dL — ABNORMAL LOW (ref 8.9–10.3)
Chloride: 91 mmol/L — ABNORMAL LOW (ref 98–111)
Creatinine, Ser: 0.56 mg/dL — ABNORMAL LOW (ref 0.61–1.24)
GFR calc Af Amer: >60 mL/min (ref >60)
GFR calc non Af Amer: >60 mL/min (ref >60)
Glucose, Bld: 479 mg/dL — ABNORMAL HIGH (ref 70–99)
Potassium: 3.7 mmol/L (ref 3.5–5.1)
Sodium: 132 mmol/L — ABNORMAL LOW (ref 135–145)

## 2019-07-03 LAB — CBC WITH DIFFERENTIAL/PLATELET
Abs Immature Granulocytes: 0.14 10*3/uL — ABNORMAL HIGH (ref 0.00–0.07)
Basophils Absolute: 0 10*3/uL (ref 0.0–0.1)
Basophils Relative: 0 %
Eosinophils Absolute: 0.2 10*3/uL (ref 0.0–0.5)
Eosinophils Relative: 2 %
HCT: 28.4 % — ABNORMAL LOW (ref 39.0–52.0)
Hemoglobin: 8.8 g/dL — ABNORMAL LOW (ref 13.0–17.0)
Immature Granulocytes: 1 %
Lymphocytes Relative: 12 %
Lymphs Abs: 1.2 10*3/uL (ref 0.7–4.0)
MCH: 30.9 pg (ref 26.0–34.0)
MCHC: 31 g/dL (ref 30.0–36.0)
MCV: 99.6 fL (ref 80.0–100.0)
Monocytes Absolute: 0.7 10*3/uL (ref 0.1–1.0)
Monocytes Relative: 7 %
Neutro Abs: 8 10*3/uL — ABNORMAL HIGH (ref 1.7–7.7)
Neutrophils Relative %: 78 %
Platelets: 171 10*3/uL (ref 150–400)
RBC: 2.85 MIL/uL — ABNORMAL LOW (ref 4.22–5.81)
RDW: 13.2 % (ref 11.5–15.5)
WBC: 10.3 10*3/uL (ref 4.0–10.5)
nRBC: 0 % (ref 0.0–0.2)

## 2019-07-03 LAB — GLUCOSE, CAPILLARY
Glucose-Capillary: 102 mg/dL — ABNORMAL HIGH (ref 70–99)
Glucose-Capillary: 102 mg/dL — ABNORMAL HIGH (ref 70–99)
Glucose-Capillary: 107 mg/dL — ABNORMAL HIGH (ref 70–99)
Glucose-Capillary: 111 mg/dL — ABNORMAL HIGH (ref 70–99)
Glucose-Capillary: 85 mg/dL (ref 70–99)
Glucose-Capillary: 87 mg/dL (ref 70–99)
Glucose-Capillary: 92 mg/dL (ref 70–99)

## 2019-07-03 LAB — PHOSPHORUS: Phosphorus: 2.9 mg/dL (ref 2.5–4.6)

## 2019-07-03 LAB — MAGNESIUM: Magnesium: 1.8 mg/dL (ref 1.7–2.4)

## 2019-07-03 MED ORDER — MIRTAZAPINE 15 MG PO TABS: 30.0000 mg | ORAL_TABLET | Freq: Every day | ORAL | Status: DC

## 2019-07-03 MED ORDER — MIRTAZAPINE 15 MG PO TABS
30.0000 mg | ORAL_TABLET | Freq: Every day | ORAL | Status: DC
  Filled 2019-07-03: qty 2

## 2019-07-03 MED ORDER — MIRTAZAPINE 15 MG PO TABS
30.0000 mg | ORAL_TABLET | Freq: Every day | ORAL | Status: DC
  Administered 2019-07-03 – 2019-07-04 (×2): 30 mg
  Filled 2019-07-03: qty 2

## 2019-07-03 NOTE — Progress Notes (Signed)
PHARMACY CONSULT NOTE  Pharmacy Consult for Electrolyte Monitoring and Replacement   Recent Labs: Potassium (mmol/L)  Date Value  07/03/2019 3.7  09/24/2013 4.6   Magnesium (mg/dL)  Date Value  88/28/0034 1.8   Calcium (mg/dL)  Date Value  91/79/1505 8.2 (L)   Calcium, Total (mg/dL)  Date Value  69/79/4801 8.9   Albumin (g/dL)  Date Value  65/53/7482 3.5   Phosphorus (mg/dL)  Date Value  70/78/6754 2.9   Sodium (mmol/L)  Date Value  07/03/2019 132 (L)  09/24/2013 138     Assessment: 49 year old male admitted after being found unresponsive and admitted with end stage COPD. Patient was intubated in the ED and transferred to the ICU. Patient started on tube feeds and at refeeding risk.   Patient extubated 4/15 am and placed on BiPAP. Patient required reintubation. Currently post-tracheostomy.  Trach 4/20, G-tube placement 4/22.  Electrolytes: Will replace to maintain K ~4 and mag ~2.     Electrolytes WNL.  No replacement warranted at this time.  Glucose: Currently with consistently low CBG. On D10 at 50 mL/hr with NS to avoid hyponatremia. Currently off steroids.  Patient is receiving Osmolite 1.5 VT at 60 mL/hr, as well as Juven 1 packet VT BID between meals.  Will continue to monitor response.  Constipation: LBM 4/29 and currently receiving senna-docusate 2 tab VT BID and miralax daily.  He was placed on erythromycin for additional support, but this is now discontinued.    Pharmacy will continue to monitor and adjust per consult.   Pricilla Riffle, PharmD 07/03/2019 10:35 AM

## 2019-07-03 NOTE — Progress Notes (Addendum)
RT called to room to assess patient. Pt found to be in severe distress, using his accessory muscles, and tachypneic. Patient had not long finished working with PT. RR set on vent increased to help support his respirations, Neb treatment given, and RN administered something help ease his anxiety. Patient very slowly began to calm down. Saturations were in the mid 90's.

## 2019-07-03 NOTE — Progress Notes (Addendum)
Patient removed from vent, cuff deflated, and placed on 2L [place]. Patient tolerating well at this time. Will attempt Shelbie Hutching valve if patient continues to tolerate [place]. RN aware of transition from vent to [place].

## 2019-07-03 NOTE — Progress Notes (Signed)
Physical Therapy Treatment Patient Details Name: Chris Case MRN: [id number] DOB: [birth year] Today's Date: 07/03/2019    History of Present Illness Chris Case is a 48yoM who comes to [facility] on 4/13 after being found unresponsive. Pt here 29 May 2019 through 03 June 2019. Pt required ventilation upon arrival, failed extubation on 4/16, transitioned to tach on 4/20. Pt has required NGT decompression d/t ABD distension, underwent  PEG placement and lysis adhesions 4/22. 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    PT Comments    Pt was long sitting in bed upon arriving on vent throughout. He is very cooperative and motivated. Therapist able to read pt's lips when pt requested to trial OOB activity. He was able to achieve EOB short sit with min assist. Stood 2 x EOB to RW and tolerated well. Was able to alternate marching in place 2 x 20 with extended rest break between sets. Minimal labored breathing noted. Pt educated on breathing and relaxation techniques.  Overall pt tolerated session well. Prior to performing OOB activity, pt had performed there ex in bed and continues to demonstrate improving strength. List of exercises performed listed below. Acute PT will continue to progress pt as able per pt tolerance.     Follow Up Recommendations  LTACH;Supervision for mobility/OOB;Supervision/Assistance - 24 hour     Equipment Recommendations  None recommended by PT    Recommendations for Other Services       Precautions / Restrictions Precautions Precautions: Fall(O2/BP/vent ) Precaution Comments: trach, vent Restrictions Weight Bearing Restrictions: No    Mobility  Bed Mobility Overal bed mobility: Needs Assistance Bed Mobility: Supine to Sit;Sit to Supine     Supine to sit: Min assist;HOB elevated Sit to supine: Min assist;HOB elevated   General bed mobility comments: pt was able to exit/re-enter bed with min assist +1 HHA.  increased time to perform with management of line/tubes  Transfers Overall transfer level: Needs assistance Equipment used: Rolling walker (2 wheeled) Transfers: Sit to/from Stand Sit to Stand: Min assist         General transfer comment: pt performed STS 2 x EOB to RW with min assist + vcs for technique.  Ambulation/Gait             General Gait Details: Deferred d/t increased SOB and work of breathing with standing EOB. limited by vent. will try to treat when pt is on trach collar/ [place]   Stairs             Wheelchair Mobility    Modified Rankin (Stroke Patients Only)       Balance Overall balance assessment: Needs assistance Sitting-balance support: No upper extremity supported;Feet supported Sitting balance-Leahy Scale: Good Sitting balance - Comments: steady sitting reaching outside BOS   Standing balance support: Bilateral upper extremity supported Standing balance-Leahy Scale: Good Standing balance comment: pt has good balance with standing with BUE support. was able to perform SLS activities in standing                            Cognition Arousal/Alertness: Awake/alert Behavior During Therapy: WFL for tasks assessed/performed Overall Cognitive Status: Within Functional Limits for tasks assessed                                 General Comments: Therapist able to easily read pt's  lips when he mouths word. He was on vent throughout session and was cooperative and motivated throughout      Exercises General Exercises - Lower Extremity Ankle Circles/Pumps: AROM;20 reps;Both Quad Sets: AROM;Both;10 reps;Supine Gluteal Sets: AROM;10 reps Long Arc Quad: AROM;Both;Seated;10 reps Straight Leg Raises: AROM;Both;10 reps Hip Flexion/Marching: AROM;Seated;Both;15 reps Other Exercises Other Exercises: Pt was able to march in standing alternating 2 x 20 with BUe support on RW    General Comments        Pertinent Vitals/Pain  Pain Assessment: No/denies pain    Home Living                      Prior Function            PT Goals (current goals can now be found in the care plan section) Acute Rehab PT Goals Patient Stated Goal: " go home" Progress towards PT goals: Progressing toward goals    Frequency    Min 2X/week      PT Plan Current plan remains appropriate    Co-evaluation              AM-PAC PT "6 Clicks" Mobility   Outcome Measure  Help needed turning from your back to your side while in a flat bed without using bedrails?: A Little Help needed moving from lying on your back to sitting on the side of a flat bed without using bedrails?: A Little Help needed moving to and from a bed to a chair (including a wheelchair)?: A Little Help needed standing up from a chair using your arms (e.g., wheelchair or bedside chair)?: A Lot Help needed to walk in hospital room?: Total Help needed climbing 3-5 steps with a railing? : Total 6 Click Score: 13    End of Session Equipment Utilized During Treatment: Oxygen;Other (comment);Gait belt(vent) Activity Tolerance: Patient tolerated treatment well;Patient limited by fatigue Patient left: in bed;with call bell/phone within reach;with bed alarm set Nurse Communication: Mobility status PT Visit Diagnosis: Unsteadiness on feet (R26.81);Muscle weakness (generalized) (M62.81);History of falling (Z91.81);Other abnormalities of gait and mobility (R26.89)     Time: 1425-1450 PT Time Calculation (min) (ACUTE ONLY): 25 min  Charges:  $Therapeutic Exercise: 8-22 mins $Therapeutic Activity: 8-22 mins                     Julaine Fusi PTA 07/03/19, 3:12 PM

## 2019-07-03 NOTE — Progress Notes (Signed)
PMV placed on trach. Pt able to sip ensure. Requesting applesauce, will reach out to speech therapy for guidance.

## 2019-07-03 NOTE — Progress Notes (Signed)
Patient experiencing an excessive increase in his WOB. Shelbie Hutching was removed first but patient was still very SOB. Patient was then placed back on ventilator and I-time adjusted to meet his demand. RN gave patient something to help him relax and another neb treatment was administered PRN due to excessive expiratory wheezes. Patient did begin to calm before RT left the bedside.

## 2019-07-03 NOTE — Progress Notes (Signed)
CRITICAL CARE NOTE  49 year old former smoker with a history very severe COPD stage IV, who presented via EMS after being found down and unresponsive.  Apparently the patient's mother placed a call to EMS.  The patient has a longstanding history of COPD and noncompliance with his oxygen and CPAP.  History could not be obtained from the patient as there was no family available and the patient had been intubated and was mechanically ventilated in the ED.  Of note he had recently been admitted to [facility] 29 May 2019 through 03 June 2019.  He had been evaluated by palliative care during this admission and actually had a DNR order.   Patient also had a 2D echo  with pulmonary hypertension consistent with cor pulmonale.  Records review: Patient has been following with Dr. Ned Clines at [facility].  Last known PFTs 11 September 2017.  FEV1 0.52 L or 20% predicted, FVC 1.49 L or 45% of predicted, FEV1/FVC 35%.  FEF 25-75 6%.  DLCO was 33%.  This is consistent with very severe COPD.  Events: 4/13 admitted for severe resp failure from COPD 4/14 remains critically ill on vent 4/15 remains critically ill, severe COPD, failed SAT/SBT 4/16 extubated yesterday, had to be reintubated remains on ventilator 4/17 sedated on the ventilator 4/18 abdominal distention, CT abdomen and pelvis consistent with ileus and constipation 4/19-patietn remains on ventialtor awainting trach 4/20 - patient is on vent with trach, he had episodes of aggitation today but eventually improved  4/21 - patient with significant distension of abdomen, concern for aerophagia in NIV.  There is mucopurulent exudation from trache this was cultures and patient placed on Unasyn for tracheitis 06/24/19 -trache site improved with less malodorous pus draining, NGT with decompression worked well abdomen is less distended. S/p PEG 06/25/19- patient had some anxiety throughout day today, we have started low dose anxiolytic. His PEG is now being  utilized. He has some placement issues with social work due to insurance and we are working on this now.  06/26/19-Patient had no events today, we are waiting for placement now from transitional care team 06/27/19- Patient is calm this morining, abdomen is mildy distended, awaiting placement to [facility] 4/26 plan for PS/TCT 4/27 Pt failed trach collar twice today. Became visibly distressed, diaphoretic, o2 drops, and accessory muscle use.  4/28 increased WOB, resp distress, using accessory muscles to breath while on vent 4/29 patient placed on PS mode, given BD therapy, alert and awake, was able to communicate with PM valve 07/02/19 -trache changed to cuffed size 6 without incident, patient reports more comfort with improved vocalization.  07/03/19- no overnight events, patient sitting up in bed in no distress.    CC  Severe COPD Severe resp failure     BP 104/66   Pulse 88   Temp 99 F (37.2 C) (Oral)   Resp 13   Ht 5\' 6"  (1.676 m)   Wt 57.5 kg   SpO2 99%   BMI 20.46 kg/m    I/O last 3 completed shifts: In: 1189.9 [I.V.:1090; IV Piggyback:99.9] Out: 3250 [Urine:3250] Total I/O In: -  Out: 225 [Urine:225]  SpO2: 99 % O2 Flow Rate (L/min): 2 L/min FiO2 (%): 28 %  Estimated body mass index is 20.46 kg/m as calculated from the following:   Height as of this encounter: 5\' 6"  (1.676 m).   Weight as of this encounter: 57.5 kg.  REVIEW OF SYSTEMS  10 point ROS done and is neg except as per subjective findings  PHYSICAL EXAMINATION: Physical Exam  HENT:  Head: Normocephalic and atraumatic.    Cardiovascular: Regular rhythm.  Pulmonary/Chest: Breath sounds normal. No accessory muscle usage. Tachypnea noted. No respiratory distress.  Abdominal: Bowel sounds are normal. He exhibits no distension, no fluid wave and no ascites. There is no abdominal tenderness.  Neurological: He is alert.       MEDICATIONS: I have reviewed all medications and confirmed regimen as  documented   CULTURE RESULTS   Recent Results (from the past 240 hour(s))  Aerobic Culture (superficial specimen)     Status: None   Collection Time: 06/23/19  4:55 PM   Specimen: Neck  Result Value Ref Range Status   Specimen Description   Final    NECK Performed at [facility], [address] [place], [place] [address]    Special Requests   Final    NONE Performed at [facility], [address] [place], [place] [address]    Gram Stain   Final    NO WBC SEEN ABUNDANT GRAM POSITIVE COCCI ABUNDANT GRAM NEGATIVE RODS ABUNDANT GRAM POSITIVE RODS    Culture   Final    NORMAL OROPHARYNGEAL FLORA Performed at [facility], [address] [place], [place] [address]    Report Status 06/26/2019 FINAL  Final        BMP Latest Ref Rng & Units 07/03/2019 07/02/2019 07/01/2019  Glucose 70 - 99 mg/dL 163(W) 466(Z) 993(T)  BUN 6 - 20 mg/dL 14 15 12   Creatinine 0.61 - 1.24 mg/dL ) 7.01(X) <7.93(J)  Sodium 135 - 145 mmol/L 132(L) 138 138  Potassium 3.5 - 5.1 mmol/L 3.7 4.1 4.3  Chloride 98 - 111 mmol/L 91(L) 91(L) 100  CO2 22 - 32 mmol/L 34(H) 31 33(H)  Calcium 8.9 - 10.3 mg/dL 8.2(L) 8.2(L) 8.3(L)         Indwelling Urinary Catheter continued, requirement due to   Reason to continue Indwelling Urinary Catheter strict Intake/Output monitoring for hemodynamic instability         Ventilator continued, requirement due to severe respiratory failure   Ventilator Sedation RASS 0 to -2      ASSESSMENT AND PLAN   Acute on chronic hypercarbic/hypoxic respiratory failure COPD exacerbation s/ptracheostomy -mucopurulent exudation has resolved post antibiotics Bronchodilators: DuoNeb every 4 and as needed  There continues to be no indication for antibiotics at present  Severe COPD stage IV  Noted noncompliance with oxygen/BiPAP at home -continue LAMA/LABA/ICS   Tracheitis associated with tracheostomy--resolved -S/p culture of  mucopurulent exudate-abundant GPC and GPR consistent with overgrowth of flora -s/p Unasyn with significant improvement in discharge and malodorous smell - completed full course unasyn and s/p erythromycin    Acute encephalopathy-resolved Unresponsiveness Due to hypercarbia    Hypoglycemia-resolved Due to poor nutritional status Continue tube feeds Add D10 ICU hypoglycemia protocol Poor prognostic sign  Acute kidney injury  Avoid nephrotoxins Stable with no worsening Trend renal panel  Hyperkalemia Likely triggered by acidosis RESOLVED  ID Procalcitonin normal No infiltrate on chest x-ray No indication for antibiotics at present Continue to trend WBC/fever curve Had 1 single blood culture of MRSA likely contaminant [name] MRSA PCR negative  Abdominal distention Ileus CT abdomen and pelvis: No obstruction: No free air Findings likely due to ileus, constipation Bowel regimen  Prophylaxis: Lovenox Famotidine    Critical care provider statement:    Critical care time (minutes):  35   Critical care time was exclusive of:  Separately billable procedures and  treating  other patients   Critical care was necessary to treat or prevent imminent or  life-threatening deterioration of the following conditions:  Acute hypercapnic respiratory failure requiring tracheostomy   Critical care was time spent personally by me on the following  activities:  Development of treatment plan with patient or surrogate,  discussions with consultants, evaluation of patient's response to  treatment, examination of patient, obtaining history from patient or  surrogate, ordering and performing treatments and interventions, ordering  and review of laboratory studies and re-evaluation of patient's condition   I assumed direction of critical care for this patient from another  provider in my specialty: no       Ottie Glazier, M.D.  Pulmonary & [place]

## 2019-07-03 NOTE — Progress Notes (Signed)
  Speech Language Pathology Treatment: Dysphagia  Patient Details Name: Chris Case MRN: [id number] DOB: 1970/06/14 Today's Date: 07/03/2019 Time: 0086-7619 SLP Time Calculation (min) (ACUTE ONLY): 35 min  Assessment / Plan / Recommendation Clinical Impression  Pt was seen today for ongoing assessment of swallowing and few tsp trials of puree, applesauce. Noted pt's HR at baseline upon entering room was 114, min elevated than at other sessions this week. Pt is off vent w/ wean w/ [place] O2 support; PMV placed. Pt continues to present w/ increased inspiratory effort during his wean and PMV wear; easy to fatigue w/ any exertion/task. Pt appeared to adequately tolerate 5 tsp trials of applesauce, then single ice chips w/ no immediate decline in pulmonary status -- no change in O2 sats (98%), RR remained in low 20s as at baseline. Vocal quality was clear b/t trials.  Due to pt's continued weakness and inconsistent toleration of wean during the day requiring vent support, recommend continue w/ PEG feedings as primary source and po's of ice chips and applesauce for pleasure; monitor any sips of Ensure if requested. A MBSS is recommended next week. NSG updated. Pt agreed.     HPI HPI: Pt is a 49 year old former smoker with a history very severe COPD stage IV, who presented via EMS after being found down and unresponsive.  Apparently the patient's mother placed a call to EMS.  The patient has a longstanding history of COPD and noncompliance with his oxygen and CPAP.  History could not be obtained from the patient as there was no family available and the patient had been intubated and was mechanically ventilated in the ED.  Of note, he had recently been admitted to [facility] 29 May 2019 through 03 June 2019.  Patient s/p tracheostomy tube placement on 4/20;  s/p robotic assisted laparoscopic G-tube placement with extensive lysis of adhesions on 4/22.  Severe Malnutrition related to chronic illness(COPD) as evidenced  by severe muscle and fat depletion per Dietician note.       SLP Plan  Continue with current plan of care       Recommendations  Diet recommendations: (pleasure bites and sips when on PMV and tolerating) Liquids provided via: (ice chips by TSP) Medication Administration: Via alternative means(PEG placement) Supervision: Patient able to self feed Compensations: Minimize environmental distractions;Slow rate;Small sips/bites Postural Changes and/or Swallow Maneuvers: Seated upright 90 degrees;Upright 30-60 min after meal                General recommendations: (Dietician following) Oral Care Recommendations: Oral care BID;Patient independent with oral care;Oral care prior to ice chip/H20;Oral care before and after PO Follow up Recommendations: (TBD) SLP Visit Diagnosis: Dysphagia, oropharyngeal phase (R13.12)(tracheostomy; PEG) Plan: Continue with current plan of care       GO                 Jerilynn Som, MS, CCC-SLP Casten Floren 07/03/2019, 12:49 PM

## 2019-07-04 LAB — BASIC METABOLIC PANEL
Anion gap: 6 (ref 5–15)
Anion gap: 9 (ref 5–15)
BUN: 12 mg/dL (ref 6–20)
BUN: 12 mg/dL (ref 6–20)
CO2: 35 mmol/L — ABNORMAL HIGH (ref 22–32)
CO2: 35 mmol/L — ABNORMAL HIGH (ref 22–32)
Calcium: 7.7 mg/dL — ABNORMAL LOW (ref 8.9–10.3)
Calcium: 8.3 mg/dL — ABNORMAL LOW (ref 8.9–10.3)
Chloride: 97 mmol/L — ABNORMAL LOW (ref 98–111)
Chloride: 95 mmol/L — ABNORMAL LOW (ref 98–111)
Creatinine, Ser: 0.42 mg/dL — ABNORMAL LOW (ref 0.61–1.24)
Creatinine, Ser: 0.39 mg/dL — ABNORMAL LOW (ref 0.61–1.24)
GFR calc Af Amer: >60 mL/min (ref >60)
GFR calc Af Amer: >60 mL/min (ref >60)
GFR calc non Af Amer: >60 mL/min (ref >60)
GFR calc non Af Amer: >60 mL/min (ref >60)
Glucose, Bld: 454 mg/dL — ABNORMAL HIGH (ref 70–99)
Glucose, Bld: 118 mg/dL — ABNORMAL HIGH (ref 70–99)
Potassium: 3.6 mmol/L (ref 3.5–5.1)
Potassium: 4 mmol/L (ref 3.5–5.1)
Sodium: 138 mmol/L (ref 135–145)
Sodium: 139 mmol/L (ref 135–145)

## 2019-07-04 LAB — CBC WITH DIFFERENTIAL/PLATELET
Abs Immature Granulocytes: 0.11 10*3/uL — ABNORMAL HIGH (ref 0.00–0.07)
Basophils Absolute: 0 10*3/uL (ref 0.0–0.1)
Basophils Relative: 0 %
Eosinophils Absolute: 0.3 10*3/uL (ref 0.0–0.5)
Eosinophils Relative: 3 %
HCT: 27.7 % — ABNORMAL LOW (ref 39.0–52.0)
Hemoglobin: 8.5 g/dL — ABNORMAL LOW (ref 13.0–17.0)
Immature Granulocytes: 1 %
Lymphocytes Relative: 14 %
Lymphs Abs: 1.2 10*3/uL (ref 0.7–4.0)
MCH: 30.9 pg (ref 26.0–34.0)
MCHC: 30.7 g/dL (ref 30.0–36.0)
MCV: 100.7 fL — ABNORMAL HIGH (ref 80.0–100.0)
Monocytes Absolute: 0.8 10*3/uL (ref 0.1–1.0)
Monocytes Relative: 9 %
Neutro Abs: 6.5 10*3/uL (ref 1.7–7.7)
Neutrophils Relative %: 73 %
Platelets: 164 10*3/uL (ref 150–400)
RBC: 2.75 MIL/uL — ABNORMAL LOW (ref 4.22–5.81)
RDW: 13.2 % (ref 11.5–15.5)
WBC: 9 10*3/uL (ref 4.0–10.5)
nRBC: 0 % (ref 0.0–0.2)

## 2019-07-04 LAB — MAGNESIUM: Magnesium: 1.6 mg/dL — ABNORMAL LOW (ref 1.7–2.4)

## 2019-07-04 LAB — GLUCOSE, CAPILLARY
Glucose-Capillary: 100 mg/dL — ABNORMAL HIGH (ref 70–99)
Glucose-Capillary: 107 mg/dL — ABNORMAL HIGH (ref 70–99)
Glucose-Capillary: 108 mg/dL — ABNORMAL HIGH (ref 70–99)
Glucose-Capillary: 115 mg/dL — ABNORMAL HIGH (ref 70–99)
Glucose-Capillary: 118 mg/dL — ABNORMAL HIGH (ref 70–99)
Glucose-Capillary: 124 mg/dL — ABNORMAL HIGH (ref 70–99)

## 2019-07-04 LAB — PHOSPHORUS: Phosphorus: 4 mg/dL (ref 2.5–4.6)

## 2019-07-04 MED ORDER — MAGNESIUM SULFATE 2 GM/50ML IV SOLN
2.0000 g | Freq: Once | INTRAVENOUS | Status: AC
  Administered 2019-07-04: 2 g via INTRAVENOUS
  Filled 2019-07-04: qty 50

## 2019-07-04 NOTE — Progress Notes (Signed)
Pt tol approx 15 min TC wean. Pt desat <80% pt placed back on vent with resting settings.

## 2019-07-04 NOTE — Progress Notes (Signed)
PHARMACY CONSULT NOTE  Pharmacy Consult for Electrolyte Monitoring and Replacement   Recent Labs: Potassium (mmol/L)  Date Value  07/04/2019 4.0  09/24/2013 4.6   Magnesium (mg/dL)  Date Value  16/12/9602 1.6 (L)   Calcium (mg/dL)  Date Value  54/11/8117 8.3 (L)   Calcium, Total (mg/dL)  Date Value  14/78/2956 8.9   Albumin (g/dL)  Date Value  21/30/8657 3.5   Phosphorus (mg/dL)  Date Value  84/69/6295 4.0   Sodium (mmol/L)  Date Value  07/04/2019 139  09/24/2013 138     Assessment: 49 year old male admitted after being found unresponsive and admitted with end stage COPD. Patient was intubated in the ED and transferred to the ICU. Patient started on tube feeds and at refeeding risk.   Patient extubated 4/15 am and placed on BiPAP. Patient required reintubation. Currently post-tracheostomy.  Trach 4/20, G-tube placement 4/22.  Electrolytes: Will replace to maintain K ~4 and mag ~2.     Magnesium 2 g IV x 1.  Will f/u with am labs.  Glucose: Currently with consistently low CBG. On D10 at 50 mL/hr with NS to avoid hyponatremia. Currently off steroids.  Patient is receiving Osmolite 1.5 VT at 60 mL/hr, as well as Juven 1 packet VT BID between meals.  Will continue to monitor response.  Constipation: LBM 4/29 and currently receiving senna-docusate 2 tab VT BID and miralax daily.    Pharmacy will continue to monitor and adjust per consult.   Pricilla Riffle, PharmD 07/04/2019 7:30 AM

## 2019-07-04 NOTE — Plan of Care (Signed)
  Problem: Education: Goal: Knowledge of General Education information will improve Description: Including pain rating scale, medication(s)/side effects and non-pharmacologic comfort measures Outcome: Progressing   Problem: Health Behavior/Discharge Planning: Goal: Ability to manage health-related needs will improve Outcome: Progressing   Problem: Clinical Measurements: Goal: Ability to maintain clinical measurements within normal limits will improve Outcome: Progressing Goal: Will remain free from infection Outcome: Progressing Goal: Diagnostic test results will improve Outcome: Progressing Goal: Respiratory complications will improve Outcome: Progressing Goal: Cardiovascular complication will be avoided Outcome: Progressing   Problem: Nutrition: Goal: Adequate nutrition will be maintained Outcome: Progressing   Problem: Elimination: Goal: Will not experience complications related to bowel motility Outcome: Progressing Goal: Will not experience complications related to urinary retention Outcome: Progressing   Problem: Pain Managment: Goal: General experience of comfort will improve Outcome: Progressing   Problem: Safety: Goal: Ability to remain free from injury will improve Outcome: Progressing   Problem: Skin Integrity: Goal: Risk for impaired skin integrity will decrease Outcome: Progressing   Problem: Activity: Goal: Ability to tolerate increased activity will improve Outcome: Progressing   Problem: Respiratory: Goal: Ability to maintain a clear airway and adequate ventilation will improve Outcome: Progressing   Problem: Role Relationship: Goal: Method of communication will improve Outcome: Progressing   Problem: Education: Goal: Knowledge of disease or condition will improve Outcome: Progressing Goal: Knowledge of the prescribed therapeutic regimen will improve Outcome: Progressing Goal: Individualized Educational Video(s) Outcome: Progressing    Problem: Activity: Goal: Ability to tolerate increased activity will improve Outcome: Progressing Goal: Will verbalize the importance of balancing activity with adequate rest periods Outcome: Progressing   Problem: Respiratory: Goal: Ability to maintain a clear airway will improve Outcome: Progressing Goal: Levels of oxygenation will improve Outcome: Progressing Goal: Ability to maintain adequate ventilation will improve Outcome: Progressing   Problem: Education: Goal: Ability to state activities that reduce stress will improve Outcome: Progressing   Problem: Coping: Goal: Ability to identify and develop effective coping behavior will improve Outcome: Progressing   Problem: Self-Concept: Goal: Ability to identify factors that promote anxiety will improve Outcome: Progressing Goal: Level of anxiety will decrease Outcome: Progressing Goal: Ability to modify response to factors that promote anxiety will improve Outcome: Progressing

## 2019-07-04 NOTE — Progress Notes (Signed)
CRITICAL CARE NOTE  49 year old former smoker with a history very severe COPD stage IV, who presented via EMS after being found down and unresponsive.  Apparently the patient's mother placed a call to EMS.  The patient has a longstanding history of COPD and noncompliance with his oxygen and CPAP.  History could not be obtained from the patient as there was no family available and the patient had been intubated and was mechanically ventilated in the ED.  Of note he had recently been admitted to [facility] 29 May 2019 through 03 June 2019.  He had been evaluated by palliative care during this admission and actually had a DNR order.   Patient also had a 2D echo  with pulmonary hypertension consistent with cor pulmonale.  Records review: Patient has been following with Dr. Wallene Huh at [facility].  Last known PFTs 11 September 2017.  FEV1 0.52 L or 20% predicted, FVC 1.49 L or 45% of predicted, FEV1/FVC 35%.  FEF 25-75 6%.  DLCO was 33%.  This is consistent with very severe COPD.  Events: 4/13 admitted for severe resp failure from COPD 4/14 remains critically ill on vent 4/15 remains critically ill, severe COPD, failed SAT/SBT 4/16 extubated yesterday, had to be reintubated remains on ventilator 4/17 sedated on the ventilator 4/18 abdominal distention, CT abdomen and pelvis consistent with ileus and constipation 4/19-patietn remains on ventialtor awainting trach 4/20 - patient is on vent with trach, he had episodes of aggitation today but eventually improved  4/21 - patient with significant distension of abdomen, concern for aerophagia in NIV.  There is mucopurulent exudation from trache this was cultures and patient placed on Unasyn for tracheitis 06/24/19 -trache site improved with less malodorous pus draining, NGT with decompression worked well abdomen is less distended. S/p PEG 06/25/19- patient had some anxiety throughout day today, we have started low dose anxiolytic. His PEG is now being  utilized. He has some placement issues with social work due to insurance and we are working on this now.  06/26/19-Patient had no events today, we are waiting for placement now from transitional care team 06/27/19- Patient is calm this morining, abdomen is mildy distended, awaiting placement to [facility] 4/26 plan for PS/TCT 4/27 Pt failed trach collar twice today. Became visibly distressed, diaphoretic, o2 drops, and accessory muscle use.  4/28 increased WOB, resp distress, using accessory muscles to breath while on vent 4/29 patient placed on PS mode, given BD therapy, alert and awake, was able to communicate with PM valve 07/02/19 -trache changed to cuffed size 6 without incident, patient reports more comfort with improved vocalization.  07/03/19- no overnight events, patient sitting up in bed in no distress.    CC  Severe COPD Severe resp failure     BP 96/65   Pulse 98   Temp 98.7 F (37.1 C) (Axillary)   Resp 16   Ht 5\' 6"  (1.676 m)   Wt 57.5 kg   SpO2 96%   BMI 20.46 kg/m    I/O last 3 completed shifts: In: 862.8 [P.O.:240; I.V.:622.8] Out: 3075 [Urine:3075] Total I/O In: 99.9 [I.V.:63.7; IV Piggyback:36.2] Out: 300 [Urine:300]  SpO2: 96 % O2 Flow Rate (L/min): 2 L/min FiO2 (%): 28 %  Estimated body mass index is 20.46 kg/m as calculated from the following:   Height as of this encounter: 5\' 6"  (1.676 m).   Weight as of this encounter: 57.5 kg.  REVIEW OF SYSTEMS  10 point ROS done and is neg except as per subjective  findings  PHYSICAL EXAMINATION: Physical Exam  HENT:  Head: Normocephalic and atraumatic.    Cardiovascular: Regular rhythm.  Pulmonary/Chest: Breath sounds normal. No accessory muscle usage. Tachypnea noted. No respiratory distress.  Abdominal: Bowel sounds are normal. He exhibits no distension, no fluid wave and no ascites. There is no abdominal tenderness.  Neurological: He is alert.       MEDICATIONS: I have reviewed all medications and  confirmed regimen as documented   CULTURE RESULTS   No results found for this or any previous visit (from the past 240 hour(s)).      BMP Latest Ref Rng & Units 07/04/2019 07/04/2019 07/03/2019  Glucose 70 - 99 mg/dL 403(K) 742(V) 956(L)  BUN 6 - 20 mg/dL 12 12 14   Creatinine 0.61 - 1.24 mg/dL ) 8.75(I) 4.33(I)  Sodium 135 - 145 mmol/L 139 138 132(L)  Potassium 3.5 - 5.1 mmol/L 4.0 3.6 3.7  Chloride 98 - 111 mmol/L 95(L) 97(L) 91(L)  CO2 22 - 32 mmol/L 35(H) 35(H) 34(H)  Calcium 8.9 - 10.3 mg/dL 8.3(L) 7.7(L) 8.2(L)         Indwelling Urinary Catheter continued, requirement due to   Reason to continue Indwelling Urinary Catheter strict Intake/Output monitoring for hemodynamic instability         Ventilator continued, requirement due to severe respiratory failure   Ventilator Sedation RASS 0 to -2      ASSESSMENT AND PLAN   Acute on chronic hypercarbic/hypoxic respiratory failure COPD exacerbation s/ptracheostomy -mucopurulent exudation has resolved post antibiotics Bronchodilators: DuoNeb every 4 and as needed   Severe COPD stage IV  Noted noncompliance with oxygen/BiPAP at home -continue LAMA/LABA/ICS   Tracheitis associated with tracheostomy--resolved -S/p culture of mucopurulent exudate-abundant GPC and GPR consistent with overgrowth of flora -s/p Unasyn with significant improvement in discharge and malodorous smell - completed full course unasyn and s/p erythromycin    Acute encephalopathy-resolved Unresponsiveness Due to hypercarbia    Hypoglycemia-resolved Due to poor nutritional status Continue tube feeds Add D10 ICU hypoglycemia protocol Poor prognostic sign  Acute kidney injury  Avoid nephrotoxins Stable with no worsening Trend renal panel  Hyperkalemia Likely triggered by acidosis RESOLVED  ID Procalcitonin normal No infiltrate on chest x-ray No indication for antibiotics at present Continue to trend WBC/fever curve Had 1  single blood culture of MRSA likely contaminant [name] MRSA PCR negative  Abdominal distention Ileus CT abdomen and pelvis: No obstruction: No free air Findings likely due to ileus, constipation Bowel regimen  Prophylaxis: Lovenox Famotidine    Critical care provider statement:    Critical care time (minutes):  35   Critical care time was exclusive of:  Separately billable procedures and  treating other patients   Critical care was necessary to treat or prevent imminent or  life-threatening deterioration of the following conditions:  Acute hypercapnic respiratory failure requiring tracheostomy   Critical care was time spent personally by me on the following  activities:  Development of treatment plan with patient or surrogate,  discussions with consultants, evaluation of patient's response to  treatment, examination of patient, obtaining history from patient or  surrogate, ordering and performing treatments and interventions, ordering  and review of laboratory studies and re-evaluation of patient's condition   I assumed direction of critical care for this patient from another  provider in my specialty: no       9.51(O, M.D.  Pulmonary & Critical Care Medicine  [facility]

## 2019-07-04 NOTE — Progress Notes (Addendum)
Shift summary:  - Patient presenting with respiratory distress this AM (labored breathing, sweating).  - Will attempt TCT when patient's respiratory status is more stable.  - TCT this AM was not tolerated well. Patient unable to maintain SPO2 > 60%. Patient placed back on vent.  - TCT this afternoon is tolerated much better. Patient is wearing PMV as much as tolerable, eating small bites of applesauce with supervision.

## 2019-07-05 DIAGNOSIS — J9601 Acute respiratory failure with hypoxia: Secondary | ICD-10-CM

## 2019-07-05 DIAGNOSIS — J441 Chronic obstructive pulmonary disease with (acute) exacerbation: Secondary | ICD-10-CM | POA: Diagnosis not present

## 2019-07-05 LAB — BASIC METABOLIC PANEL
Anion gap: 6 (ref 5–15)
BUN: 15 mg/dL (ref 6–20)
CO2: 39 mmol/L — ABNORMAL HIGH (ref 22–32)
Calcium: 8.4 mg/dL — ABNORMAL LOW (ref 8.9–10.3)
Chloride: 93 mmol/L — ABNORMAL LOW (ref 98–111)
Creatinine, Ser: 0.37 mg/dL — ABNORMAL LOW (ref 0.61–1.24)
GFR calc Af Amer: >60 mL/min (ref >60)
GFR calc non Af Amer: >60 mL/min (ref >60)
Glucose, Bld: 91 mg/dL (ref 70–99)
Potassium: 4.3 mmol/L (ref 3.5–5.1)
Sodium: 138 mmol/L (ref 135–145)

## 2019-07-05 LAB — PHOSPHORUS: Phosphorus: 4.1 mg/dL (ref 2.5–4.6)

## 2019-07-05 LAB — CBC WITH DIFFERENTIAL/PLATELET
Abs Immature Granulocytes: 0.08 10*3/uL — ABNORMAL HIGH (ref 0.00–0.07)
Basophils Absolute: 0 10*3/uL (ref 0.0–0.1)
Basophils Relative: 0 %
Eosinophils Absolute: 0.3 10*3/uL (ref 0.0–0.5)
Eosinophils Relative: 3 %
HCT: 28.5 % — ABNORMAL LOW (ref 39.0–52.0)
Hemoglobin: 8.5 g/dL — ABNORMAL LOW (ref 13.0–17.0)
Immature Granulocytes: 1 %
Lymphocytes Relative: 13 %
Lymphs Abs: 1.3 10*3/uL (ref 0.7–4.0)
MCH: 30.4 pg (ref 26.0–34.0)
MCHC: 29.8 g/dL — ABNORMAL LOW (ref 30.0–36.0)
MCV: 101.8 fL — ABNORMAL HIGH (ref 80.0–100.0)
Monocytes Absolute: 0.8 10*3/uL (ref 0.1–1.0)
Monocytes Relative: 8 %
Neutro Abs: 7.4 10*3/uL (ref 1.7–7.7)
Neutrophils Relative %: 75 %
Platelets: 162 10*3/uL (ref 150–400)
RBC: 2.8 MIL/uL — ABNORMAL LOW (ref 4.22–5.81)
RDW: 13.2 % (ref 11.5–15.5)
WBC: 9.9 10*3/uL (ref 4.0–10.5)
nRBC: 0 % (ref 0.0–0.2)

## 2019-07-05 LAB — GLUCOSE, CAPILLARY
Glucose-Capillary: 80 mg/dL (ref 70–99)
Glucose-Capillary: 98 mg/dL (ref 70–99)
Glucose-Capillary: 97 mg/dL (ref 70–99)
Glucose-Capillary: 134 mg/dL — ABNORMAL HIGH (ref 70–99)
Glucose-Capillary: 149 mg/dL — ABNORMAL HIGH (ref 70–99)

## 2019-07-05 LAB — MAGNESIUM: Magnesium: 2.1 mg/dL (ref 1.7–2.4)

## 2019-07-05 MED ORDER — HYDROCORTISONE NA SUCCINATE PF 100 MG IJ SOLR
50.0000 mg | Freq: Four times a day (QID) | INTRAMUSCULAR | Status: DC
  Administered 2019-07-05 – 2019-07-09 (×17): 50 mg via INTRAVENOUS
  Filled 2019-07-05 (×17): qty 2

## 2019-07-05 MED ORDER — ALPRAZOLAM 0.25 MG PO TABS
0.2500 mg | ORAL_TABLET | Freq: Two times a day (BID) | ORAL | Status: DC
  Administered 2019-07-05 – 2019-07-06 (×3): 0.25 mg
  Filled 2019-07-05 (×4): qty 1

## 2019-07-05 MED ORDER — MIDAZOLAM HCL 2 MG/2ML IJ SOLN
4.0000 mg | INTRAMUSCULAR | Status: AC
  Administered 2019-07-05: 4 mg via INTRAVENOUS

## 2019-07-05 MED ORDER — MELATONIN 5 MG PO TABS
10.0000 mg | ORAL_TABLET | Freq: Every day | ORAL | Status: DC
  Administered 2019-07-05 – 2019-07-28 (×23): 10 mg
  Filled 2019-07-05 (×24): qty 2

## 2019-07-05 MED ORDER — MIDAZOLAM HCL 2 MG/2ML IJ SOLN
INTRAMUSCULAR | Status: AC
  Filled 2019-07-05: qty 4

## 2019-07-05 MED ORDER — FENTANYL CITRATE (PF) 100 MCG/2ML IJ SOLN: 100.0000 ug | INTRAMUSCULAR | Status: AC

## 2019-07-05 MED ORDER — FENTANYL CITRATE (PF) 100 MCG/2ML IJ SOLN
INTRAMUSCULAR | Status: AC
  Administered 2019-07-05: 100 ug via INTRAVENOUS
  Filled 2019-07-05: qty 2

## 2019-07-05 NOTE — Progress Notes (Signed)
Patient desated as low as 23 while still on pressure control on vent- patient was suctioned and minimal secretions came out.  Patient using accessory muscles with each breath-Per Dr. Belia Heman- patient given fentanyl and versed.  Patient resting at this time.

## 2019-07-05 NOTE — Progress Notes (Signed)
Nutrition Follow-up  RD working remotely.  DOCUMENTATION CODES:   Severe malnutrition in context of chronic illness  INTERVENTION:  Continue Osmolite 1.5 Cal at 60 mL/hr (1440 mL goal daily volume). Provides 2160 kcal, 90 grams of protein, 1094 mL H2O daily.  Goal TF regimen meets 100% RDIs for vitamins/minerals.  Continue Provide Juven 1 packet per tube BID. Each packet provides 95 kcal, 7 grams L-Arginine, 7 grams L-Glutamine, 2.5 grams collagen protein, 300 mg vitamin C, 9.5 mg zinc, and other micronutrients essential for wound healing.  Continue free water flush of 30 mL Q4hrs to maintain tube patency while patient is on IV fluids.  Once plan is to meet all hydration needs enterally per free water flush recommend a free water flush of 120 mL Q6hrs. Provides a total of 1574 mL H2O daily including water in tube feeding regimen.  NUTRITION DIAGNOSIS:   Severe Malnutrition related to chronic illness(COPD) as evidenced by severe fat depletion, severe muscle depletion.  Ongoing - addressing with TF regimen.  GOAL:   Patient will meet greater than or equal to 90% of their needs  Met with TF regimen.  MONITOR:   Labs, Weight trends, Vent status, TF tolerance, I & O's  REASON FOR ASSESSMENT:   Ventilator    ASSESSMENT:   49 year old male with PMHx of emphysema, COPD, hx TB admitted with acute COPD exacerbation requiring intubation.   -Patient s/p tracheostomy tube placement on 4/20. -Patient s/p robotic assisted laparoscopic G-tube placement with extensive lysis of adhesions on 4/22.  Discussed with RN via secure chat. Patient is tolerating goal TF regimen. Patient has been working with SLP. He was able to tolerate 5 tsp of applesauce and a single ice chip on 5/1. Plan is for pleasure bites and sips when on PMV and tolerating. Today patient having severe hypoxia and increased WOB so he was placed back on full vent support.  Patient is currently intubated on ventilator  support MV: 10.2 L/min Temp (24hrs), Avg:98.5 F (36.9 C), Min:98.2 F (36.8 C), Max:98.6 F (37 C)  Medications reviewed and include: famotidine, Solu-Cortef 50 mg Q6hrs IV, Miralax 17 grams daily, senna-docusate 2 tablets BID, D10 with sodium chloride 100 mEq at 50 mL/hr.  Labs reviewed: CBG 80-108, Chloride 93, CO2 39, Creatinine 0.37.  I/O: 1980 mL UOP Yesterday (1.4 mL/kg/hr)  Enteral Access: G-tube  Diet Order:   Diet Order            Diet NPO time specified  Diet effective midnight             EDUCATION NEEDS:   No education needs have been identified at this time  Skin:  Skin Assessment: Skin Integrity Issues:(stage 2 buttocks)  Last BM:  07/03/2019  Height:   Ht Readings from Last 1 Encounters:  06/15/19 '5\' 6"'  (1.676 m)   Weight:   Wt Readings from Last 1 Encounters:  07/01/19 57.5 kg   Ideal Body Weight:  64.5 kg  BMI:  Body mass index is 20.46 kg/m.  Estimated Nutritional Needs:   Kcal:  1900-2100  Protein:  85-100 grams  Fluid:  1.6-1.9 L/day  Jacklynn Barnacle, MS, RD, LDN Pager number available on Amion

## 2019-07-05 NOTE — Progress Notes (Signed)
PT Cancellation Note  Patient Details Name: Chris Case MRN: [id number] DOB: 03-19-1970   Cancelled Treatment:    Reason Eval/Treat Not Completed: Medical issues which prohibited therapy.  Nurse reports pt with respiratory concerns today (see 2 rapid response notes from today) and recommending letting pt rest.  Will re-attempt PT treatment session at a later date/time as medically appropriate.  Hendricks Limes, PT 07/05/19, 3:31 PM

## 2019-07-05 NOTE — Progress Notes (Signed)
Visit on yesterday 5/2 with patient and wife together. They shared stories of their life together and talked a lot about his dog who misses him and is not on regular diet because his owner isn't present. There were lots of laughter, patient continues to make progress in a mental sense.

## 2019-07-05 NOTE — TOC Progression Note (Addendum)
Transition of Care [facility]) - Progression Note    Patient Details  Name: Chris Case MRN: [id number] Date of Birth: 1970-06-15  [facility]) CM/SW Contact  Liliana Cline, LCSW Phone Number: 07/05/2019, 1:50 PM  Clinical Narrative:   CSW left voicemail for [employer] Representative Olegario Messier requesting follow up as Representative [name] reported he was working on getting overnight training set up. Left a voicemail requesting a return call.  Spoke to [employer] who is still following, not sure if they will be able to provide [facility] services when patient discharges or not.  CSW called [facility] to follow-up about referral made on Friday for possible home Aide/Nursing services at discharge. Spoke with [employer]. He reported he will have his Business Manager call CSW back about referral. Provided CSW contact information.   2:00- CSW received a return call from [employer]. Orpah Clinton reported patient would not qualify for CNA or any type of Aide services due to having a Trilogy and trach unless they wanted to privately pay, he says Medicaid would not cover these services. Orpah Clinton reported patient would qualify for RN services (up to 112 hours a week, but this would be the maximum and is not guaranteed) through Medicaid. Orpah Clinton reported this is about a 7-10 day process to get established and that they could start the process while patient is in the hospital. He was not sure about RN staffing in the [place] area and said they may also have to recruit for RN to cover patient's needs.       Expected Discharge Plan: Home w Home Health Services Barriers to Discharge: Continued Medical Work up  Expected Discharge Plan and Services Expected Discharge Plan: Home w Home Health Services     Post Acute Care Choice: Home Health Living arrangements for the past 2 months: Single Family Home                             HH Agency: [facility]     Representative spoke with at [facility] Agency: Feliberto Gottron   Social Determinants of Health (SDOH) Interventions    Readmission Risk Interventions Readmission Risk Prevention Plan 06/29/2019  Transportation Screening Complete  PCP or Specialist Appt within 3-5 Days Complete  HRI or Home Care Consult Complete  Palliative Care Screening Complete  Medication Review (RN Care Manager) Complete  Some recent data might be hidden

## 2019-07-05 NOTE — Progress Notes (Signed)
Patient had an episode of using accessory muscles to breath and his respirations were in the 40's- Patient communicated that he was having anxiety because of his trach.  Ativan was given and he is resting comfortably at this time.

## 2019-07-05 NOTE — Progress Notes (Addendum)
CRITICAL CARE NOTE  49 year old former smoker with a history very severe COPD stage IV, who presented via EMS after being found down and unresponsive.  Apparently the patient's mother placed a call to EMS.  The patient has a longstanding history of COPD and noncompliance with his oxygen and CPAP.  History could not be obtained from the patient as there was no family available and the patient had been intubated and was mechanically ventilated in the ED.  Of note he had recently been admitted to [facility] 29 May 2019 through 03 June 2019.  He had been evaluated by palliative care during this admission and actually had a DNR order.   Patient also had a 2D echo  with pulmonary hypertension consistent with cor pulmonale.  Records review: Patient has been following with Dr. Ned Clines at [facility].  Last known PFTs 11 September 2017.  FEV1 0.52 L or 20% predicted, FVC 1.49 L or 45% of predicted, FEV1/FVC 35%.  FEF 25-75 6%.  DLCO was 33%.  This is consistent with very severe COPD.  Events: 4/13 admitted for severe resp failure from COPD 4/14 remains critically ill on vent 4/15 remains critically ill, severe COPD, failed SAT/SBT 4/16 extubated yesterday, had to be reintubated remains on ventilator 4/17 sedated on the ventilator 4/18 abdominal distention, CT abdomen and pelvis consistent with ileus and constipation 4/19-patietn remains on ventialtor awainting trach 4/20 - patient is on vent with trach, he had episodes of aggitation today but eventually improved  4/21 - patient with significant distension of abdomen, concern for aerophagia in NIV.  There is mucopurulent exudation from trache this was cultures and patient placed on Unasyn for tracheitis 06/24/19 -trache site improved with less malodorous pus draining, NGT with decompression worked well abdomen is less distended. S/p PEG 06/25/19- patient had some anxiety throughout day today, we have started low dose anxiolytic. His PEG is now being  utilized. He has some placement issues with social work due to insurance and we are working on this now.  06/26/19-Patient had no events today, we are waiting for placement now from transitional care team 06/27/19- Patient is calm this morining, abdomen is mildy distended, awaiting placement to [facility] 4/26 plan for PS/TCT 4/27 Pt failed trach collar twice today. Became visibly distressed, diaphoretic, o2 drops, and accessory muscle use.  4/28 increased WOB, resp distress, using accessory muscles to breath while on vent 4/29 patient placed on PS mode, given BD therapy, alert and awake, was able to communicate with PM valve 07/02/19 -trache changed to cuffed size 6 without incident, patient reports more comfort with improved vocalization.  07/03/19- no overnight events, patient sitting up in bed in no distress.  5/3 severe hypoxia and increased WOB, placed back on full vent support   CC  Severe COPD  HPI Increased WOB Severe SOB Severe hypoxia +air trapping Back on full vent support       BP (!) 88/61   Pulse 82   Temp 98.6 F (37 C) (Oral)   Resp 17   Ht 5\' 6"  (1.676 m)   Wt 57.5 kg   SpO2 100%   BMI 20.46 kg/m    I/O last 3 completed shifts: In: 7255.9 [I.V.:1627; Other:135; ; IV Piggyback:50] Out: 3230 [Urine:3230] No intake/output data recorded.  SpO2: 100 % O2 Flow Rate (L/min): 2 L/min FiO2 (%): 28 %  Estimated body mass index is 20.46 kg/m as calculated from the following:   Height as of this encounter: 5\' 6"  (1.676 m).  Weight as of this encounter: 57.5 kg.  REVIEW OF SYSTEMS  PATIENT IS UNABLE TO PROVIDE COMPLETE REVIEW OF SYSTEM S DUE TO SEVERE CRITICAL ILLNESS AND ENCEPHALOPATHY  PHYSICAL EXAMINATION: Physical Exam  HENT:  Head: Normocephalic and atraumatic.    Cardiovascular: Regular rhythm.  Pulmonary/Chest: Accessory muscle usage present. Tachypnea noted. He is in respiratory distress. He has wheezes.  Abdominal: Bowel sounds are  normal. He exhibits distension. He exhibits no fluid wave and no ascites. There is no abdominal tenderness.  Genitourinary:    Rectum normal.   Neurological: He is alert.       MEDICATIONS: I have reviewed all medications and confirmed regimen as documented   CULTURE RESULTS   No results found for this or any previous visit (from the past 240 hour(s)).      BMP Latest Ref Rng & Units 07/05/2019 07/04/2019 07/04/2019  Glucose 70 - 99 mg/dL 91 118(H) 454(H)  BUN 6 - 20 mg/dL 15 12 12   Creatinine 0.61 - 1.24 mg/dL 0.37(L) 0.39(L) 0.42(L)  Sodium 135 - 145 mmol/L 138 139 138  Potassium 3.5 - 5.1 mmol/L 4.3 4.0 3.6  Chloride 98 - 111 mmol/L 93(L) 95(L) 97(L)  CO2 22 - 32 mmol/L 39(H) 35(H) 35(H)  Calcium 8.9 - 10.3 mg/dL 8.4(L) 8.3(L) 7.7(L)          Indwelling Urinary Catheter continued, requirement due to   Reason to continue Indwelling Urinary Catheter strict Intake/Output monitoring for hemodynamic instability   Central Line/ continued, requirement due to  Reason to continue [place] of central venous pressure or other hemodynamic parameters and poor IV access   Ventilator continued, requirement due to severe respiratory failure     ASSESSMENT AND PLAN  Severe ACUTE Hypoxic and Hypercapnic Respiratory Failure from end stage COPD -continue Mechanical Ventilator support -continue Bronchodilator Therapy -Wean Fio2 and PEEP as tolerated -VAP/VENT bundle implementation Unable to wean to PS mode  SEVERE COPD EXACERBATION -continue IV steroids as prescribed -continue NEB THERAPY as prescribed -morphine as needed -wean fio2 as needed and tolerated   GI GI PROPHYLAXIS as indicated  NUTRITIONAL STATUS DIET-->TF's as tolerated Constipation protocol as indicated   ELECTROLYTES -follow labs as needed -replace as needed -pharmacy consultation and following   Critical Care Time devoted to patient care services described in this note is 33 minutes.    Overall, patient is critically ill, prognosis is guarded.  Patient with Multiorgan failure and at high risk for cardiac arrest and death.   Patient is DNR  Corrin Parker, M.D.  [facility]  Medical Director [place] Director [facility] Cardio-Pulmonary Department

## 2019-07-05 NOTE — Progress Notes (Signed)
PHARMACY CONSULT NOTE  Pharmacy Consult for Electrolyte Monitoring and Replacement   Recent Labs: Potassium (mmol/L)  Date Value  07/05/2019 4.3  09/24/2013 4.6   Magnesium (mg/dL)  Date Value  76/19/5093 2.1   Calcium (mg/dL)  Date Value  26/71/2458 8.4 (L)   Calcium, Total (mg/dL)  Date Value  09/98/3382 8.9   Albumin (g/dL)  Date Value  50/53/9767 3.5   Phosphorus (mg/dL)  Date Value  34/19/3790 4.1   Sodium (mmol/L)  Date Value  07/05/2019 138  09/24/2013 138     Assessment: 49 year old male admitted after being found unresponsive and admitted with end stage COPD. Patient was intubated in the ED and transferred to the ICU. Patient started on tube feeds and at refeeding risk.   Patient extubated 4/15 am and placed on BiPAP. Patient required reintubation. Currently post-tracheostomy.  Trach 4/20, G-tube placement 4/22.  Electrolytes: Will replace to maintain K ~4 and mag ~2.     No Replacement warranted  Will f/u with am labs.  Glucose: Currently with consistently low CBG. On D10 at 50 mL/hr with NS to avoid hyponatremia. Hydrocortisone 50mg  IV Q6hr resumed. Patient is receiving Osmolite 1.5 VT at 60 mL/hr, as well as Juven 1 packet VT BID between meals.  Will continue to monitor response.  Constipation: LBM 5/1 and currently receiving senna-docusate 2 tab VT BID and miralax daily.    Pharmacy will continue to monitor and adjust per consult.   Arlee Bossard L, RPh 07/05/2019 3:38 PM

## 2019-07-06 DIAGNOSIS — J441 Chronic obstructive pulmonary disease with (acute) exacerbation: Secondary | ICD-10-CM | POA: Diagnosis not present

## 2019-07-06 LAB — CBC WITH DIFFERENTIAL/PLATELET
Abs Immature Granulocytes: 0.08 10*3/uL — ABNORMAL HIGH (ref 0.00–0.07)
Basophils Absolute: 0 10*3/uL (ref 0.0–0.1)
Basophils Relative: 0 %
Eosinophils Absolute: 0 10*3/uL (ref 0.0–0.5)
Eosinophils Relative: 0 %
HCT: 29.1 % — ABNORMAL LOW (ref 39.0–52.0)
Hemoglobin: 8.7 g/dL — ABNORMAL LOW (ref 13.0–17.0)
Immature Granulocytes: 1 %
Lymphocytes Relative: 4 %
Lymphs Abs: 0.4 10*3/uL — ABNORMAL LOW (ref 0.7–4.0)
MCH: 30.3 pg (ref 26.0–34.0)
MCHC: 29.9 g/dL — ABNORMAL LOW (ref 30.0–36.0)
MCV: 101.4 fL — ABNORMAL HIGH (ref 80.0–100.0)
Monocytes Absolute: 0.4 10*3/uL (ref 0.1–1.0)
Monocytes Relative: 4 %
Neutro Abs: 9.7 10*3/uL — ABNORMAL HIGH (ref 1.7–7.7)
Neutrophils Relative %: 91 %
Platelets: 165 10*3/uL (ref 150–400)
RBC: 2.87 MIL/uL — ABNORMAL LOW (ref 4.22–5.81)
RDW: 12.9 % (ref 11.5–15.5)
WBC: 10.6 10*3/uL — ABNORMAL HIGH (ref 4.0–10.5)
nRBC: 0 % (ref 0.0–0.2)

## 2019-07-06 LAB — BASIC METABOLIC PANEL
Anion gap: 10 (ref 5–15)
BUN: 17 mg/dL (ref 6–20)
CO2: 35 mmol/L — ABNORMAL HIGH (ref 22–32)
Calcium: 8.5 mg/dL — ABNORMAL LOW (ref 8.9–10.3)
Chloride: 90 mmol/L — ABNORMAL LOW (ref 98–111)
Creatinine, Ser: 0.4 mg/dL — ABNORMAL LOW (ref 0.61–1.24)
GFR calc Af Amer: >60 mL/min (ref >60)
GFR calc non Af Amer: >60 mL/min (ref >60)
Glucose, Bld: 124 mg/dL — ABNORMAL HIGH (ref 70–99)
Potassium: 4.4 mmol/L (ref 3.5–5.1)
Sodium: 135 mmol/L (ref 135–145)

## 2019-07-06 LAB — GLUCOSE, CAPILLARY
Glucose-Capillary: 112 mg/dL — ABNORMAL HIGH (ref 70–99)
Glucose-Capillary: 117 mg/dL — ABNORMAL HIGH (ref 70–99)
Glucose-Capillary: 127 mg/dL — ABNORMAL HIGH (ref 70–99)
Glucose-Capillary: 131 mg/dL — ABNORMAL HIGH (ref 70–99)
Glucose-Capillary: 138 mg/dL — ABNORMAL HIGH (ref 70–99)
Glucose-Capillary: 91 mg/dL (ref 70–99)

## 2019-07-06 LAB — PHOSPHORUS: Phosphorus: 3 mg/dL (ref 2.5–4.6)

## 2019-07-06 LAB — MAGNESIUM: Magnesium: 1.8 mg/dL (ref 1.7–2.4)

## 2019-07-06 MED ORDER — MAGNESIUM SULFATE 2 GM/50ML IV SOLN
2.0000 g | Freq: Once | INTRAVENOUS | Status: AC
  Administered 2019-07-06: 2 g via INTRAVENOUS
  Filled 2019-07-06: qty 50

## 2019-07-06 MED ORDER — ALPRAZOLAM 0.25 MG PO TABS
0.2500 mg | ORAL_TABLET | ORAL | Status: AC
  Administered 2019-07-06: 0.25 mg via ORAL

## 2019-07-06 NOTE — TOC Progression Note (Addendum)
Transition of Care [facility]) - Progression Note    Patient Details  Name: Sigmund Morera MRN: [id number] Date of Birth: 07-03-1970  [facility] CM/SW Contact  Liliana Cline, LCSW Phone Number: 07/06/2019, 1:21 PM  Clinical Narrative:   CSW was informed by [facility] Representative Barbara Cower that they will probably be able to provide PT and OT services if [facility] can provide RN services to patient when patient is discharged.  CSW reached out to patient's roommate, [name]. [name] is still agreeable to doing an overnight training as required by [employer] since she will be patient's caregiver at home. She has not heard any updates from [employer] about getting this set up yet. CSW will reach out to [employer] Representative to follow-up. Informed [name] of option of RN services through [facility] and PT/OT through [facility]. [name] reported they would be agreeable to this. Informed her per Georgiana Shore, it is a 7-10 day process to get this approved. [name] was agreeable with starting this process even though patient did have a decline with his breathing yesterday per rounds, so that we can have services arranged when patient is ready for discharge.  CSW called [employer] with update. Orpah Clinton said he will begin process of getting patient approved for nursing services at home. CSW inquired how long approval lasts. Orpah Clinton reported it lasts for 6 months and that if patient is not ready for discharge once approval is obtained, they could just adjust the start of care date as needed based on when patient is ready.   CSW called [employer] Representative Olegario Messier to request follow up on status of scheduling overnight study. Olegario Messier is following up with their RT team about this.  2:20: Return call from [employer] who reported they have been trying to reach patient but have been unable. Informed Olegario Messier patient has been in hospital 21 days. Provided contact information for roommate [name]. Olegario Messier will have [employer] RT reach out  to [place] about what is needed.   Also received a call from [employer]. Provided patient's medicaid number.  CSW will continue to follow.    Expected Discharge Plan: Home w Home Health Services Barriers to Discharge: Continued Medical Work up  Expected Discharge Plan and Services Expected Discharge Plan: Home w Home Health Services     Post Acute Care Choice: Home Health Living arrangements for the past 2 months: Single Family Home                             HH Agency: [facility]     Representative spoke with at [facility] Agency: Feliberto Gottron   Social Determinants of Health (SDOH) Interventions    Readmission Risk Interventions Readmission Risk Prevention Plan 06/29/2019  Transportation Screening Complete  PCP or Specialist Appt within 3-5 Days Complete  HRI or Home Care Consult Complete  Palliative Care Screening Complete  Medication Review (RN Care Manager) Complete  Some recent data might be hidden

## 2019-07-06 NOTE — Progress Notes (Signed)
PHARMACY CONSULT NOTE  Pharmacy Consult for Electrolyte Monitoring and Replacement   Recent Labs: Potassium (mmol/L)  Date Value  07/06/2019 4.4  09/24/2013 4.6   Magnesium (mg/dL)  Date Value  89/38/1017 1.8   Calcium (mg/dL)  Date Value  51/04/5850 8.5 (L)   Calcium, Total (mg/dL)  Date Value  77/82/4235 8.9   Albumin (g/dL)  Date Value  36/14/4315 3.5   Phosphorus (mg/dL)  Date Value  40/10/6759 3.0   Sodium (mmol/L)  Date Value  07/06/2019 135  09/24/2013 138     Assessment: 49 year old male admitted after being found unresponsive and admitted with end stage COPD. Patient was intubated in the ED and transferred to the ICU. Patient started on tube feeds and at refeeding risk.   Patient extubated 4/15 am and placed on BiPAP. Patient required reintubation. Currently post-tracheostomy.  Trach 4/20, G-tube placement 4/22.  Electrolytes: Will replace to maintain K ~4 and mag ~2.  Will give magnesium 2 g IV x 1 dose.  Electrolytes with AM labs.  Glucose: Currently with consistently low CBG. On D10 at 50 mL/hr with NS to avoid hyponatremia. Hydrocortisone 50mg  IV Q6hr continued. Patient is receiving Osmolite 1.5 VT at 60 mL/hr, as well as Juven 1 packet VT BID between meals.  Will continue to monitor.  Constipation: LBM 5/1 and currently receiving senna-docusate 2 tab VT BID and miralax daily.  Will continue to monitor.   Pharmacy will continue to monitor and adjust per consult.   , RPh 07/06/2019 3:33 PM

## 2019-07-06 NOTE — Progress Notes (Signed)
CRITICAL CARE NOTE  49 year old former smoker with a history very severe COPD stage IV, who presented via EMS after being found down and unresponsive.  Apparently the patient's mother placed a call to EMS.  The patient has a longstanding history of COPD and noncompliance with his oxygen and CPAP.  Multiple admission for COPD  Of note he had recently been admitted to [facility] 29 May 2019 through 03 June 2019.  He had been evaluated by palliative care during this admission and actually had a DNR order.   Patient also had a 2D echo  with pulmonary hypertension consistent with cor pulmonale.  Records review: Patient has been following with Dr. Wallene Huh at [facility].  Last known PFTs 11 September 2017.  FEV1 0.52 L or 20% predicted, FVC 1.49 L or 45% of predicted, FEV1/FVC 35%.  FEF 25-75 6%.  DLCO was 33%.  This is consistent with very severe COPD.  Events: 4/13 admitted for severe resp failure from COPD 4/14 remains critically ill on vent 4/15 remains critically ill, severe COPD, failed SAT/SBT 4/16 extubated yesterday, had to be reintubated remains on ventilator 4/17 sedated on the ventilator 4/18 abdominal distention, CT abdomen and pelvis consistent with ileus and constipation 4/19-patietn remains on ventialtor awainting trach 4/20 - patient is on vent with trach, he had episodes of aggitation today but eventually improved  4/21 - patient with significant distension of abdomen, concern for aerophagia in NIV.  There is mucopurulent exudation from trache this was cultures and patient placed on Unasyn for tracheitis 06/24/19 -trache site improved with less malodorous pus draining, NGT with decompression worked well abdomen is less distended. S/p PEG 06/25/19- patient had some anxiety throughout day today, we have started low dose anxiolytic. His PEG is now being utilized. He has some placement issues with social work due to insurance and we are working on this now.  06/26/19-Patient had no events  today, we are waiting for placement now from transitional care team 06/27/19- Patient is calm this morining, abdomen is mildy distended, awaiting placement to [facility] 4/26 plan for PS/TCT 4/27 Pt failed trach collar twice today. Became visibly distressed, diaphoretic, o2 drops, and accessory muscle use.  4/28 increased WOB, resp distress, using accessory muscles to breath while on vent 4/29 patient placed on PS mode, given BD therapy, alert and awake, was able to communicate with PM valve 07/02/19 -trache changed to cuffed size 6 without incident, patient reports more comfort with improved vocalization.  07/03/19- no overnight events, patient sitting up in bed in no distress.  5/3 severe hypoxia and increased WOB, placed back on full vent support 5/4 severe resp distress, can NOT tolerate PS mode, severe resp failure End stage COPD   CC  Severe end-stage COPD Follow-up respiratory failure  HPI Creased work of breathing increased shortness of breath Using accessory muscles to breathe  UNAble to tolerate pressure support mode  + Air trapping  + Increased work of breathing and elevated PCO2 iIncreased WOB    BP 114/82 (BP Location: Left Arm)   Pulse 86   Temp 98.1 F (36.7 C) (Oral)   Resp 16   Ht 5\' 6"  (1.676 m)   Wt 57.5 kg   SpO2 99%   BMI 20.46 kg/m    I/O last 3 completed shifts: In: 3278.7 [I.V.:1734.7; NG/GT:1544] Out: 7062 [Urine:2300] No intake/output data recorded.  SpO2: 99 % O2 Flow Rate (L/min): 2 L/min FiO2 (%): 28 %  Estimated body mass index is 20.46 kg/m as calculated  from the following:   Height as of this encounter: 5\' 6"  (1.676 m).   Weight as of this encounter: 57.5 kg.   REVIEW OF SYSTEMS  PATIENT IS UNABLE TO PROVIDE COMPLETE REVIEW OF SYSTEM S DUE TO SEVERE CRITICAL ILLNESS AND severe respiratory failure   PHYSICAL EXAMINATION: Physical Exam  HENT:  Head: Normocephalic and atraumatic.    Cardiovascular: Regular rhythm.  Pulmonary/Chest:  Accessory muscle usage present. Tachypnea noted. He is in respiratory distress. He has wheezes.  Abdominal: Bowel sounds are normal. He exhibits distension. He exhibits no fluid wave and no ascites. There is no abdominal tenderness.  Genitourinary:    Rectum normal.   Neurological:  Severe anxiety and increased work of breathing       MEDICATIONS: I have reviewed all medications and confirmed regimen as documented   CULTURE RESULTS   No results found for this or any previous visit (from the past 240 hour(s)).      BMP Latest Ref Rng & Units 07/06/2019 07/05/2019 07/04/2019  Glucose 70 - 99 mg/dL 09/03/2019) 91 924(M)  BUN 6 - 20 mg/dL 17 15 12   Creatinine 0.61 - 1.24 mg/dL 628(M) ) 3.81(R)  Sodium 135 - 145 mmol/L 135 138 139  Potassium 3.5 - 5.1 mmol/L 4.4 4.3 4.0  Chloride 98 - 111 mmol/L 90(L) 93(L) 95(L)  CO2 22 - 32 mmol/L 35(H) 39(H) 35(H)  Calcium 8.9 - 10.3 mg/dL 7.11(A) 5.79(U) 8.3(L)         Indwelling Urinary Catheter continued, requirement due to   Reason to continue Indwelling Urinary Catheter strict Intake/Output monitoring for hemodynamic instability   Central Line/ continued, requirement due to  Reason to continue 3.8(B Monitoring of central venous pressure or other hemodynamic parameters and poor IV access   Ventilator continued, requirement due to severe respiratory failure       ASSESSMENT AND PLAN  Severe acute hypoxic and hypercapnic respiratory failure due to end-stage COPD with progression to ventilator dependent and respiratory insufficiency, Continue mechanical ventilator support Continue bronchodilator therapy Wean FiO2 as tolerated  VAP/VENT bundle implantation  SEVERE COPD EXACERBATION -continue IV steroids as prescribed -continue NEB THERAPY as prescribed -morphine as needed -wean fio2 as needed and tolerated   GI GI PROPHYLAXIS as indicated  NUTRITIONAL STATUS DIET-->TF's as tolerated Constipation protocol as  indicated   ELECTROLYTES -follow labs as needed -replace as needed -pharmacy consultation and following    DVT/GI PRX ordered TRANSFUSIONS AS NEEDED MONITOR FSBS ASSESS the need for LABS as needed    Critical Care Time devoted to patient care services described in this note is 32 minutes.   Overall, patient is critically ill, prognosis is guarded.  Patient with Multiorgan failure and at high risk for cardiac arrest and death.    3.3(O, M.D.  [employer] [facility]  Medical Director [facility] Medical Director [facility] Cardio-Pulmonary Department

## 2019-07-06 NOTE — Progress Notes (Signed)
PT Cancellation Note  Patient Details Name: Chris Case MRN: [id number] DOB: [birth year]   Cancelled Treatment:    Reason Eval/Treat Not Completed: Other (comment). Per chart review, pt with severe hypoxia over night with increased WOB. Placed back on full vent support. Not appropriate for exertional activity at this time. Will continue to monitor next available date.   Malee Grays 07/06/2019, 3:15 PM Elizabeth Palau, PT, DPT [phone]

## 2019-07-06 NOTE — Consult Note (Addendum)
WOC Nurse Consult Note: Reason for Consult: Consult requested for buttocks Wound type: Stage 3 pressure injury to right buttock, 3X2X.2cm, red and moist, small amt yellow drainage, no odor or fluctuance Pressure Injury POA: No Dressing procedure/placement/frequency: Pt is critically ill with multiple systemic factors which can impair healing.  He is on an air mattress to reduce pressure.  Topical treatment orders provided for bedside nurses to perform daily as follows to protect and promote healing: Foam dressing to buttock, change Q 3 days or PRN soiling. WOC team will reassess location weekly to determine if a change in the plan of care is indicated at that time.  Cammie Mcgee MSN, RN, CWOCN, [place], CNS [phone]

## 2019-07-06 NOTE — Progress Notes (Signed)
Attempted to place patient on 35% aerosol trach collar for weaning trial, but patient became very tachypneic and sats dropped to 81%. Due to WOB and decreasing saturations, patient was placed back on ventilator. PSV was attempted but patient still was intolerant to this as well. Patient subsequently placed back on PCV at previous settings. Patient slowly began to calm and saturations rose back into the 90's.

## 2019-07-06 NOTE — TOC Progression Note (Addendum)
Transition of Care [facility]) - Progression Note    Patient Details  Name: Chris Case MRN: [id number] Date of Birth: Dec 09, 1970  Transition of Care [facility]) CM/SW Contact  Liliana Cline, LCSW Phone Number: 07/06/2019, 3:40 PM  Clinical Narrative:   Return call from [employer]. Olegario Messier reported their RT said that the RT at [facility] will have to teach patient;s roommate [name]) to use the trach, then once that is completed [employer] RT will come to the hospital and teach [name] to use the trach with patient's Trilogy machine. Olegario Messier reported patient would still use the Trilogy machine, it would just switch settings to an invasive vent (currently on non invasive setting). CSW asked if this would have to be overnight training as CSW was previously informed by Geneticist, molecular. Olegario Messier reported she will have to check on this. Also reminded Olegario Messier that patient will need trach and feeding supplies through [employer]. Olegario Messier will follow up for more details about training and if it needs to be overnight. CSW will continue to follow.   4:00- Call from [employer] Representative Olegario Messier who clarified [name] (roommate) would have to stay overnight, but RT staff would not be staying overnight.     Expected Discharge Plan: Home w Home Health Services Barriers to Discharge: Continued Medical Work up  Expected Discharge Plan and Services Expected Discharge Plan: Home w Home Health Services     Post Acute Care Choice: Home Health Living arrangements for the past 2 months: Single Family Home                             HH Agency: [facility]     Representative spoke with at [facility] Agency: Feliberto Gottron   Social Determinants of Health (SDOH) Interventions    Readmission Risk Interventions Readmission Risk Prevention Plan 06/29/2019  Transportation Screening Complete  PCP or Specialist Appt within 3-5 Days Complete  HRI or Home Care Consult Complete  Palliative Care Screening Complete   Medication Review (RN Care Manager) Complete  Some recent data might be hidden

## 2019-07-07 DIAGNOSIS — J9601 Acute respiratory failure with hypoxia: Secondary | ICD-10-CM | POA: Diagnosis not present

## 2019-07-07 DIAGNOSIS — J441 Chronic obstructive pulmonary disease with (acute) exacerbation: Secondary | ICD-10-CM | POA: Diagnosis not present

## 2019-07-07 LAB — CBC WITH DIFFERENTIAL/PLATELET
Abs Immature Granulocytes: 0.11 10*3/uL — ABNORMAL HIGH (ref 0.00–0.07)
Basophils Absolute: 0 10*3/uL (ref 0.0–0.1)
Basophils Relative: 0 %
Eosinophils Absolute: 0.1 10*3/uL (ref 0.0–0.5)
Eosinophils Relative: 1 %
HCT: 27.8 % — ABNORMAL LOW (ref 39.0–52.0)
Hemoglobin: 8.8 g/dL — ABNORMAL LOW (ref 13.0–17.0)
Immature Granulocytes: 1 %
Lymphocytes Relative: 13 %
Lymphs Abs: 1.5 10*3/uL (ref 0.7–4.0)
MCH: 30.9 pg (ref 26.0–34.0)
MCHC: 31.7 g/dL (ref 30.0–36.0)
MCV: 97.5 fL (ref 80.0–100.0)
Monocytes Absolute: 1 10*3/uL (ref 0.1–1.0)
Monocytes Relative: 9 %
Neutro Abs: 8.6 10*3/uL — ABNORMAL HIGH (ref 1.7–7.7)
Neutrophils Relative %: 76 %
Platelets: 169 10*3/uL (ref 150–400)
RBC: 2.85 MIL/uL — ABNORMAL LOW (ref 4.22–5.81)
RDW: 13 % (ref 11.5–15.5)
WBC: 11.3 10*3/uL — ABNORMAL HIGH (ref 4.0–10.5)
nRBC: 0 % (ref 0.0–0.2)

## 2019-07-07 LAB — GLUCOSE, CAPILLARY
Glucose-Capillary: 100 mg/dL — ABNORMAL HIGH (ref 70–99)
Glucose-Capillary: 110 mg/dL — ABNORMAL HIGH (ref 70–99)
Glucose-Capillary: 114 mg/dL — ABNORMAL HIGH (ref 70–99)
Glucose-Capillary: 114 mg/dL — ABNORMAL HIGH (ref 70–99)
Glucose-Capillary: 118 mg/dL — ABNORMAL HIGH (ref 70–99)
Glucose-Capillary: 132 mg/dL — ABNORMAL HIGH (ref 70–99)
Glucose-Capillary: 146 mg/dL — ABNORMAL HIGH (ref 70–99)

## 2019-07-07 LAB — BASIC METABOLIC PANEL
Anion gap: 9 (ref 5–15)
BUN: 15 mg/dL (ref 6–20)
CO2: 37 mmol/L — ABNORMAL HIGH (ref 22–32)
Calcium: 8.8 mg/dL — ABNORMAL LOW (ref 8.9–10.3)
Chloride: 93 mmol/L — ABNORMAL LOW (ref 98–111)
Creatinine, Ser: 0.39 mg/dL — ABNORMAL LOW (ref 0.61–1.24)
GFR calc Af Amer: >60 mL/min (ref >60)
GFR calc non Af Amer: >60 mL/min (ref >60)
Glucose, Bld: 96 mg/dL (ref 70–99)
Potassium: 3.6 mmol/L (ref 3.5–5.1)
Sodium: 139 mmol/L (ref 135–145)

## 2019-07-07 LAB — MAGNESIUM: Magnesium: 2 mg/dL (ref 1.7–2.4)

## 2019-07-07 LAB — PHOSPHORUS: Phosphorus: 3.2 mg/dL (ref 2.5–4.6)

## 2019-07-07 MED ORDER — DEXTROSE 10 % IV SOLN: INTRAVENOUS | Status: DC

## 2019-07-07 MED ORDER — OSMOLITE 1.5 CAL PO LIQD
1000.0000 mL | ORAL | Status: DC
  Administered 2019-07-07 – 2019-07-10 (×5): 1000 mL

## 2019-07-07 MED ORDER — POLYETHYLENE GLYCOL 3350 17 G PO PACK
17.0000 g | PACK | Freq: Every day | ORAL | Status: DC
  Administered 2019-07-07 – 2019-07-08 (×2): 17 g
  Filled 2019-07-07 (×2): qty 1

## 2019-07-07 MED ORDER — POTASSIUM CHLORIDE 20 MEQ PO PACK
40.0000 meq | PACK | Freq: Once | ORAL | Status: AC
  Administered 2019-07-07: 40 meq
  Filled 2019-07-07: qty 2

## 2019-07-07 MED ORDER — ALPRAZOLAM 0.25 MG PO TABS
0.2500 mg | ORAL_TABLET | Freq: Three times a day (TID) | ORAL | Status: DC
  Administered 2019-07-07 – 2019-07-23 (×43): 0.25 mg
  Filled 2019-07-07 (×43): qty 1

## 2019-07-07 NOTE — Progress Notes (Signed)
Nutrition Follow-up  DOCUMENTATION CODES:   Severe malnutrition in context of chronic illness  INTERVENTION:  Increase to new goal TF regimen of Osmolite 1.5 Cal at 75 mL/hr (1800 mL goal daily volume). Provides 2700 kcal, 113 grams of protein, 366.5 grams of carbohydrates, 1368 mL H2O daily.  This is an increase of 73 grams of carbohydrates from previous regimen of Osmolite 1.5 Cal at 60 mL/hr, which is the same amount of dextrose he is currently getting from D10 at 30 mL/hr.  Goal TF regimen meets 100% RDIs for vitamins/minerals.  Continue Provide Juven 1 packet per tube BID. Each packet provides 95 kcal, 7 grams L-Arginine, 7 grams L-Glutamine, 2.5 grams collagen protein, 300 mg vitamin C, 9.5 mg zinc, and other micronutrients essential for wound healing.  Continue free water flush of 30 mL Q4hrs to maintain tube patency while patient is on IV fluids.  Once plan is to meet all hydration needs enterally per free water flush recommend a free water flush of 120 mL Q6hrs. Provides a total of 1574 mL H2O daily including water in tube feeding regimen.  NUTRITION DIAGNOSIS:   Severe Malnutrition related to chronic illness(COPD) as evidenced by severe fat depletion, severe muscle depletion.  Ongoing - addressing with TF regimen.  GOAL:   Patient will meet greater than or equal to 90% of their needs  Met with TF regimen.  MONITOR:   Labs, Weight trends, Vent status, TF tolerance, I & O's  REASON FOR ASSESSMENT:   Ventilator    ASSESSMENT:   49 year old male with PMHx of emphysema, COPD, hx TB admitted with acute COPD exacerbation requiring intubation.  -Patient s/p tracheostomy tube placement on 4/20. -Patient s/p robotic assisted laparoscopic G-tube placement with extensive lysis of adhesions on 4/22.  Met with patient at bedside. He continues to tolerate tube feed regimen. Discussed on rounds. Plan is to increase TF regimen so patient can hopefully come off D10. Noted  wound has progressed from stage 2 to stage 3.  Patient is currently intubated on ventilator support MV: 8.5 L/min Temp (24hrs), Avg:98.1 F (36.7 C), Min:97.9 F (36.6 C), Max:98.4 F (36.9 C)  Medications reviewed and include: famotidine, Solu-Cortef 50 mg Q6hrs IV, Miralax, potassium chloride 40 mEq per tube once today, senna-docusate, D10 at 30 mL/hr (72 grams dextrose daily).  Labs reviewed: CBG 114-138, Chloride 93, CO2 37, Creatinine 0.39.  I/O: 850 mL UOP yesterday (0.6 mL/kg/hr)  Enteral Access: G-tube  Weight trend: 57.5 kg on 4/29; + 2.3 kg from 4/13  Diet Order:   Diet Order            Diet NPO time specified  Diet effective midnight             EDUCATION NEEDS:   No education needs have been identified at this time  Skin:  Skin Assessment: Skin Integrity Issues:(stage 2 buttocks)  Last BM:  07/03/2019  Height:   Ht Readings from Last 1 Encounters:  06/15/19 _0  (1.676 m)   Weight:   Wt Readings from Last 1 Encounters:  07/01/19 57.5 kg   Ideal Body Weight:  64.5 kg  BMI:  Body mass index is 20.46 kg/m.  Estimated Nutritional Needs:   Kcal:  1900-2100  Protein:  85-100 grams  Fluid:  1.6-1.9 L/day  Jacklynn Barnacle, MS, RD, LDN Pager number available on Amion

## 2019-07-07 NOTE — Progress Notes (Signed)
OT Cancellation Note  Patient Details Name: Chris Case MRN: [id number] DOB: 1970/12/03   Cancelled Treatment:    Reason Eval/Treat Not Completed: Medical issues which prohibited therapy  RN and PT report that pt with difficulty regulating breathing following therapy session earlier this afternoon. Pt required going back on vent as well as morphine per RN, to regulate breathing. Will f/u at later date/time as able for OT treatment. Thank you.  Rejeana Brock, MS, OTR/L ascom [phone] 07/07/19, 3:00 PM

## 2019-07-07 NOTE — Progress Notes (Signed)
CH visited pt. as follow-up from prior visits; pt. sitting up in bed w/vent; roommate [name] in chair @ bedside.  CH had extended visit w/pt. and rmmate.  Learned that they remained isolated for most of the last year due to pt.'s high-risk for COVID complications.  [name] helps local rescue shelter w/cats; [name] suffers w/intermitternt multiple sclerosis and was scheduled for back surgery but had to postpone due to COVID --> unable to walk very far.  Pt. 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  Discussed pt.'s stubbornness as both an obstacle to his health in not following doctors' orders and also a major factor in his drive to get discharged home and not to facility.  CH prayed for pt. and [name] before end of visit; will continue to monitor pt. needs.    07/07/19 1650  Clinical Encounter Type  Visited With Patient and family together  Visit Type Follow-up;Psychological support;Social support;Spiritual support;Critical Care  Referral From Other (Comment) (Routine Rounding )  Consult/Referral To Chaplain  Spiritual Encounters  Spiritual Needs Emotional;Prayer  Stress Factors  Patient Stress Factors Lack of knowledge;Health changes;Major life changes  Family Stress Factors Major life changes

## 2019-07-07 NOTE — Progress Notes (Signed)
CH visited pt. while rounding on ICU; pt. sitting up in bed, on trach ventilator; somewhat difficult to understand pt.'s speech per trach, but pt. expressed sense of frustration w/himself for getting anxious when medical team attempts to wean him off vent (as CH understood pt.); pt. still appears to be coping effectively --hopes he will be in hospital for 'one more week' and then be able to be discharged home.  CH received page during visit and had to leave but offered prayer for pt. before end of visit.  CH will continue to monitor pt.'s needs.    07/06/19 1530  Clinical Encounter Type  Visited With Patient  Visit Type Follow-up;Psychological support;Spiritual support;Social support;Critical Care  Referral From Other (Comment) (Routine Rounding)  Spiritual Encounters  Spiritual Needs Emotional;Prayer  Stress Factors  Patient Stress Factors Loss of control;Major life changes;Health changes

## 2019-07-07 NOTE — Progress Notes (Signed)
CRITICAL CARE NOTE  49 year old former smoker with a history very severe COPD stage IV, who presented via EMS after being found down and unresponsive.  Apparently the patient's mother placed a call to EMS.  The patient has a longstanding history of COPD and noncompliance with his oxygen and CPAP.  Multiple admission for COPD  Of note he had recently been admitted to [facility] 29 May 2019 through 03 June 2019.  He had been evaluated by palliative care during this admission and actually had a DNR order.   Patient also had a 2D echo  with pulmonary hypertension consistent with cor pulmonale.  Records review: Patient has been following with Dr. Wallene Huh at [facility].  Last known PFTs 11 September 2017.  FEV1 0.52 L or 20% predicted, FVC 1.49 L or 45% of predicted, FEV1/FVC 35%.  FEF 25-75 6%.  DLCO was 33%.  This is consistent with very severe COPD.  Events: 4/13 admitted for severe resp failure from COPD 4/14 remains critically ill on vent 4/15 remains critically ill, severe COPD, failed SAT/SBT 4/16 extubated yesterday, had to be reintubated remains on ventilator 4/17 sedated on the ventilator 4/18 abdominal distention, CT abdomen and pelvis consistent with ileus and constipation 4/19-patietn remains on ventialtor awainting trach 4/20 - patient is on vent with trach, he had episodes of aggitation today but eventually improved  4/21 - patient with significant distension of abdomen, concern for aerophagia in NIV.  There is mucopurulent exudation from trache this was cultures and patient placed on Unasyn for tracheitis 06/24/19 -trache site improved with less malodorous pus draining, NGT with decompression worked well abdomen is less distended. S/p PEG 06/25/19- patient had some anxiety throughout day today, we have started low dose anxiolytic. His PEG is now being utilized. He has some placement issues with social work due to insurance and we are working on this now.  06/26/19-Patient had no events  today, we are waiting for placement now from transitional care team 06/27/19- Patient is calm this morining, abdomen is mildy distended, awaiting placement to [facility] 4/26 plan for PS/TCT 4/27 Pt failed trach collar twice today. Became visibly distressed, diaphoretic, o2 drops, and accessory muscle use.  4/28 increased WOB, resp distress, using accessory muscles to breath while on vent 4/29 patient placed on PS mode, given BD therapy, alert and awake, was able to communicate with PM valve 07/02/19 -trache changed to cuffed size 6 without incident, patient reports more comfort with improved vocalization.  07/03/19- no overnight events, patient sitting up in bed in no distress.  5/3 severe hypoxia and increased WOB, placed back on full vent support 5/4 severe resp distress, can NOT tolerate PS mode, severe resp failure End stage COPD 5/5 severe resp distress, can not tolerate TCT, severe end stage COPD   CC  Severe end stage COPD Severe resp failure   HPI Increased WOB and increased SOB Using accessory muscles to breathe +air trapping     BP (!) 86/60   Pulse 86   Temp 98.1 F (36.7 C) (Oral)   Resp 16   Ht 5\' 6"  (1.676 m)   Wt 57.5 kg   SpO2 95%   BMI 20.46 kg/m    I/O last 3 completed shifts: In: 1052.4 [I.V.:1052.4] Out: 1750 [Urine:1750] No intake/output data recorded.  SpO2: 95 % O2 Flow Rate (L/min): 2 L/min FiO2 (%): 28 %  Estimated body mass index is 20.46 kg/m as calculated from the following:   Height as of this encounter: 5\' 6"  (  1.676 m).   Weight as of this encounter: 57.5 kg.  REVIEW OF SYSTEMS  PATIENT IS UNABLE TO PROVIDE COMPLETE REVIEW OF SYSTEM S DUE TO SEVERE RESP FAILURE  PHYSICAL EXAMINATION: Physical Exam  HENT:  Head: Normocephalic and atraumatic.    Cardiovascular: Regular rhythm.  Pulmonary/Chest: Accessory muscle usage present. Tachypnea noted. He is in respiratory distress. He has wheezes.  Abdominal: Bowel sounds are normal. He  exhibits distension. He exhibits no fluid wave and no ascites. There is no abdominal tenderness.  Genitourinary:    Rectum normal.   Neurological:  Severe anxiety and increased work of breathing       MEDICATIONS: I have reviewed all medications and confirmed regimen as documented   CULTURE RESULTS   No results found for this or any previous visit (from the past 240 hour(s)).      BMP Latest Ref Rng & Units 07/07/2019 07/06/2019 07/05/2019  Glucose 70 - 99 mg/dL 96 630(Z) 91  BUN 6 - 20 mg/dL 15 17 15   Creatinine 0.61 - 1.24 mg/dL ) 6.01(U) 9.32(T)  Sodium 135 - 145 mmol/L 139 135 138  Potassium 3.5 - 5.1 mmol/L 3.6 4.4 4.3  Chloride 98 - 111 mmol/L 93(L) 90(L) 93(L)  CO2 22 - 32 mmol/L 37(H) 35(H) 39(H)  Calcium 8.9 - 10.3 mg/dL 5.57(D) 2.2(G) 2.5(K)      Indwelling Urinary Catheter continued, requirement due to   Reason to continue Indwelling Urinary Catheter strict Intake/Output monitoring for hemodynamic instability   Central Line/ continued, requirement due to  Reason to continue 2.7(C of central venous pressure or other hemodynamic parameters and poor IV access   Ventilator continued, requirement due to severe respiratory failure         ASSESSMENT AND PLAN  Severe ACUTE Hypoxic and Hypercapnic Respiratory Failure due to end stage COPD with Vent dependant and resp insufficiency Continue mechanical ventilator support VAP/VENT bundle implantation   SEVERE COPD EXACERBATION -continue IV steroids as prescribed -continue NEB THERAPY as prescribed -morphine as needed -wean fio2 as needed and tolerated    GI GI PROPHYLAXIS as indicated  NUTRITIONAL STATUS DIET-->TF's as tolerated Constipation protocol as indicated   ELECTROLYTES -follow labs as needed -replace as needed -pharmacy consultation and following    DVT/GI PRX ordered TRANSFUSIONS AS NEEDED MONITOR FSBS ASSESS the need for LABS as needed   Severe  Hypoglycemia ?etiology Continue steroids increase caloric intake    Critical Care Time devoted to patient care services described in this note is 31 minutes.   Overall, patient is critically ill, prognosis is guarded.   Patient is DNR   [employer], M.D.  [facility]  Medical Director [facility] Medical Director [facility] Cardio-Pulmonary Department

## 2019-07-07 NOTE — Progress Notes (Signed)
Physical Therapy Treatment Patient Details Name: Chris Case MRN: [id number] DOB: [birth year] Today's Date: 07/07/2019    History of Present Illness Chris Case is a 2yoM who comes to [facility] on 4/13 after being found unresponsive. Pt here 29 May 2019 through 03 June 2019. Pt required ventilation upon arrival, failed extubation on 4/16, transitioned to tach on 4/20. Pt has required NGT decompression d/t ABD distension, underwent  PEG placement and lysis adhesions 4/22. 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    PT Comments    Pt resting in bed upon PT arrival.  Nurse reports pt had respiratory issues after last therapy session.  Performed x10 reps B LE ankle pumps, rest break, x10 quad sets, rest break, x10 L LE heel-slides, rest break, and x10 R LE heel-slides, rest break.  Therapist paced pt with rest breaks and monitored pt's work of breathing during session.  Educated pt on pacing and activity modification d/t impaired activity tolerance and respiratory concerns: requires review.  Increased WOB noted with minimal activity so session's activities limited: pt then requesting to be put back on vent (nurse came to switch pt to vent).  Will need to monitor pt's status and progress pt per pt tolerance.     Follow Up Recommendations  LTACH;Supervision for mobility/OOB;Supervision/Assistance - 24 hour     Equipment Recommendations  Wheelchair (measurements PT);Wheelchair cushion (measurements PT);Hospital bed;3in1 (PT)    Recommendations for Other Services       Precautions / Restrictions Precautions Precautions: Fall Precaution Comments: monitor O2, HR, and BP; trach collar vs mechanical ventilation; HOB >30 degrees; NPO; PEG tube; R PICC line Restrictions Weight Bearing Restrictions: No    Mobility  Bed Mobility                  Transfers                    Ambulation/Gait                 Stairs              Wheelchair Mobility    Modified Rankin (Stroke Patients Only)       Balance                                            Cognition Arousal/Alertness: Awake/alert Behavior During Therapy: WFL for tasks assessed/performed Overall Cognitive Status: Within Functional Limits for tasks assessed                                 General Comments: Therapist able to read pt's lips when he mouths words.      Exercises General Exercises - Lower Extremity Ankle Circles/Pumps: AROM;Strengthening;Both;10 reps;Supine Quad Sets: AROM;Strengthening;Both;10 reps;Supine Heel Slides: AROM;Strengthening;Both;10 reps;Supine    General Comments   Nursing cleared pt for participation in physical therapy.  Pt agreeable to PT session.      Pertinent Vitals/Pain Pain Assessment: 0-10 Pain Score: 8  Pain Location: abdominal pain Pain Descriptors / Indicators: Sore;Tender Pain Intervention(s): Limited activity within patient's tolerance;Monitored during session;Repositioned;Other (comment)(pt reports plan to ask for more pain medication between 3-4 pm and that nurse was already aware)    Home Living  Prior Function            PT Goals (current goals can now be found in the care plan section) Acute Rehab PT Goals Patient Stated Goal: to go home PT Goal Formulation: With patient Time For Goal Achievement: 07/21/19 Potential to Achieve Goals: Fair Progress towards PT goals: Progressing toward goals    Frequency    Min 2X/week      PT Plan Current plan remains appropriate    Co-evaluation              AM-PAC PT "6 Clicks" Mobility   Outcome Measure  Help needed turning from your back to your side while in a flat bed without using bedrails?: A Little Help needed moving from lying on your back to sitting on the side of a flat bed without using bedrails?: A Little Help needed moving to and from a bed to a chair  (including a wheelchair)?: A Little Help needed standing up from a chair using your arms (e.g., wheelchair or bedside chair)?: A Lot Help needed to walk in hospital room?: Total Help needed climbing 3-5 steps with a railing? : Total 6 Click Score: 13    End of Session Equipment Utilized During Treatment: Oxygen(Trach collar) Activity Tolerance: Patient limited by fatigue Patient left: in bed;with call bell/phone within reach Nurse Communication: Mobility status;Precautions PT Visit Diagnosis: Unsteadiness on feet (R26.81);Muscle weakness (generalized) (M62.81);History of falling (Z91.81);Other abnormalities of gait and mobility (R26.89)     Time: 8850-2774 PT Time Calculation (min) (ACUTE ONLY): 20 min  Charges:  $Therapeutic Exercise: 8-22 mins                    Hendricks Limes, PT 07/07/19, 3:36 PM

## 2019-07-07 NOTE — Progress Notes (Signed)
PHARMACY CONSULT NOTE  Pharmacy Consult for Electrolyte Monitoring and Replacement   Recent Labs: Potassium (mmol/L)  Date Value  07/07/2019 3.6  09/24/2013 4.6   Magnesium (mg/dL)  Date Value  89/21/1941 2.0   Calcium (mg/dL)  Date Value  74/10/1446 8.8 (L)   Calcium, Total (mg/dL)  Date Value  18/56/3149 8.9   Albumin (g/dL)  Date Value  70/26/3785 3.5   Phosphorus (mg/dL)  Date Value  88/50/2774 3.2   Sodium (mmol/L)  Date Value  07/07/2019 139  09/24/2013 138     Assessment: 49 year old male admitted after being found unresponsive and admitted with end stage COPD. Patient was intubated in the ED and transferred to the ICU. Patient started on tube feeds and at refeeding risk.   Patient extubated 4/15 am and placed on BiPAP. Patient required reintubation. Currently post-tracheostomy.  Trach 4/20, G-tube placement 4/22.  Electrolytes: Will replace to maintain K ~4 and mag ~2.  Will give Potassium VT x 1.  Electrolytes with AM labs.  Glucose: Tube feeds changed to Osmolite 1.5 VT at 75 mL/hr. Will decrease D10 to 110mL/hr. Follow sodium and glucose.  Hydrocortisone 50mg  IV Q6hr continued.  Constipation: LBM 5/4 and currently receiving senna-docusate 2 tab VT BID and miralax daily.  Will continue to monitor.   Pharmacy will continue to monitor and adjust per consult.   Annalee Meyerhoff L, RPh 07/07/2019 4:21 PM

## 2019-07-07 NOTE — TOC Progression Note (Addendum)
Transition of Care [facility]) - Progression Note    Patient Details  Name: Chris Case MRN: [id number] Date of Birth: 1970-08-09  Transition [facility]) CM/SW Contact  Liliana Cline, LCSW Phone Number: 07/07/2019, 12:02 PM  Clinical Narrative:    CSW informed RT Bambi about trach training needs for roommate/caregiver. Bambi reported patient's roommate can come in and meet with an RT, does not need to schedule a specific time. CSW called patient's roommate, [name], to inform her of this. Left a voicemail requesting a return call.   CSW received a call from [facility] who reported they are working on getting patient approved for services and are doing a home visit with roommate this Friday.  1:35- CSW received a return call from roommate, [name]. Explained that per [employer], [name] will have to do trach training with our RT then next step would be more training at the hospital with [employer] RT where [name] would have to stay overnight. [name] verbalized understanding. [name] stated she will be in today and tomorrow 5/6 around 4 pm if RT is able to do trach training then. CSW updated RT Bambi.       Expected Discharge Plan: Home w Home Health Services Barriers to Discharge: Continued Medical Work up  Expected Discharge Plan and Services Expected Discharge Plan: Home w Home Health Services     Post Acute Care Choice: Home Health Living arrangements for the past 2 months: Single Family Home                             HH Agency: [facility]     Representative spoke with at [facility] Agency: Feliberto Gottron   Social Determinants of Health (SDOH) Interventions    Readmission Risk Interventions Readmission Risk Prevention Plan 06/29/2019  Transportation Screening Complete  PCP or Specialist Appt within 3-5 Days Complete  HRI or Home Care Consult Complete  Palliative Care Screening Complete  Medication Review (RN Care Manager) Complete  Some recent  data might be hidden

## 2019-07-08 DIAGNOSIS — J441 Chronic obstructive pulmonary disease with (acute) exacerbation: Secondary | ICD-10-CM | POA: Diagnosis not present

## 2019-07-08 LAB — GLUCOSE, CAPILLARY
Glucose-Capillary: 110 mg/dL — ABNORMAL HIGH (ref 70–99)
Glucose-Capillary: 114 mg/dL — ABNORMAL HIGH (ref 70–99)
Glucose-Capillary: 134 mg/dL — ABNORMAL HIGH (ref 70–99)
Glucose-Capillary: 139 mg/dL — ABNORMAL HIGH (ref 70–99)
Glucose-Capillary: 139 mg/dL — ABNORMAL HIGH (ref 70–99)

## 2019-07-08 LAB — CBC WITH DIFFERENTIAL/PLATELET
Abs Immature Granulocytes: 0.11 10*3/uL — ABNORMAL HIGH (ref 0.00–0.07)
Basophils Absolute: 0 10*3/uL (ref 0.0–0.1)
Basophils Relative: 0 %
Eosinophils Absolute: 0 10*3/uL (ref 0.0–0.5)
Eosinophils Relative: 0 %
HCT: 27.9 % — ABNORMAL LOW (ref 39.0–52.0)
Hemoglobin: 8.7 g/dL — ABNORMAL LOW (ref 13.0–17.0)
Immature Granulocytes: 1 %
Lymphocytes Relative: 8 %
Lymphs Abs: 0.9 10*3/uL (ref 0.7–4.0)
MCH: 31 pg (ref 26.0–34.0)
MCHC: 31.2 g/dL (ref 30.0–36.0)
MCV: 99.3 fL (ref 80.0–100.0)
Monocytes Absolute: 0.8 10*3/uL (ref 0.1–1.0)
Monocytes Relative: 7 %
Neutro Abs: 9.3 10*3/uL — ABNORMAL HIGH (ref 1.7–7.7)
Neutrophils Relative %: 84 %
Platelets: 163 10*3/uL (ref 150–400)
RBC: 2.81 MIL/uL — ABNORMAL LOW (ref 4.22–5.81)
RDW: 13.3 % (ref 11.5–15.5)
WBC: 11.1 10*3/uL — ABNORMAL HIGH (ref 4.0–10.5)
nRBC: 0 % (ref 0.0–0.2)

## 2019-07-08 LAB — BASIC METABOLIC PANEL
Anion gap: 5 (ref 5–15)
BUN: 19 mg/dL (ref 6–20)
CO2: 37 mmol/L — ABNORMAL HIGH (ref 22–32)
Calcium: 8.7 mg/dL — ABNORMAL LOW (ref 8.9–10.3)
Chloride: 92 mmol/L — ABNORMAL LOW (ref 98–111)
Creatinine, Ser: 0.39 mg/dL — ABNORMAL LOW (ref 0.61–1.24)
GFR calc Af Amer: >60 mL/min (ref >60)
GFR calc non Af Amer: >60 mL/min (ref >60)
Glucose, Bld: 126 mg/dL — ABNORMAL HIGH (ref 70–99)
Potassium: 3.8 mmol/L (ref 3.5–5.1)
Sodium: 134 mmol/L — ABNORMAL LOW (ref 135–145)

## 2019-07-08 LAB — MAGNESIUM: Magnesium: 1.7 mg/dL (ref 1.7–2.4)

## 2019-07-08 LAB — PHOSPHORUS: Phosphorus: 3.8 mg/dL (ref 2.5–4.6)

## 2019-07-08 MED ORDER — MAGNESIUM SULFATE 2 GM/50ML IV SOLN
2.0000 g | Freq: Once | INTRAVENOUS | Status: AC
  Administered 2019-07-08: 2 g via INTRAVENOUS
  Filled 2019-07-08: qty 50

## 2019-07-08 MED ORDER — POTASSIUM CHLORIDE 20 MEQ PO PACK
40.0000 meq | PACK | Freq: Once | ORAL | Status: AC
  Administered 2019-07-08: 40 meq
  Filled 2019-07-08: qty 2

## 2019-07-08 NOTE — Progress Notes (Signed)
Occupational Therapy Treatment Patient Details Name: Chris Case MRN: [id number] DOB: 1971-02-18 Today's Date: 07/08/2019    History of present illness Chris Case is a 48yoM who comes to [facility] on 4/13 after being found unresponsive. Pt here 29 May 2019 through 03 June 2019. Pt required ventilation upon arrival, failed extubation on 4/16, transitioned to tach on 4/20. Pt has required NGT decompression d/t ABD distension, underwent  PEG placement and lysis adhesions 4/22. 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   OT comments  Pt seen for OT treatment this date to f/u re: safety and tolerance for self care ADLs/ADL mobility. Pt with good tolerance for treatment overall. Performs sup to sit with SBA with HOB elevated, demos G static sitting balance. Tolerates EOB sitting x18 mins total while participating in UB ADLs with setup. Pt requires CGA to MIN A for sit to stand trials with RW x2., appears to tolerate well-see mobility section for details of VS and time of stands. Pt back to bed with HOB elevated, pillows replaced and call bell within reach. Pt with most notable increased WOB for repositioning in bed, and OT educates re: pacing and energy conservation. OT then provides MOD A for repositioning to assist pt in energy conservation. RN notified of pt performance. Overall, LTACH remains most prudent d/c recommendation at this time.    Follow Up Recommendations  LTACH;SNF    Equipment Recommendations  3 in 1 bedside commode    Recommendations for Other Services      Precautions / Restrictions Precautions Precautions: Fall Precaution Comments: monitor O2, HR, and BP; trach collar vs mechanical ventilation; HOB >30 degrees; NPO; PEG tube; R PICC line Restrictions Weight Bearing Restrictions: No       Mobility Bed Mobility Overal bed mobility: Needs Assistance Bed Mobility: Supine to Sit;Sit to Supine     Supine to sit:  Supervision;HOB elevated Sit to supine: Min guard;Min assist      Transfers Overall transfer level: Needs assistance Equipment used: Rolling walker (2 wheeled) Transfers: Sit to/from Stand Sit to Stand: Min guard;Min assist         General transfer comment: pt tolerates STS t/f x2 trials, static stands for ~30 sec on first trial, ~2 mins on second trial. Pt on Vent throughout, some increased RR (over vent settings of 16 breaths per minute) to 18-20 breaths per minute, but quickly resolves, spO2 >98% throughout.    Balance Overall balance assessment: Needs assistance Sitting-balance support: No upper extremity supported;Feet supported Sitting balance-Leahy Scale: Good     Standing balance support: Bilateral upper extremity supported Standing balance-Leahy Scale: Good Standing balance comment: G static standing, F dyanmic (unable to tolerate much dynamic reaching d/t relatively low tolerance).                           ADL either performed or assessed with clinical judgement   ADL Overall ADL's : Needs assistance/impaired     Grooming: Wash/dry face;Set up;Sitting       Lower Body Bathing: Moderate assistance;Maximal assistance;Sit to/from stand Lower Body Bathing Details (indicate cue type and reason): Pt able to tolerate static standing with B UEs on RW with CGA while OT provides MOD/MAX A with washing peri area. Pt attempts on one trial to reach, but demos limited tolerance for maintaining stance and completing self care.  Vision Baseline Vision/History: No visual deficits Patient Visual Report: No change from baseline     Perception     Praxis      Cognition Arousal/Alertness: Awake/alert Behavior During Therapy: WFL for tasks assessed/performed Overall Cognitive Status: Within Functional Limits for tasks assessed                                 General Comments: pt with good enunciation for  lip-reading communication d/t trach        Exercises Other Exercises Other Exercises: OT facilitates pt participation in seated and standing ADLs. Pt tolerates ~18 mins sitting EOB to complete UB grooming/hygiene tasks and while OT provides education re: meaning of respiratory rate and where to locate on monitor. Pt tolerates static sitting well. Other Exercises: OT engages pt in STS transfer training x2 with MIN verbal cues for safe hand placement and awareness of lines/leads for safety. Pt demos improving impulse control and good reception of education re: hand placement. pt with only slightly increased RR with sit to stand (over vent settings) to ~20breaths per minute max. Able to resolve withing ~20-30 seconds seated rest break. Other Exercises: Primary increased WOB noted when pt participates in propulsion towards HOB to optimize positioning. RN notified. OT facilitates education re: pacing activity with pt with pt demonstrating moderate reception.   Shoulder Instructions       General Comments      Pertinent Vitals/ Pain       Pain Assessment: 0-10 Pain Score: 4  Pain Location: abdominal pain, reports he's had pain medicine and abd pain is lessened this date. Pain Descriptors / Indicators: Sore;Tender Pain Intervention(s): Limited activity within patient's tolerance;Monitored during session  Home Living                                          Prior Functioning/Environment              Frequency  Min 2X/week        Progress Toward Goals  OT Goals(current goals can now be found in the care plan section)  Progress towards OT goals: Progressing toward goals(goals updated)  Acute Rehab OT Goals Patient Stated Goal: to go home OT Goal Formulation: With patient Time For Goal Achievement: 07/22/19 Potential to Achieve Goals: Good  Plan Discharge plan remains appropriate    Co-evaluation                 AM-PAC OT "6 Clicks" Daily Activity      Outcome Measure   Help from another person eating meals?: None Help from another person taking care of personal grooming?: None Help from another person toileting, which includes using toliet, bedpan, or urinal?: A Lot Help from another person bathing (including washing, rinsing, drying)?: A Lot Help from another person to put on and taking off regular upper body clothing?: A Little Help from another person to put on and taking off regular lower body clothing?: A Lot 6 Click Score: 17    End of Session Equipment Utilized During Treatment: Gait belt;Oxygen;Rolling walker(on vent at time of treatment)  OT Visit Diagnosis: Other abnormalities of gait and mobility (R26.89);Muscle weakness (generalized) (M62.81);Pain Pain - part of body: (abd)   Activity Tolerance Patient tolerated treatment well   Patient Left in bed;with call bell/phone within reach  Nurse Communication Mobility status;Other (comment)(respiratory status w/ light activity)        Time: 2023-3435 OT Time Calculation (min): 38 min  Charges: OT General Charges $OT Visit: 1 Visit OT Treatments $Self Care/Home Management : 23-37 mins $Therapeutic Activity: 8-22 mins  Rejeana Brock, MS, OTR/L ascom [phone] 07/08/19, 4:17 PM

## 2019-07-08 NOTE — Progress Notes (Signed)
   07/08/19 1420  Clinical Encounter Type  Visited With Patient  Visit Type Follow-up  Referral From Chaplain  Consult/Referral To Chaplain  As chaplain walked in the room, patient waved. Chaplain asked how are you feeling? Patient answered, "I won't complaint." Chaplain said if you won't complain, I know that I definitely have nothing to complain about. Chaplain spoke briefly with patient and left.

## 2019-07-08 NOTE — Progress Notes (Signed)
CRITICAL CARE NOTE 49 year old former smoker with a history very severe COPD stage IV, who presented via EMS after being found down and unresponsive. Apparently the patient's mother placed a call to EMS. The patient has a longstanding history of COPD and noncompliance with his oxygen and CPAP. Multiple admission for COPD Of note he had recently been admitted to [facility] 29 May 2019 through 03 June 2019. He had been evaluated by palliative care during this admission and actually had a DNR order.  Patient also had a 2D echo  with pulmonary hypertension consistent with cor pulmonale.  Records review: Patient has been following with Dr. Wallene Huh at [facility].  Last known PFTs 11 September 2017.  FEV1 0.52 L or 20% predicted, FVC 1.49 L or 45% of predicted, FEV1/FVC 35%.  FEF 25-75 6%.  DLCO was 33%.  This is consistent with very severe COPD.  Events: 4/13 admitted for severe resp failure from COPD 4/14 remains critically ill on vent 4/15 remains critically ill, severe COPD, failed SAT/SBT 4/16 extubated yesterday, had to be reintubated remains on ventilator 4/17 sedated on the ventilator 4/18 abdominal distention, CT abdomen and pelvis consistent with ileus and constipation 4/19-patietn remains on ventialtor awainting trach 4/20 - patient is on vent with trach, he had episodes of aggitation today but eventually improved  4/21 - patient with significant distension of abdomen, concern for aerophagia in NIV.  There is mucopurulent exudation from trache this was cultures and patient placed on Unasyn for tracheitis 06/24/19 -trache site improved with less malodorous pus draining, NGT with decompression worked well abdomen is less distended. S/p PEG 06/25/19- patient had some anxiety throughout day today, we have started low dose anxiolytic. His PEG is now being utilized. He has some placement issues with social work due to insurance and we are working on this now.  06/26/19-Patient had no events  today, we are waiting for placement now from transitional care team 06/27/19- Patient is calm this morining, abdomen is mildy distended, awaiting placement to [facility] 4/26 plan for PS/TCT 4/27 Pt failed trach collar twice today. Became visibly distressed, diaphoretic, o2 drops, and accessory muscle use.  4/28increased WOB, resp distress, using accessory muscles to breath while on vent 4/29 patient placed on PS mode, given BD therapy, alert and awake, was able to communicate with PM valve 07/02/19 -trache changed to cuffed size 6 without incident, patient reports more comfort with improved vocalization.  07/03/19- no overnight events, patient sitting up in bed in no distress.  5/3 severe hypoxia and increased WOB, placed back on full vent support 5/4 severe resp distress, can NOT tolerate PS mode, severe resp failure End stage COPD 5/5 severe resp distress, can not tolerate TCT, severe end stage COPD  CC  follow up respiratory failure  SUBJECTIVE Prognosis is guarded Severe COPD Severe resp failure +air trapping   BP 114/82   Pulse 91   Temp (P) 98.2 F (36.8 C) (Oral)   Resp 18   Ht '5\' 6"'  (1.676 m)   Wt 59.9 kg   SpO2 100%   BMI 21.31 kg/m    I/O last 3 completed shifts: In: 3956.7 [I.V.:1338.3; Other:420; NG/GT:2198.4] Out: 2020 [Urine:2020] No intake/output data recorded.  SpO2: 100 % O2 Flow Rate (L/min): 2 L/min FiO2 (%): 28 %  Estimated body mass index is 21.31 kg/m as calculated from the following:   Height as of this encounter: '5\' 6"'  (1.676 m).   Weight as of this encounter: 59.9 kg.  LIMITED ROS DUE  SEVERE COPD  Pressure Injury 06/24/19 Buttocks Right Stage 3 -  Full thickness tissue loss. Subcutaneous fat may be visible but bone, tendon or muscle are NOT exposed. (Active)  06/24/19 0200  Location: Buttocks  Location Orientation: Right  Staging: Stage 3 -  Full thickness tissue loss. Subcutaneous fat may be visible but bone, tendon or muscle are NOT exposed.   Wound Description (Comments):   Present on Admission: No      PHYSICAL EXAMINATION:  GENERAL:critically ill appearing, +resp distress HEAD: Normocephalic, atraumatic.  EYES: Pupils equal, round, reactive to light.  No scleral icterus.  MOUTH: Moist mucosal membrane. NECK: Supple.  PULMONARY: +rhonchi, +wheezing CARDIOVASCULAR: S1 and S2. Regular rate and rhythm. No murmurs, rubs, or gallops.  GASTROINTESTINAL: Soft, nontender, -distended.  Positive bowel sounds.   MUSCULOSKELETAL: No swelling, clubbing, or edema.  NEUROLOGIC: alert and awake, agitated SKIN:intact,warm,dry  MEDICATIONS: I have reviewed all medications and confirmed regimen as documented   CULTURE RESULTS   No results found for this or any previous visit (from the past 240 hour(s)).        IMAGING    No results found.   Nutrition Status: Nutrition Problem: Severe Malnutrition Etiology: chronic illness(COPD) Signs/Symptoms: severe fat depletion, severe muscle depletion Interventions: Refer to RD note for recommendations     Indwelling Urinary Catheter continued, requirement due to   Reason to continue Indwelling Urinary Catheter strict Intake/Output monitoring for hemodynamic instability   Central Line/ continued, requirement due to  Reason to continue [place] of central venous pressure or other hemodynamic parameters and poor IV access   Ventilator continued, requirement due to severe respiratory failure      ASSESSMENT AND PLAN SYNOPSIS   Severe ACUTE Hypoxic and Hypercapnic Respiratory Failure due to end stage COPD and Vent dependant reps failure -continue Full MV support -continue Bronchodilator Therapy -Wean Fio2 and PEEP as tolerated -will perform SAT/SBT when respiratory parameters are met -VAP/VENT bundle implementation  ACUTE DIASTOLIC CARDIAC FAILURE- PULM HTN S/p trach Will need vent indefinitely  SEVERE COPD EXACERBATION -continue IV steroids as  prescribed -continue NEB THERAPY as prescribed -morphine as needed -wean fio2 as needed and tolerated   Severe hypoglycemia On d10 Wean off as tolerated Increased caloric intake On steroids    CARDIAC ICU monitoring    GI GI PROPHYLAXIS as indicated  NUTRITIONAL STATUS Nutrition Status: Nutrition Problem: Severe Malnutrition Etiology: chronic illness(COPD) Signs/Symptoms: severe fat depletion, severe muscle depletion Interventions: Refer to RD note for recommendations   DIET-->TF's as tolerated Constipation protocol as indicated  ENDO - will use ICU hypoglycemic\Hyperglycemia protocol if indicated   ELECTROLYTES -follow labs as needed -replace as needed -pharmacy consultation and following   DVT/GI PRX ordered TRANSFUSIONS AS NEEDED MONITOR FSBS ASSESS the need for LABS as needed   Critical Care Time devoted to patient care services described in this note is 32 minutes.   Overall, patient is critically ill, prognosis is guarded.     Corrin Parker, M.D.  [facility]  Medical Director [place] Director [facility] Cardio-Pulmonary Department

## 2019-07-08 NOTE — Progress Notes (Signed)
Trach cleaning education done with pt room-mate. She understood directions and took extreme care with trach. Instructions/Demonstration on changing trach ties done.   Education on high peak pressures due to wet HME or dirty inner cannula that needs to be changed. Also education to room-mate and pt regarding possibility of plugged trach and instructions given to remove inner cannula and replace with new one. Pt and room-mate expressed understanding for all education today.  Will repeat education when room-mate available.

## 2019-07-08 NOTE — Progress Notes (Signed)
PHARMACY CONSULT NOTE  Pharmacy Consult for Electrolyte Monitoring and Replacement   Recent Labs: Potassium (mmol/L)  Date Value  07/08/2019 3.8  09/24/2013 4.6   Magnesium (mg/dL)  Date Value  69/45/0388 1.7   Calcium (mg/dL)  Date Value  82/80/0349 8.7 (L)   Calcium, Total (mg/dL)  Date Value  17/91/5056 8.9   Albumin (g/dL)  Date Value  97/94/8016 3.5   Phosphorus (mg/dL)  Date Value  55/37/4827 3.8   Sodium (mmol/L)  Date Value  07/08/2019 134 (L)  09/24/2013 138   Corrected Ca: 9.10 mg/dL   Assessment: 49 year old male admitted after being found unresponsive and admitted with end stage COPD. Patient was intubated in the ED and transferred to the ICU. Patient started on tube feeds and at refeeding risk.   Patient extubated 4/15 am and placed on BiPAP. Patient required reintubation. Currently post-tracheostomy.  Trach 4/20, G-tube placement 4/22.  Electrolytes: Goal: potassium 4.0 - 5.1 mmol/L, magnesium 2.0 - 2.4 mg/dL, all other electrolytes WNL   Oral potassium VT x 1  Magnesium sulfate 2 grams IV x 1  F/U Electrolytes with AM labs 5/7  Glucose: Hydrocortisone 50mg  IV Q6hr day  #4  BG<180: no intervention required  D10 stopped  Continue to follow glucose  Constipation: LBM 5/6 and currently receiving senna-docusate 2 tab VT BID and miralax daily  Stop Senna and Miralax  Will continue to monitor.   Pharmacy will continue to monitor and adjust per consult.   , RPh 07/08/2019 7:08 AM

## 2019-07-09 DIAGNOSIS — J449 Chronic obstructive pulmonary disease, unspecified: Secondary | ICD-10-CM

## 2019-07-09 LAB — CBC WITH DIFFERENTIAL/PLATELET
Abs Immature Granulocytes: 0.1 10*3/uL — ABNORMAL HIGH (ref 0.00–0.07)
Basophils Absolute: 0 10*3/uL (ref 0.0–0.1)
Basophils Relative: 0 %
Eosinophils Absolute: 0 10*3/uL (ref 0.0–0.5)
Eosinophils Relative: 0 %
HCT: 28.5 % — ABNORMAL LOW (ref 39.0–52.0)
Hemoglobin: 8.7 g/dL — ABNORMAL LOW (ref 13.0–17.0)
Immature Granulocytes: 1 %
Lymphocytes Relative: 7 %
Lymphs Abs: 0.7 10*3/uL (ref 0.7–4.0)
MCH: 30.3 pg (ref 26.0–34.0)
MCHC: 30.5 g/dL (ref 30.0–36.0)
MCV: 99.3 fL (ref 80.0–100.0)
Monocytes Absolute: 0.7 10*3/uL (ref 0.1–1.0)
Monocytes Relative: 7 %
Neutro Abs: 8.5 10*3/uL — ABNORMAL HIGH (ref 1.7–7.7)
Neutrophils Relative %: 85 %
Platelets: 152 10*3/uL (ref 150–400)
RBC: 2.87 MIL/uL — ABNORMAL LOW (ref 4.22–5.81)
RDW: 13.5 % (ref 11.5–15.5)
WBC: 10 10*3/uL (ref 4.0–10.5)
nRBC: 0 % (ref 0.0–0.2)

## 2019-07-09 LAB — GLUCOSE, CAPILLARY
Glucose-Capillary: 140 mg/dL — ABNORMAL HIGH (ref 70–99)
Glucose-Capillary: 115 mg/dL — ABNORMAL HIGH (ref 70–99)
Glucose-Capillary: 78 mg/dL (ref 70–99)
Glucose-Capillary: 118 mg/dL — ABNORMAL HIGH (ref 70–99)

## 2019-07-09 LAB — PHOSPHORUS: Phosphorus: 4.1 mg/dL (ref 2.5–4.6)

## 2019-07-09 LAB — BASIC METABOLIC PANEL
Anion gap: 8 (ref 5–15)
BUN: 21 mg/dL — ABNORMAL HIGH (ref 6–20)
CO2: 37 mmol/L — ABNORMAL HIGH (ref 22–32)
Calcium: 9.1 mg/dL (ref 8.9–10.3)
Chloride: 93 mmol/L — ABNORMAL LOW (ref 98–111)
Creatinine, Ser: 0.4 mg/dL — ABNORMAL LOW (ref 0.61–1.24)
GFR calc Af Amer: >60 mL/min (ref >60)
GFR calc non Af Amer: >60 mL/min (ref >60)
Glucose, Bld: 137 mg/dL — ABNORMAL HIGH (ref 70–99)
Potassium: 4.2 mmol/L (ref 3.5–5.1)
Sodium: 138 mmol/L (ref 135–145)

## 2019-07-09 LAB — MAGNESIUM: Magnesium: 2 mg/dL (ref 1.7–2.4)

## 2019-07-09 MED ORDER — HYDROCORTISONE NA SUCCINATE PF 100 MG IJ SOLR
50.0000 mg | Freq: Two times a day (BID) | INTRAMUSCULAR | Status: AC
  Administered 2019-07-09 – 2019-07-11 (×4): 50 mg via INTRAVENOUS
  Filled 2019-07-09 (×4): qty 2

## 2019-07-09 NOTE — Progress Notes (Signed)
Occupational Therapy Treatment Patient Details Name: Chris Case MRN: [id number] DOB: Aug 08, 1970 Today's Date: 07/09/2019    History of present illness Chris Case is a 76yoM who comes to [facility] on 4/13 after being found unresponsive. Pt here 29 May 2019 through 03 June 2019. Pt required ventilation upon arrival, failed extubation on 4/16, transitioned to tach on 4/20. Pt has required NGT decompression d/t ABD distension, underwent  PEG placement and lysis adhesions 4/22. 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   OT comments  Pt seen for OT treatment this date to f/u re: tolerance for ADLs/ADL mobility. Pt requires MIN A with RW for STS t/f x1 trials and then MIN A +2 (2nd person to manage lines/leads) to SPS to recliner adjacent to bed. While seated in chair, OT engages pt in UE therex to improve strength as well as fxl activity tolerance as it pertains to ADLs. Pt with good tolerance, on vent throughout OT session. SpO2 >97% throughout. RR and WOB do increase briefly with transfers and therex, resolve to vent setting default w/in ~20 secs seated rest break. Overall LTACH versus SNF remain safest d/c recommendations.   Follow Up Recommendations  LTACH;SNF    Equipment Recommendations  3 in 1 bedside commode    Recommendations for Other Services      Precautions / Restrictions Precautions Precautions: Fall Precaution Comments: monitor O2, HR, and BP; trach collar vs mechanical ventilation; HOB >30 degrees; NPO; PEG tube; R PICC line Restrictions Weight Bearing Restrictions: No       Mobility Bed Mobility Overal bed mobility: Needs Assistance Bed Mobility: Supine to Sit     Supine to sit: Supervision;HOB elevated        Transfers Overall transfer level: Needs assistance Equipment used: Rolling walker (2 wheeled) Transfers: Sit to/from Stand Sit to Stand: Min guard;Min assist Stand pivot transfers: Min assist;+2  safety/equipment(CNA manages lines for pt to SPS to chair)       General transfer comment: Pt on vent t/o this session with RT prepping to switch to trach collar as tolerable after OT t/f's pt. Pt tolerates one STS transfer with RW and tolerates 2 min static stand to increase fxl activity tolerance. Pt then completes SPS with CGA/MIN A and use of RW with OT while CNA managing lines.    Balance Overall balance assessment: Needs assistance Sitting-balance support: No upper extremity supported;Feet supported Sitting balance-Leahy Scale: Good     Standing balance support: Bilateral upper extremity supported Standing balance-Leahy Scale: Good Standing balance comment: req's cues to extend posture in standing                           ADL either performed or assessed with clinical judgement   ADL                                               Vision Baseline Vision/History: No visual deficits Patient Visual Report: No change from baseline     Perception     Praxis      Cognition Arousal/Alertness: Awake/alert Behavior During Therapy: WFL for tasks assessed/performed Overall Cognitive Status: Within Functional Limits for tasks assessed  Exercises Other Exercises Other Exercises: OT facilitates pt participation in seated (in chair) UB therex 1 set x10 reps per arm, alternating FWD punches. In addition, OT encouarges 1 setx8 reps chair push ups (more difficulty noted with this exercise). RT presents and states that pt overal tolerating well-only mildly breathing over vent (settings at 15breaths per minute, pt breathing at ~18-20 after therex, quickly resolves).   Shoulder Instructions       General Comments      Pertinent Vitals/ Pain       Pain Assessment: No/denies pain(RN reports giving morphine and xanax prior to session)  Home Living                                           Prior Functioning/Environment              Frequency  Min 2X/week        Progress Toward Goals  OT Goals(current goals can now be found in the care plan section)  Progress towards OT goals: Progressing toward goals  Acute Rehab OT Goals Patient Stated Goal: to go home OT Goal Formulation: With patient Time For Goal Achievement: 07/22/19 Potential to Achieve Goals: Good  Plan Discharge plan remains appropriate    Co-evaluation                 AM-PAC OT "6 Clicks" Daily Activity     Outcome Measure   Help from another person eating meals?: None Help from another person taking care of personal grooming?: None Help from another person toileting, which includes using toliet, bedpan, or urinal?: A Lot Help from another person bathing (including washing, rinsing, drying)?: A Lot Help from another person to put on and taking off regular upper body clothing?: A Little Help from another person to put on and taking off regular lower body clothing?: A Lot 6 Click Score: 17    End of Session Equipment Utilized During Treatment: Gait belt;Oxygen;Rolling walker  OT Visit Diagnosis: Other abnormalities of gait and mobility (R26.89);Muscle weakness (generalized) (M62.81)   Activity Tolerance Patient tolerated treatment well   Patient Left in chair;with call bell/phone within reach;with chair alarm set   Nurse Communication Mobility status;Other (comment)(resp status)        Time: 8299-3716 OT Time Calculation (min): 32 min  Charges: OT General Charges $OT Visit: 1 Visit OT Treatments $Therapeutic Activity: 8-22 mins $Therapeutic Exercise: 8-22 mins  Rejeana Brock, MS, OTR/L ascom [phone] 07/09/19, 2:41 PM

## 2019-07-09 NOTE — Progress Notes (Signed)
PHARMACY CONSULT NOTE  Pharmacy Consult for Electrolyte Monitoring and Replacement   Recent Labs: Potassium (mmol/L)  Date Value  07/09/2019 4.2  09/24/2013 4.6   Magnesium (mg/dL)  Date Value  38/45/3646 2.0   Calcium (mg/dL)  Date Value  80/32/1224 9.1   Calcium, Total (mg/dL)  Date Value  82/50/0370 8.9   Albumin (g/dL)  Date Value  48/88/9169 3.5   Phosphorus (mg/dL)  Date Value  45/05/8880 4.1   Sodium (mmol/L)  Date Value  07/09/2019 138  09/24/2013 138    Assessment: 49 year old male admitted after being found unresponsive and admitted with end stage COPD. Patient was intubated in the ED and transferred to the ICU. Patient started on tube feeds and at refeeding risk. He remains intubated and sedated following a failed Trach Collar this morning   Electrolytes: Goal: potassium 4.0 - 5.1 mmol/L, magnesium 2.0 - 2.4 mg/dL, all other electrolytes WNL   Electrolytes WNL: no intervention required  F/U Electrolytes with AM labs 5/8  Glucose: Hydrocortisone 50mg  IV Q12hr day  #5, tapered 5/7 w/ stop date of 7 days  BG<180: no intervention required  Continue to follow glucose q6h  Constipation: LBM 5/7 and on no laxative medications ordered  No intervention required  Will continue to monitor.   Pharmacy will continue to monitor and adjust per consult.   7/7, RPh 07/09/2019 7:05 AM

## 2019-07-09 NOTE — TOC Progression Note (Addendum)
Transition of Care [facility]) - Progression Note    Patient Details  Name: Elion Hocker MRN: [id number] Date of Birth: [birth year]  Transition of Care [facility]) CM/SW [place], LCSW Phone Number: 07/09/2019, 2:29 PM  Clinical Narrative:   CSW called [employer] Representative Juliann Pulse and informed her that trach training has been done by our RT here at [facility]. She reported she will update their RT team at [employer] so next steps can be started.   CSW also received a voicemail from [place] with [facility]. Returned call to [place]. He reported they met with [name] (caregiver/roommate) today and it went well. He reported they are working on paperwork that [name] must do. He said he will email paperwork for CSW to print for [name] as she does not have a Personal assistant. CSW printed paperwork and left with patient's chart in ICU. RN aware.    Expected Discharge Plan: [place] Barriers to Discharge: Continued Medical Work up  Expected Discharge Plan and Services Expected Discharge Plan: [place] arrangements for the past 2 months: [place]: [place] ([facility])     Representative spoke with at [place]: [place] (SDOH) Interventions    Readmission Risk Interventions Readmission Risk Prevention Plan 06/29/2019  Transportation Screening Complete  PCP or Specialist Appt within 3-5 Days Complete  HRI or Home Care Consult Complete  Palliative Care Screening Complete  Medication Review (RN Care Manager) Complete  Some recent data might be hidden

## 2019-07-09 NOTE — Progress Notes (Signed)
Tried patient on [employer]. Only tolerated for . Increased work of breathing and dropped sats in the 70's. Placed back on vent. RN in room and aware of change.

## 2019-07-09 NOTE — Progress Notes (Signed)
CRITICAL CARE NOTE 49 year old former smoker with a history very severe COPD stage IV, who presented via EMS after being found down and unresponsive. Apparently the patient's mother placed a call to EMS. The patient has a longstanding history of COPD and noncompliance with his oxygen and CPAP. Multiple admission for COPD Of note he had recently been admitted to [facility] 29 May 2019 through 03 June 2019. He had been evaluated by palliative care during this admission and actually had a DNR order.  Patient also had a 2D echo  with pulmonary hypertension consistent with cor pulmonale.  Records review: Patient previously followed with Dr. Ned Clines at [facility].  Last known PFTs 11 September 2017.  FEV1 0.52 L or 20% predicted, FVC 1.49 L or 45% of predicted, FEV1/FVC 35%.  FEF 25-75 6%.  DLCO was 33%.  This is consistent with very severe COPD.  Events: 4/13 admitted for severe resp failure from COPD 4/14 remains critically ill on vent 4/15 remains critically ill, severe COPD, failed SAT/SBT 4/16 extubated yesterday, had to be reintubated remains on ventilator 4/17 sedated on the ventilator 4/18 abdominal distention, CT abdomen and pelvis consistent with ileus and constipation 4/19-patietn remains on ventialtor awainting trach 4/20 - patient is on vent with trach, he had episodes of aggitation today but eventually improved  4/21 - patient with significant distension of abdomen, concern for aerophagia in NIV.  There is mucopurulent exudation from trache this was cultures and patient placed on Unasyn for tracheitis 06/24/19 -trache site improved with less malodorous pus draining, NGT with decompression worked well abdomen is less distended. S/p PEG 06/25/19- patient had some anxiety throughout day today, we have started low dose anxiolytic. His PEG is now being utilized. He has some placement issues with social work due to insurance and we are working on this now.  06/26/19-Patient had no events  today, we are waiting for placement now from transitional care team 06/27/19- Patient is calm this morining, abdomen is mildy distended, awaiting placement to [facility] 4/26 plan for PS/TCT 4/27 Pt failed trach collar twice today. Became visibly distressed, diaphoretic, o2 drops, and accessory muscle use.  4/28increased WOB, resp distress, using accessory muscles to breath while on vent 4/29 patient placed on PS mode, given BD therapy, alert and awake, was able to communicate with PM valve 07/02/19 -trache changed to cuffed size 6 without incident, patient reports more comfort with improved vocalization.  07/03/19- no overnight events, patient sitting up in bed in no distress.  5/3 severe hypoxia and increased WOB, placed back on full vent support 5/4 severe resp distress, can NOT tolerate PS mode, severe resp failure End stage COPD 5/5 severe resp distress, can not tolerate TCT, severe end stage COPD 5/7 sitting up in chair, able to do some pressure support trials but no trach collar trials.  Still appears very tachypneic  CC  follow up respiratory failure  SUBJECTIVE Severe COPD Severe resp failure +air trapping Flattened diaphragms consistent with end-stage COPD  BP (!) 118/92   Pulse 87   Temp 98.7 F (37.1 C) (Oral)   Resp 13   Ht 5\' 6"  (1.676 m)   Wt 59.7 kg   SpO2 100%   BMI 21.24 kg/m    I/O last 3 completed shifts: In: 3471.9 [I.V.:316.9; Other:330; NG/GT:2775; IV Piggyback:50] Out: 1495 [Urine:1495] Total I/O In: 1160 [Other:335; NG/GT:825] Out: 200 [Urine:200]  SpO2: 100 % O2 Flow Rate (L/min): 2 L/min FiO2 (%): 28 %  Estimated body mass index is 21.24  kg/m as calculated from the following:   Height as of this encounter: 5\' 6"  (1.676 m).   Weight as of this encounter: 59.7 kg.  LIMITED ROS DUE SEVERE COPD  Pressure Injury 06/24/19 Buttocks Right Stage 3 -  Full thickness tissue loss. Subcutaneous fat may be visible but bone, tendon or muscle are NOT exposed.  (Active)  06/24/19 0200  Location: Buttocks  Location Orientation: Right  Staging: Stage 3 -  Full thickness tissue loss. Subcutaneous fat may be visible but bone, tendon or muscle are NOT exposed.  Wound Description (Comments):   Present on Admission: No      PHYSICAL EXAMINATION:  GENERAL: Very thin male, temporal wasting noted, sitting up in chair, trach in place.  Using accessories of respiration even on vent. HEAD: Normocephalic, atraumatic.  Bitemporal wasting. EYES: Pupils midpoint equal, round, reactive to light. No scleral icterus.  MOUTH: Oral mucosa moist. NECK: Supple. No thyromegaly. JVD present. Trachea midline, tracheostomy in place. PULMONARY: Very distant breath sounds with poor air movement.   Use of accessories even on the vent.  No other adventitious sounds.   CARDIOVASCULAR: S1 and S2. Regular rate and rhythm. No murmurs heard. GASTROINTESTINAL: Abdomen is soft, nondistended, normoactive bowel sounds. MUSCULOSKELETAL: No joint deformity,no edema, + clubbing about the fingers.  NEUROLOGIC:  Awake, alert, nods appropriately.  Follows commands.  No overt focal deficits. SKIN: Intact,warm,dry. No rashes noted. PSYCH: Mood seems to be appropriate  MEDICATIONS: I have reviewed all medications and confirmed regimen as documented   CULTURE RESULTS   No results found for this or any previous visit (from the past 240 hour(s)).        IMAGING    No results found.   Nutrition Status: Nutrition Problem: Severe Malnutrition Etiology: chronic illness(COPD) Signs/Symptoms: severe fat depletion, severe muscle depletion Interventions: Refer to RD note for recommendations     Indwelling Urinary Catheter  no Foley   Reason to continue Indwelling Urinary Catheter    Central Line/  continued, requirement due to  Reason to continue [employer]  poor IV access.  PICC line, right brachial   Ventilator continued, requirement due to severe respiratory failure       ASSESSMENT AND PLAN SYNOPSIS   Acute on chronic hypercarbic/hypoxic respiratory failure COPD exacerbation Patient is end-stage disease Likely will be ventilator dependent and unable to wean Continue ventilator support Continue trials with pressure support as tolerates Has not tolerated trach collar trials Will  be ventilator dependent for life Bronchodilators: DuoNeb every 4 and as needed IV corticosteroids, weaning There continues to be no indication for antibiotics at present   Severe COPD stage IV with exacerbation Noted noncompliance with oxygen/BiPAP at home Bronchodilators as above IV corticosteroids Exacerbation seems to have subsided Patient is DNR He will remain ventilator dependent for life Making arrangements for home with ventilator support  Acute encephalopathy Unresponsiveness  Due to hypercarbia RESOLVED  Currently interactive PT/OT   Cor pulmonale with decompensation Pulmonary hypertension Management of issues as above Poor prognostic sign No diastolic dysfunction by recent echo Right ventricular failure   Hypoglycemia Due to poor nutritional status/catabolic state Continue tube feeds, requiring increased amount of calories Weaned off D10 Wean off steroids as tolerated  Poor prognostic sign  Acute kidney injury Avoid nephrotoxins Stable with no worsening Trend renal panel  Hyperkalemia Likely triggered by acidosis RESOLVED K  4.2 today  ID No indication for antibiotics at present Continue to trend WBC/fever curve  Prophylaxis: Lovenox Famotidine  NUTRITIONAL STATUS Nutrition  Status: Nutrition Problem: Severe Malnutrition Etiology: chronic illness(COPD) Signs/Symptoms: severe fat depletion, severe muscle depletion Interventions: Refer to RD note for recommendations   Multidisciplinary rounds were performed with ICU team today.  Critical Care Time devoted to patient care services described in this note is  35 minutes.   Overall, patient is chronically critically ill, prognosis is guarded, long-term prognosis poor.     Gailen Shelter, MD [place] [facility]    *This note was dictated using voice recognition software/Dragon.  Despite best efforts to proofread, errors can occur which can change the meaning.  Any change was purely unintentional.

## 2019-07-09 NOTE — Evaluation (Addendum)
Physical Therapy ReEvaluation Patient Details Name: Chris Case MRN: [id number] DOB: 1970/07/28 Today's Date: 07/09/2019   History of Present Illness  Chris Case is a 48yoM who comes to [facility] on 4/13 after being found unresponsive. Pt here 29 May 2019 through 03 June 2019. Pt required ventilation upon arrival, failed extubation on 4/16, transitioned to tach on 4/20. Pt has required NGT decompression d/t ABD distension, underwent  PEG placement and lysis adhesions 4/22. 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 Pt has since been trialed on PMV but has not been off vent support enough long enough to trial much, limited tolerance to tach collor O2.  Clinical Impression  Pt admitted with above diagnosis. Pt currently with functional limitations due to the deficits listed below (see "PT Problem List"). Upon entry, pt in recliner, awake and agreeable to participate. The pt is alert and oriented x4, pleasant, conversational, and generally a good historian. Pt failed trach collar trial this am, remains on spontaneous vent support via trach, 28% FiO2. Pt shows generally good strength with exercises in recliner, but does require lengthy rest intervals to recover breathing. Pt much better at self regulating efforts and calming techniques. Pt able to rise to standing with moderate effort, HHA for balance support. AMB deferred due to easily aggravated respiratory distress. Functional mobility assessment demonstrates increased effort/time requirements, poor tolerance, and need for physical assistance, whereas the patient performed these at a higher level of independence PTA. Pt will benefit from skilled PT intervention to increase independence and safety with basic mobility in preparation for discharge to the venue listed below.       Follow Up Recommendations LTACH;Supervision for mobility/OOB;Supervision/Assistance - 24 hour(Does not have LTACH benefit)     Equipment Recommendations  Wheelchair (measurements PT);Wheelchair cushion (measurements PT);Hospital bed;3in1 (PT);Rolling walker with 5" wheels    Recommendations for Other Services       Precautions / Restrictions Precautions Precautions: Fall Precaution Comments: monitor O2, HR, and BP; trach collar vs mechanical ventilation; HOB >30 degrees; NPO; PEG tube; R PICC line Restrictions Weight Bearing Restrictions: No      Mobility  Bed Mobility Overal bed mobility: Needs Assistance Bed Mobility: Supine to Sit     Supine to sit: Supervision;HOB elevated     General bed mobility comments: pt up to chair  Transfers Overall transfer level: Needs assistance Equipment used: 1 person hand held assist Transfers: Sit to/from Stand Sit to Stand: Min assist;Min guard Stand pivot transfers: Min assist;+2 safety/equipment(CNA manages lines for pt to SPS to chair)       General transfer comment: rises with moderate effort, able to remain standing for ~30sec without adverse response. Good control with return to chair.  Ambulation/Gait             General Gait Details: Deferred d/t increased SOB and work of breathing with standing EOB. limited by vent.  Stairs            Wheelchair Mobility    Modified Rankin (Stroke Patients Only)       Balance Overall balance assessment: Needs assistance Sitting-balance support: No upper extremity supported;Feet supported Sitting balance-Leahy Scale: Good     Standing balance support: Bilateral upper extremity supported Standing balance-Leahy Scale: Good Standing balance comment: req's cues to extend posture in standing  Pertinent Vitals/Pain Pain Assessment: Faces Faces Pain Scale: Hurts little more Pain Location: some ABD pain from GTube. Pain Descriptors / Indicators: Sore;Tender Pain Intervention(s): Limited activity within patient's tolerance;Monitored during  session;Premedicated before session    Home Living Family/patient expects to be discharged to:: Private residence Living Arrangements: Non-relatives/Friends Available Help at Discharge: Friend(s);Available PRN/intermittently Type of Home: House Home Access: Stairs to enter Entrance Stairs-Rails: Right Entrance Stairs-Number of Steps: 3 Home Layout: One level Home Equipment: Grab bars - tub/shower;Shower seat;Cane - single point Additional Comments: Pt reports roommate has a few canes    Prior Function           Comments: Previouslty using O2 at home, tank, concentrator, and CPAP.     Hand Dominance   Dominant Hand: Right    Extremity/Trunk Assessment   Upper Extremity Assessment Upper Extremity Assessment: Overall WFL for tasks assessed    Lower Extremity Assessment Lower Extremity Assessment: Overall WFL for tasks assessed    Cervical / Trunk Assessment Cervical / Trunk Assessment: Normal  Communication   Communication: Tracheostomy;Passy-Muir valve(unable to use PMV while on vent support)  Cognition Arousal/Alertness: Awake/alert Behavior During Therapy: WFL for tasks assessed/performed Overall Cognitive Status: Within Functional Limits for tasks assessed                                 General Comments: pt with good enunciation for lip-reading communication d/t trach      General Comments      Exercises Total Joint Exercises Towel Squeeze: AROM;Strengthening;Both;10 reps;Seated;Limitations Towel Squeeze Limitations: 10x3secH General Exercises - Lower Extremity Ankle Circles/Pumps: AROM;Strengthening;Both;Seated;15 reps;Limitations Ankle Circles/Pumps Limitations: folrefoot elevated on wash basin to allow for full available range; then gravity resisted ankle DF Long Arc Quad: AROM;Both;Seated;15 reps Hip Flexion/Marching: AROM;Seated;Both;15 reps;Limitations Hip Flexion/Marching Limitations: needs rest between Other Exercises Other  Exercises: OT facilitates pt participation in seated (in chair) UB therex 1 set x10 reps per arm, alternating FWD punches. In addition, OT encouarges 1 setx8 reps chair push ups (more difficulty noted with this exercise). RT presents and states that pt overal tolerating well-only mildly breathing over vent (settings at 15breaths per minute, pt breathing at ~18-20 after therex, quickly resolves).   Assessment/Plan    PT Assessment Patient needs continued PT services  PT Problem List Decreased strength;Decreased mobility;Decreased activity tolerance;Decreased balance;Decreased cognition;Decreased knowledge of precautions;Decreased safety awareness       PT Treatment Interventions DME instruction;Gait training;Stair training;Functional mobility training;Therapeutic activities;Therapeutic exercise;Balance training;Patient/family education    PT Goals (Current goals can be found in the Care Plan section)  Acute Rehab PT Goals Patient Stated Goal: to go home PT Goal Formulation: With patient Time For Goal Achievement: 07/23/19 Potential to Achieve Goals: Poor    Frequency Min 2X/week   Barriers to discharge        Co-evaluation               AM-PAC PT "6 Clicks" Mobility  Outcome Measure Help needed turning from your back to your side while in a flat bed without using bedrails?: A Little Help needed moving from lying on your back to sitting on the side of a flat bed without using bedrails?: A Little Help needed moving to and from a bed to a chair (including a wheelchair)?: A Little Help needed standing up from a chair using your arms (e.g., wheelchair or bedside chair)?: A Little Help needed to walk in hospital room?: A Lot  Help needed climbing 3-5 steps with a railing? : Total 6 Click Score: 15    End of Session Equipment Utilized During Treatment: Oxygen Activity Tolerance: Patient tolerated treatment well;Treatment limited secondary to medical complications (Comment);No  increased pain(maintaining activity that does not trigger aggonal breathing) Patient left: with call bell/phone within reach;in chair;with chair alarm set Nurse Communication: Mobility status;Precautions PT Visit Diagnosis: Unsteadiness on feet (R26.81);Muscle weakness (generalized) (M62.81);History of falling (Z91.81);Other abnormalities of gait and mobility (R26.89)    Time: 3299-2426 PT Time Calculation (min) (ACUTE ONLY): 42 min   Charges:   PT Evaluation $PT Re-evaluation: 1 Re-eval PT Treatments $Therapeutic Exercise: 23-37 mins        3:49 PM, 07/09/19 Chris Case, PT, DPT Physical Therapist - [facility]  [phone] (ASCOM)    Chris Case 07/09/2019, 3:46 PM

## 2019-07-10 LAB — CBC WITH DIFFERENTIAL/PLATELET
Abs Immature Granulocytes: 0.14 10*3/uL — ABNORMAL HIGH (ref 0.00–0.07)
Basophils Absolute: 0 10*3/uL (ref 0.0–0.1)
Basophils Relative: 0 %
Eosinophils Absolute: 0 10*3/uL (ref 0.0–0.5)
Eosinophils Relative: 0 %
HCT: 27.3 % — ABNORMAL LOW (ref 39.0–52.0)
Hemoglobin: 8.8 g/dL — ABNORMAL LOW (ref 13.0–17.0)
Immature Granulocytes: 1 %
Lymphocytes Relative: 13 %
Lymphs Abs: 1.3 10*3/uL (ref 0.7–4.0)
MCH: 31.1 pg (ref 26.0–34.0)
MCHC: 32.2 g/dL (ref 30.0–36.0)
MCV: 96.5 fL (ref 80.0–100.0)
Monocytes Absolute: 0.9 10*3/uL (ref 0.1–1.0)
Monocytes Relative: 8 %
Neutro Abs: 7.9 10*3/uL — ABNORMAL HIGH (ref 1.7–7.7)
Neutrophils Relative %: 78 %
Platelets: 147 10*3/uL — ABNORMAL LOW (ref 150–400)
RBC: 2.83 MIL/uL — ABNORMAL LOW (ref 4.22–5.81)
RDW: 13.8 % (ref 11.5–15.5)
WBC: 10.2 10*3/uL (ref 4.0–10.5)
nRBC: 0.2 % (ref 0.0–0.2)

## 2019-07-10 LAB — MAGNESIUM: Magnesium: 1.9 mg/dL (ref 1.7–2.4)

## 2019-07-10 LAB — BASIC METABOLIC PANEL
Anion gap: 9 (ref 5–15)
BUN: 29 mg/dL — ABNORMAL HIGH (ref 6–20)
CO2: 36 mmol/L — ABNORMAL HIGH (ref 22–32)
Calcium: 9 mg/dL (ref 8.9–10.3)
Chloride: 91 mmol/L — ABNORMAL LOW (ref 98–111)
Creatinine, Ser: 0.38 mg/dL — ABNORMAL LOW (ref 0.61–1.24)
GFR calc Af Amer: >60 mL/min (ref >60)
GFR calc non Af Amer: >60 mL/min (ref >60)
Glucose, Bld: 110 mg/dL — ABNORMAL HIGH (ref 70–99)
Potassium: 3.9 mmol/L (ref 3.5–5.1)
Sodium: 136 mmol/L (ref 135–145)

## 2019-07-10 LAB — PHOSPHORUS: Phosphorus: 4.8 mg/dL — ABNORMAL HIGH (ref 2.5–4.6)

## 2019-07-10 LAB — GLUCOSE, CAPILLARY
Glucose-Capillary: 103 mg/dL — ABNORMAL HIGH (ref 70–99)
Glucose-Capillary: 140 mg/dL — ABNORMAL HIGH (ref 70–99)
Glucose-Capillary: 90 mg/dL (ref 70–99)

## 2019-07-10 MED ORDER — MAGNESIUM SULFATE IN D5W 1-5 GM/100ML-% IV SOLN
1.0000 g | Freq: Once | INTRAVENOUS | Status: AC
  Administered 2019-07-10: 1 g via INTRAVENOUS
  Filled 2019-07-10: qty 100

## 2019-07-10 MED ORDER — POTASSIUM CHLORIDE 20 MEQ PO PACK
20.0000 meq | PACK | Freq: Once | ORAL | Status: AC
  Administered 2019-07-10: 20 meq
  Filled 2019-07-10: qty 1

## 2019-07-10 NOTE — Progress Notes (Addendum)
CRITICAL CARE NOTE 49 year old former smoker with a history very severe COPD stage IV, who presented via EMS after being found down and unresponsive. Apparently the patient's mother placed a call to EMS. The patient has a longstanding history of COPD and noncompliance with his oxygen and CPAP. Multiple admission for COPD Of note he had recently been admitted to [facility] 29 May 2019 through 03 June 2019. He had been evaluated by palliative care during this admission and actually had a DNR order.  Patient also had a 2D echo  with pulmonary hypertension consistent with cor pulmonale.  Records review: Patient previously followed with Dr. Ned Clines at [facility].  Last known PFTs 11 September 2017.  FEV1 0.52 L or 20% predicted, FVC 1.49 L or 45% of predicted, FEV1/FVC 35%.  FEF 25-75 6%.  DLCO was 33%.  This is consistent with very severe COPD.  Events: 4/13 admitted for severe resp failure from COPD 4/14 remains critically ill on vent 4/15 remains critically ill, severe COPD, failed SAT/SBT 4/16 extubated yesterday, had to be reintubated remains on ventilator 4/17 sedated on the ventilator 4/18 abdominal distention, CT abdomen and pelvis consistent with ileus and constipation 4/19-patietn remains on ventialtor awainting trach 4/20 - patient is on vent with trach, he had episodes of aggitation today but eventually improved  4/21 - patient with significant distension of abdomen, concern for aerophagia in NIV.  There is mucopurulent exudation from trache this was cultures and patient placed on Unasyn for tracheitis 06/24/19 -trache site improved with less malodorous pus draining, NGT with decompression worked well abdomen is less distended. S/p PEG 06/25/19- patient had some anxiety throughout day today, we have started low dose anxiolytic. His PEG is now being utilized. He has some placement issues with social work due to insurance and we are working on this now.  06/26/19-Patient had no events  today, we are waiting for placement now from transitional care team 06/27/19- Patient is calm this morining, abdomen is mildy distended, awaiting placement to [facility] 4/26 plan for PS/TCT 4/27 Pt failed trach collar twice today. Became visibly distressed, diaphoretic, o2 drops, and accessory muscle use.  4/28increased WOB, resp distress, using accessory muscles to breath while on vent 4/29 patient placed on PS mode, given BD therapy, alert and awake, was able to communicate with PM valve 07/02/19 -trache changed to cuffed size 6 without incident, patient reports more comfort with improved vocalization.  07/03/19- no overnight events, patient sitting up in bed in no distress.  5/3 severe hypoxia and increased WOB, placed back on full vent support 5/4 severe resp distress, can NOT tolerate PS mode, severe resp failure End stage COPD 5/5 severe resp distress, can not tolerate TCT, severe end stage COPD 5/7 sitting up in chair, able to do some pressure support trials but no trach collar trials. Still appears very tachypneic 5/8 cannot tolerate trach collar, few hours of PS only  CC  follow up acute on chronic respiratory failure  SUBJECTIVE Very Severe COPD Severe resp failure +air trapping Flattened diaphragms consistent with end-stage COPD  BP 121/90   Pulse (!) 109   Temp 98 F (36.7 C) (Oral)   Resp 18   Ht 5\' 6"  (1.676 m)   Wt 60.3 kg   SpO2 99%   BMI 21.46 kg/m    I/O last 3 completed shifts: In: 2995 [P.O.:60; Other:535; NG/GT:2310; IV Piggyback:90] Out: 926 [Urine:925; Stool:1] No intake/output data recorded.  SpO2: 99 % O2 Flow Rate (L/min): 2 L/min FiO2 (%):  28 %  Estimated body mass index is 21.46 kg/m as calculated from the following:   Height as of this encounter: 5\' 6"  (1.676 m).   Weight as of this encounter: 60.3 kg.  LIMITED ROS DUE SEVERE COPD  Pressure Injury 06/24/19 Buttocks Right Stage 3 -  Full thickness tissue loss. Subcutaneous fat may be visible  but bone, tendon or muscle are NOT exposed. (Active)  06/24/19 0200  Location: Buttocks  Location Orientation: Right  Staging: Stage 3 -  Full thickness tissue loss. Subcutaneous fat may be visible but bone, tendon or muscle are NOT exposed.  Wound Description (Comments):   Present on Admission: No      PHYSICAL EXAMINATION:  GENERAL: Very thin male, temporal wasting noted, sitting up in chair, trach in place.  Using accessories of respiration even on vent. HEAD: Normocephalic, atraumatic.  Bitemporal wasting. EYES: Pupils  equal, round, reactive to light. No scleral icterus.  MOUTH: Oral mucosa moist. NECK: Supple. No thyromegaly. JVD present. Trachea midline, tracheostomy in place. PULMONARY: Very distant breath sounds with poor air movement.   Use of accessories even on the vent.  No other adventitious sounds.   CARDIOVASCULAR: S1 and S2. Regular rate and rhythm. No murmurs heard. GASTROINTESTINAL: Abdomen is soft, nondistended, normoactive bowel sounds. MUSCULOSKELETAL: No joint deformity,no edema, + clubbing about the fingers.  NEUROLOGIC:  Awake, alert, nods appropriately.  Follows commands.  No overt focal deficits. SKIN: Intact,warm,dry. No rashes noted. PSYCH: Mood seems to be appropriate  MEDICATIONS: I have reviewed all medications and confirmed regimen as documented   CULTURE RESULTS   No results found for this or any previous visit (from the past 240 hour(s)).        IMAGING    No results found.   Nutrition Status: Nutrition Problem: Severe Malnutrition Etiology: chronic illness(COPD) Signs/Symptoms: severe fat depletion, severe muscle depletion Interventions: Refer to RD note for recommendations     Indwelling Urinary Catheter  no Foley   Reason to continue Indwelling Urinary Catheter    Central Line/  continued, requirement due to  Reason to continue 06/26/19  poor IV access.  PICC line, right brachial   Ventilator continued, requirement  due to severe respiratory failure      ASSESSMENT AND PLAN SYNOPSIS   Acute on chronic hypercarbic/hypoxic respiratory failure COPD exacerbation Patient is end-stage disease Likely will be ventilator dependent and unable to wean Continue ventilator support Continue trials with pressure support as tolerates Has not tolerated trach collar trials Will  be ventilator dependent for life Bronchodilators: DuoNeb every 4h and as needed IV corticosteroids, weaning There continues to be no indication for antibiotics at present  Severe COPD stage IV with exacerbation Noted noncompliance with oxygen/BiPAP at home Bronchodilators as above IV corticosteroids Exacerbation seems to have subsided Patient is DNR He will remain ventilator dependent for life Making arrangements for home with ventilator support  Acute encephalopathy Unresponsiveness  RESOLVED  Due to hypercarbia Currently interactive PT/OT  Cor pulmonale with decompensation Pulmonary hypertension Management of issues as above Poor prognostic sign No diastolic dysfunction by recent echo Chronic right ventricular failure  Hypoglycemia Due to poor nutritional status/catabolic state (pulmonary cachexia) Continue tube feeds, requiring increased amount of calories Weaned off D10 Wean off steroids as tolerated  Poor prognostic sign  Acute kidney injury Avoid nephrotoxins Stable with no worsening Trend renal panel  Hyperkalemia Likely triggered by acidosis RESOLVED K  3.9 today  ID No indication for antibiotics at present Continue to trend WBC/fever  curve  Prophylaxis: Lovenox Famotidine  NUTRITIONAL STATUS Nutrition Status: Nutrition Problem: Severe Malnutrition Etiology: chronic illness(COPD) Signs/Symptoms: severe fat depletion, severe muscle depletion Interventions: Refer to RD note for recommendations   Multidisciplinary rounds were performed with ICU team today.  Critical Care Time  devoted to patient care services described in this note is 35 minutes.   Overall, patient is chronically critically ill, prognosis is guarded, long-term prognosis poor.     Renold Don, MD [facility]    *This note was dictated using voice recognition software/Dragon.  Despite best efforts to proofread, errors can occur which can change the meaning.  Any change was purely unintentional.

## 2019-07-10 NOTE — Progress Notes (Signed)
Pt 's sats started decreasing to 80's, I entered room, suctioned patient large amount of thick yellow sputum, sats continued to decreased quickly to the 30's. I bagged patient for 2 minutes and satsa returned to 100.RT present and managed vent settings. Pt put back on pressure control . Now resting in chair. Pt given 2 mg morphine during this to assist relaxing his respiratory muscles.   Melodye Ped

## 2019-07-10 NOTE — Progress Notes (Signed)
PHARMACY CONSULT NOTE  Pharmacy Consult for Electrolyte Monitoring and Replacement   Recent Labs: Potassium (mmol/L)  Date Value  07/10/2019 3.9  09/24/2013 4.6   Magnesium (mg/dL)  Date Value  81/82/9937 1.9   Calcium (mg/dL)  Date Value  16/96/7893 9.0   Calcium, Total (mg/dL)  Date Value  81/03/7508 8.9   Albumin (g/dL)  Date Value  25/85/2778 3.5   Phosphorus (mg/dL)  Date Value  24/23/5361 4.8 (H)   Sodium (mmol/L)  Date Value  07/10/2019 136  09/24/2013 138    Assessment: 49 year old male admitted after being found unresponsive and admitted with end stage COPD. Patient was intubated in the ED and transferred to the ICU. Patient started on tube feeds and at refeeding risk. He remains intubated and sedated following a failed Trach Collar this morning  Electrolytes: Goal: potassium 4.0 - 5.1 mmol/L, magnesium 2.0 - 2.4 mg/dL, all other electrolytes WNL   K: 3.9, Mg: 1.9, Scr: 0.38. Will order Mg 1g IV and  And KCL per tube X 1 dose.    F/U Electrolytes with AM labs 5/8  Glucose: Hydrocortisone 50mg  IV Q12hr. Steroid day  #6 w/ stop date of 7 days  BG<180: no intervention required  Continue to follow glucose q6h  Constipation: LBM 5/7 and  No intervention required  Will continue to monitor.   Pharmacy will continue to monitor and adjust per consult.   7/7, PharmD, BCPS Clinical Pharmacist 07/10/2019 7:48 AM

## 2019-07-10 NOTE — Progress Notes (Signed)
Pt assisted to bsc at 1300. After having a bowel movement,pt was breathing 60 times a minute, sats holding at 90, but extreme work of breathing. Pt unable to stand to get to recliner. After 10 minutes, patient unchanged , administered 2 mg morphine and 1 mg ativan. Pt now resting in recliner.Chris Case

## 2019-07-11 ENCOUNTER — Inpatient Hospital Stay: Payer: Medicaid Other

## 2019-07-11 LAB — GLUCOSE, CAPILLARY
Glucose-Capillary: 100 mg/dL — ABNORMAL HIGH (ref 70–99)
Glucose-Capillary: 101 mg/dL — ABNORMAL HIGH (ref 70–99)
Glucose-Capillary: 110 mg/dL — ABNORMAL HIGH (ref 70–99)
Glucose-Capillary: 85 mg/dL (ref 70–99)
Glucose-Capillary: 92 mg/dL (ref 70–99)

## 2019-07-11 LAB — CBC WITH DIFFERENTIAL/PLATELET
Abs Immature Granulocytes: 0.19 10*3/uL — ABNORMAL HIGH (ref 0.00–0.07)
Basophils Absolute: 0 10*3/uL (ref 0.0–0.1)
Basophils Relative: 0 %
Eosinophils Absolute: 0.1 10*3/uL (ref 0.0–0.5)
Eosinophils Relative: 1 %
HCT: 27.5 % — ABNORMAL LOW (ref 39.0–52.0)
Hemoglobin: 8.4 g/dL — ABNORMAL LOW (ref 13.0–17.0)
Immature Granulocytes: 2 %
Lymphocytes Relative: 11 %
Lymphs Abs: 1 10*3/uL (ref 0.7–4.0)
MCH: 30.5 pg (ref 26.0–34.0)
MCHC: 30.5 g/dL (ref 30.0–36.0)
MCV: 100 fL (ref 80.0–100.0)
Monocytes Absolute: 0.8 10*3/uL (ref 0.1–1.0)
Monocytes Relative: 9 %
Neutro Abs: 6.9 10*3/uL (ref 1.7–7.7)
Neutrophils Relative %: 77 %
Platelets: 130 10*3/uL — ABNORMAL LOW (ref 150–400)
RBC: 2.75 MIL/uL — ABNORMAL LOW (ref 4.22–5.81)
RDW: 14.1 % (ref 11.5–15.5)
WBC: 9.1 10*3/uL (ref 4.0–10.5)
nRBC: 0 % (ref 0.0–0.2)

## 2019-07-11 LAB — BASIC METABOLIC PANEL
Anion gap: 7 (ref 5–15)
BUN: 22 mg/dL — ABNORMAL HIGH (ref 6–20)
CO2: 38 mmol/L — ABNORMAL HIGH (ref 22–32)
Calcium: 8.7 mg/dL — ABNORMAL LOW (ref 8.9–10.3)
Chloride: 92 mmol/L — ABNORMAL LOW (ref 98–111)
Creatinine, Ser: 0.36 mg/dL — ABNORMAL LOW (ref 0.61–1.24)
GFR calc Af Amer: >60 mL/min (ref >60)
GFR calc non Af Amer: >60 mL/min (ref >60)
Glucose, Bld: 86 mg/dL (ref 70–99)
Potassium: 4.3 mmol/L (ref 3.5–5.1)
Sodium: 137 mmol/L (ref 135–145)

## 2019-07-11 LAB — MAGNESIUM: Magnesium: 2.1 mg/dL (ref 1.7–2.4)

## 2019-07-11 LAB — PHOSPHORUS: Phosphorus: 5.5 mg/dL — ABNORMAL HIGH (ref 2.5–4.6)

## 2019-07-11 MED ORDER — OSMOLITE 1.5 CAL PO LIQD
1000.0000 mL | ORAL | Status: DC
  Administered 2019-07-11: 1000 mL

## 2019-07-11 MED ORDER — ACETAZOLAMIDE 250 MG PO TABS
250.0000 mg | ORAL_TABLET | Freq: Two times a day (BID) | ORAL | Status: AC
  Administered 2019-07-11 (×2): 250 mg via ORAL
  Filled 2019-07-11 (×4): qty 1

## 2019-07-11 NOTE — Progress Notes (Signed)
Decreased tube feed rate to 40 ml/hr due to pt abdominal distension and complaints of abdominal pain.  Will order stat KUB.  Sonda Rumble, AGNP  Pulmonary/Critical Care Pager [phone] (please enter 7 digits) PCCM Consult Pager [phone] (please enter 7 digits)

## 2019-07-11 NOTE — Progress Notes (Signed)
Patient experiencing increased work of breathing with accessory muscle use and desaturation episode. Pressure Control and FIO2 increased. Patient SATs increased and work of breathing has returned to normal. Patient resting comfortably in bed at this time. Will wean setting back down as tolerated.

## 2019-07-11 NOTE — Progress Notes (Signed)
PHARMACY CONSULT NOTE  Pharmacy Consult for Electrolyte Monitoring and Replacement   Recent Labs: Potassium (mmol/L)  Date Value  07/11/2019 4.3  09/24/2013 4.6   Magnesium (mg/dL)  Date Value  60/60/0459 2.1   Calcium (mg/dL)  Date Value  97/74/1423 8.7 (L)   Calcium, Total (mg/dL)  Date Value  95/32/0233 8.9   Albumin (g/dL)  Date Value  43/56/8616 3.5   Phosphorus (mg/dL)  Date Value  83/72/9021 5.5 (H)   Sodium (mmol/L)  Date Value  07/11/2019 137  09/24/2013 138    Assessment: 49 year old male admitted after being found unresponsive and admitted with end stage COPD. Patient was intubated in the ED and transferred to the ICU. Patient started on tube feeds and at refeeding risk. He remains intubated and sedated following a failed Trach Collar this morning  Electrolytes: Goal: potassium 4.0 - 5.1 mmol/L, magnesium 2.0 - 2.4 mg/dL, all other electrolytes WNL   No need for replacement today.   F/U Electrolytes with AM labs  Glucose: Hydrocortisone 50mg  IV Q12hr. Last dose today  BG<180: no intervention required  Continue to follow glucose q6h  Constipation: LBM 5/7 and  No intervention required currently. Follow up tomorrow.   Pharmacy will continue to monitor and adjust per consult.   7/7, PharmD, BCPS Clinical Pharmacist 07/11/2019 7:49 AM

## 2019-07-11 NOTE — Progress Notes (Addendum)
CRITICAL CARE NOTE 49 year old former smoker with a history very severe COPD stage IV, who presented via EMS after being found down and unresponsive. Apparently the patient's mother placed a call to EMS. The patient has a longstanding history of COPD and noncompliance with his oxygen and CPAP. Multiple admission for COPD Of note he had recently been admitted to [facility] 29 May 2019 through 03 June 2019. He had been evaluated by palliative care during this admission and actually had a DNR order.  Patient also had a 2D echo  with pulmonary hypertension consistent with cor pulmonale.  Records review: Patient previously followed with Dr. Ned Clines at [facility].  Last known PFTs 11 September 2017.  FEV1 0.52 L or 20% predicted, FVC 1.49 L or 45% of predicted, FEV1/FVC 35%.  FEF 25-75 6%.  DLCO was 33%.  This is consistent with very severe COPD.  Events: 4/13 admitted for severe resp failure from COPD 4/14 remains critically ill on vent 4/15 remains critically ill, severe COPD, failed SAT/SBT 4/16 extubated yesterday, had to be reintubated remains on ventilator 4/17 sedated on the ventilator 4/18 abdominal distention, CT abdomen and pelvis consistent with ileus and constipation 4/19-patietn remains on ventialtor awainting trach 4/20 - patient is on vent with trach, he had episodes of aggitation today but eventually improved  4/21 - patient with significant distension of abdomen, concern for aerophagia in NIV.  There is mucopurulent exudation from trache this was cultures and patient placed on Unasyn for tracheitis 06/24/19 -trache site improved with less malodorous pus draining, NGT with decompression worked well abdomen is less distended. S/p PEG 06/25/19- patient had some anxiety throughout day today, we have started low dose anxiolytic. His PEG is now being utilized. He has some placement issues with social work due to insurance and we are working on this now.  06/26/19-Patient had no events  today, we are waiting for placement now from transitional care team 06/27/19- Patient is calm this morining, abdomen is mildy distended, awaiting placement to [facility] 4/26 plan for PS/TCT 4/27 Pt failed trach collar twice today. Became visibly distressed, diaphoretic, o2 drops, and accessory muscle use.  4/28increased WOB, resp distress, using accessory muscles to breath while on vent 4/29 patient placed on PS mode, given BD therapy, alert and awake, was able to communicate with PM valve 07/02/19 -trache changed to cuffed size 6 without incident, patient reports more comfort with improved vocalization.  07/03/19- no overnight events, patient sitting up in bed in no distress.  5/3 severe hypoxia and increased WOB, placed back on full vent support 5/4 severe resp distress, can NOT tolerate PS mode, severe resp failure End stage COPD 5/5 severe resp distress, can not tolerate TCT, severe end stage COPD 5/7 sitting up in chair, able to do some pressure support trials but no trach collar trials. Still appears very tachypneic 5/8 cannot tolerate trach collar, few hours of PS only 5/9 remains vent dependent.  Minimal progress with weaning  CC  follow up acute on chronic respiratory failure  SUBJECTIVE Very Severe COPD Severe resp failure +air trapping Flattened diaphragms consistent with end-stage COPD  BP 105/71   Pulse 83   Temp 98.4 F (36.9 C) (Oral)   Resp (!) 7   Ht 5\' 6"  (1.676 m)   Wt 62.5 kg   SpO2 95%   BMI 22.24 kg/m    I/O last 3 completed shifts: In: 1170 [P.O.:60; I.V.:10; Other:200; NG/GT:810; IV Piggyback:90] Out: 2001 [Urine:2000; Stool:1] No intake/output data recorded.  SpO2:  95 % O2 Flow Rate (L/min): 2 L/min FiO2 (%): 30 %  Estimated body mass index is 22.24 kg/m as calculated from the following:   Height as of this encounter: 5\' 6"  (1.676 m).   Weight as of this encounter: 62.5 kg.  LIMITED ROS DUE SEVERE COPD  Pressure Injury 06/24/19 Buttocks Right  Stage 3 -  Full thickness tissue loss. Subcutaneous fat may be visible but bone, tendon or muscle are NOT exposed. (Active)  06/24/19 0200  Location: Buttocks  Location Orientation: Right  Staging: Stage 3 -  Full thickness tissue loss. Subcutaneous fat may be visible but bone, tendon or muscle are NOT exposed.  Wound Description (Comments):   Present on Admission: No      PHYSICAL EXAMINATION:  GENERAL: Tthin male,  sitting up in chair, trach in place.  Using accessories of respiration even on vent. HEAD: Normocephalic, atraumatic.  EYES: Pupils  equal, round, reactive to light. No scleral icterus.  MOUTH: Oral mucosa moist. NECK: Supple. No thyromegaly. JVD present. Trachea midline, tracheostomy in place.  Dressing clean and intact. PULMONARY: Very distant breath sounds with poor air movement.   Use of accessories even on the vent.  No other adventitious sounds.   CARDIOVASCULAR: S1 and S2. Regular rate and rhythm. No murmurs heard. GASTROINTESTINAL: Abdomen is soft, nondistended, normoactive bowel sounds. MUSCULOSKELETAL: No joint deformity,no edema, + clubbing about the fingers.  NEUROLOGIC:  Awake, alert, nods appropriately.  Follows commands.  No overt focal deficits. SKIN: Intact,warm,dry. No rashes noted. PSYCH: Mood seems to be appropriate  MEDICATIONS: I have reviewed all medications and confirmed regimen as documented   CULTURE RESULTS   No results found for this or any previous visit (from the past 240 hour(s)).        IMAGING    DG Abd 1 View  Result Date: 07/11/2019 CLINICAL DATA:  49 year old male with abdominal pain. EXAM: ABDOMEN - 1 VIEW COMPARISON:  Abdominal radiograph dated 06/24/2019. FINDINGS: There is air throughout the small bowel and colon. No bowel dilatation or evidence of obstruction. No free air. The osseous structures and soft tissues are grossly unremarkable. IMPRESSION: No evidence of obstruction. Electronically Signed   By: Anner Crete M.D.   On: 07/11/2019 20:34     Nutrition Status: Nutrition Problem: Severe Malnutrition Etiology: chronic illness(COPD) Signs/Symptoms: severe fat depletion, severe muscle depletion Interventions: Refer to RD note for recommendations     Indwelling Urinary Catheter  no Foley   Reason to continue Indwelling Urinary Catheter    Central Line/  continued, requirement due to  Reason to continue [employer]  poor IV access.  PICC line, right brachial   Ventilator continued, requirement due to severe respiratory failure      ASSESSMENT AND PLAN SYNOPSIS   Acute on chronic hypercarbic/hypoxic respiratory failure COPD exacerbation Patient is end-stage disease Likely will be ventilator dependent and unable to wean Continue ventilator support Continue trials with pressure support as tolerates Has not tolerated trach collar trials Will  be ventilator dependent for life Bronchodilators: DuoNeb every 4h and as needed IV corticosteroids, d/cd There continues to be no indication for antibiotics at present Trying to obtain Passy-Muir valve that can be used with the vent.  Severe COPD stage IV with exacerbation Noted noncompliance with oxygen/BiPAP at home Bronchodilators as above IV corticosteroids- d/cd (tapered off) Exacerbation seems to have subsided Patient is DNR He will remain ventilator dependent for life Making arrangements for home with ventilator support  Acute encephalopathy Unresponsiveness  RESOLVED  Due to hypercarbia Currently interactive PT/OT  Cor pulmonale with decompensation Pulmonary hypertension Management of issues as above Poor prognostic sign No diastolic dysfunction by recent echo Chronic right ventricular failure  Hypoglycemia Due to poor nutritional status/catabolic state (pulmonary cachexia) Continue tube feeds, requiring increased amount of calories Weaned off D10 Wean off steroids as tolerated  Poor prognostic  sign  Acute kidney injury Avoid nephrotoxins Stable with no worsening Trend renal panel   Hyperkalemia Likely triggered by acidosis RESOLVED K 4.3 today  ID No indication for antibiotics at present Continue to trend WBC/fever curve  Prophylaxis: Lovenox Famotidine  NUTRITIONAL STATUS Nutrition Status: Nutrition Problem: Severe Malnutrition Etiology: chronic illness(COPD) Signs/Symptoms: severe fat depletion, severe muscle depletion Interventions: Refer to RD note for recommendations   Critical Care Time devoted to patient care services described in this note is 35 minutes.   Overall, patient is chronically critically ill, prognosis is guarded, long-term prognosis poor.    Updated patient and significant other at bedside.   Gailen Shelter, MD [facility]    *This note was dictated using voice recognition software/Dragon.  Despite best efforts to proofread, errors can occur which can change the meaning.  Any change was purely unintentional.

## 2019-07-12 DIAGNOSIS — J9602 Acute respiratory failure with hypercapnia: Secondary | ICD-10-CM

## 2019-07-12 LAB — BASIC METABOLIC PANEL
Anion gap: 7 (ref 5–15)
BUN: 26 mg/dL — ABNORMAL HIGH (ref 6–20)
CO2: 34 mmol/L — ABNORMAL HIGH (ref 22–32)
Calcium: 8.9 mg/dL (ref 8.9–10.3)
Chloride: 96 mmol/L — ABNORMAL LOW (ref 98–111)
Creatinine, Ser: 0.35 mg/dL — ABNORMAL LOW (ref 0.61–1.24)
GFR calc Af Amer: >60 mL/min (ref >60)
GFR calc non Af Amer: >60 mL/min (ref >60)
Glucose, Bld: 84 mg/dL (ref 70–99)
Potassium: 3.9 mmol/L (ref 3.5–5.1)
Sodium: 137 mmol/L (ref 135–145)

## 2019-07-12 LAB — CBC WITH DIFFERENTIAL/PLATELET
Abs Immature Granulocytes: 0.22 10*3/uL — ABNORMAL HIGH (ref 0.00–0.07)
Basophils Absolute: 0 10*3/uL (ref 0.0–0.1)
Basophils Relative: 0 %
Eosinophils Absolute: 0.3 10*3/uL (ref 0.0–0.5)
Eosinophils Relative: 3 %
HCT: 30.2 % — ABNORMAL LOW (ref 39.0–52.0)
Hemoglobin: 9.1 g/dL — ABNORMAL LOW (ref 13.0–17.0)
Immature Granulocytes: 2 %
Lymphocytes Relative: 16 %
Lymphs Abs: 1.4 10*3/uL (ref 0.7–4.0)
MCH: 30.5 pg (ref 26.0–34.0)
MCHC: 30.1 g/dL (ref 30.0–36.0)
MCV: 101.3 fL — ABNORMAL HIGH (ref 80.0–100.0)
Monocytes Absolute: 1 10*3/uL (ref 0.1–1.0)
Monocytes Relative: 11 %
Neutro Abs: 6.2 10*3/uL (ref 1.7–7.7)
Neutrophils Relative %: 68 %
Platelets: 122 10*3/uL — ABNORMAL LOW (ref 150–400)
RBC: 2.98 MIL/uL — ABNORMAL LOW (ref 4.22–5.81)
RDW: 14.3 % (ref 11.5–15.5)
WBC: 9.1 10*3/uL (ref 4.0–10.5)
nRBC: 0 % (ref 0.0–0.2)

## 2019-07-12 LAB — GLUCOSE, CAPILLARY
Glucose-Capillary: 80 mg/dL (ref 70–99)
Glucose-Capillary: 87 mg/dL (ref 70–99)
Glucose-Capillary: 75 mg/dL (ref 70–99)
Glucose-Capillary: 75 mg/dL (ref 70–99)

## 2019-07-12 LAB — MAGNESIUM: Magnesium: 2.1 mg/dL (ref 1.7–2.4)

## 2019-07-12 LAB — PHOSPHORUS: Phosphorus: 5.9 mg/dL — ABNORMAL HIGH (ref 2.5–4.6)

## 2019-07-12 MED ORDER — MIDAZOLAM HCL 2 MG/2ML IJ SOLN
INTRAMUSCULAR | Status: AC
  Filled 2019-07-12: qty 4

## 2019-07-12 MED ORDER — MIDAZOLAM HCL 2 MG/2ML IJ SOLN
2.0000 mg | Freq: Once | INTRAMUSCULAR | Status: AC
  Administered 2019-07-12: 2 mg via INTRAVENOUS

## 2019-07-12 MED ORDER — MIDAZOLAM HCL 2 MG/2ML IJ SOLN
4.0000 mg | Freq: Once | INTRAMUSCULAR | Status: AC
  Administered 2019-07-12: 4 mg via INTRAVENOUS

## 2019-07-12 MED ORDER — FENTANYL CITRATE (PF) 100 MCG/2ML IJ SOLN
100.0000 ug | Freq: Once | INTRAMUSCULAR | Status: AC
  Administered 2019-07-12: 100 ug via INTRAVENOUS

## 2019-07-12 MED ORDER — FENTANYL CITRATE (PF) 100 MCG/2ML IJ SOLN
INTRAMUSCULAR | Status: AC
  Filled 2019-07-12: qty 2

## 2019-07-12 MED ORDER — MIDAZOLAM HCL 2 MG/2ML IJ SOLN
INTRAMUSCULAR | Status: AC
  Filled 2019-07-12: qty 2

## 2019-07-12 NOTE — TOC Progression Note (Signed)
Transition of Care [facility]) - Progression Note    Patient Details  Name: Chris Case MRN: [id number] Date of Birth: 07-10-1970  [facility] of [facility]) CM/SW Contact  Liliana Cline, LCSW Phone Number: 07/12/2019, 11:38 AM  Clinical Narrative:   CSW updated [employer] Representative [name] that trach training has been done with roommate, [name]. Inquired about when [employer] can do their training portion. Updated [name] of needs prior to discharge: trach supplies, tube feed supplies, 3 in 1, wheelchair with cushion, hospital bed, and RW. Informed [name] that per rounds/MD, patient had a decline today (patient often has ups and downs). Will continue with planning for home with DME/HH services so that a plan is in place when patient is medically ready for discharge. CSW will continue to follow.    Expected Discharge Plan: Home w Home Health Services Barriers to Discharge: Continued Medical Work up  Expected Discharge Plan and Services Expected Discharge Plan: Home w Home Health Services     Post Acute Care Choice: Home Health Living arrangements for the past 2 months: Single Family Home                             HH Agency: [facility]     Representative spoke with at [facility] Agency: Feliberto Gottron   Social Determinants of Health (SDOH) Interventions    Readmission Risk Interventions Readmission Risk Prevention Plan 06/29/2019  Transportation Screening Complete  PCP or Specialist Appt within 3-5 Days Complete  HRI or Home Care Consult Complete  Palliative Care Screening Complete  Medication Review (RN Care Manager) Complete  Some recent data might be hidden

## 2019-07-12 NOTE — Progress Notes (Signed)
SLP Cancellation Note  Patient Details Name: Chris Case MRN: [id number] DOB: [birth year]   Cancelled treatment:       Reason Eval/Treat Not Completed: (chart reviewed; consulted NSG and RT). Currently, pt is on full vent support; noted increased CO2. He is unable to wean to trach collar at this time for PMV wear, use. Per RT, vent adjustments are being made to address CO2 and to assist pt's weaning but at this time, he is not able to tolerate less vent support. Using an Inline PMV, requires a pt to be able to tolerate a little less vent support -- the cuff MUST be deflated during Inline wear, use. Safe, comfortable PMV wear, use is recommended when pt is able to tolerate ~4+ hours w/out vent support on trach collar w/out event. Explained the need for Pulmonary stamina and strength for PMV wear, use. I do not believe pt is at that place in his wean at this time. ST services can be available for further needs/education. NSG, RT updated.  Pt has a communication board in room for writing to communicate(dry erase bd). Pt is able to mouth and use gestures as well. No apparent language deficits noted. Recommend frequent oral care for hygiene and stimulation of swallowing.     Jerilynn Som, MS, CCC-SLP [facility] 07/12/2019, 9:19 AM

## 2019-07-12 NOTE — Progress Notes (Signed)
PT Cancellation Note  Patient Details Name: Chris Case MRN: [id number] DOB: 1970-06-17   Cancelled Treatment:    Reason Eval/Treat Not Completed: Medical issues which prohibited therapy.  Chart reviewed.  Per ICU nurse, pt has had respiratory issues today (on the vent).  D/t respiratory concerns, will hold PT at this time and re-attempt PT treatment at a later date/time as medically appropriate.  Hendricks Limes, PT 07/12/19, 2:48 PM

## 2019-07-12 NOTE — Progress Notes (Signed)
Patient is tachypneic and use of accessory muscles present at this time. Respiratory present to adjust vent settings. Pt given lorazepam 1mg  IV and Morphine as prn orders for anxiety and SOB. Dr notified of change in patient condition. Orders received for Chris Case of fentanyl and 4 mg of versed x1 now. Will continue to monitor patient closely and report any and all changes to Physician as needed.

## 2019-07-12 NOTE — Progress Notes (Signed)
PHARMACY CONSULT NOTE  Pharmacy Consult for Electrolyte Monitoring and Replacement   Recent Labs: Potassium (mmol/L)  Date Value  07/12/2019 3.9  09/24/2013 4.6   Magnesium (mg/dL)  Date Value  42/59/5638 2.1   Calcium (mg/dL)  Date Value  75/64/3329 8.9   Calcium, Total (mg/dL)  Date Value  51/88/4166 8.9   Albumin (g/dL)  Date Value  09/01/1599 3.5   Phosphorus (mg/dL)  Date Value  09/32/3557 5.9 (H)   Sodium (mmol/L)  Date Value  07/12/2019 137  09/24/2013 138    Assessment: 49 year old male admitted after being found unresponsive and admitted with end stage COPD. Patient was intubated in the ED and transferred to the ICU. Patient with trach and G-tube.  Electrolytes: Goal: potassium 4.0 - 5.1 mmol/L, magnesium 2.0 - 2.4 mg/dL, all other electrolytes WNL   No electrolyte replacement indicated at this time. Labs are still ordered daily. Will follow along with team.  Glucose: Following CBGs q4h. Patient was able to come off of D10 infusion, but tube feeds are to be held today. Patient may require D10 again while nutrition held. Plan for nurse to continue q4h CBG checks and treat with dextrose as indicated.  Constipation: LBM 5/8. Tube feeds to be held for 24hr for significant distention, concern for possible air. Will defer adding anything at this time.  Pharmacy will continue to monitor and adjust per consult.   Pricilla Riffle, PharmD Clinical Pharmacist 07/12/2019 10:37 AM

## 2019-07-12 NOTE — Progress Notes (Signed)
Per MD order. Tube feeds and meds via tube on hold until 07/13/19. Will revisit this issue with MD and Dietician tomorrow.

## 2019-07-12 NOTE — Progress Notes (Signed)
CRITICAL CARE NOTE 49 year old former smoker with a history very severe COPD stage IV, who presented via EMS after being found down and unresponsive. Apparently the patient's mother placed a call to EMS. The patient has a longstanding history of COPD and noncompliance with his oxygen and CPAP. Multiple admission for COPD Of note he had recently been admitted to [facility] 29 May 2019 through 03 June 2019. He had been evaluated by palliative care during this admission and actually had a DNR order. Patient also had a 2D echo with pulmonary hypertension consistent with cor pulmonale.  Records review: Patient previously followed with Dr. Wallene Huh at [facility]. Last known PFTs 11 September 2017. FEV1 0.52 L or 20% predicted, FVC 1.49 L or 45% of predicted, FEV1/FVC 35%. FEF 25-75 6%. DLCO was 33%. This is consistent with very severe COPD.  Events: 4/13 admitted for severe resp failure from COPD 4/14 remains critically ill on vent 4/15 remains critically ill, severe COPD, failed SAT/SBT 4/16 extubated yesterday, had to be reintubated remains on ventilator 4/17 sedated on the ventilator 4/18 abdominal distention, CT abdomen and pelvis consistent with ileus and constipation 4/19-patietn remains on ventialtor awainting trach 4/20 - patient is on vent with trach, he had episodes of aggitation today but eventually improved  4/21 - patient with significant distension of abdomen, concern for aerophagia in NIV. There is mucopurulent exudation from trache this was cultures and patient placed on Unasyn for tracheitis 06/24/19 -trache site improved with less malodorous pus draining, NGT with decompression worked well abdomen is less distended. S/p PEG 06/25/19-patient had some anxiety throughout day today, we have started low dose anxiolytic. His PEG is now being utilized. He has some placement issues with social work due to insurance and we are working on this now.  06/26/19-Patient had no events  today, we are waiting for placement now from transitional care team 06/27/19- Patient is calm this morining, abdomen is mildy distended, awaiting placement to [facility] 4/26 plan for PS/TCT 4/27 Pt failed trach collar twice today. Became visibly distressed, diaphoretic, o2 drops, and accessory muscle use.  4/28increased WOB, resp distress, using accessory muscles to breath while on vent 4/29 patient placed on PS mode, given BD therapy, alert and awake, was able to communicate with PM valve 07/02/19-trache changed to cuffed size 6 without incident, patient reports more comfort with improved vocalization.  07/03/19- no overnight events, patient sitting up in bed in no distress.  5/3severe hypoxia and increased WOB, placed back on full vent support 5/4 severe resp distress, can NOT tolerate PS mode, severe resp failure End stage COPD 5/5 severe resp distress, can not tolerate TCT, severe end stage COPD 5/7 sitting up in chair, able to do some pressure support trials but no trach collar trials. Still appears very tachypneic 5/8 cannot tolerate trach collar, few hours of PS only 5/9 remains vent dependent.  Minimal progress with weaning CC  follow up respiratory failure  SUBJECTIVE Patient remains critically ill Prognosis is guarded Severe end stage COPD +air trapping  Increased WOB High risk for cardiac arrest    BP 110/82   Pulse 87   Temp 98.3 F (36.8 C) (Oral)   Resp 15   Ht '5\' 6"'  (1.676 m)   Wt 60.3 kg   SpO2 100%   BMI 21.46 kg/m    I/O last 3 completed shifts: In: 10 [I.V.:10] Out: 2625 [Urine:2625] Total I/O In: -  Out: 700 [Urine:700]  SpO2: 100 % O2 Flow Rate (L/min): 2 L/min FiO2 (%):  30 %  Estimated body mass index is 21.46 kg/m as calculated from the following:   Height as of this encounter: '5\' 6"'  (1.676 m).   Weight as of this encounter: 60.3 kg.  SIGNIFICANT EVENTS   REVIEW OF SYSTEMS  PATIENT IS UNABLE TO PROVIDE COMPLETE REVIEW OF SYSTEMS DUE TO  SEVERE CRITICAL ILLNESS   Pressure Injury 06/24/19 Buttocks Right Stage 3 -  Full thickness tissue loss. Subcutaneous fat may be visible but bone, tendon or muscle are NOT exposed. (Active)  06/24/19 0200  Location: Buttocks  Location Orientation: Right  Staging: Stage 3 -  Full thickness tissue loss. Subcutaneous fat may be visible but bone, tendon or muscle are NOT exposed.  Wound Description (Comments):   Present on Admission: No      PHYSICAL EXAMINATION:  GENERAL:critically ill appearing, +resp distress HEAD: Normocephalic, atraumatic.  EYES: Pupils equal, round, reactive to light.  No scleral icterus.  MOUTH: Moist mucosal membrane. NECK: Supple.  PULMONARY: +rhonchi, +wheezing CARDIOVASCULAR: S1 and S2. Regular rate and rhythm. No murmurs, rubs, or gallops.  GASTROINTESTINAL: Soft, nontender, -distended.  Positive bowel sounds.   MUSCULOSKELETAL: No swelling, clubbing, or edema.  NEUROLOGIC: obtunded, GCS<8 SKIN:intact,warm,dry  MEDICATIONS: I have reviewed all medications and confirmed regimen as documented   CULTURE RESULTS   No results found for this or any previous visit (from the past 240 hour(s)).        IMAGING    DG Abd 1 View  Result Date: 07/11/2019 CLINICAL DATA:  49 year old male with abdominal pain. EXAM: ABDOMEN - 1 VIEW COMPARISON:  Abdominal radiograph dated 06/24/2019. FINDINGS: There is air throughout the small bowel and colon. No bowel dilatation or evidence of obstruction. No free air. The osseous structures and soft tissues are grossly unremarkable. IMPRESSION: No evidence of obstruction. Electronically Signed   By: Anner Crete M.D.   On: 07/11/2019 20:34     Nutrition Status: Nutrition Problem: Severe Malnutrition Etiology: chronic illness(COPD) Signs/Symptoms: severe fat depletion, severe muscle depletion Interventions: Refer to RD note for recommendations     Indwelling Urinary Catheter continued, requirement due to   Reason  to continue Indwelling Urinary Catheter strict Intake/Output monitoring for hemodynamic instability   Central Line/ continued, requirement due to  Reason to continue [place] of central venous pressure or other hemodynamic parameters and poor IV access   Ventilator continued, requirement due to severe respiratory failure       ASSESSMENT AND PLAN SYNOPSIS   Severe ACUTE Hypoxic and Hypercapnic Respiratory Failure from end stage COPD and severe Vent dependant  resp failure -continue Full MV support -continue Bronchodilator Therapy -Wean Fio2 and PEEP as tolerated -will perform SAT/SBT when respiratory parameters are met -VAP/VENT bundle implementation Unable to wean  Vent settings adjusted several times Has not tolerated trach collar trials Will  be ventilator dependent for life   ACUTE COR PULMONALE - PULM HTN Vent dependant Consider lasix therapy Very poor prognosis   NEUROLOGY - intubated and sedated - minimal sedation to achieve a RASS goal: -1   CARDIAC ICU monitoring  ID -continue IV abx as prescibed -follow up cultures  GI GI PROPHYLAXIS as indicated  NUTRITIONAL STATUS Nutrition Status: Nutrition Problem: Severe Malnutrition Etiology: chronic illness(COPD) Signs/Symptoms: severe fat depletion, severe muscle depletion Interventions: Refer to RD note for recommendations   DIET-->NPO due to abd distension Constipation protocol as indicated  ENDO - will use ICU hypoglycemic\Hyperglycemia protocol if indicated   ELECTROLYTES -follow labs as needed -replace as needed -pharmacy  consultation and following   DVT/GI PRX ordered TRANSFUSIONS AS NEEDED MONITOR FSBS ASSESS the need for LABS as needed   RECOMMEND [place] Time devoted to patient care services described in this note is 45 minutes.   Overall, patient is critically ill, prognosis is guarded.  Patient with Multiorgan failure and at high risk  for cardiac arrest and death.    Corrin Parker, M.D.  [facility]  Medical Director [place] Director [facility] Cardio-Pulmonary Department

## 2019-07-13 LAB — CBC WITH DIFFERENTIAL/PLATELET
Abs Immature Granulocytes: 0.11 10*3/uL — ABNORMAL HIGH (ref 0.00–0.07)
Basophils Absolute: 0 10*3/uL (ref 0.0–0.1)
Basophils Relative: 0 %
Eosinophils Absolute: 0.4 10*3/uL (ref 0.0–0.5)
Eosinophils Relative: 4 %
HCT: 28.8 % — ABNORMAL LOW (ref 39.0–52.0)
Hemoglobin: 8.8 g/dL — ABNORMAL LOW (ref 13.0–17.0)
Immature Granulocytes: 1 %
Lymphocytes Relative: 8 %
Lymphs Abs: 0.8 10*3/uL (ref 0.7–4.0)
MCH: 30.6 pg (ref 26.0–34.0)
MCHC: 30.6 g/dL (ref 30.0–36.0)
MCV: 100 fL (ref 80.0–100.0)
Monocytes Absolute: 0.7 10*3/uL (ref 0.1–1.0)
Monocytes Relative: 7 %
Neutro Abs: 7.7 10*3/uL (ref 1.7–7.7)
Neutrophils Relative %: 80 %
Platelets: 118 10*3/uL — ABNORMAL LOW (ref 150–400)
RBC: 2.88 MIL/uL — ABNORMAL LOW (ref 4.22–5.81)
RDW: 14 % (ref 11.5–15.5)
WBC: 9.7 10*3/uL (ref 4.0–10.5)
nRBC: 0 % (ref 0.0–0.2)

## 2019-07-13 LAB — GLUCOSE, CAPILLARY
Glucose-Capillary: 126 mg/dL — ABNORMAL HIGH (ref 70–99)
Glucose-Capillary: 66 mg/dL — ABNORMAL LOW (ref 70–99)
Glucose-Capillary: 72 mg/dL (ref 70–99)
Glucose-Capillary: 72 mg/dL (ref 70–99)
Glucose-Capillary: 76 mg/dL (ref 70–99)
Glucose-Capillary: 78 mg/dL (ref 70–99)
Glucose-Capillary: 80 mg/dL (ref 70–99)

## 2019-07-13 LAB — BASIC METABOLIC PANEL
Anion gap: 8 (ref 5–15)
BUN: 27 mg/dL — ABNORMAL HIGH (ref 6–20)
CO2: 33 mmol/L — ABNORMAL HIGH (ref 22–32)
Calcium: 8.9 mg/dL (ref 8.9–10.3)
Chloride: 95 mmol/L — ABNORMAL LOW (ref 98–111)
Creatinine, Ser: 0.5 mg/dL — ABNORMAL LOW (ref 0.61–1.24)
GFR calc Af Amer: >60 mL/min (ref >60)
GFR calc non Af Amer: >60 mL/min (ref >60)
Glucose, Bld: 136 mg/dL — ABNORMAL HIGH (ref 70–99)
Potassium: 4.4 mmol/L (ref 3.5–5.1)
Sodium: 136 mmol/L (ref 135–145)

## 2019-07-13 LAB — PHOSPHORUS: Phosphorus: 4 mg/dL (ref 2.5–4.6)

## 2019-07-13 LAB — MAGNESIUM: Magnesium: 2 mg/dL (ref 1.7–2.4)

## 2019-07-13 MED ORDER — SENNOSIDES-DOCUSATE SODIUM 8.6-50 MG PO TABS
2.0000 | ORAL_TABLET | Freq: Two times a day (BID) | ORAL | Status: DC
  Administered 2019-07-13 (×2): 2
  Filled 2019-07-13 (×2): qty 2

## 2019-07-13 MED ORDER — ERYTHROMYCIN ETHYLSUCCINATE 200 MG/5ML PO SUSR
200.0000 mg | Freq: Four times a day (QID) | ORAL | Status: DC
  Filled 2019-07-13 (×2): qty 5

## 2019-07-13 MED ORDER — MIDAZOLAM HCL 2 MG/2ML IJ SOLN
2.0000 mg | Freq: Once | INTRAMUSCULAR | Status: AC
  Administered 2019-07-13: 2 mg via INTRAVENOUS

## 2019-07-13 MED ORDER — SODIUM CHLORIDE 0.9 % IV SOLN
250.0000 mg | Freq: Four times a day (QID) | INTRAVENOUS | Status: DC
  Administered 2019-07-14 – 2019-07-17 (×14): 250 mg via INTRAVENOUS
  Filled 2019-07-13 (×20): qty 5

## 2019-07-13 MED ORDER — DEXTROSE 50 % IV SOLN
INTRAVENOUS | Status: AC
  Filled 2019-07-13: qty 50

## 2019-07-13 MED ORDER — MIDAZOLAM HCL 2 MG/2ML IJ SOLN
INTRAMUSCULAR | Status: AC
  Filled 2019-07-13: qty 2

## 2019-07-13 NOTE — TOC Progression Note (Addendum)
Transition of Care [facility]) - Progression Note    Patient Details  Name: Chris Case MRN: [id number] Date of Birth: 06-Mar-1970  Transition of Care [facility]) CM/SW Contact  Liliana Cline, LCSW Phone Number: 07/13/2019, 11:18 AM  Clinical Narrative:   Updated [employer] Representative [name] and [facility] Representative Barbara Cower that prior to discharge they are working on plan (plan as of now if possible is patient to tolerate trach collar during day/vent at night, tolerate feeds- per rounds). Unsure how long this would take to achieve. Per rounds, Palliative consult to be ordered for discussion with patient and his roommate about options and what they would look like since there are concerns about his safety returning home. [facility], [employer], and [facility] will continue to follow for services if patient does discharge home as patient has reported he are his wishes when stable. CSW will continue to follow.     Expected Discharge Plan: Home w Home Health Services Barriers to Discharge: Continued Medical Work up  Expected Discharge Plan and Services Expected Discharge Plan: Home w Home Health Services     Post Acute Care Choice: Home Health Living arrangements for the past 2 months: Single Family Home                             HH Agency: [facility]     Representative spoke with at [facility] Agency: Feliberto Gottron   Social Determinants of Health (SDOH) Interventions    Readmission Risk Interventions Readmission Risk Prevention Plan 06/29/2019  Transportation Screening Complete  PCP or Specialist Appt within 3-5 Days Complete  HRI or Home Care Consult Complete  Palliative Care Screening Complete  Medication Review (RN Care Manager) Complete  Some recent data might be hidden

## 2019-07-13 NOTE — Progress Notes (Signed)
Nutrition Follow-up  DOCUMENTATION CODES:   Severe malnutrition in context of chronic illness  INTERVENTION:  Plan is to resume tube feeds tomorrow. Recommend initiating Osmolite 1.5 Cal at 15 mL/hr and advancing by 20 mL/hr every 8 hours to goal rate of 75 mL/hr (1800 mL goal daily volume). Provides 2700 kcal, 113 grams of protein, 366.5 grams of carbohydrates, 1368 mL H2O daily.  Goal TF regimen meets 100% RDIs for vitamins/minerals.  Resume Juven 1 packetper tubeBID. Each packet provides 95 kcal, 7 grams L-Arginine, 7 grams L-Glutamine, 2.5 grams collagen protein, 300 mg vitamin C, 9.5 mg zinc, and other micronutrients essential for wound healing.  Resume minimal free water flush of 30 mL Q4hrs to maintain tube patency.  NUTRITION DIAGNOSIS:   Severe Malnutrition related to chronic illness(COPD) as evidenced by severe fat depletion, severe muscle depletion.  Ongoing.  GOAL:   Patient will meet greater than or equal to 90% of their needs  Not met at this time.  MONITOR:   Labs, Weight trends, Vent status, TF tolerance, I & O's  REASON FOR ASSESSMENT:   Ventilator    ASSESSMENT:   49 year old male with PMHx of emphysema, COPD, hx TB admitted with acute COPD exacerbation requiring intubation.   -Patient s/p tracheostomy tube placement on 4/20. -Patient s/p robotic assisted laparoscopic G-tube placement with extensive lysis of adhesions on 4/22.  On 5/9 patient developed abdominal distention and pain. Tube feeds were decreased to 40 mL/hr. An abdominal x-ray was taken that showed air throughout the small bowel and colon. Feeds were then held. Discussed with RN and on rounds. Plan is to hold feeds for one more day and possibly resume feeds tomorrow. Per report patient's abdomen is much less distended now. Plan is also to start erythromycin again tomorrow.  Met with patient in room. RN also present. Patient reports he is feeling much better now. He reports when his abdomen  became distended it felt like he had gas the day his abdomen became distended. Discussed that plan is to resume tube feeds tomorrow.  Patient is currently intubated on ventilator support MV: 8.4 L/min Temp (24hrs), Avg:98 F (36.7 C), Min:97.7 F (36.5 C), Max:98.2 F (36.8 C)  Medications reviewed and include: famotidine, senna-docusate 2 tablets BID.  Labs reviewed: CBG 66-126, Chloride 95, CO2 33, BUN 27, Creatinine 0.5.  I/O: 2000 mL UOP yesterday (1.5 mL/kg/hr)  Enteral Access: G-tube  Weight trend: 55.9 kg on 5/11; +1.2 kg from 4/14  Diet Order:   Diet Order            Diet NPO time specified  Diet effective midnight             EDUCATION NEEDS:   No education needs have been identified at this time  Skin:  Skin Assessment: Skin Integrity Issues:(stage 3 buttocks (2cm x 3cm))  Last BM:  07/10/2019 per chart  Height:   Ht Readings from Last 1 Encounters:  06/15/19 '5\' 6"'  (1.676 m)   Weight:   Wt Readings from Last 1 Encounters:  07/13/19 55.9 kg   Ideal Body Weight:  64.5 kg  BMI:  Body mass index is 19.89 kg/m.  Estimated Nutritional Needs:   Kcal:  1900-2100  Protein:  85-100 grams  Fluid:  1.6-1.9 L/day  Jacklynn Barnacle, MS, RD, LDN Pager number available on Amion

## 2019-07-13 NOTE — Progress Notes (Addendum)
PHARMACY CONSULT NOTE  Pharmacy Consult for Electrolyte Monitoring and Replacement   Recent Labs: Potassium (mmol/L)  Date Value  07/13/2019 4.4  09/24/2013 4.6   Magnesium (mg/dL)  Date Value  41/96/2229 2.0   Calcium (mg/dL)  Date Value  79/89/2119 8.9   Calcium, Total (mg/dL)  Date Value  41/74/0814 8.9   Albumin (g/dL)  Date Value  48/18/5631 3.5   Phosphorus (mg/dL)  Date Value  49/70/2637 4.0   Sodium (mmol/L)  Date Value  07/13/2019 136  09/24/2013 138    Assessment: 49 year old male admitted after being found unresponsive and admitted with end stage COPD. Patient was intubated in the ED and transferred to the ICU. Patient with trach and G-tube.  Electrolytes: Goal: potassium 4.0 - 5.1 mmol/L, magnesium 2.0 - 2.4 mg/dL, all other electrolytes WNL   No electrolyte replacement indicated at this time. Labs are still ordered daily. Will follow along with team.  Glucose: Following CBGs q4h. Patient remains off D10 and tube feeds will be attempted again to determine tolerability. If tube feeds continue to be held, patient may require D5 or D10 again. Plan for nurse to continue q4h CBG checks and treat with dextrose as indicated.    Constipation: LBM 5/9. Added senna-docusate 2 tab VT BID and erythromycin 250 mg IV q6h for gut motility. May need suppository in AM if patient does not produce a BM. Will continue to monitor.    Pharmacy will continue to monitor and adjust per consult.   Delfin Gant, PharmD Clinical Pharmacist 07/13/2019 11:04 AM

## 2019-07-13 NOTE — Progress Notes (Signed)
CRITICAL CARE NOTE 49 year old former smoker with a history very severe COPD stage IV, who presented via EMS after being found down and unresponsive. Apparently the patient's mother placed a call to EMS. The patient has a longstanding history of COPD and noncompliance with his oxygen and CPAP. Multiple admission for COPD Of note he had recently been admitted to [facility] 29 May 2019 through 03 June 2019. He had been evaluated by palliative care during this admission and actually had a DNR order. Patient also had a 2D echo with pulmonary hypertension consistent with cor pulmonale.  Records review: Patient previously followed with Dr. Wallene Huh at [facility]. Last known PFTs 11 September 2017. FEV1 0.52 L or 20% predicted, FVC 1.49 L or 45% of predicted, FEV1/FVC 35%. FEF 25-75 6%. DLCO was 33%. This is consistent with very severe COPD.  Events: 4/13 admitted for severe resp failure from COPD 4/14 remains critically ill on vent 4/15 remains critically ill, severe COPD, failed SAT/SBT 4/16 extubated yesterday, had to be reintubated remains on ventilator 4/17 sedated on the ventilator 4/18 abdominal distention, CT abdomen and pelvis consistent with ileus and constipation 4/19-patietn remains on ventialtor awainting trach 4/20 - patient is on vent with trach, he had episodes of aggitation today but eventually improved  4/21 - patient with significant distension of abdomen, concern for aerophagia in NIV. There is mucopurulent exudation from trache this was cultures and patient placed on Unasyn for tracheitis 06/24/19 -trache site improved with less malodorous pus draining, NGT with decompression worked well abdomen is less distended. S/p PEG 06/25/19-patient had some anxiety throughout day today, we have started low dose anxiolytic. His PEG is now being utilized. He has some placement issues with social work due to insurance and we are working on this now.  06/26/19-Patient had no events  today, we are waiting for placement now from transitional care team 06/27/19- Patient is calm this morining, abdomen is mildy distended, awaiting placement to [facility] 4/26 plan for PS/TCT 4/27 Pt failed trach collar twice today. Became visibly distressed, diaphoretic, o2 drops, and accessory muscle use.  4/28increased WOB, resp distress, using accessory muscles to breath while on vent 4/29 patient placed on PS mode, given BD therapy, alert and awake, was able to communicate with PM valve 07/02/19-trache changed to cuffed size 6 without incident, patient reports more comfort with improved vocalization.  07/03/19- no overnight events, patient sitting up in bed in no distress.  5/3severe hypoxia and increased WOB, placed back on full vent support 5/4 severe resp distress, can NOT tolerate PS mode, severe resp failure End stage COPD 5/5 severe resp distress, can not tolerate TCT, severe end stage COPD 5/7 sitting up in chair, able to do some pressure support trials but no trach collar trials. Still appears very tachypneic 5/8 cannot tolerate trach collar, few hours of PS only 5/9 remains vent dependent.  Minimal progress with weaning 5/10 increased WOB near cardiac arrest, distended abd  CC  follow up COPD Follow up resp failure  SUBJECTIVE On vent, failure to wean Severe end stage COPD +air trapping DNR status    BP 105/77 (BP Location: Left Arm)   Pulse 79   Temp 98.2 F (36.8 C) (Axillary)   Resp 16   Ht 5\' 6"  (1.676 m)   Wt 55.9 kg   SpO2 96%   BMI 19.89 kg/m    I/O last 3 completed shifts: In: 20 [I.V.:20] Out: 2825 [Urine:2825] No intake/output data recorded.  SpO2: 96 % O2 Flow Rate (  L/min): 2 L/min FiO2 (%): 40 %  Estimated body mass index is 19.89 kg/m as calculated from the following:   Height as of this encounter: 5\' 6"  (1.676 m).   Weight as of this encounter: 55.9 kg.   Review of Systems:  Gen:  Denies  fever, sweats, chills weight loss  HEENT: Denies  blurred vision, double vision, ear pain, eye pain, hearing loss, nose bleeds, sore throat Cardiac:  No dizziness, chest pain or heaviness, chest tightness,edema, No JVD Resp:  +SOB Other:  All other systems negative   Pressure Injury 06/24/19 Buttocks Right Stage 3 -  Full thickness tissue loss. Subcutaneous fat may be visible but bone, tendon or muscle are NOT exposed. (Active)  06/24/19 0200  Location: Buttocks  Location Orientation: Right  Staging: Stage 3 -  Full thickness tissue loss. Subcutaneous fat may be visible but bone, tendon or muscle are NOT exposed.  Wound Description (Comments):   Present on Admission: No   PHYSICAL EXAMINATION:  GENERAL:critically ill appearing, +resp distress HEAD: Normocephalic, atraumatic.  EYES: Pupils equal, round, reactive to light.  No scleral icterus.  MOUTH: Moist mucosal membrane. NECK: Supple. No thyromegaly. No nodules. No JVD.  PULMONARY: +rhonchi, +wheezing CARDIOVASCULAR: S1 and S2. Regular rate and rhythm. No murmurs, rubs, or gallops.  GASTROINTESTINAL: Soft, nontender, -distended. Positive bowel sounds.  MUSCULOSKELETAL: No swelling, clubbing, or edema.  NEUROLOGIC: alert and awake SKIN:intact,warm,dry   MEDICATIONS: I have reviewed all medications and confirmed regimen as documented   Nutrition Status: Nutrition Problem: Severe Malnutrition Etiology: chronic illness(COPD) Signs/Symptoms: severe fat depletion, severe muscle depletion Interventions: Refer to RD note for recommendations      ASSESSMENT AND PLAN SYNOPSIS   Severe ACUTE Hypoxic and Hypercapnic Respiratory Failure from end stage COPD and severe Vent dependant  resp failure Continue VENT SUPPORT, UNABLE TO WEAN -continue Bronchodilator Therapy Unable to wean  ventilator dependent for life   ACUTE COR PULMONALE PULM HTN VENT SUPPORT VENT DEPENDANT VERY POOR PROGNOSIS  CARDIAC ICU monitoring   GI GI PROPHYLAXIS as indicated  NUTRITIONAL  STATUS Nutrition Status: Nutrition Problem: Severe Malnutrition Etiology: chronic illness(COPD) Signs/Symptoms: severe fat depletion, severe muscle depletion Interventions: Refer to RD note for recommendations   DIET-->NPO, needs BM, start erythromycin for gut motility Constipation protocol as indicated  ENDO - will use ICU hypoglycemic\Hyperglycemia protocol if indicated    ELECTROLYTES -follow labs as needed -replace as needed -pharmacy consultation and following    DVT/GI PRX ordered TRANSFUSIONS AS NEEDED MONITOR FSBS ASSESS the need for LABS as needed     Critical Care Time devoted to patient care services described in this note is 32 minutes.   Overall, patient is critically ill, prognosis is guarded.  Patient is DNR  06/26/19, M.D.  Lucie Leather Pulmonary & Critical Care Medicine  Medical Director [facility] Medical Director [facility] Cardio-Pulmonary Department

## 2019-07-13 NOTE — Consult Note (Signed)
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  He is on an air mattress to reduce pressure.  Topical treatment orders provided for bedside nurses to perform daily as follows to protect and promote healing: Foam dressing to buttock, change Q 3 days or PRN soiling.  Discussed with patient importance of turning (completely turning) on his side to relieve pressure. Verbalized understanding.   WOC team will reassess location weekly to determine if a change in the plan of care is indicated at that time.   Chris Case [facility], CNS, [employer] [phone]

## 2019-07-14 LAB — CBC WITH DIFFERENTIAL/PLATELET
Abs Immature Granulocytes: 0.08 10*3/uL — ABNORMAL HIGH (ref 0.00–0.07)
Basophils Absolute: 0 10*3/uL (ref 0.0–0.1)
Basophils Relative: 0 %
Eosinophils Absolute: 0.3 10*3/uL (ref 0.0–0.5)
Eosinophils Relative: 4 %
HCT: 26.5 % — ABNORMAL LOW (ref 39.0–52.0)
Hemoglobin: 8.9 g/dL — ABNORMAL LOW (ref 13.0–17.0)
Immature Granulocytes: 1 %
Lymphocytes Relative: 16 %
Lymphs Abs: 1.1 10*3/uL (ref 0.7–4.0)
MCH: 31.6 pg (ref 26.0–34.0)
MCHC: 33.6 g/dL (ref 30.0–36.0)
MCV: 94 fL (ref 80.0–100.0)
Monocytes Absolute: 0.7 10*3/uL (ref 0.1–1.0)
Monocytes Relative: 11 %
Neutro Abs: 4.6 10*3/uL (ref 1.7–7.7)
Neutrophils Relative %: 68 %
Platelets: 106 10*3/uL — ABNORMAL LOW (ref 150–400)
RBC: 2.82 MIL/uL — ABNORMAL LOW (ref 4.22–5.81)
RDW: 14.1 % (ref 11.5–15.5)
WBC: 6.8 10*3/uL (ref 4.0–10.5)
nRBC: 0 % (ref 0.0–0.2)

## 2019-07-14 LAB — GLUCOSE, CAPILLARY
Glucose-Capillary: 104 mg/dL — ABNORMAL HIGH (ref 70–99)
Glucose-Capillary: 113 mg/dL — ABNORMAL HIGH (ref 70–99)
Glucose-Capillary: 152 mg/dL — ABNORMAL HIGH (ref 70–99)
Glucose-Capillary: 61 mg/dL — ABNORMAL LOW (ref 70–99)
Glucose-Capillary: 65 mg/dL — ABNORMAL LOW (ref 70–99)
Glucose-Capillary: 68 mg/dL — ABNORMAL LOW (ref 70–99)
Glucose-Capillary: 86 mg/dL (ref 70–99)
Glucose-Capillary: 86 mg/dL (ref 70–99)
Glucose-Capillary: 89 mg/dL (ref 70–99)

## 2019-07-14 LAB — BASIC METABOLIC PANEL
Anion gap: 10 (ref 5–15)
BUN: 18 mg/dL (ref 6–20)
CO2: 30 mmol/L (ref 22–32)
Calcium: 8.9 mg/dL (ref 8.9–10.3)
Chloride: 96 mmol/L — ABNORMAL LOW (ref 98–111)
Creatinine, Ser: 0.48 mg/dL — ABNORMAL LOW (ref 0.61–1.24)
GFR calc Af Amer: >60 mL/min (ref >60)
GFR calc non Af Amer: >60 mL/min (ref >60)
Glucose, Bld: 70 mg/dL (ref 70–99)
Potassium: 3.5 mmol/L (ref 3.5–5.1)
Sodium: 136 mmol/L (ref 135–145)

## 2019-07-14 LAB — MAGNESIUM: Magnesium: 1.9 mg/dL (ref 1.7–2.4)

## 2019-07-14 LAB — PHOSPHORUS: Phosphorus: 3.4 mg/dL (ref 2.5–4.6)

## 2019-07-14 MED ORDER — DEXTROSE 50 % IV SOLN
INTRAVENOUS | Status: AC
  Administered 2019-07-14: 05:00:00 50 mL
  Filled 2019-07-14: qty 50

## 2019-07-14 MED ORDER — DEXTROSE 5 % IV SOLN: INTRAVENOUS | Status: DC

## 2019-07-14 MED ORDER — DEXTROSE 50 % IV SOLN
INTRAVENOUS | Status: AC
  Administered 2019-07-14: 50 mL
  Filled 2019-07-14: qty 50

## 2019-07-14 MED ORDER — SENNOSIDES-DOCUSATE SODIUM 8.6-50 MG PO TABS: 2.0000 | ORAL_TABLET | Freq: Two times a day (BID) | ORAL | Status: DC | PRN

## 2019-07-14 MED ORDER — DEXTROSE 50 % IV SOLN
INTRAVENOUS | Status: AC
  Administered 2019-07-14: 01:00:00 50 mL
  Filled 2019-07-14: qty 50

## 2019-07-14 NOTE — TOC Progression Note (Addendum)
Transition of Care [facility]) - Progression Note    Patient Details  Name: Chris Case MRN: [id number] Date of Birth: 1970-10-19  Transition of Care [facility]) CM/SW Contact  Liliana Cline, LCSW Phone Number: 07/14/2019, 1:45 PM  Clinical Narrative:   CSW spoke with Alphonsa Overall then followed up with patient. Patient reported he is agreeable to CSW sending out his information to SNFs for short-term rehab. CSW explained that SNF cannot force patient to stay there as this was his concern (losing his autonomy). Patient reported he does not want to go to [place] (or out of state). CSW provided reassurance. Patient was agreeable to CSW sending patient's information to SNFs in [place] and following up based on if any of them are able to accommodate his trach and vent. He said he would decide then based on what his options are. CSW updated MD and RN. PASRR completed. Asked MD what vent/trach settings would be at this time if discharged now so that SNF work up can be done.  3:40- Sent information out to SNFs.    Expected Discharge Plan: Home w Home Health Services Barriers to Discharge: Continued Medical Work up  Expected Discharge Plan and Services Expected Discharge Plan: Home w Home Health Services     Post Acute Care Choice: Home Health Living arrangements for the past 2 months: Single Family Home                             HH Agency: [facility]     Representative spoke with at [facility] Agency: Feliberto Gottron   Social Determinants of Health (SDOH) Interventions    Readmission Risk Interventions Readmission Risk Prevention Plan 06/29/2019  Transportation Screening Complete  PCP or Specialist Appt within 3-5 Days Complete  HRI or Home Care Consult Complete  Palliative Care Screening Complete  Medication Review (RN Care Manager) Complete  Some recent data might be hidden

## 2019-07-14 NOTE — Progress Notes (Signed)
PHARMACY CONSULT NOTE  Pharmacy Consult for Electrolyte Monitoring and Replacement   Recent Labs: Potassium (mmol/L)  Date Value  07/14/2019 3.5  09/24/2013 4.6   Magnesium (mg/dL)  Date Value  35/32/9924 1.9   Calcium (mg/dL)  Date Value  26/83/4196 8.9   Calcium, Total (mg/dL)  Date Value  22/29/7989 8.9   Albumin (g/dL)  Date Value  21/19/4174 3.5   Phosphorus (mg/dL)  Date Value  10/15/4816 3.4   Sodium (mmol/L)  Date Value  07/14/2019 136  09/24/2013 138    Assessment: 49 year old male admitted after being found unresponsive and admitted with end stage COPD. Patient was intubated in the ED and transferred to the ICU. Patient with trach and G-tube.  Electrolytes: Goal: potassium 4.0 - 5.1 mmol/L, magnesium 2.0 - 2.4 mg/dL, all other electrolytes WNL   No electrolyte replacement indicated at this time. Labs are still ordered daily. Will follow along with team.  Glucose: Following CBGs q4h. Patient receiving D5 today. Tube feeds being held one more day and will begin 5/13. Plan for nurse to continue q4h CBG checks and treat with dextrose as indicated.    Constipation: LBM 5/12. Patient had multiple bowel movements since yesterday, so Senokot-S was not given today, use BID PRN. Continue erythromycin 250 mg IV q6h for gut motility. Will continue to monitor.    Pharmacy will continue to monitor and adjust per consult.   Delfin Gant, PharmD Clinical Pharmacist 07/14/2019 1:16 PM

## 2019-07-14 NOTE — Progress Notes (Signed)
CH visited pt. to drop off AD paperwork and follow up from AM conversation; pt. will review paperwork.  Pt. said he talked w/CSW --> exploring options for rehab.  Pt. complained that he has not eaten in three days and was only given 'sugar water' today; Pt. expressed particular frustration at observing one of the medical team members bring a box of [employer] donuts into the ICU break room.  Pt. requested prayer for himself and for the medical team in ICU.  Ch will continue to monitor pt. needs.    07/14/19 1400  Clinical Encounter Type  Visited With Patient  Visit Type Follow-up;Spiritual support;Social support;Critical Care  Referral From Patient  Spiritual Encounters  Spiritual Needs Emotional;Prayer  Stress Factors  Patient Stress Factors Health changes;Major life changes;Loss of control

## 2019-07-14 NOTE — Progress Notes (Signed)
CRITICAL CARE NOTE 49 year old former smoker with a history very severe COPD stage IV, who presented via EMS after being found down and unresponsive. Apparently the patient's mother placed a call to EMS. The patient has a longstanding history of COPD and noncompliance with his oxygen and CPAP. Multiple admission for COPD Of note he had recently been admitted to [facility] 29 May 2019 through 03 June 2019. He had been evaluated by palliative care during this admission and actually had a DNR order. Patient also had a 2D echo with pulmonary hypertension consistent with cor pulmonale.  Records review: Patient previously followed with Dr. Ned Clines at [facility]. Last known PFTs 11 September 2017. FEV1 0.52 L or 20% predicted, FVC 1.49 L or 45% of predicted, FEV1/FVC 35%. FEF 25-75 6%. DLCO was 33%. This is consistent with very severe COPD.  Events: 4/13 admitted for severe resp failure from COPD 4/14 remains critically ill on vent 4/15 remains critically ill, severe COPD, failed SAT/SBT 4/16 extubated yesterday, had to be reintubated remains on ventilator 4/17 sedated on the ventilator 4/18 abdominal distention, CT abdomen and pelvis consistent with ileus and constipation 4/19-patietn remains on ventialtor awainting trach 4/20 - patient is on vent with trach, he had episodes of aggitation today but eventually improved  4/21 - patient with significant distension of abdomen, concern for aerophagia in NIV. There is mucopurulent exudation from trache this was cultures and patient placed on Unasyn for tracheitis 06/24/19 -trache site improved with less malodorous pus draining, NGT with decompression worked well abdomen is less distended. S/p PEG 06/25/19-patient had some anxiety throughout day today, we have started low dose anxiolytic. His PEG is now being utilized. He has some placement issues with social work due to insurance and we are working on this now.  06/26/19-Patient had no events  today, we are waiting for placement now from transitional care team 06/27/19- Patient is calm this morining, abdomen is mildy distended, awaiting placement to [facility] 4/26 plan for PS/TCT 4/27 Pt failed trach collar twice today. Became visibly distressed, diaphoretic, o2 drops, and accessory muscle use.  4/28increased WOB, resp distress, using accessory muscles to breath while on vent 4/29 patient placed on PS mode, given BD therapy, alert and awake, was able to communicate with PM valve 07/02/19-trache changed to cuffed size 6 without incident, patient reports more comfort with improved vocalization.  07/03/19- no overnight events, patient sitting up in bed in no distress.  5/3severe hypoxia and increased WOB, placed back on full vent support 5/4 severe resp distress, can NOT tolerate PS mode, severe resp failure End stage COPD 5/5 severe resp distress, can not tolerate TCT, severe end stage COPD 5/7 sitting up in chair, able to do some pressure support trials but no trach collar trials. Still appears very tachypneic 5/8 cannot tolerate trach collar, few hours of PS only 5/9 remains vent dependent.  Minimal progress with weaning 5/10 increased WOB near cardiac arrest, distended abd 5/12 on PS mode  CC Follow up COPD Follow up resp failure   SUBJECTIVE End stage COPD On PS mode Seems to be comfortable DNR status    BP (!) 136/100   Pulse 83   Temp 98.3 F (36.8 C) (Oral)   Resp (!) 9   Ht 5\' 6"  (1.676 m)   Wt 55 kg   SpO2 99%   BMI 19.57 kg/m    I/O last 3 completed shifts: In: 40 [I.V.:40] Out: 1850 [Urine:1850] Total I/O In: 429.1 [I.V.:267.7; IV Piggyback:161.4] Out: 300 [Urine:300]  SpO2: 99 % O2 Flow Rate (L/min): 2 L/min FiO2 (%): 35 %  Estimated body mass index is 19.57 kg/m as calculated from the following:   Height as of this encounter: 5\' 6"  (1.676 m).   Weight as of this encounter: 55 kg.    Review of Systems:  Gen:  Denies  fever, sweats, chills  weight loss  HEENT: Denies blurred vision, double vision, ear pain, eye pain, hearing loss, nose bleeds, sore throat Cardiac:  No dizziness, chest pain or heaviness, chest tightness,edema, No JVD Resp: +SOB Other:  All other systems negative   Pressure Injury 06/24/19 Buttocks Right Stage 3 -  Full thickness tissue loss. Subcutaneous fat may be visible but bone, tendon or muscle are NOT exposed. (Active)  06/24/19 0200  Location: Buttocks  Location Orientation: Right  Staging: Stage 3 -  Full thickness tissue loss. Subcutaneous fat may be visible but bone, tendon or muscle are NOT exposed.  Wound Description (Comments):   Present on Admission: No   PHYSICAL EXAMINATION:  GENERAL:critically ill appearing,  HEAD: Normocephalic, atraumatic.  EYES: Pupils equal, round, reactive to light.  No scleral icterus.  MOUTH: Moist mucosal membrane. NECK: Supple. No thyromegaly. No nodules. No JVD.  PULMONARY: +rhonchi, +wheezing CARDIOVASCULAR: S1 and S2. Regular rate and rhythm. No murmurs, rubs, or gallops.  GASTROINTESTINAL: Soft, nontender, -distended. Positive bowel sounds.  MUSCULOSKELETAL: No swelling, clubbing, or edema.  NEUROLOGIC:alert and awake SKIN:intact,warm,dry    MEDICATIONS: I have reviewed all medications and confirmed regimen as documented   Nutrition Status: Nutrition Problem: Severe Malnutrition Etiology: chronic illness(COPD) Signs/Symptoms: severe fat depletion, severe muscle depletion Interventions: Refer to RD note for recommendations      ASSESSMENT AND PLAN SYNOPSIS  Severe ACUTE Hypoxic and Hypercapnic Respiratory Failure due to end stage COPD -continue Bronchodilator Therapy ventilator dependent for life Tolerating PS mode today  ACUTE COR PULMONALE PULM HTN VENT SUPPORT OXYGEN SUPPORT   CARDIAC ICU monitoring  GI hold feeds for next 24 hrs   NUTRITIONAL STATUS Nutrition Status: Nutrition Problem: Severe Malnutrition Etiology: chronic  illness(COPD) Signs/Symptoms: severe fat depletion, severe muscle depletion Interventions: Refer to RD note for recommendations   DIET-->NPO, needs BM, start erythromycin for gut motility Constipation protocol as indicated  ENDO Started  Back on D5 infusion for hypoglycemia  ELECTROLYTES -follow labs as needed -replace as needed -pharmacy consultation and following    DVT/GI PRX ordered TRANSFUSIONS AS NEEDED MONITOR FSBS ASSESS the need for LABS as needed     Critical Care Time devoted to patient care services described in this note is 31 minutes.   Overall, patient is critically ill, prognosis is guarded.      Corrin Parker, M.D.  [facility]  Medical Director [facility] Director [facility] Cardio-Pulmonary Department

## 2019-07-14 NOTE — Plan of Care (Signed)
PMT note:  Consult noted. In to see patient. Shortly after coming to bedside, patient began to complain of SOB, and wanted ventilator adjustments. RT to bedside to make adjustments to pressure support. Patient appears uncomfortable. Will return tomorrow for GOC discussion.

## 2019-07-14 NOTE — Progress Notes (Signed)
CH visited pt. while rounding on ICU; pt. in bed sitting up.  Reported 'feeling OK' today.  CH and pt. had lengthy discussion re: pt.'s plan of care.  Pt. expressed frustration that when he has difficulty breathing medical team has been placing him back on vent and giving him medication to calm him down; he would like to learn to calm himself down instead of using medication and believes it would be helpful if he were allowed to use nasal canula @ bedside to supplement his O2 when he feels he needs more air.  Pt. says he has discussed this w/team but that they've said having vent and nasal canula is 'too much air'.  CH discussed advanced directives w/pt. in light of CH's prior discussions w/medical team in which [facility] learned that pt.'s family directed he be intubated when he was brought to ED most recently; when [facility] asked if pt. would like family to continue to make decisions re: his care he said 'hell no!'.  Pt. reticent in naming alternate [facility], however; pt. requested time to think --> CH will follow up tomorrow.  Pt. continues to express strong desire to go home in lieu of rehab; does not understand why medical team wants rehab for him, but is grateful to them for all the care he has received.  CH offered a prayer for pt.'s continued sense of divine strength and presence.  Will follow up if possible tomorrow.  CH discussed pt.'s concerns and wishes w/charge RN (Pt.'s RN in another rm.); charge RN explained pt. needs pressure support, not just O2, when his sats drop --> nasal canula insufficient breathing support at these times.    07/13/19 1530  Clinical Encounter Type  Visited With Patient;Health care provider  Visit Type Follow-up;Psychological support;Spiritual support;Social support;Critical Care  Referral From Other (Comment);Physician (Routine Rounding)  Consult/Referral To Nurse;Social work  Recommendations investigate plan for rehab to communicate to pt.  Spiritual Encounters  Spiritual Needs  Prayer;Emotional  Stress Factors  Patient Stress Factors Lack of knowledge;Health changes;Loss of control;Major life changes

## 2019-07-14 NOTE — Progress Notes (Deleted)
The Clinical status was relayed to family in detail. UPDATED WIFE  Updated and notified of patients medical condition.  Patient with Progressive multiorgan failure with HIGH PROBABILITY OF very low chance of meaningful recovery despite all aggressive and optimal medical therapy.  Patient is  suffering.  Family understands the situation.    Family are satisfied with Plan of action and management. All questions answered  Additional CC time 32 mins   Clydia Nieves Santiago Glad, M.D.  [facility]  Medical Director [facility] Medical Director [facility] Cardio-Pulmonary Department

## 2019-07-14 NOTE — Progress Notes (Signed)
CH visited pt. this AM in follow-up from discussion re: plan of care yesterday, and in response to discussion w/physician and CSW.  Physician explained pt. needs additional OT/PT in rehab in order to be able to be discharged to home; rehab can offer more intensive therapy than is possible in ICU.  CH believes pt. may be amenable to rehab if goals are clear and if rehab is known to be temporary and a stepping stone to going home.  CH discussed rehab w/pt.  Pt. seemed open to discussing rehab further w/CSW; pt. expressed a sense of not wanting to lose control of his care.  CH senses [facility] is now a known and trusted entity for pt. --> rehab seen as potential loss of control and autonomy.  CH explained rehab is step towards getting pt. strong enough to go home safely.  Pt. expressed understanding.   Pt. has thought about AD and has written down names of parties he would like to be made MPOA for him.  He discussed this list w/his roommate last night; roommate would like to be primary MPOA, pt. said, but pt. is worried roommate may be asleep if contacted by medical team.  CH will follow up later today w/AD paperwork and to offer requested prayer for pt.    07/14/19 1100  Clinical Encounter Type  Visited With Patient  Visit Type Follow-up;Psychological support;Social support;Critical Care  Referral From Physician;Social work;Chaplain  Consult/Referral To Nurse;Social work  Games developer  Spiritual Needs Emotional;Prayer  Stress Factors  Patient Stress Factors Lack of knowledge;Health changes;Loss of control;Major life changes

## 2019-07-14 NOTE — Progress Notes (Signed)
PT Cancellation Note  Patient Details Name: Chris Case MRN: [id number] DOB: 27-Apr-1970   Cancelled Treatment:    Reason Eval/Treat Not Completed: Other (comment).  Per discussion with pt's nurse, pt on pressure support today (on vent) and recommending holding PT at this time to allow pt to rest.  Will re-attempt PT treatment session at a later date/time.  Hendricks Limes, PT 07/14/19, 4:07 PM

## 2019-07-14 NOTE — NC FL2 (Signed)
[place] MEDICAID FL2 LEVEL OF CARE SCREENING TOOL     IDENTIFICATION  Patient Name: Chris Case Birthdate: Oct 07, 1970 Sex: male Admission Date (Current Location): 06/15/2019  [place] and [place] Number:  Chiropodist and Address:  [facility], [address] [place], [place] [address]      Provider Number: [id number]  Attending Physician Name and Address:  Erin Fulling, MD  Relative Name and Phone Number:  Jannette Spanner- [phone]    Current Level of Care: Hospital Recommended Level of Care: Skilled Nursing Facility, Vent SNF Prior Approval Number:    Date Approved/Denied:   PASRR Number: [id number]  Discharge Plan: Home    Current Diagnoses: Patient Active Problem List   Diagnosis Date Noted  . Pressure injury of skin 06/28/2019  . Unresponsiveness 06/15/2019  . Encounter for hospice care discussion   . Left eyelid laceration   . Acute metabolic encephalopathy   . Pulmonary hypertension (HCC)   . Goals of care, counseling/discussion   . Palliative care by specialist   . DNR (do not resuscitate) discussion   . COPD, very severe (HCC)   . Elevated troponin 05/29/2019  . Syncope 05/29/2019  . Acute on chronic respiratory failure with hypoxemia (HCC) 04/16/2019  . Acute respiratory failure with hypoxia (HCC) 01/24/2019  . Thrombocytopenia (HCC) 01/24/2019  . Protein-calorie malnutrition, severe 12/10/2016  . Intestinal adhesions with complete obstruction (HCC) 12/05/2016  . Constipation   . SBO (small bowel obstruction) (HCC)   . Acute bronchitis 09/26/2015  . COPD exacerbation (HCC) 09/24/2015  . Acute on chronic respiratory failure with hypoxia and hypercapnia (HCC) 09/24/2015  . Abnormal ECG 09/24/2015  . Edema, lower extremity 09/24/2015    Orientation RESPIRATION BLADDER Height & Weight     Self, Time, Situation, Place  Vent, Tracheostomy Continent Weight: 121 lb 4.1 oz (55 kg) Height:  5\' 6"  (167.6 cm)   BEHAVIORAL SYMPTOMS/MOOD NEUROLOGICAL BOWEL NUTRITION STATUS    (acute metabolic encephalopathy) Continent Feeding tube  AMBULATORY STATUS COMMUNICATION OF NEEDS Skin   Extensive Assist Verbally Other (Comment)(Pressure Injury R Buttocks Stage 3- foam dressing, check daily, change every 3 days.)                       Personal Care Assistance Level of Assistance  Bathing, Feeding, Dressing Bathing Assistance: Maximum assistance Feeding assistance: Limited assistance Dressing Assistance: Maximum assistance     Functional Limitations Info             SPECIAL CARE FACTORS FREQUENCY  OT (By licensed OT), PT (By licensed PT), Speech therapy     PT Frequency: 5x/week OT Frequency: 5x/week     Speech Therapy Frequency: 5x/week      Contractures Contractures Info: Not present    Additional Factors Info  Code Status, Allergies Code Status Info: DNR Allergies Info: NKA           Current Medications (07/14/2019):  This is the current hospital active medication list Current Facility-Administered Medications  Medication Dose Route Frequency Provider Last Rate Last Admin  . 0.9 %  sodium chloride infusion   Intravenous PRN 09/13/2019, MD   Stopped at 06/25/19 1712  . acetaminophen (TYLENOL) suppository 650 mg  650 mg Rectal Q6H PRN 06/27/19, NP      . acetaminophen (TYLENOL) tablet 650 mg  650 mg Per Tube Q4H PRN Eugenie Norrie, MD   650 mg at 06/30/19 2229  . ALPRAZolam 2230) tablet  0.25 mg  0.25 mg Per Tube TID Awilda Bill, NP   0.25 mg at 07/14/19 1130  . budesonide (PULMICORT) nebulizer solution 0.5 mg  0.5 mg Nebulization BID Flora Lipps, MD   0.5 mg at 07/14/19 0806  . chlorhexidine (PERIDEX) 0.12 % solution 15 mL  15 mL Mouth Rinse BID Awilda Bill, NP   15 mL at 07/14/19 1127  . Chlorhexidine Gluconate Cloth 2 % PADS 6 each  6 each Topical Daily Jules Husbands, MD   6 each at 07/13/19 2200  . dextrose 5 % solution   Intravenous Continuous  Flora Lipps, MD   Stopped at 07/14/19 1320  . enoxaparin (LOVENOX) injection 40 mg  40 mg Subcutaneous Q24H Charlett Nose, RPH   40 mg at 07/14/19 1129  . erythromycin 250 mg in sodium chloride 0.9 % 100 mL IVPB  250 mg Intravenous Q6H Flora Lipps, MD 100 mL/hr at 07/14/19 1400 Rate Verify at 07/14/19 1400  . famotidine (PEPCID) tablet 20 mg  20 mg Per Tube BID Charlett Nose, RPH   20 mg at 07/14/19 1129  . feeding supplement (OSMOLITE 1.5 CAL) liquid 1,000 mL  1,000 mL Per Tube Continuous Awilda Bill, NP 40 mL/hr at 07/12/19 0438 Restarted at 07/12/19 0438  . ipratropium-albuterol (DUONEB) 0.5-2.5 (3) MG/3ML nebulizer solution 3 mL  3 mL Nebulization Q6H PRN Caroleen Hamman F, MD   3 mL at 07/03/19 2038  . ipratropium-albuterol (DUONEB) 0.5-2.5 (3) MG/3ML nebulizer solution 3 mL  3 mL Nebulization Q4H Flora Lipps, MD   3 mL at 07/14/19 1127  . LORazepam (ATIVAN) injection 1 mg  1 mg Intravenous Q4H PRN Ottie Glazier, MD   1 mg at 07/12/19 0815  . MEDLINE mouth rinse  15 mL Mouth Rinse q12n4p Awilda Bill, NP   15 mL at 07/14/19 1323  . melatonin tablet 10 mg  10 mg Per Tube QHS Flora Lipps, MD   10 mg at 07/13/19 2205  . morphine 2 MG/ML injection 2 mg  2 mg Intravenous Q4H PRN Ottie Glazier, MD   2 mg at 07/14/19 0901  . nutrition supplement (JUVEN) (JUVEN) powder packet 1 packet  1 packet Per Tube BID BM Flora Lipps, MD   1 packet at 07/13/19 1055  . senna-docusate (Senokot-S) tablet 2 tablet  2 tablet Per Tube BID PRN Flora Lipps, MD      . sodium chloride flush (NS) 0.9 % injection 10-40 mL  10-40 mL Intracatheter Q12H Pabon, Ventress F, MD   10 mL at 07/14/19 1139  . sodium chloride flush (NS) 0.9 % injection 10-40 mL  10-40 mL Intracatheter PRN Pabon, Marjory Lies, MD         Discharge Medications: Please see discharge summary for a list of discharge medications.  Relevant Imaging Results:  Relevant Lab Results:   Additional Information SS#: [id number]  [place], LCSW

## 2019-07-14 NOTE — Progress Notes (Signed)
Patient spent most of the day on pressure support vent setting, began to tire out in the afternoon.  PT evaluated the patient, did not perform any exercises.  Speech evaluated the patient, decided against pt eating while vented.  Pt to remain NPO with tube feed off until tomorrow.  Pt had bm during the shift, but still reports abdominal discomfort.  Abdomen beginning to feel tight.  Will continue to monitor.

## 2019-07-15 LAB — GLUCOSE, CAPILLARY
Glucose-Capillary: 102 mg/dL — ABNORMAL HIGH (ref 70–99)
Glucose-Capillary: 150 mg/dL — ABNORMAL HIGH (ref 70–99)
Glucose-Capillary: 64 mg/dL — ABNORMAL LOW (ref 70–99)
Glucose-Capillary: 74 mg/dL (ref 70–99)
Glucose-Capillary: 76 mg/dL (ref 70–99)
Glucose-Capillary: 84 mg/dL (ref 70–99)
Glucose-Capillary: 86 mg/dL (ref 70–99)
Glucose-Capillary: 88 mg/dL (ref 70–99)

## 2019-07-15 LAB — CBC WITH DIFFERENTIAL/PLATELET
Abs Immature Granulocytes: 0.08 10*3/uL — ABNORMAL HIGH (ref 0.00–0.07)
Basophils Absolute: 0 10*3/uL (ref 0.0–0.1)
Basophils Relative: 0 %
Eosinophils Absolute: 0.2 10*3/uL (ref 0.0–0.5)
Eosinophils Relative: 4 %
HCT: 26.5 % — ABNORMAL LOW (ref 39.0–52.0)
Hemoglobin: 8.6 g/dL — ABNORMAL LOW (ref 13.0–17.0)
Immature Granulocytes: 1 %
Lymphocytes Relative: 13 %
Lymphs Abs: 0.9 10*3/uL (ref 0.7–4.0)
MCH: 30.6 pg (ref 26.0–34.0)
MCHC: 32.5 g/dL (ref 30.0–36.0)
MCV: 94.3 fL (ref 80.0–100.0)
Monocytes Absolute: 0.7 10*3/uL (ref 0.1–1.0)
Monocytes Relative: 11 %
Neutro Abs: 5 10*3/uL (ref 1.7–7.7)
Neutrophils Relative %: 71 %
Platelets: 100 10*3/uL — ABNORMAL LOW (ref 150–400)
RBC: 2.81 MIL/uL — ABNORMAL LOW (ref 4.22–5.81)
RDW: 13.9 % (ref 11.5–15.5)
WBC: 7 10*3/uL (ref 4.0–10.5)
nRBC: 0 % (ref 0.0–0.2)

## 2019-07-15 LAB — BASIC METABOLIC PANEL
Anion gap: 8 (ref 5–15)
BUN: 11 mg/dL (ref 6–20)
CO2: 30 mmol/L (ref 22–32)
Calcium: 8.5 mg/dL — ABNORMAL LOW (ref 8.9–10.3)
Chloride: 97 mmol/L — ABNORMAL LOW (ref 98–111)
Creatinine, Ser: 0.53 mg/dL — ABNORMAL LOW (ref 0.61–1.24)
GFR calc Af Amer: >60 mL/min (ref >60)
GFR calc non Af Amer: >60 mL/min (ref >60)
Glucose, Bld: 216 mg/dL — ABNORMAL HIGH (ref 70–99)
Potassium: 3.2 mmol/L — ABNORMAL LOW (ref 3.5–5.1)
Sodium: 135 mmol/L (ref 135–145)

## 2019-07-15 LAB — MAGNESIUM: Magnesium: 1.7 mg/dL (ref 1.7–2.4)

## 2019-07-15 LAB — PHOSPHORUS: Phosphorus: 3.5 mg/dL (ref 2.5–4.6)

## 2019-07-15 MED ORDER — SENNOSIDES-DOCUSATE SODIUM 8.6-50 MG PO TABS
2.0000 | ORAL_TABLET | Freq: Two times a day (BID) | ORAL | Status: DC
  Administered 2019-07-15 – 2019-07-29 (×21): 2
  Filled 2019-07-15 (×21): qty 2

## 2019-07-15 MED ORDER — DEXTROSE 50 % IV SOLN
1.0000 | Freq: Once | INTRAVENOUS | Status: AC
  Administered 2019-07-15: 50 mL via INTRAVENOUS

## 2019-07-15 MED ORDER — POLYETHYLENE GLYCOL 3350 17 G PO PACK
17.0000 g | PACK | Freq: Every day | ORAL | Status: DC
  Administered 2019-07-15 – 2019-07-29 (×11): 17 g
  Filled 2019-07-15 (×11): qty 1

## 2019-07-15 MED ORDER — OSMOLITE 1.5 CAL PO LIQD
1000.0000 mL | ORAL | Status: DC
  Administered 2019-07-15 – 2019-07-16 (×2): 1000 mL

## 2019-07-15 MED ORDER — DEXTROSE 50 % IV SOLN
INTRAVENOUS | Status: AC
  Administered 2019-07-15: 50 mL via INTRAVENOUS
  Filled 2019-07-15: qty 50

## 2019-07-15 MED ORDER — POTASSIUM CHLORIDE 10 MEQ/100ML IV SOLN
10.0000 meq | INTRAVENOUS | Status: AC
  Administered 2019-07-15 (×4): 10 meq via INTRAVENOUS
  Filled 2019-07-15 (×4): qty 100

## 2019-07-15 NOTE — Progress Notes (Signed)
PHARMACY CONSULT NOTE  Pharmacy Consult for Electrolyte Monitoring and Replacement   Recent Labs: Potassium (mmol/L)  Date Value  07/15/2019 3.2 (L)  09/24/2013 4.6   Magnesium (mg/dL)  Date Value  28/31/5176 1.7   Calcium (mg/dL)  Date Value  16/09/3708 8.5 (L)   Calcium, Total (mg/dL)  Date Value  62/69/4854 8.9   Albumin (g/dL)  Date Value  62/70/3500 3.5   Phosphorus (mg/dL)  Date Value  93/81/8299 3.5   Sodium (mmol/L)  Date Value  07/15/2019 135  09/24/2013 138    Assessment: 49 year old male admitted after being found unresponsive and admitted with end stage COPD. Patient was intubated in the ED and transferred to the ICU. Patient with trach and G-tube.  Electrolytes: Goal: potassium 4.0 - 5.1 mmol/L, magnesium 2.0 - 2.4 mg/dL, all other electrolytes WNL   Potassium 3.2 this AM. Gave KCl 10 mEq IVPB x 4 doses. Will continue to monitor with AM labs.   Glucose: Patient will begin tube feeds again today and we hope to get him off D5. Plan for nurse to continue q4h CBG checks. Will continue to monitor.  Constipation: LBM 5/12. Continue to use Senokot-S per tube BID PRN. Continue erythromycin 250 mg IVPB q6h for gut motility. No additional intervention needed at this time. Will continue to monitor.    Pharmacy will continue to monitor and adjust per consult.   Delfin Gant, PharmD Clinical Pharmacist 07/15/2019 1:23 PM

## 2019-07-15 NOTE — Plan of Care (Signed)
Pt had uneventful night, continued on full vent support all night, VSS, denies any pain all night. Pt refused CHG bath this morning. Pt Blood sugar up's and now. Pt received D50 IVP x2times for Blood Sugars <70. Pt still on 5% dextrose IV at 77mls/hr

## 2019-07-15 NOTE — Progress Notes (Addendum)
SLP F/U Note  Patient Details Name: Chris Case MRN: [id number] DOB: [birth year]   Cancelled treatment:       Reason Eval/Treat Not Completed: (chart reviewed; discussed pt in rounds w/ team). Per chart and MD notes, and RT report at rounds, in the past 4 days, pt is making only minimal progress w/ Weaning. He has only been able to tolerate a few hours intermittently of PS on vent; he experienced increased WOB and a near cardiac arrest 3 days ago per note. He continues to present w/ high levels of anxiety. Abdominal distension is being addressed w/ concern for ileus per report at rounds; TFs are being resumed slowly and w/ monitoring per MD, Dietician.  Pt's respiratory status continues to be tenuous, and pt himself fragile. ANY increased exertion is challenging for him and can increase his anxiety. Due to the nature of the physical demand of both respiratory support and energy, attempting oral intake on vent support presents w/ increased risk for aspiration to occur. Pt is unable to tolerate weaning to [facility] and use of a PMV at this time d/t Pulmonary status.  Recommend continued strength and conditioning tasks(PT.OT) and Nutritional support in order to build stamina for demand of exertion that oral intake requires. It would be optimal if pt could achieve Trach Collar weaning for use of PMV during oral intake to increase safety of swallowing. Recommend frequent use of Ice Chips post strict oral care for swallowing engagement. ST services will continue to follow pt's status while admitted.  The above was discussed w/ MD, NSG and team during rounds today.   Jerilynn Som, MS, CCC-SLP Wesly Whisenant 07/15/2019, 2:38 PM

## 2019-07-15 NOTE — Plan of Care (Signed)
PMT note:  Patient working with another discipline at time of visit. Will reattempt GOC at another time.

## 2019-07-15 NOTE — Progress Notes (Signed)
CH visited pt. as follow-up from numerous prior visits in past weeks; pt. in chair initially @ approx. 1pm; CH paged to other room --> returned @ 5:30pm.  Pt. back in bed; expressed gratitude for receiving bath and shampoo today.  Pt. said he has been on lower vent support all day and has not felt anxious; word search activity has been helpful in giving him something to do to relieve anxiety, he said.  Pt.'s roommate arrived @ approx. 6pm.  CH discussed pt.'s plan of care extensively w/Palliative Care NP, CSW, RN, and RT earlier today in an attempt to better understand how to advocate for pt. and support him in his goals.  Team all acknowledge the difficulty of balancing pt.'s desire to go home and resume former life rhythms and diet with need to address pt.'s significant medical needs (vent support, PT/OT needs, nutrition support, nursing care, etc.) that prevent safe discharge. CH will continue to support pt. through regular visits and requested prayer; plan to follow up Saturday if possible.    07/15/19 1800  Clinical Encounter Type  Visited With Patient and family together;Health care provider  Visit Type Follow-up;Psychological support;Social support;Critical Care  Referral From Chaplain  Spiritual Encounters  Spiritual Needs Emotional  Stress Factors  Patient Stress Factors Health changes;Family relationships;Major life changes

## 2019-07-15 NOTE — Progress Notes (Signed)
Occupational Therapy Treatment Patient Details Name: Chris Case MRN: [id number] DOB: 08/18/1970 Today's Date: 07/15/2019    History of present illness Chris Case is a 48yoM who comes to [facility] on 4/13 after being found unresponsive. Pt here 29 May 2019 through 03 June 2019. Pt required ventilation upon arrival, failed extubation on 4/16, transitioned to tach on 4/20. Pt has required NGT decompression d/t ABD distension, underwent  PEG placement and lysis adhesions 4/22. 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 Pt has since been trialed on PMV but has not been off vent support enough long enough to trial much, limited tolerance to tach collor O2.   OT comments  Pt seen for OT tx this date. Pt eager to participate. Pt just back to bed after PT session. Pt educated in cognitive behavioral strategies for stress/anxiety mgt, home/routines modifications to minimize over exertion or positional changes that would increase SOB; pt verbalized understanding. Pt attempts to utilize voice when mouthing words, leading to increased respiration rate. Pt instructed in pacing and support breath recovery including pursed lip breathing. Pt verbalized understanding. Pt provided with some word search puzzles to support occupational engagement and decreased occupational deprivation while hospitalized. Pt very appreciative. Pt continues to benefit from skilled OT services. Continue to recommend LTACH given current vent dependency.   Follow Up Recommendations  LTACH;SNF    Equipment Recommendations  3 in 1 bedside commode    Recommendations for Other Services      Precautions / Restrictions Precautions Precautions: Fall Precaution Comments: monitor O2, HR, and BP; trach collar vs mechanical ventilation; HOB >30 degrees; NPO; PEG tube; R PICC line Restrictions Weight Bearing Restrictions: No       Mobility Bed Mobility Overal bed mobility: Needs  Assistance Bed Mobility: Sit to Supine       Sit to supine: Min assist   General bed mobility comments: Min assist for assisting BLes into bed while managing line/tubes  Transfers Overall transfer level: Needs assistance Equipment used: 1 person hand held assist Transfers: Sit to/from Stand Sit to Stand: Min guard         General transfer comment: Pt performed STS 3 x  throughout session with +1 LUE HHA. he stood 2 x prior to standing and taking steps to EOB from recliner. Vcs to slow down for managment of line. Overall demonstrates safe ability to transfer    Balance                                           ADL either performed or assessed with clinical judgement   ADL Overall ADL's : Needs assistance/impaired                                       General ADL Comments: NPO, physically able to perform ADL, limited by cardiopulmonary status     Vision Baseline Vision/History: No visual deficits Patient Visual Report: No change from baseline     Perception     Praxis      Cognition Arousal/Alertness: Awake/alert Behavior During Therapy: WFL for tasks assessed/performed Overall Cognitive Status: Within Functional Limits for tasks assessed  General Comments: Pt highly motivated and agrees to PT session with minimal encouragement. pt was seated in recliner requesting to get back to bed.        Exercises Exercises: General Lower Extremity General Exercises - Lower Extremity Long Arc Quad: AROM;10 reps;Seated;Both Heel Slides: AROM;Strengthening;Both;10 reps;Supine Straight Leg Raises: AROM;Both;10 reps Other Exercises Other Exercises: Pt educated in cognitive behavioral strategies for stress/anxiety mgt, home/routines modifications to minimize over exertion or positional changes that would increase SOB; pt verbalized understanding   Shoulder Instructions       General Comments       Pertinent Vitals/ Pain       Pain Assessment: No/denies pain Pain Score: 2  Pain Location: L side  Pain Descriptors / Indicators: Sore;Tender Pain Intervention(s): Limited activity within patient's tolerance;Monitored during session  Home Living                                          Prior Functioning/Environment              Frequency  Min 1X/week        Progress Toward Goals  OT Goals(current goals can now be found in the care plan section)  Progress towards OT goals: Progressing toward goals  Acute Rehab OT Goals Patient Stated Goal: to go home Time For Goal Achievement: 07/22/19 Potential to Achieve Goals: Good  Plan Discharge plan remains appropriate;Frequency needs to be updated    Co-evaluation                 AM-PAC OT "6 Clicks" Daily Activity     Outcome Measure   Help from another person eating meals?: None(NPO) Help from another person taking care of personal grooming?: None Help from another person toileting, which includes using toliet, bedpan, or urinal?: A Lot Help from another person bathing (including washing, rinsing, drying)?: A Lot Help from another person to put on and taking off regular upper body clothing?: A Little Help from another person to put on and taking off regular lower body clothing?: A Little 6 Click Score: 18    End of Session Equipment Utilized During Treatment: Gait belt;Oxygen;Rolling walker  OT Visit Diagnosis: Other abnormalities of gait and mobility (R26.89);Muscle weakness (generalized) (M62.81)   Activity Tolerance Patient tolerated treatment well   Patient Left in bed;with call bell/phone within reach;with bed alarm set;with nursing/sitter in room   Nurse Communication          Time: 1027-2536 OT Time Calculation (min): 12 min  Charges: OT General Charges $OT Visit: 1 Visit OT Treatments $Therapeutic Activity: 8-22 mins  Jeni Salles, MPH, MS, OTR/L ascom  [phone] 07/15/19, 4:49 PM

## 2019-07-15 NOTE — Progress Notes (Signed)
Changed vent back to pcv/r=15. Patient stated he was getting tired.  Tolerated change well. Suctioned airway for small amount of secretions. Will continue to monitor.

## 2019-07-15 NOTE — Progress Notes (Signed)
CRITICAL CARE NOTE 49 year old former smoker with a history very severe COPD stage IV, who presented via EMS after being found down and unresponsive. Apparently the patient's mother placed a call to EMS. The patient has a longstanding history of COPD and noncompliance with his oxygen and CPAP. Multiple admission for COPD Of note he had recently been admitted to [facility] 29 May 2019 through 03 June 2019. He had been evaluated by palliative care during this admission and actually had a DNR order. Patient also had a 2D echo with pulmonary hypertension consistent with cor pulmonale.  Records review: Patient previously followed with Dr. Ned Clines at [facility] Last known PFTs 11 September 2017. FEV1 0.52 L or 20% predicted, FVC 1.49 L or 45% of predicted, FEV1/FVC 35%. FEF 25-75 6%. DLCO was 33%. This is consistent with very severe COPD.  Events: 4/13 admitted for severe resp failure from COPD 4/14 remains critically ill on vent 4/15 remains critically ill, severe COPD, failed SAT/SBT 4/16 extubated yesterday, had to be reintubated remains on ventilator 4/17 sedated on the ventilator 4/18 abdominal distention, CT abdomen and pelvis consistent with ileus and constipation 4/19-patietn remains on ventialtor awainting trach 4/20 - patient is on vent with trach, he had episodes of aggitation today but eventually improved  4/21 - patient with significant distension of abdomen, concern for aerophagia in NIV. There is mucopurulent exudation from trache this was cultures and patient placed on Unasyn for tracheitis 06/24/19 -trache site improved with less malodorous pus draining, NGT with decompression worked well abdomen is less distended. S/p PEG 06/25/19-patient had some anxiety throughout day today, we have started low dose anxiolytic. His PEG is now being utilized. He has some placement issues with social work due to insurance and we are working on this now.  06/26/19-Patient had no events  today, we are waiting for placement now from transitional care team 06/27/19- Patient is calm this morining, abdomen is mildy distended, awaiting placement to [facility] 4/26 plan for PS/TCT 4/27 Pt failed trach collar twice today. Became visibly distressed, diaphoretic, o2 drops, and accessory muscle use.  4/28increased WOB, resp distress, using accessory muscles to breath while on vent 4/29 patient placed on PS mode, given BD therapy, alert and awake, was able to communicate with PM valve 07/02/19-trache changed to cuffed size 6 without incident, patient reports more comfort with improved vocalization.  07/03/19- no overnight events, patient sitting up in bed in no distress.  5/3severe hypoxia and increased WOB, placed back on full vent support 5/4 severe resp distress, can NOT tolerate PS mode, severe resp failure End stage COPD 5/5 severe resp distress, can not tolerate TCT, severe end stage COPD 5/7 sitting up in chair, able to do some pressure support trials but no trach collar trials. Still appears very tachypneic 5/8 cannot tolerate trach collar, few hours of PS only 5/9 remains vent dependent.  Minimal progress with weaning 5/10 increased WOB near cardiac arrest, distended abd 5/12 on PS mode 5/14 back on PC mode, very high levels of anxiety  CC Follow up COPD Follow up resp failure   HPI End stage COPD On PC mode Very anxious Needs vent to survive DNR status Unable to wean from vent   BP (!) 130/95   Pulse 63   Temp 98.7 F (37.1 C)   Resp 15   Ht 5\' 6"  (1.676 m)   Wt 55 kg   SpO2 100%   BMI 19.57 kg/m    I/O last 3 completed shifts: In: 1461.2 [  I.V.:929.9; Other:60; IV Piggyback:471.3] Out: 2250 [Urine:2250] No intake/output data recorded.  SpO2: 100 % O2 Flow Rate (L/min): 2 L/min FiO2 (%): 40 %  Estimated body mass index is 19.57 kg/m as calculated from the following:   Height as of this encounter: 5\' 6"  (1.676 m).   Weight as of this encounter: 55  kg.     Review of Systems: Increased WOB +anxiety Using accessory muscles to breathe Alert and awake Other:  All other systems negative    Pressure Injury 06/24/19 Buttocks Right Stage 3 -  Full thickness tissue loss. Subcutaneous fat may be visible but bone, tendon or muscle are NOT exposed. (Active)  06/24/19 0200  Location: Buttocks  Location Orientation: Right  Staging: Stage 3 -  Full thickness tissue loss. Subcutaneous fat may be visible but bone, tendon or muscle are NOT exposed.  Wound Description (Comments):   Present on Admission: No   PHYSICAL EXAMINATION: PHYSICAL EXAMINATION:  GENERAL:critically ill appearing, +resp distress HEAD: Normocephalic, atraumatic.  EYES: Pupils equal, round, reactive to light.  No scleral icterus.  MOUTH: Moist mucosal membrane. NECK: Supple. No thyromegaly. No nodules. No JVD.  PULMONARY: +rhonchi, +wheezing CARDIOVASCULAR: S1 and S2. Regular rate and rhythm. No murmurs, rubs, or gallops.  GASTROINTESTINAL: Soft, nontender, -distended. Positive bowel sounds.  MUSCULOSKELETAL: No swelling, clubbing, or edema.  NEUROLOGIC: alert and awake SKIN:intact,warm,dry     MEDICATIONS: I have reviewed all medications and confirmed regimen as documented   Nutrition Status: Nutrition Problem: Severe Malnutrition Etiology: chronic illness(COPD) Signs/Symptoms: severe fat depletion, severe muscle depletion Interventions: Refer to RD note for recommendations      ASSESSMENT AND PLAN SYNOPSIS   SEVERE COPD EXACERBATION/END STAGE COPD VENT DEPENDANT INDEFINATELY -continue NEB THERAPY as prescribed -morphine as needed -wean fio2 as needed and tolerated  END STAGE COR PULMONALE PULM HTN Needs VENT support and oxygen support   CARDIAC ICU monitoring  GI-will re-assess TF's today   DIET--> erythromycin for gut motility Constipation protocol as indicated  ENDO Re-started  Back on D5 infusion for  hypoglycemia  ELECTROLYTES -follow labs as needed -replace as needed -pharmacy consultation and following    DVT/GI PRX ordered TRANSFUSIONS AS NEEDED MONITOR FSBS ASSESS the need for LABS as needed      DVT/GI PRX ordered and assessed TRANSFUSIONS AS NEEDED MONITOR FSBS I Assessed the need for Labs I Assessed the need for Foley I Assessed the need for Central Venous Line Family Discussion when available I Assessed the need for Mobilization I made an Assessment of medications to be adjusted accordingly Safety Risk assessment completed  CASE DISCUSSED IN MULTIDISCIPLINARY ROUNDS WITH ICU TEAM    Critical Care Time devoted to patient care services described in this note is 32 minutes.   Overall, patient is critically ill, prognosis is guarded.     Corrin Parker, M.D.  [facility]  Medical Director [place] Director [facility] Cardio-Pulmonary Department

## 2019-07-15 NOTE — Progress Notes (Signed)
Physical Therapy Treatment Patient Details Name: Graeme Menees MRN: [id number] DOB: 1970-05-16 Today's Date: 07/15/2019    History of Present Illness Chris Case is a 50yoM who comes to [facility] on 4/13 after being found unresponsive. Pt here 29 May 2019 through 03 June 2019. Pt required ventilation upon arrival, failed extubation on 4/16, transitioned to tach on 4/20. Pt has required NGT decompression d/t ABD distension, underwent  PEG placement and lysis adhesions 4/22. 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 Pt has since been trialed on PMV but has not been off vent support enough long enough to trial much, limited tolerance to tach collor O2.    PT Comments    Pt was seated in recliner upon arriving. He agrees to PT session and is motivated throughout. Has been in chair for several hours and was agreeable to get back to bed. He reports minimal pain in L side. Pt on vent throughout session and vitals stable during session. Presented with less anxiety overall this date. He was able to stand 2 x from recliner with CGA + 1 UE HHA prior to standing and taking steps to EOB. No LOB or difficulty with transfers. He required min assist to progress BLEs into bed with therapist managing line/tubes. Tolerated bed level exercises. Pt requested to perform more activity however therapist concerned with over performance. OT entered room at conclusion of PT session. PT will continue to follow per current POC and progress as able per pt tolerance.     Follow Up Recommendations  LTACH;Supervision for mobility/OOB;Supervision/Assistance - 24 hour     Equipment Recommendations  Wheelchair (measurements PT);Wheelchair cushion (measurements PT);Hospital bed;3in1 (PT);Rolling walker with 5" wheels    Recommendations for Other Services       Precautions / Restrictions Precautions Precautions: Fall Precaution Comments: monitor O2, HR, and BP; trach collar vs  mechanical ventilation; HOB >30 degrees; NPO; PEG tube; R PICC line Restrictions Weight Bearing Restrictions: No    Mobility  Bed Mobility Overal bed mobility: Needs Assistance Bed Mobility: Sit to Supine       Sit to supine: Min assist   General bed mobility comments: Min assist for assisting BLes into bed while managing line/tubes  Transfers Overall transfer level: Needs assistance Equipment used: 1 person hand held assist Transfers: Sit to/from Stand Sit to Stand: Min guard         General transfer comment: Pt performed STS 3 x  throughout session with +1 LUE HHA. he stood 2 x prior to standing and taking steps to EOB from recliner. Vcs to slow down for managment of line. Overall demonstrates safe ability to transfer  Ambulation/Gait Ambulation/Gait assistance: Min guard Gait Distance (Feet): 3 Feet Assistive device: 1 person hand held assist Gait Pattern/deviations: WFL(Within Functional Limits)     General Gait Details: pt had no difficulty taking several steps from recliner to EOB. once seated EOB, pt requesting to ambulate in hallway. therapist explained concerns with over doing. Pt was on vent throughout session and therapist explained it would take severeal others to assist with ambulation while on vent. He states understanding. Explained to pt that performing standing exercises at EOB is just as benificial as ambulation in hallway.   Stairs             Wheelchair Mobility    Modified Rankin (Stroke Patients Only)       Balance  Cognition Arousal/Alertness: Awake/alert Behavior During Therapy: WFL for tasks assessed/performed Overall Cognitive Status: Within Functional Limits for tasks assessed                                 General Comments: Pt highly motivated and agrees to PT session with minimal encouragement. pt was seated in recliner requesting to get back to bed.       Exercises General Exercises - Lower Extremity Long Arc Quad: AROM;10 reps;Seated;Both Heel Slides: AROM;Strengthening;Both;10 reps;Supine Straight Leg Raises: AROM;Both;10 reps    General Comments        Pertinent Vitals/Pain Pain Assessment: 0-10 Pain Score: 2  Pain Location: L side  Pain Descriptors / Indicators: Sore;Tender Pain Intervention(s): Limited activity within patient's tolerance;Monitored during session    Home Living                      Prior Function            PT Goals (current goals can now be found in the care plan section) Acute Rehab PT Goals Patient Stated Goal: to go home Progress towards PT goals: Progressing toward goals    Frequency    Min 2X/week      PT Plan Current plan remains appropriate    Co-evaluation              AM-PAC PT "6 Clicks" Mobility   Outcome Measure  Help needed turning from your back to your side while in a flat bed without using bedrails?: A Little Help needed moving from lying on your back to sitting on the side of a flat bed without using bedrails?: A Little Help needed moving to and from a bed to a chair (including a wheelchair)?: A Little Help needed standing up from a chair using your arms (e.g., wheelchair or bedside chair)?: A Little Help needed to walk in hospital room?: A Little Help needed climbing 3-5 steps with a railing? : Total 6 Click Score: 16    End of Session Equipment Utilized During Treatment: Oxygen Activity Tolerance: Patient tolerated treatment well Patient left: with call bell/phone within reach;in chair;with chair alarm set Nurse Communication: Mobility status;Precautions PT Visit Diagnosis: Unsteadiness on feet (R26.81);Muscle weakness (generalized) (M62.81);History of falling (Z91.81);Other abnormalities of gait and mobility (R26.89)     Time: 7867-6720 PT Time Calculation (min) (ACUTE ONLY): 12 min  Charges:  $Therapeutic Activity: 8-22 mins                      Jetta Lout PTA 07/15/19, 4:38 PM

## 2019-07-15 NOTE — TOC Progression Note (Signed)
Transition of Care [facility]) - Progression Note    Patient Details  Name: Theophile Harvie MRN: [id number] Date of Birth: Nov 19, 1970  Transition of Care [facility]) CM/SW Contact  Liliana Cline, LCSW Phone Number: 07/15/2019, 12:43 PM  Clinical Narrative:   CSW spoke with patient at bedside to make sure he knows that if he goes to SNF, his Medicaid will only cover if he is there at least 30 days, and they would take his check while he is there (except for $30). Patient verbalized understanding. CSW will reach out to SNFs who offered beds to confirm that they are aware of patient being on vent with trach and then follow-up with patient about SNF options.     Expected Discharge Plan: Home w Home Health Services Barriers to Discharge: Continued Medical Work up  Expected Discharge Plan and Services Expected Discharge Plan: Home w Home Health Services     Post Acute Care Choice: Home Health Living arrangements for the past 2 months: Single Family Home                             HH Agency: [facility]     Representative spoke with at [facility] Agency: Feliberto Gottron   Social Determinants of Health (SDOH) Interventions    Readmission Risk Interventions Readmission Risk Prevention Plan 06/29/2019  Transportation Screening Complete  PCP or Specialist Appt within 3-5 Days Complete  HRI or Home Care Consult Complete  Palliative Care Screening Complete  Medication Review (RN Care Manager) Complete  Some recent data might be hidden

## 2019-07-16 DIAGNOSIS — Z515 Encounter for palliative care: Secondary | ICD-10-CM

## 2019-07-16 DIAGNOSIS — Z7189 Other specified counseling: Secondary | ICD-10-CM

## 2019-07-16 LAB — CBC WITH DIFFERENTIAL/PLATELET
Abs Immature Granulocytes: 0.05 10*3/uL (ref 0.00–0.07)
Basophils Absolute: 0 10*3/uL (ref 0.0–0.1)
Basophils Relative: 0 %
Eosinophils Absolute: 0.3 10*3/uL (ref 0.0–0.5)
Eosinophils Relative: 4 %
HCT: 27 % — ABNORMAL LOW (ref 39.0–52.0)
Hemoglobin: 8.7 g/dL — ABNORMAL LOW (ref 13.0–17.0)
Immature Granulocytes: 1 %
Lymphocytes Relative: 15 %
Lymphs Abs: 1 10*3/uL (ref 0.7–4.0)
MCH: 30.6 pg (ref 26.0–34.0)
MCHC: 32.2 g/dL (ref 30.0–36.0)
MCV: 95.1 fL (ref 80.0–100.0)
Monocytes Absolute: 0.9 10*3/uL (ref 0.1–1.0)
Monocytes Relative: 13 %
Neutro Abs: 4.6 10*3/uL (ref 1.7–7.7)
Neutrophils Relative %: 67 %
Platelets: 99 10*3/uL — ABNORMAL LOW (ref 150–400)
RBC: 2.84 MIL/uL — ABNORMAL LOW (ref 4.22–5.81)
RDW: 14.2 % (ref 11.5–15.5)
WBC: 6.9 10*3/uL (ref 4.0–10.5)
nRBC: 0 % (ref 0.0–0.2)

## 2019-07-16 LAB — BASIC METABOLIC PANEL
Anion gap: 8 (ref 5–15)
BUN: 10 mg/dL (ref 6–20)
CO2: 31 mmol/L (ref 22–32)
Calcium: 8.7 mg/dL — ABNORMAL LOW (ref 8.9–10.3)
Chloride: 99 mmol/L (ref 98–111)
Creatinine, Ser: 0.47 mg/dL — ABNORMAL LOW (ref 0.61–1.24)
GFR calc Af Amer: >60 mL/min (ref >60)
GFR calc non Af Amer: >60 mL/min (ref >60)
Glucose, Bld: 92 mg/dL (ref 70–99)
Potassium: 3.4 mmol/L — ABNORMAL LOW (ref 3.5–5.1)
Sodium: 138 mmol/L (ref 135–145)

## 2019-07-16 LAB — GLUCOSE, CAPILLARY
Glucose-Capillary: 90 mg/dL (ref 70–99)
Glucose-Capillary: 76 mg/dL (ref 70–99)
Glucose-Capillary: 69 mg/dL — ABNORMAL LOW (ref 70–99)
Glucose-Capillary: 73 mg/dL (ref 70–99)

## 2019-07-16 LAB — PHOSPHORUS: Phosphorus: 3.5 mg/dL (ref 2.5–4.6)

## 2019-07-16 LAB — MAGNESIUM: Magnesium: 1.6 mg/dL — ABNORMAL LOW (ref 1.7–2.4)

## 2019-07-16 MED ORDER — MAGNESIUM SULFATE 2 GM/50ML IV SOLN
2.0000 g | Freq: Once | INTRAVENOUS | Status: AC
  Administered 2019-07-16: 2 g via INTRAVENOUS
  Filled 2019-07-16: qty 50

## 2019-07-16 MED ORDER — DEXTROSE 50 % IV SOLN
12.5000 g | INTRAVENOUS | Status: AC
  Administered 2019-07-16: 12.5 g via INTRAVENOUS

## 2019-07-16 MED ORDER — OSMOLITE 1.5 CAL PO LIQD
1000.0000 mL | ORAL | Status: DC
  Administered 2019-07-16 – 2019-07-28 (×14): 1000 mL

## 2019-07-16 MED ORDER — POTASSIUM CHLORIDE 10 MEQ/50ML IV SOLN
10.0000 meq | INTRAVENOUS | Status: AC
  Administered 2019-07-16 (×3): 10 meq via INTRAVENOUS
  Filled 2019-07-16 (×3): qty 50

## 2019-07-16 MED ORDER — OXYCODONE-ACETAMINOPHEN 5-325 MG PO TABS
1.0000 | ORAL_TABLET | ORAL | Status: DC | PRN
  Administered 2019-07-17 – 2019-07-27 (×15): 1
  Filled 2019-07-16 (×15): qty 1

## 2019-07-16 MED ORDER — DEXTROSE 50 % IV SOLN
INTRAVENOUS | Status: AC
  Filled 2019-07-16: qty 50

## 2019-07-16 NOTE — Progress Notes (Signed)
Physical Therapy Treatment Patient Details Name: Chris Case MRN: [id number] DOB: Apr 14, 1970 Today's Date: 07/16/2019    History of Present Illness Chris Case is a 48yoM who comes to [facility] on 4/13 after being found unresponsive. Pt here 29 May 2019 through 03 June 2019. Pt required ventilation upon arrival, failed extubation on 4/16, transitioned to tach on 4/20. Pt has required NGT decompression d/t ABD distension, underwent  PEG placement and lysis adhesions 4/22. 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 Pt has since been trialed on PMV but has not been off vent support enough long enough to trial much, limited tolerance to tach collor O2.    PT Comments    Pt was seated in recliner upon arriving and agreeable to PT session. Cleared by RN to participate. Pt requested to ambulate when therapist asked how he was doing. Re-educated on concerns and inability to ambulate safely at this time with vent support and his ability to tolerate a lot of activity. He again states understanding.He stood 2 x from recliner performing standing exercises prior to requesting to get on/off BSC for BM. Pt does have small BM. While seated on BSC pt's WOB increased and RT called. RT increased pressure support and pt recovered quickly.He returned to bed and was repositioned. Overall pt tolerated session well but continues to be limited by respiratory status. Acute PT will continue to follow and progress as able. Recommend SNF/LTACH at DC.       Follow Up Recommendations  LTACH;SNF     Equipment Recommendations  Wheelchair (measurements PT);Wheelchair cushion (measurements PT);Hospital bed;3in1 (PT);Rolling walker with 5" wheels    Recommendations for Other Services       Precautions / Restrictions Precautions Precautions: Fall Precaution Comments: monitor O2, HR, and BP; trach collar vs mechanical ventilation; HOB >30 degrees; NPO; PEG tube; R PICC  line Restrictions Weight Bearing Restrictions: No    Mobility  Bed Mobility Overal bed mobility: Needs Assistance Bed Mobility: Sit to Supine       Sit to supine: Min assist   General bed mobility comments: incraesed time and min assist with LE progression into bed. therapist assissted throughout with line management  Transfers Overall transfer level: Needs assistance Equipment used: Rolling walker (2 wheeled) Transfers: Sit to/from Stand Sit to Stand: Supervision Stand pivot transfers: Supervision       General transfer comment: Pt was able to stand 2 x from recliner prior to stand pivot to BSC/ EOB. RT called while pt on BSC to increase PS per pt request. Vitakls were stable throughout but pt's anxiousness created increased WOB and fatigue. Once pt recieved more support, he requested to continue session however palliative arrived and PT was discontinued. Pt required assistance with hygene after use of BSC.   Ambulation/Gait Ambulation/Gait assistance: Supervision Gait Distance (Feet): 3 Feet Assistive device: Rolling walker (2 wheeled) Gait Pattern/deviations: Step-to pattern Gait velocity: decreased   General Gait Details: limited gait distance 2/2 to pt on vent support throughout.   Stairs             Wheelchair Mobility    Modified Rankin (Stroke Patients Only)       Balance                                            Cognition Arousal/Alertness: Awake/alert Behavior During Therapy:  WFL for tasks assessed/performed Overall Cognitive Status: Within Functional Limits for tasks assessed                                 General Comments: Therapist questions if pt fully understands limtations and respiratory concerns currently and going forward. Pt is highly motivated and agrees to PT session. Requested to ambulate, therapist re-educated him on limitations with being on vent      Exercises Other Exercises Other Exercises:  Pt was able to perform and tolerate standing heel raises and hip abduction 10 x BLEs in standing with prolonged seated rest between. Standing ther ex performed prior to getting on/off BSC.     General Comments        Pertinent Vitals/Pain Pain Assessment: No/denies pain Pain Score: 0-No pain    Home Living                      Prior Function            PT Goals (current goals can now be found in the care plan section) Acute Rehab PT Goals Patient Stated Goal: to go home Progress towards PT goals: Progressing toward goals    Frequency    Min 2X/week      PT Plan Current plan remains appropriate    Co-evaluation              AM-PAC PT "6 Clicks" Mobility   Outcome Measure  Help needed turning from your back to your side while in a flat bed without using bedrails?: A Little Help needed moving from lying on your back to sitting on the side of a flat bed without using bedrails?: A Little Help needed moving to and from a bed to a chair (including a wheelchair)?: A Little Help needed standing up from a chair using your arms (e.g., wheelchair or bedside chair)?: A Little Help needed to walk in hospital room?: A Lot Help needed climbing 3-5 steps with a railing? : Total 6 Click Score: 15    End of Session Equipment Utilized During Treatment: Oxygen;Gait belt;Other (comment)(Vent) Activity Tolerance: Patient tolerated treatment well Patient left: in bed;with call bell/phone within reach;with bed alarm set;with nursing/sitter in room Nurse Communication: Mobility status;Precautions PT Visit Diagnosis: Unsteadiness on feet (R26.81);Muscle weakness (generalized) (M62.81);History of falling (Z91.81);Other abnormalities of gait and mobility (R26.89)     Time: 1412-1500 PT Time Calculation (min) (ACUTE ONLY): 48 min  Charges:  $Therapeutic Exercise: 8-22 mins $Therapeutic Activity: 23-37 mins                     Jetta Lout PTA 07/16/19, 4:16 PM

## 2019-07-16 NOTE — Consult Note (Signed)
WOC Nurse Consult Note: Reason for Consult: Reconsulted for Stage 3 pressure injury to right buttock Wound type:Pressure  Patient was seen on 07/13/19 by my partner, M. Austin.  See her note from that encounter for POC, wound measurements and education provided.    WOC nurse will see weekly while in house.  WOC nursing team will follow, seeing weekly while in house and will remain available to this patient, the nursing and medical teams.   Thanks, Ladona Mow, MSN, RN, GNP, [place]  Pager# [phone]

## 2019-07-16 NOTE — Progress Notes (Signed)
PHARMACY CONSULT NOTE  Pharmacy Consult for Electrolyte Monitoring and Replacement   Recent Labs: Potassium (mmol/L)  Date Value  07/16/2019 3.4 (L)  09/24/2013 4.6   Magnesium (mg/dL)  Date Value  70/48/8891 1.6 (L)   Calcium (mg/dL)  Date Value  69/45/0388 8.7 (L)   Calcium, Total (mg/dL)  Date Value  82/80/0349 8.9   Albumin (g/dL)  Date Value  17/91/5056 3.5   Phosphorus (mg/dL)  Date Value  97/94/8016 3.5   Sodium (mmol/L)  Date Value  07/16/2019 138  09/24/2013 138    Assessment: 49 year old male admitted after being found unresponsive and admitted with end stage COPD. Patient was intubated in the ED and transferred to the ICU. Patient with trach and G-tube.  Electrolytes: Goal: electrolytes WNL, K ~4, Mg ~2   Potassium and Mg low this AM. Patient receiving KCl 10 mEq IVPB x 3 doses. Patient received Mg sulfate 2g IVPB x 1 dose. Will replace as indicated following AM labs.  Glucose: Following CBGs q4h. Patient restarted tube feeds yesterday and is tolerating them well and the D5 infusion was stopped this AM. Plan for nurse to continue q4h CBG checks. Will continue to monitor.  Constipation: LBM 5/12. Continue to use Senokot-S per tube BID PRN. Continue erythromycin 250 mg IVPB q6h for gut motility. No additional intervention needed at this time. Will continue to monitor.    Pharmacy will continue to monitor and adjust per consult.   Delfin Gant, PharmD Clinical Pharmacist 07/16/2019 11:20 AM

## 2019-07-16 NOTE — Progress Notes (Signed)
A&OX4. VSS this shift. Pt was deep suctioned as needed for thick tan secretions. Sat up in chair for 3 hours this shift. Tube feed and line changed. Trache and PEG tube intact. Will continue to monitor.

## 2019-07-16 NOTE — Consult Note (Signed)
Consultation Note Date: 07/16/2019   Patient Name: Chris Case  DOB: [birth year]  MRN: [id number]  Age / Sex: 49 y.o., male  PCP: [facility] Referring Physician: Erin Fulling, MD  Reason for Consultation: Establishing goals of care  HPI/Patient Profile: Patient is a 49 year old former smoker with a history very severe COPD stage IV, who presented via EMS after being found down and unresponsive.  Apparently the patient's mother placed a call to EMS.  The patient has a longstanding history of COPD and noncompliance with his oxygen and CPAP.  Clinical Assessment and Goals of Care: Patient is in bed on ventilator. He states he has a roommate. He has children and an older sister.   He states he did not want CPR or a ventilator, but states now that he has it, he needs to keep doing what he can.   We discussed his diagnosis, and prognosis.  A detailed discussion was had today regarding advanced directives.  Concepts specific to code status, artifical feeding and hydration, IV antibiotics and rehospitalization were discussed.  The difference between an aggressive medical intervention path and a comfort care path was discussed.  Values and goals of care important to patient and family were attempted to be elicited.  Discussed limitations of medical interventions to prolong quality of life in some situations and discussed the concept of human mortality.  He wants to continue to do what he can at this time besides CPR. He would like to try to get off the ventilator as able. He would like to try to learn how to clean his trach and do bolus feeds himself.   Recommend palliative at D/C.     SUMMARY OF RECOMMENDATIONS    Recommend palliative at D/C.   He would like to learn to clean trach/inner cannula, and administer bolus tube feeds himself.   Prognosis:   Unable to determine       Primary Diagnoses: Present on Admission: . Unresponsiveness   I have reviewed the medical record, interviewed the patient and family, and examined the patient. The following aspects are pertinent.  Past Medical History:  Diagnosis Date  . COPD (chronic obstructive pulmonary disease) (HCC)   . Emphysema lung (HCC)   . 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 Marital status: Married    Spouse name: Not on file  . Number of children: Not on file  . Years of education: Not on file  . Highest education level: Not on file  Occupational History  . Not on file  Tobacco Use  . Smoking status: Former Smoker    Packs/day: 3.00    Years: 20.00    Pack years: 60.00    Types: Cigarettes    Quit date: 04/11/2016    Years since quitting: 3.2  . Smokeless tobacco: Never Used  Substance and Sexual Activity  . Alcohol use: No    Comment: former drinker-1 pint of gin/week-quit 2015  .  Drug use: Yes    Types: Marijuana    Comment: one joint /day x 2 y  . Sexual activity: Yes  Other Topics Concern  . Not on file  Social History Narrative  . Not on file   Social Determinants of Health   Financial Resource Strain:   . Difficulty of Paying Living Expenses:   Food Insecurity:   . Worried About Programme researcher, broadcasting/film/video in the Last Year:   . Barista in the Last Year:   Transportation Needs:   . Freight forwarder (Medical):   Marland Kitchen Lack of Transportation (Non-Medical):   Physical Activity:   . Days of Exercise per Week:   . Minutes of Exercise per Session:   Stress:   . Feeling of Stress :   Social Connections:   . Frequency of Communication with Friends and Family:   . Frequency of Social Gatherings with Friends and Family:   . Attends Religious Services:   . Active Member of Clubs or Organizations:   . Attends Banker Meetings:   Marland Kitchen Marital Status:    Family History  Problem Relation Age of Onset  .  COPD Mother    Scheduled Meds: . ALPRAZolam  0.25 mg Per Tube TID  . budesonide (PULMICORT) nebulizer solution  0.5 mg Nebulization BID  . chlorhexidine  15 mL Mouth Rinse BID  . Chlorhexidine Gluconate Cloth  6 each Topical Daily  . enoxaparin (LOVENOX) injection  40 mg Subcutaneous Q24H  . famotidine  20 mg Per Tube BID  . ipratropium-albuterol  3 mL Nebulization Q4H  . mouth rinse  15 mL Mouth Rinse q12n4p  . melatonin  10 mg Per Tube QHS  . nutrition supplement (JUVEN)  1 packet Per Tube BID BM  . polyethylene glycol  17 g Per Tube Daily  . senna-docusate  2 tablet Per Tube BID  . sodium chloride flush  10-40 mL Intracatheter Q12H   Continuous Infusions: . sodium chloride Stopped (06/25/19 1712)  . dextrose Stopped (07/16/19 1053)  . erythromycin 250 mg (07/16/19 1405)  . feeding supplement (OSMOLITE 1.5 CAL) 1,000 mL (07/16/19 1221)   PRN Meds:.sodium chloride, acetaminophen, acetaminophen, ipratropium-albuterol, LORazepam, morphine injection, oxyCODONE-acetaminophen, senna-docusate, sodium chloride flush Medications Prior to Admission:  Prior to Admission medications   Medication Sig Start Date End Date Taking? Authorizing Provider  albuterol (PROVENTIL) (2.5 MG/3ML) 0.083% nebulizer solution Inhale 3 mLs into the lungs every 4 (four) hours as needed for wheezing or shortness of breath. 09/26/15  Yes Katharina Caper, MD  albuterol (VENTOLIN HFA) 108 (90 Base) MCG/ACT inhaler Inhale 2 puffs into the lungs every 4 (four) hours as needed for shortness of breath. 01/09/19  Yes [provider]  mometasone-formoterol (DULERA) 200-5 MCG/ACT AERO Inhale 2 puffs into the lungs 2 (two) times daily. 09/26/15  Yes Katharina Caper, MD  Tiotropium Bromide Monohydrate (SPIRIVA RESPIMAT) 1.25 MCG/ACT AERS Inhale 1 puff into the lungs daily.    Yes [provider]   No Known Allergies Review of Systems  All other systems reviewed and are negative.   Physical Exam Pulmonary:      Comments: On vent Neurological:     Mental Status: He is alert.     Vital Signs: BP 111/80   Pulse 71   Temp 98.2 F (36.8 C) (Oral)   Resp 15   Ht 5\' 6"  (1.676 m)   Wt 55 kg   SpO2 98%   BMI 19.57 kg/m  Pain Scale:  0-10   Pain Score: 3    SpO2: SpO2: 98 % O2 Device:SpO2: 98 % O2 Flow Rate: .O2 Flow Rate (L/min): 2 L/min  IO: Intake/output summary:   Intake/Output Summary (Last 24 hours) at 07/16/2019 1557 Last data filed at 07/16/2019 1146 Gross per 24 hour  Intake 1270.66 ml  Output 1350 ml  Net -79.34 ml    LBM: Last BM Date: 07/14/19 Baseline Weight: Weight: 55.2 kg Most recent weight: Weight: 55 kg     Palliative Assessment/Data:   Flowsheet Rows     Most Recent Value  Intake Tab  Referral Department  Critical care  Unit at Time of Referral  ICU  Date Notified  06/16/19  Palliative Care Type  Not seen  Reason Not Seen  Consult cancelled  Reason for referral  Clarify Goals of Care  Date of Admission  06/15/19  # of days IP prior to Palliative referral  1  Clinical Assessment  Psychosocial & Spiritual Assessment  Palliative Care Outcomes      Time In: 2:50 Time Out: 4:05 Time Total: 1 hour 15 min Greater than 50%  of this time was spent counseling and coordinating care related to the above assessment and plan.  Signed by: Asencion Gowda, NP   Please contact Palliative Medicine Team phone at [phone] for questions and concerns.  For individual provider: See Shea Evans

## 2019-07-16 NOTE — TOC Progression Note (Signed)
Transition of Care [facility]) - Progression Note    Patient Details  Name: Chris Case MRN: [id number] Date of Birth: Nov 26, 1970  Transition of [facility] CM/SW Contact  Liliana Cline, LCSW Phone Number: 07/16/2019, 1:05 PM  Clinical Narrative:   Patient on continuous vent so will have to have a Vent SNF.   CSW called Vent SNFs in [place] (per [facility] website)  [facility]- Left a voicemail for Admissions Worker Prem  [facility]- Left voicemail for Admissions Worker Coralee North.  [facility]- Spoke with Admissions Worker Clydie Braun. Clydie Braun reported they are able to take patients on a continuous vent. She reported they may have a bed opening up next week. She reported patient having Medicaid would not be a barrier. CSW faxed patient's information to Clydie Braun for her to review.   CSW also called [facility] (not on [facility] list). Spoke to [facility] who reported they cannot take patient on a vent. Only if patient is on a Trilogy in CPAP mode. Dr. Belia Heman confirmed patient would not be able to tolerate this.     Expected Discharge Plan: Home w Home Health Services Barriers to Discharge: Continued Medical Work up  Expected Discharge Plan and Services Expected Discharge Plan: Home w Home Health Services     Post Acute Care Choice: Home Health Living arrangements for the past 2 months: Single Family Home                             HH Agency: [facility]     Representative spoke with at [facility] Agency: Feliberto Gottron   Social Determinants of Health (SDOH) Interventions    Readmission Risk Interventions Readmission Risk Prevention Plan 06/29/2019  Transportation Screening Complete  PCP or Specialist Appt within 3-5 Days Complete  HRI or Home Care Consult Complete  Palliative Care Screening Complete  Medication Review (RN Care Manager) Complete  Some recent data might be hidden

## 2019-07-17 LAB — CBC WITH DIFFERENTIAL/PLATELET
Abs Immature Granulocytes: 0.05 10*3/uL (ref 0.00–0.07)
Basophils Absolute: 0 10*3/uL (ref 0.0–0.1)
Basophils Relative: 0 %
Eosinophils Absolute: 0.3 10*3/uL (ref 0.0–0.5)
Eosinophils Relative: 4 %
HCT: 28.4 % — ABNORMAL LOW (ref 39.0–52.0)
Hemoglobin: 9 g/dL — ABNORMAL LOW (ref 13.0–17.0)
Immature Granulocytes: 1 %
Lymphocytes Relative: 14 %
Lymphs Abs: 1 10*3/uL (ref 0.7–4.0)
MCH: 30.6 pg (ref 26.0–34.0)
MCHC: 31.7 g/dL (ref 30.0–36.0)
MCV: 96.6 fL (ref 80.0–100.0)
Monocytes Absolute: 0.9 10*3/uL (ref 0.1–1.0)
Monocytes Relative: 13 %
Neutro Abs: 4.8 10*3/uL (ref 1.7–7.7)
Neutrophils Relative %: 68 %
Platelets: 100 10*3/uL — ABNORMAL LOW (ref 150–400)
RBC: 2.94 MIL/uL — ABNORMAL LOW (ref 4.22–5.81)
RDW: 14.3 % (ref 11.5–15.5)
WBC: 7.1 10*3/uL (ref 4.0–10.5)
nRBC: 0 % (ref 0.0–0.2)

## 2019-07-17 LAB — BASIC METABOLIC PANEL
Anion gap: 7 (ref 5–15)
BUN: 15 mg/dL (ref 6–20)
CO2: 32 mmol/L (ref 22–32)
Calcium: 8.7 mg/dL — ABNORMAL LOW (ref 8.9–10.3)
Chloride: 97 mmol/L — ABNORMAL LOW (ref 98–111)
Creatinine, Ser: 0.4 mg/dL — ABNORMAL LOW (ref 0.61–1.24)
GFR calc Af Amer: >60 mL/min (ref >60)
GFR calc non Af Amer: >60 mL/min (ref >60)
Glucose, Bld: 88 mg/dL (ref 70–99)
Potassium: 4.1 mmol/L (ref 3.5–5.1)
Sodium: 136 mmol/L (ref 135–145)

## 2019-07-17 LAB — MAGNESIUM: Magnesium: 2 mg/dL (ref 1.7–2.4)

## 2019-07-17 LAB — GLUCOSE, CAPILLARY
Glucose-Capillary: 101 mg/dL — ABNORMAL HIGH (ref 70–99)
Glucose-Capillary: 112 mg/dL — ABNORMAL HIGH (ref 70–99)
Glucose-Capillary: 114 mg/dL — ABNORMAL HIGH (ref 70–99)
Glucose-Capillary: 124 mg/dL — ABNORMAL HIGH (ref 70–99)
Glucose-Capillary: 83 mg/dL (ref 70–99)
Glucose-Capillary: 86 mg/dL (ref 70–99)
Glucose-Capillary: 87 mg/dL (ref 70–99)

## 2019-07-17 LAB — PHOSPHORUS: Phosphorus: 4.2 mg/dL (ref 2.5–4.6)

## 2019-07-17 MED ORDER — AMOXICILLIN-POT CLAVULANATE 875-125 MG PO TABS
1.0000 | ORAL_TABLET | Freq: Two times a day (BID) | ORAL | Status: AC
  Administered 2019-07-17 – 2019-07-22 (×10): 1
  Filled 2019-07-17 (×10): qty 1

## 2019-07-17 MED ORDER — PREDNISONE 20 MG PO TABS
40.0000 mg | ORAL_TABLET | Freq: Every day | ORAL | Status: DC
  Administered 2019-07-18: 40 mg via ORAL
  Filled 2019-07-17: qty 2

## 2019-07-17 NOTE — Progress Notes (Signed)
Resumed full PCV support at previous settings due to patient fatigue. RN aware, patient calming once back on full support.

## 2019-07-17 NOTE — Progress Notes (Signed)
Patient became SOB with suctioning and requested to be returned to previous PCV mode.

## 2019-07-17 NOTE — Progress Notes (Signed)
PHARMACY CONSULT NOTE  Pharmacy Consult for Electrolyte Monitoring and Replacement   Recent Labs: Potassium (mmol/L)  Date Value  07/17/2019 4.1  09/24/2013 4.6   Magnesium (mg/dL)  Date Value  91/63/8466 2.0   Calcium (mg/dL)  Date Value  59/93/5701 8.7 (L)   Calcium, Total (mg/dL)  Date Value  77/93/9030 8.9   Albumin (g/dL)  Date Value  11/24/3005 3.5   Phosphorus (mg/dL)  Date Value  62/26/3335 4.2   Sodium (mmol/L)  Date Value  07/17/2019 136  09/24/2013 138   Corrected Ca: 9.1 mg/dL  Assessment: 49 year old male admitted after being found unresponsive and admitted with end stage COPD. Patient was intubated in the ED and transferred to the ICU. Patient with trach and G-tube.  Electrolytes: Goal: electrolytes WNL, K ~4, Mg ~2  No additional electrolyte supplementation required today Will replace as indicated following AM labs.  Glucose: Following CBGs q4h Patient restarted tube feeds 5/13  D5 infusion at 25 mL/hr Will continue to monitor.  Constipation: LBM 5/15  Continue to use Senokot-S per tube BID PRN Continue erythromycin 250 mg IVPB q6h for gut motility No additional intervention needed at this time Will continue to monitor.   Pharmacy will continue to monitor and adjust per consult.   Lowella Bandy, PharmD Clinical Pharmacist 07/17/2019 11:02 AM

## 2019-07-17 NOTE — Progress Notes (Signed)
Pt declined a bath tonight

## 2019-07-17 NOTE — Progress Notes (Signed)
Pt req to remain on PCV for tonite

## 2019-07-18 LAB — CBC WITH DIFFERENTIAL/PLATELET
Abs Immature Granulocytes: 0.06 10*3/uL (ref 0.00–0.07)
Basophils Absolute: 0 10*3/uL (ref 0.0–0.1)
Basophils Relative: 0 %
Eosinophils Absolute: 0.4 10*3/uL (ref 0.0–0.5)
Eosinophils Relative: 4 %
HCT: 27.3 % — ABNORMAL LOW (ref 39.0–52.0)
Hemoglobin: 8.9 g/dL — ABNORMAL LOW (ref 13.0–17.0)
Immature Granulocytes: 1 %
Lymphocytes Relative: 12 %
Lymphs Abs: 1 10*3/uL (ref 0.7–4.0)
MCH: 31 pg (ref 26.0–34.0)
MCHC: 32.6 g/dL (ref 30.0–36.0)
MCV: 95.1 fL (ref 80.0–100.0)
Monocytes Absolute: 0.9 10*3/uL (ref 0.1–1.0)
Monocytes Relative: 11 %
Neutro Abs: 6.1 10*3/uL (ref 1.7–7.7)
Neutrophils Relative %: 72 %
Platelets: 93 10*3/uL — ABNORMAL LOW (ref 150–400)
RBC: 2.87 MIL/uL — ABNORMAL LOW (ref 4.22–5.81)
RDW: 14.3 % (ref 11.5–15.5)
WBC: 8.4 10*3/uL (ref 4.0–10.5)
nRBC: 0 % (ref 0.0–0.2)

## 2019-07-18 LAB — BASIC METABOLIC PANEL
Anion gap: 5 (ref 5–15)
BUN: 17 mg/dL (ref 6–20)
CO2: 35 mmol/L — ABNORMAL HIGH (ref 22–32)
Calcium: 8.5 mg/dL — ABNORMAL LOW (ref 8.9–10.3)
Chloride: 96 mmol/L — ABNORMAL LOW (ref 98–111)
Creatinine, Ser: 0.45 mg/dL — ABNORMAL LOW (ref 0.61–1.24)
GFR calc Af Amer: >60 mL/min (ref >60)
GFR calc non Af Amer: >60 mL/min (ref >60)
Glucose, Bld: 118 mg/dL — ABNORMAL HIGH (ref 70–99)
Potassium: 4.4 mmol/L (ref 3.5–5.1)
Sodium: 136 mmol/L (ref 135–145)

## 2019-07-18 LAB — GLUCOSE, CAPILLARY
Glucose-Capillary: 95 mg/dL (ref 70–99)
Glucose-Capillary: 121 mg/dL — ABNORMAL HIGH (ref 70–99)
Glucose-Capillary: 89 mg/dL (ref 70–99)
Glucose-Capillary: 134 mg/dL — ABNORMAL HIGH (ref 70–99)
Glucose-Capillary: 154 mg/dL — ABNORMAL HIGH (ref 70–99)

## 2019-07-18 LAB — PHOSPHORUS: Phosphorus: 3.5 mg/dL (ref 2.5–4.6)

## 2019-07-18 LAB — MAGNESIUM: Magnesium: 1.7 mg/dL (ref 1.7–2.4)

## 2019-07-18 MED ORDER — FENTANYL CITRATE (PF) 100 MCG/2ML IJ SOLN: 100.0000 ug | Freq: Once | INTRAMUSCULAR | Status: AC

## 2019-07-18 MED ORDER — FENTANYL CITRATE (PF) 100 MCG/2ML IJ SOLN
INTRAMUSCULAR | Status: AC
  Administered 2019-07-18: 100 ug via INTRAVENOUS
  Filled 2019-07-18: qty 2

## 2019-07-18 MED ORDER — MIDAZOLAM HCL 2 MG/2ML IJ SOLN
INTRAMUSCULAR | Status: AC
  Filled 2019-07-18: qty 4

## 2019-07-18 MED ORDER — VECURONIUM BROMIDE 10 MG IV SOLR
INTRAVENOUS | Status: AC
  Administered 2019-07-18: 10 mg via INTRAVENOUS
  Filled 2019-07-18: qty 10

## 2019-07-18 MED ORDER — MAGNESIUM SULFATE 2 GM/50ML IV SOLN
2.0000 g | Freq: Once | INTRAVENOUS | Status: AC
  Administered 2019-07-18: 2 g via INTRAVENOUS
  Filled 2019-07-18: qty 50

## 2019-07-18 MED ORDER — GUAIFENESIN 100 MG/5ML PO SOLN
10.0000 mL | Freq: Four times a day (QID) | ORAL | Status: DC | PRN
  Administered 2019-07-18 – 2019-07-27 (×4): 200 mg
  Filled 2019-07-18 (×8): qty 10

## 2019-07-18 MED ORDER — ESCITALOPRAM OXALATE 10 MG PO TABS
10.0000 mg | ORAL_TABLET | Freq: Every day | ORAL | Status: DC
  Administered 2019-07-19 – 2019-07-29 (×11): 10 mg
  Filled 2019-07-18 (×11): qty 1

## 2019-07-18 MED ORDER — VECURONIUM BROMIDE 10 MG IV SOLR: 10.0000 mg | Freq: Once | INTRAVENOUS | Status: AC

## 2019-07-18 MED ORDER — MIDAZOLAM HCL 2 MG/2ML IJ SOLN
4.0000 mg | Freq: Once | INTRAMUSCULAR | Status: AC
  Administered 2019-07-18: 4 mg via INTRAVENOUS

## 2019-07-18 MED ORDER — GUAIFENESIN 100 MG/5ML PO SOLN
10.0000 mL | Freq: Four times a day (QID) | ORAL | Status: DC | PRN
  Filled 2019-07-18: qty 10

## 2019-07-18 MED ORDER — ESCITALOPRAM OXALATE 10 MG PO TABS
10.0000 mg | ORAL_TABLET | Freq: Every day | ORAL | Status: DC
  Administered 2019-07-18: 10 mg via ORAL
  Filled 2019-07-18: qty 1

## 2019-07-18 MED ORDER — PREDNISONE 20 MG PO TABS
40.0000 mg | ORAL_TABLET | Freq: Every day | ORAL | Status: AC
  Administered 2019-07-19 – 2019-07-22 (×4): 40 mg
  Filled 2019-07-18 (×4): qty 2

## 2019-07-18 NOTE — Progress Notes (Signed)
PHARMACY CONSULT NOTE  Pharmacy Consult for Electrolyte Monitoring and Replacement   Recent Labs: Potassium (mmol/L)  Date Value  07/18/2019 4.4  09/24/2013 4.6   Magnesium (mg/dL)  Date Value  32/44/0102 1.7   Calcium (mg/dL)  Date Value  72/53/6644 8.5 (L)   Calcium, Total (mg/dL)  Date Value  03/47/4259 8.9   Albumin (g/dL)  Date Value  56/38/7564 3.5   Phosphorus (mg/dL)  Date Value  33/29/5188 3.5   Sodium (mmol/L)  Date Value  07/18/2019 136  09/24/2013 138   Corrected Ca: 8.9 mg/dL  Assessment: 49 year old male admitted after being found unresponsive and admitted with end stage COPD. Patient was intubated in the ED and transferred to the ICU. Patient with trach and G-tube.  Electrolytes: Goal: electrolytes WNL, K ~4, Mg ~2  2 grams IV magnesium sulfate x 1 Will replace as indicated following AM labs.  Glucose: Following CBGs q4h Patient restarted tube feeds 5/13  D5 infusion at 25 mL/hr Will continue to monitor  Constipation: LBM 5/15  Continue to use Senokot-S per tube BID PRN Continue erythromycin 250 mg IVPB q6h for gut motility No additional intervention needed at this time Will continue to monitor.   Pharmacy will continue to monitor and adjust per consult.   Lowella Bandy, PharmD Clinical Pharmacist 07/18/2019 7:01 AM

## 2019-07-18 NOTE — Progress Notes (Signed)
Patient became tachypneic, increased work of breathing, extremely anxious.  RT and nursing staff at bedside.  MD placed orders for sedative and paralytic medications to relax the airway.  Pt on pressure control Vent support.  Medications administered and pt status improved.  Will continue to monitor.

## 2019-07-18 NOTE — Progress Notes (Signed)
Pt recovered from morning episode of respiratory distress.  Tolerating current vent settings well.  Occasionally reports chest congestion and requests suctioning.  Started PRN doses of decongestant to thin secretions.  Changed PICC line dressing.

## 2019-07-18 NOTE — Progress Notes (Signed)
CRITICAL CARE NOTE 49 year old former smoker with a history very severe COPD stage IV, who presented via EMS after being found down and unresponsive. Apparently the patient's mother placed a call to EMS. The patient has a longstanding history of COPD and noncompliance with his oxygen and CPAP. Multiple admission for COPD Of note he had recently been admitted to [facility] 29 May 2019 through 03 June 2019. He had been evaluated by palliative care during this admission and actually had a DNR order. Patient also had a 2D echo with pulmonary hypertension consistent with cor pulmonale.  Records review: Patient previously followed with Dr. Ned Clines at [facility]. Last known PFTs 11 September 2017. FEV1 0.52 L or 20% predicted, FVC 1.49 L or 45% of predicted, FEV1/FVC 35%. FEF 25-75 6%. DLCO was 33%. This is consistent with very severe COPD.  Events: 4/13 admitted for severe resp failure from COPD 4/14 remains critically ill on vent 4/15 remains critically ill, severe COPD, failed SAT/SBT 4/16 extubated yesterday, had to be reintubated remains on ventilator 4/17 sedated on the ventilator 4/18 abdominal distention, CT abdomen and pelvis consistent with ileus and constipation 4/19-patietn remains on ventialtor awainting trach 4/20 - patient is on vent with trach, he had episodes of aggitation today but eventually improved  4/21 - patient with significant distension of abdomen, concern for aerophagia in NIV. There is mucopurulent exudation from trache this was cultures and patient placed on Unasyn for tracheitis 06/24/19 -trache site improved with less malodorous pus draining, NGT with decompression worked well abdomen is less distended. S/p PEG 06/25/19-patient had some anxiety throughout day today, we have started low dose anxiolytic. His PEG is now being utilized. He has some placement issues with social work due to insurance and we are working on this now.  06/26/19-Patient had no events  today, we are waiting for placement now from transitional care team 06/27/19- Patient is calm this morining, abdomen is mildy distended, awaiting placement to [facility] 4/26 plan for PS/TCT 4/27 Pt failed trach collar twice today. Became visibly distressed, diaphoretic, o2 drops, and accessory muscle use.  4/28increased WOB, resp distress, using accessory muscles to breath while on vent 4/29 patient placed on PS mode, given BD therapy, alert and awake, was able to communicate with PM valve 07/02/19-trache changed to cuffed size 6 without incident, patient reports more comfort with improved vocalization.  07/03/19- no overnight events, patient sitting up in bed in no distress.  5/3severe hypoxia and increased WOB, placed back on full vent support 5/4 severe resp distress, can NOT tolerate PS mode, severe resp failure End stage COPD 5/5 severe resp distress, can not tolerate TCT, severe end stage COPD 5/7sitting up in chair, able to do some pressure support trials but no trach collar trials. Still appears very tachypneic 5/8 cannot tolerate trach collar, few hours of PS only 5/9remains vent dependent. Minimal progress with weaning 5/10 increased WOB near cardiac arrest, distended abd 5/12 on PS mode 5/14 back on PC mode, very high levels of anxiety CC  follow up respiratory failure  SUBJECTIVE Patient remains critically ill Prognosis is guarded increased WOB and using accessory muscles to breathe Placed back on full vent support     BP 93/72   Pulse 73   Temp 97.7 F (36.5 C) (Axillary)   Resp 15   Ht 5' 5.98" (1.676 m)   Wt 55.5 kg   SpO2 100%   BMI 19.76 kg/m    I/O last 3 completed shifts: In: 2655.6 [I.V.:887.2; NG/GT:1425; IV  Piggyback:343.4] Out: 1575 [Urine:1575] No intake/output data recorded.  SpO2: 100 % O2 Flow Rate (L/min): 2 L/min FiO2 (%): 30 %  Estimated body mass index is 19.76 kg/m as calculated from the following:   Height as of this encounter: 5'  5.98" (1.676 m).   Weight as of this encounter: 55.5 kg.  SIGNIFICANT EVENTS   REVIEW OF SYSTEMS  PATIENT IS UNABLE TO PROVIDE COMPLETE REVIEW OF SYSTEMS DUE TO SEVERE CRITICAL ILLNESS   Pressure Injury 06/24/19 Buttocks Right Stage 3 -  Full thickness tissue loss. Subcutaneous fat may be visible but bone, tendon or muscle are NOT exposed. (Active)  06/24/19 0200  Location: Buttocks  Location Orientation: Right  Staging: Stage 3 -  Full thickness tissue loss. Subcutaneous fat may be visible but bone, tendon or muscle are NOT exposed.  Wound Description (Comments):   Present on Admission: No      PHYSICAL EXAMINATION:  GENERAL:critically ill appearing, +resp distress HEAD: Normocephalic, atraumatic.  EYES: Pupils equal, round, reactive to light.  No scleral icterus.  MOUTH: Moist mucosal membrane. NECK: Supple.  PULMONARY: +rhonchi, +wheezing CARDIOVASCULAR: S1 and S2. Regular rate and rhythm. No murmurs, rubs, or gallops.  GASTROINTESTINAL: Soft, nontender, -distended.  Positive bowel sounds.   MUSCULOSKELETAL: No swelling, clubbing, or edema.  NEUROLOGIC: obtunded, GCS<8 SKIN:intact,warm,dry  MEDICATIONS: I have reviewed all medications and confirmed regimen as documented   CULTURE RESULTS   No results found for this or any previous visit (from the past 240 hour(s)).        IMAGING    No results found.   Nutrition Status: Nutrition Problem: Severe Malnutrition Etiology: chronic illness(COPD) Signs/Symptoms: severe fat depletion, severe muscle depletion Interventions: Refer to RD note for recommendations     Indwelling Urinary Catheter continued, requirement due to   Reason to continue Indwelling Urinary Catheter strict Intake/Output monitoring for hemodynamic instability   Central Line/ continued, requirement due to  Reason to continue [employer] Monitoring of central venous pressure or other hemodynamic parameters and poor IV access   Ventilator  continued, requirement due to severe respiratory failure   Ventilator Sedation RASS 0 to -2      ASSESSMENT AND PLAN SYNOPSIS   Severe ACUTE Hypoxic and Hypercapnic Respiratory Failure due to end stage COPD, acute resp distress this AM Placed back on full vent support -continue Full MV support -continue Bronchodilator Therapy -Wean Fio2 and PEEP as tolerated -VAP/VENT bundle implementation  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   SEVERE COPD EXACERBATION -started  steroids as prescribed -continue NEB THERAPY as prescribed -morphine as needed -wean fio2 as needed and tolerated  abx started   NEUROLOGY Severe anxiety   CARDIAC ICU monitoring  ID  abx as prescibed -follow up cultures  GI GI PROPHYLAXIS as indicated  NUTRITIONAL STATUS Nutrition Status: Nutrition Problem: Severe Malnutrition Etiology: chronic illness(COPD) Signs/Symptoms: severe fat depletion, severe muscle depletion Interventions: Refer to RD note for recommendations   DIET-->TF's as tolerated Constipation protocol as indicated  ENDO - will use ICU hypoglycemic\Hyperglycemia protocol if indicated     ELECTROLYTES -follow labs as needed -replace as needed -pharmacy consultation and following   DVT/GI PRX ordered and assessed TRANSFUSIONS AS NEEDED MONITOR FSBS I Assessed the need for Labs I Assessed the need for Foley I Assessed the need for Central Venous Line Family Discussion when available I Assessed the need for Mobilization I made an Assessment of medications to be adjusted accordingly Safety Risk assessment completed   CASE DISCUSSED IN  MULTIDISCIPLINARY ROUNDS WITH ICU TEAM  Critical Care Time devoted to patient care services described in this note is 32 minutes.   Overall, patient is critically ill, prognosis is guarded.   PROGNOSIS IS POOR PATIENT IS DNR   Corrin Parker, M.D.  [facility]  Medical Director [place] Director [facility] Cardio-Pulmonary Department

## 2019-07-18 NOTE — Progress Notes (Signed)
CH visited pt. as follow-up from previous visits and per RN referral. Pt. sitting up in bed watching TV; vent set to offer more breathing support, making pt.'s speech somewhat difficult.  Pt. shared he had had a good day --sat in chair w/vent on lower setting for much of the day.  Eventually had to have vent pressure increased after having secretions suctioned, he said.  Pt. said he wished the team would allow him to go back to the lower pressure setting after he'd caught his breath in times when he gets tired; he feels like he could 'do OK' if allowed to try going back down to lower pressure.  Pt. knows SW team are looking for SNF/rehab placement for him; he would prefer to be close to his sister in [place], he said, but remains open to what is available.  Pt. visibly fatigued by talking --CH offered prayer for pt. and left him to rest.  Will continue to follow and monitor pt. needs.    07/17/19 0830  Clinical Encounter Type  Visited With Patient;Health care provider  Visit Type Follow-up;Psychological support;Spiritual support;Social support;Critical Care  Referral From Nurse  Spiritual Encounters  Spiritual Needs Emotional;Prayer  Stress Factors  Patient Stress Factors Health changes;Major life changes

## 2019-07-19 LAB — CBC WITH DIFFERENTIAL/PLATELET
Abs Immature Granulocytes: 0.05 10*3/uL (ref 0.00–0.07)
Basophils Absolute: 0 10*3/uL (ref 0.0–0.1)
Basophils Relative: 0 %
Eosinophils Absolute: 0 10*3/uL (ref 0.0–0.5)
Eosinophils Relative: 0 %
HCT: 29.4 % — ABNORMAL LOW (ref 39.0–52.0)
Hemoglobin: 9.4 g/dL — ABNORMAL LOW (ref 13.0–17.0)
Immature Granulocytes: 1 %
Lymphocytes Relative: 6 %
Lymphs Abs: 0.6 10*3/uL — ABNORMAL LOW (ref 0.7–4.0)
MCH: 30.2 pg (ref 26.0–34.0)
MCHC: 32 g/dL (ref 30.0–36.0)
MCV: 94.5 fL (ref 80.0–100.0)
Monocytes Absolute: 1 10*3/uL (ref 0.1–1.0)
Monocytes Relative: 11 %
Neutro Abs: 7.6 10*3/uL (ref 1.7–7.7)
Neutrophils Relative %: 82 %
Platelets: 113 10*3/uL — ABNORMAL LOW (ref 150–400)
RBC: 3.11 MIL/uL — ABNORMAL LOW (ref 4.22–5.81)
RDW: 13.9 % (ref 11.5–15.5)
WBC: 9.3 10*3/uL (ref 4.0–10.5)
nRBC: 0 % (ref 0.0–0.2)

## 2019-07-19 LAB — MAGNESIUM: Magnesium: 1.9 mg/dL (ref 1.7–2.4)

## 2019-07-19 LAB — BASIC METABOLIC PANEL
Anion gap: 9 (ref 5–15)
BUN: 20 mg/dL (ref 6–20)
CO2: 34 mmol/L — ABNORMAL HIGH (ref 22–32)
Calcium: 8.9 mg/dL (ref 8.9–10.3)
Chloride: 93 mmol/L — ABNORMAL LOW (ref 98–111)
Creatinine, Ser: 0.37 mg/dL — ABNORMAL LOW (ref 0.61–1.24)
GFR calc Af Amer: >60 mL/min (ref >60)
GFR calc non Af Amer: >60 mL/min (ref >60)
Glucose, Bld: 132 mg/dL — ABNORMAL HIGH (ref 70–99)
Potassium: 4.8 mmol/L (ref 3.5–5.1)
Sodium: 136 mmol/L (ref 135–145)

## 2019-07-19 LAB — GLUCOSE, CAPILLARY
Glucose-Capillary: 121 mg/dL — ABNORMAL HIGH (ref 70–99)
Glucose-Capillary: 128 mg/dL — ABNORMAL HIGH (ref 70–99)
Glucose-Capillary: 129 mg/dL — ABNORMAL HIGH (ref 70–99)
Glucose-Capillary: 135 mg/dL — ABNORMAL HIGH (ref 70–99)
Glucose-Capillary: 157 mg/dL — ABNORMAL HIGH (ref 70–99)

## 2019-07-19 LAB — PHOSPHORUS: Phosphorus: 2.7 mg/dL (ref 2.5–4.6)

## 2019-07-19 NOTE — Progress Notes (Signed)
PHARMACY CONSULT NOTE  Pharmacy Consult for Electrolyte Monitoring and Replacement   Recent Labs: Potassium (mmol/L)  Date Value  07/19/2019 4.8  09/24/2013 4.6   Magnesium (mg/dL)  Date Value  89/16/9450 1.9   Calcium (mg/dL)  Date Value  38/88/2800 8.9   Calcium, Total (mg/dL)  Date Value  34/91/7915 8.9   Albumin (g/dL)  Date Value  05/69/7948 3.5   Phosphorus (mg/dL)  Date Value  01/65/5374 2.7   Sodium (mmol/L)  Date Value  07/19/2019 136  09/24/2013 138   Corrected Ca: 9.3 mg/dL  Assessment: 49 year old male admitted after being found unresponsive and admitted with end stage COPD. Patient was intubated in the ED and transferred to the ICU. Patient with trach and G-tube.  Electrolytes: Goal: electrolytes WNL, K ~4, Mg ~2  No replacement needed at this time Will replace as indicated following AM labs  Glucose: Following CBGs q4h Patient restarted tube feeds 5/13  D5 infusion at 25 mL/hr Will continue to monitor  Constipation: LBM 5/16  Continue to use Senokot-S per tube BID PRN Stopped erythromycin 5/15 No additional intervention needed at this time Will continue to monitor  Pharmacy will continue to monitor and adjust per consult.   Delfin Gant, PharmD Clinical Pharmacist 07/19/2019 1:08 PM

## 2019-07-19 NOTE — Progress Notes (Signed)
CRITICAL CARE NOTE  49 year old former smoker with a history very severe COPD stage IV, who presented via EMS after being found down and unresponsive.  Apparently the patient's mother placed a call to EMS.  The patient has a longstanding history of COPD and noncompliance with his oxygen and CPAP.  History could not be obtained from the patient as there was no family available and the patient had been intubated and was mechanically ventilated in the ED.  Of note he had recently been admitted to [facility] 29 May 2019 through 03 June 2019.  He had been evaluated by palliative care during this admission and actually had a DNR order.   Patient also had a 2D echo  with pulmonary hypertension consistent with cor pulmonale.  Records review: Patient has been following with Dr. Ned Clines at [facility].  Last known PFTs 11 September 2017.  FEV1 0.52 L or 20% predicted, FVC 1.49 L or 45% of predicted, FEV1/FVC 35%.  FEF 25-75 6%.  DLCO was 33%.  This is consistent with very severe COPD.  Events: 4/13 admitted for severe resp failure from COPD 4/14 remains critically ill on vent 4/15 remains critically ill, severe COPD, failed SAT/SBT 4/16 extubated yesterday, had to be reintubated remains on ventilator 4/17 sedated on the ventilator 4/18 abdominal distention, CT abdomen and pelvis consistent with ileus and constipation 4/19-patietn remains on ventialtor awainting trach 4/20 - patient is on vent with trach, he had episodes of aggitation today but eventually improved  4/21 - patient with significant distension of abdomen, concern for aerophagia in NIV.  There is mucopurulent exudation from trache this was cultures and patient placed on Unasyn for tracheitis 06/24/19 -trache site improved with less malodorous pus draining, NGT with decompression worked well abdomen is less distended. S/p PEG 06/25/19- patient had some anxiety throughout day today, we have started low dose anxiolytic. His PEG is now being  utilized. He has some placement issues with social work due to insurance and we are working on this now.  06/26/19-Patient had no events today, we are waiting for placement now from transitional care team 06/27/19- Patient is calm this morining, abdomen is mildy distended, awaiting placement to [facility] 4/26 plan for PS/TCT 4/27 Pt failed trach collar twice today. Became visibly distressed, diaphoretic, o2 drops, and accessory muscle use.  4/28 increased WOB, resp distress, using accessory muscles to breath while on vent 4/29 patient placed on PS mode, given BD therapy, alert and awake, was able to communicate with PM valve 07/02/19 -trache changed to cuffed size 6 without incident, patient reports more comfort with improved vocalization.  07/03/19- no overnight events, patient sitting up in bed in no distress.  5/3severe hypoxia and increased WOB, placed back on full vent support 5/4 severe resp distress, can NOT tolerate PS mode, severe resp failure End stage COPD 5/5 severe resp distress, can not tolerate TCT, severe end stage COPD 5/7sitting up in chair, able to do some pressure support trials but no trach collar trials. Still appears very tachypneic 5/8 cannot tolerate trach collar, few hours of PS only 5/9remains vent dependent. Minimal progress with weaning 5/10 increased WOB near cardiac arrest, distended abd 5/12on PS mode 5/14 back on PC mode, very high levels of anxiety 5/16 patient is agreeable to home with hospice after further discourse regarding his recurrent respiratory distress while hospitalized which prevents [place] from discharge.   CC  Severe COPD Severe resp failure     BP (!) 121/95   Pulse 88  Temp 98.2 F (36.8 C) (Axillary)   Resp (!) 27   Ht 5' 5.98" (1.676 m)   Wt 55.5 kg   SpO2 97%   BMI 19.76 kg/m    I/O last 3 completed shifts: In: 2781.6 [I.V.:821; NG/GT:1950; IV Piggyback:10.6] Out: 8786 [Urine:4125] Total I/O In: -  Out: 1150  [Urine:1150]  SpO2: 97 % O2 Flow Rate (L/min): 40 L/min FiO2 (%): 30 %  Estimated body mass index is 19.76 kg/m as calculated from the following:   Height as of this encounter: 5' 5.98" (1.676 m).   Weight as of this encounter: 55.5 kg.  REVIEW OF SYSTEMS  10 point ROS done and is neg except as per subjective findings  PHYSICAL EXAMINATION: Physical Exam  HENT:  Head: Normocephalic and atraumatic.    Cardiovascular: Regular rhythm.  Pulmonary/Chest: Breath sounds normal. No accessory muscle usage. Tachypnea noted. No respiratory distress.  Abdominal: Bowel sounds are normal. He exhibits no distension, no fluid wave and no ascites. There is no abdominal tenderness.  Neurological: He is alert.       MEDICATIONS: I have reviewed all medications and confirmed regimen as documented   CULTURE RESULTS   No results found for this or any previous visit (from the past 240 hour(s)).      BMP Latest Ref Rng & Units 07/19/2019 07/18/2019 07/17/2019  Glucose 70 - 99 mg/dL 132(H) 118(H) 88  BUN 6 - 20 mg/dL 20 17 15   Creatinine 0.61 - 1.24 mg/dL 0.37(L) 0.45(L) 0.40(L)  Sodium 135 - 145 mmol/L 136 136 136  Potassium 3.5 - 5.1 mmol/L 4.8 4.4 4.1  Chloride 98 - 111 mmol/L 93(L) 96(L) 97(L)  CO2 22 - 32 mmol/L 34(H) 35(H) 32  Calcium 8.9 - 10.3 mg/dL 8.9 8.5(L) 8.7(L)         Indwelling Urinary Catheter continued, requirement due to   Reason to continue Indwelling Urinary Catheter strict Intake/Output monitoring for hemodynamic instability         Ventilator continued, requirement due to severe respiratory failure   Ventilator Sedation RASS 0 to -2      ASSESSMENT AND PLAN   Acute on chronic hypercarbic/hypoxic respiratory failure COPD exacerbation s/ptracheostomy -mucopurulent exudation has resolved post antibiotics Bronchodilators: DuoNeb every 4 and as needed   Severe COPD stage IV  Noted noncompliance with oxygen/BiPAP at home -continue  LAMA/LABA/ICS   Tracheitis associated with tracheostomy--resolved -S/p culture of mucopurulent exudate-abundant GPC and GPR consistent with overgrowth of flora -s/p Unasyn with significant improvement in discharge and malodorous smell - completed full course unasyn and s/p erythromycin    Acute encephalopathy-resolved Unresponsiveness Due to hypercarbia    Hypoglycemia-resolved Due to poor nutritional status Continue tube feeds Add D10 ICU hypoglycemia protocol Poor prognostic sign  Acute kidney injury  Avoid nephrotoxins Stable with no worsening Trend renal panel  Hyperkalemia Likely triggered by acidosis RESOLVED  ID Procalcitonin normal No infiltrate on chest x-ray No indication for antibiotics at present Continue to trend WBC/fever curve Had 1 single blood culture of MRSA likely contaminant [name] MRSA PCR negative  Abdominal distention Ileus CT abdomen and pelvis: No obstruction: No free air Findings likely due to ileus, constipation Bowel regimen  Prophylaxis: Lovenox Famotidine    Critical care provider statement:    Critical care time (minutes):  35   Critical care time was exclusive of:  Separately billable procedures and  treating other patients   Critical care was necessary to treat or prevent imminent or  life-threatening deterioration of the following  conditions:  Acute hypercapnic respiratory failure requiring tracheostomy   Critical care was time spent personally by me on the following  activities:  Development of treatment plan with patient or surrogate,  discussions with consultants, evaluation of patient's response to  treatment, examination of patient, obtaining history from patient or  surrogate, ordering and performing treatments and interventions, ordering  and review of laboratory studies and re-evaluation of patient's condition   I assumed direction of critical care for this patient from another  provider in my specialty:  no       Vida Rigger, M.D.  Pulmonary & Critical Care Medicine  [facility]

## 2019-07-19 NOTE — Progress Notes (Signed)
CH visited pt. as follow up from previous visits; pt. sitting up in bed watching TV.  Pt. said he has not been in chair today --> has been on higher vent support all day.  Pt. shared that his mother also had severe COPD.  Pt. continues to want to go home, but also remains grateful for efforts of care team.  Pt. requested prayer; CH offered prayer for pt.'s strength and sense of divine presence; also prayed for hospital team caring for pt.  Ch remains available as needed.    07/19/19 1700  Clinical Encounter Type  Visited With Patient  Visit Type Follow-up;Psychological support;Spiritual support;Critical Care  Referral From Other (Comment) (Routine Rounding)  Spiritual Encounters  Spiritual Needs Emotional;Prayer  Stress Factors  Patient Stress Factors Loss of control;Major life changes;Health changes

## 2019-07-19 NOTE — Progress Notes (Signed)
Occupational Therapy Treatment Patient Details Name: Chris Case MRN: [id number] DOB: 1970-11-14 Today's Date: 07/19/2019    History of present illness Chris Case is a 71yoM who comes [facility] on 4/13 after being found unresponsive. Pt here 29 May 2019 through 03 June 2019. Pt required ventilation upon arrival, failed extubation on 4/16, transitioned to tach on 4/20. Pt has required NGT decompression d/t ABD distension, underwent  PEG placement and lysis adhesions 4/22. 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 Pt has since been trialed on PMV but has not been off vent support long enough to trial much, limited tolerance on tach collar.   OT comments  Pt seen for OT tx this date to f/u re: safety with self care ADLs/ADL mobility and strengthening as it pertains to self care ADLs. OT facilitates pt participation in sup to sit transition with supv for line mgt, sit to stand x3 trials with Supv with cues for safety and awareness of lines. Pt demos G static standing balance with alternating UE support on RW during fxl task (MIN A to complete LB bathing in standing-peri area). OT facilitates pt participation in seated therex (detailed below in exercise session). Pt on vent t/o session, O2 >96% throughout. Pts RR did elevate during standing task, over vent setting of 16 breaths per minute, to 30, but quickly resolves to vent setting level within ~15 seconds of standing rest. Overall, anticipate that LTACH vs SNF with ventilator capacity remains most appropriate d/c recommendation.    Follow Up Recommendations  LTACH;SNF    Equipment Recommendations  3 in 1 bedside commode    Recommendations for Other Services      Precautions / Restrictions Precautions Precautions: Fall Precaution Comments: monitor O2, HR, and BP; trach collar vs mechanical ventilation; HOB >30 degrees; NPO; PEG tube; R PICC line Restrictions Weight Bearing Restrictions: No        Mobility Bed Mobility Overal bed mobility: Needs Assistance Bed Mobility: Sit to Supine     Supine to sit: Supervision;HOB elevated Sit to supine: Supervision      Transfers Overall transfer level: Needs assistance Equipment used: Rolling walker (2 wheeled) Transfers: Sit to/from Stand Sit to Stand: Supervision;+2 safety/equipment         General transfer comment: 2p to manage lines/leads    Balance Overall balance assessment: Needs assistance Sitting-balance support: No upper extremity supported;Feet supported Sitting balance-Leahy Scale: Good     Standing balance support: Bilateral upper extremity supported Standing balance-Leahy Scale: Good                             ADL either performed or assessed with clinical judgement   ADL Overall ADL's : Needs assistance/impaired     Grooming: Wash/dry face;Set up;Sitting       Lower Body Bathing: Minimal assistance Lower Body Bathing Details (indicate cue type and reason): Pt tolerates brief static stand with RW with CGA for standing support, alternates hands from RW to complete LB bathing-peri area. Demos improved dynamic standing balance this date, requiring less assist.                             Vision Baseline Vision/History: No visual deficits Patient Visual Report: No change from baseline     Perception     Praxis      Cognition Arousal/Alertness: Awake/alert Behavior During Therapy:  WFL for tasks assessed/performed Overall Cognitive Status: Within Functional Limits for tasks assessed                                 General Comments: one instance of some impulsivity this date-attempting to stand without communicating to therapy staff. Otherwise appropriate and following commands.        Exercises Other Exercises Other Exercises: Pt requires re-education re: safet awareness relative to lines/leads as he does demo one instance of impulsivity during  session. Pt mouths "I'm not gonna fall" and OT informs pt that the concern is less for the fall and more for safe  mgt of tubing. Pt with good understanding. Other Exercises: OT facilitates pt participation in 3 STS trials at ~2 mins standing each, with 1-2 minute rest breaks between. Other Exercises: OT facilitates pt particiaption in postural extension exercise to improve surface area for better quality of breath for one set x10 reps as well as crossover punches/contralateral reaching for 1 set x10 reps alternating.   Shoulder Instructions       General Comments      Pertinent Vitals/ Pain       Pain Assessment: No/denies pain  Home Living                                          Prior Functioning/Environment              Frequency  Min 1X/week        Progress Toward Goals  OT Goals(current goals can now be found in the care plan section)  Progress towards OT goals: Progressing toward goals  Acute Rehab OT Goals Patient Stated Goal: to go home OT Goal Formulation: With patient Time For Goal Achievement: 07/22/19 Potential to Achieve Goals: Good  Plan Discharge plan remains appropriate;Frequency needs to be updated    Co-evaluation                 AM-PAC OT "6 Clicks" Daily Activity     Outcome Measure   Help from another person eating meals?: None Help from another person taking care of personal grooming?: None Help from another person toileting, which includes using toliet, bedpan, or urinal?: A Lot Help from another person bathing (including washing, rinsing, drying)?: A Lot Help from another person to put on and taking off regular upper body clothing?: A Little Help from another person to put on and taking off regular lower body clothing?: A Little 6 Click Score: 18    End of Session Equipment Utilized During Treatment: Gait belt;Rolling walker  OT Visit Diagnosis: Other abnormalities of gait and mobility (R26.89);Muscle weakness  (generalized) (M62.81)   Activity Tolerance Patient tolerated treatment well   Patient Left in bed;with call bell/phone within reach;with bed alarm set   Nurse Communication Mobility status        Time: 5102-5852 OT Time Calculation (min): 39 min  Charges: OT General Charges $OT Visit: 1 Visit OT Treatments $Self Care/Home Management : 8-22 mins $Therapeutic Activity: 8-22 mins $Therapeutic Exercise: 8-22 mins  Rejeana Brock, MS, OTR/L ascom [phone] 07/19/19, 4:41 PM

## 2019-07-20 LAB — CBC WITH DIFFERENTIAL/PLATELET
Abs Immature Granulocytes: 0.09 10*3/uL — ABNORMAL HIGH (ref 0.00–0.07)
Basophils Absolute: 0 10*3/uL (ref 0.0–0.1)
Basophils Relative: 0 %
Eosinophils Absolute: 0.1 10*3/uL (ref 0.0–0.5)
Eosinophils Relative: 1 %
HCT: 31.2 % — ABNORMAL LOW (ref 39.0–52.0)
Hemoglobin: 9.7 g/dL — ABNORMAL LOW (ref 13.0–17.0)
Immature Granulocytes: 1 %
Lymphocytes Relative: 14 %
Lymphs Abs: 1.5 10*3/uL (ref 0.7–4.0)
MCH: 30.7 pg (ref 26.0–34.0)
MCHC: 31.1 g/dL (ref 30.0–36.0)
MCV: 98.7 fL (ref 80.0–100.0)
Monocytes Absolute: 1.2 10*3/uL — ABNORMAL HIGH (ref 0.1–1.0)
Monocytes Relative: 12 %
Neutro Abs: 7.5 10*3/uL (ref 1.7–7.7)
Neutrophils Relative %: 72 %
Platelets: 120 10*3/uL — ABNORMAL LOW (ref 150–400)
RBC: 3.16 MIL/uL — ABNORMAL LOW (ref 4.22–5.81)
RDW: 13.8 % (ref 11.5–15.5)
WBC: 10.3 10*3/uL (ref 4.0–10.5)
nRBC: 0 % (ref 0.0–0.2)

## 2019-07-20 LAB — MAGNESIUM: Magnesium: 1.7 mg/dL (ref 1.7–2.4)

## 2019-07-20 LAB — GLUCOSE, CAPILLARY
Glucose-Capillary: 100 mg/dL — ABNORMAL HIGH (ref 70–99)
Glucose-Capillary: 104 mg/dL — ABNORMAL HIGH (ref 70–99)
Glucose-Capillary: 115 mg/dL — ABNORMAL HIGH (ref 70–99)
Glucose-Capillary: 118 mg/dL — ABNORMAL HIGH (ref 70–99)
Glucose-Capillary: 146 mg/dL — ABNORMAL HIGH (ref 70–99)
Glucose-Capillary: 168 mg/dL — ABNORMAL HIGH (ref 70–99)
Glucose-Capillary: 97 mg/dL (ref 70–99)

## 2019-07-20 LAB — BASIC METABOLIC PANEL
Anion gap: 8 (ref 5–15)
BUN: 16 mg/dL (ref 6–20)
CO2: 36 mmol/L — ABNORMAL HIGH (ref 22–32)
Calcium: 9.2 mg/dL (ref 8.9–10.3)
Chloride: 92 mmol/L — ABNORMAL LOW (ref 98–111)
Creatinine, Ser: 0.43 mg/dL — ABNORMAL LOW (ref 0.61–1.24)
GFR calc Af Amer: >60 mL/min (ref >60)
GFR calc non Af Amer: >60 mL/min (ref >60)
Glucose, Bld: 110 mg/dL — ABNORMAL HIGH (ref 70–99)
Potassium: 4.7 mmol/L (ref 3.5–5.1)
Sodium: 136 mmol/L (ref 135–145)

## 2019-07-20 LAB — PHOSPHORUS: Phosphorus: 3.6 mg/dL (ref 2.5–4.6)

## 2019-07-20 MED ORDER — MAGNESIUM SULFATE 2 GM/50ML IV SOLN
2.0000 g | Freq: Once | INTRAVENOUS | Status: AC
  Administered 2019-07-20: 2 g via INTRAVENOUS
  Filled 2019-07-20: qty 50

## 2019-07-20 MED ORDER — GERHARDT'S BUTT CREAM
TOPICAL_CREAM | Freq: Two times a day (BID) | CUTANEOUS | Status: DC
  Administered 2019-07-24 – 2019-07-25 (×3): 1 via TOPICAL
  Filled 2019-07-20 (×2): qty 1

## 2019-07-20 MED ORDER — MAGNESIUM OXIDE 400 (241.3 MG) MG PO TABS
800.0000 mg | ORAL_TABLET | Freq: Two times a day (BID) | ORAL | Status: DC
  Administered 2019-07-20 – 2019-07-29 (×19): 800 mg
  Filled 2019-07-20 (×19): qty 2

## 2019-07-20 NOTE — Progress Notes (Addendum)
PHARMACY CONSULT NOTE  Pharmacy Consult for Electrolyte Monitoring and Replacement   Recent Labs: Potassium (mmol/L)  Date Value  07/20/2019 4.7  09/24/2013 4.6   Magnesium (mg/dL)  Date Value  27/74/1287 1.7   Calcium (mg/dL)  Date Value  86/76/7209 9.2   Calcium, Total (mg/dL)  Date Value  47/11/6281 8.9   Albumin (g/dL)  Date Value  66/29/4765 3.5   Phosphorus (mg/dL)  Date Value  46/50/3546 3.6   Sodium (mmol/L)  Date Value  07/20/2019 136  09/24/2013 138    Assessment: 49 year old male admitted after being found unresponsive and admitted with end stage COPD. Patient was intubated in the ED and transferred to the ICU. Patient with trach and G-tube.  Electrolytes: Goal: K ~4, Mag ~2.  All other electrolytes WNL  Patient started on mag-ox 800 mg VT BID.   Will give magnesium sulfate 2 g IV x 1 dose. Will check electrolytes with AM labs.    Glucose: Following CBGs q4h Patient restarted tube feeds 5/13  D5 stopped today.   Currently on prednisone 40 mg daily x 4 doses Will continue to monitor.  Constipation: LBM 5/16.  Currently orders for scheduled Miralax and Senokot-S.  Per MAR, multiple loose stools documented on 5/16, and patient refused laxatives on 5/17.  He resumed Senokot-S today.  Will monitor tomorrow.  Pharmacy will continue to monitor and adjust per consult.   Cherly Hensen, PharmD 07/20/2019 11:53 AM

## 2019-07-20 NOTE — Progress Notes (Signed)
PT Cancellation Note  Patient Details Name: Lonie Newsham MRN: [id number] DOB: 10-26-1970   Cancelled Treatment:     PT attempt, pt just performed OOB to recliner with RN staff. Noted fatigue and WOB upon entering room. Therapist will return at later time/date when pt is more appropriate to treat. Will continue to follow current POC progressing as able per pt tolerance.    Rushie Chestnut 07/20/2019, 11:34 AM

## 2019-07-20 NOTE — Progress Notes (Addendum)
Daily Progress Note   Patient Name: Chris Case       Date: 07/20/2019 DOB: 04-Oct-1970  Age: 49 y.o. MRN#: [id number] Attending Physician: Flora Lipps, MD Primary Care Physician: [facility], [place] Date: 06/15/2019  Reason for Consultation/Follow-up: Establishing goals of care  Subjective: Patient is sitting in bedside chair. Discussed his wishes to go home. He asked what hospice was. Upon discussing that it is end of life care, he states currently, he would be happy if he could tolerate TC during the day, and vent support at night. He has a lonterm goal of weaning off the ventilator and attempting to resume his previous QOL. He states he does not want to live on the ventilator, but may be willing to do so if it meant he would die with discontinuing it. He states he will have to think about this. Discussed a route of comfort and a route of continued aggressive care. He is aware if he chooses to be liberated permanently from the ventilator, it will need to occur in the hospital where he has access to any required medications for his comfort. He would die in the hospital. If he chooses to continue to attempt to be weaned from the ventilator, he will need facility placement for the daily changes involved in weaning. If he chooses to go home with the ventilator, he will need home health, and 24/7 assistance with training on all of his needs including hygiene, trach, care, vent use, and feeding tube use. He would like to consider his options.   Length of Stay: 35  Current Medications: Scheduled Meds:  . ALPRAZolam  0.25 mg Per Tube TID  . amoxicillin-clavulanate  1 tablet Per Tube Q12H  . budesonide (PULMICORT) nebulizer solution  0.5 mg Nebulization BID  . chlorhexidine  15 mL  Mouth Rinse BID  . Chlorhexidine Gluconate Cloth  6 each Topical Daily  . enoxaparin (LOVENOX) injection  40 mg Subcutaneous Q24H  . escitalopram  10 mg Per Tube Daily  . famotidine  20 mg Per Tube BID  . ipratropium-albuterol  3 mL Nebulization Q4H  . magnesium oxide  800 mg Per Tube BID  . mouth rinse  15 mL Mouth Rinse q12n4p  . melatonin  10 mg Per Tube QHS  . nutrition supplement (JUVEN)  1 packet Per  Tube BID BM  . polyethylene glycol  17 g Per Tube Daily  . predniSONE  40 mg Per Tube Q breakfast  . senna-docusate  2 tablet Per Tube BID  . sodium chloride flush  10-40 mL Intracatheter Q12H    Continuous Infusions: . sodium chloride Stopped (06/25/19 1712)  . feeding supplement (OSMOLITE 1.5 CAL) 1,000 mL (07/20/19 0918)    PRN Meds: sodium chloride, acetaminophen, acetaminophen, guaiFENesin, ipratropium-albuterol, LORazepam, morphine injection, oxyCODONE-acetaminophen, senna-docusate, sodium chloride flush  Physical Exam Pulmonary:     Comments: Trach in place and ventilator.  Neurological:     Mental Status: He is alert.             Vital Signs: BP 95/74 (BP Location: Left Arm)   Pulse 97   Temp 98.6 F (37 C) (Oral)   Resp 18   Ht 5' 5.98" (1.676 m)   Wt 55.5 kg   SpO2 97%   BMI 19.76 kg/m  SpO2: SpO2: 97 % O2 Device: O2 Device: Ventilator O2 Flow Rate: O2 Flow Rate (L/min): 40 L/min  Intake/output summary:   Intake/Output Summary (Last 24 hours) at 07/20/2019 1232 Last data filed at 07/20/2019 1130 Gross per 24 hour  Intake 3416.06 ml  Output 2575 ml  Net 841.06 ml   LBM: Last BM Date: 07/18/19 Baseline Weight: Weight: 55.2 kg Most recent weight: Weight: 55.5 kg       Palliative Assessment/Data:    Flowsheet Rows     Most Recent Value  Intake Tab  Referral Department  Critical care  Unit at Time of Referral  ICU  Date Notified  06/16/19  Palliative Care Type  Not seen  Reason Not Seen  Consult cancelled  Reason for referral  Clarify Goals  of Care  Date of Admission  06/15/19  # of days IP prior to Palliative referral  1  Clinical Assessment  Psychosocial & Spiritual Assessment  Palliative Care Outcomes      Patient Active Problem List   Diagnosis Date Noted  . Pressure injury of skin 06/28/2019  . Unresponsiveness 06/15/2019  . Encounter for hospice care discussion   . Left eyelid laceration   . Acute metabolic encephalopathy   . Pulmonary hypertension (HCC)   . Goals of care, counseling/discussion   . Palliative care by specialist   . DNR (do not resuscitate) discussion   . COPD, very severe (HCC)   . Elevated troponin 05/29/2019  . Syncope 05/29/2019  . Acute on chronic respiratory failure with hypoxemia (HCC) 04/16/2019  . Acute respiratory failure with hypoxia (HCC) 01/24/2019  . Thrombocytopenia (HCC) 01/24/2019  . Protein-calorie malnutrition, severe 12/10/2016  . Intestinal adhesions with complete obstruction (HCC) 12/05/2016  . Constipation   . SBO (small bowel obstruction) (HCC)   . Acute bronchitis 09/26/2015  . COPD exacerbation (HCC) 09/24/2015  . Acute on chronic respiratory failure with hypoxia and hypercapnia (HCC) 09/24/2015  . Abnormal ECG 09/24/2015  . Edema, lower extremity 09/24/2015    Palliative Care Assessment & Plan     Recommendations/Plan:  Patient deciding how he wants to move forward with care.     Code Status:    Code Status Orders  (From admission, onward)         Start     Ordered   06/17/19 1046  Do not attempt resuscitation (DNR)  Continuous    Question Answer Comment  In the event of cardiac or respiratory ARREST Do not call a "code blue"   In the event  of cardiac or respiratory ARREST Do not perform Intubation, CPR, defibrillation or ACLS   In the event of cardiac or respiratory ARREST Use medication by any route, position, wound care, and other measures to relive pain and suffering. May use oxygen, suction and manual treatment of airway obstruction as  needed for comfort.      06/17/19 1046        Code Status History    Date Active Date Inactive Code Status Order ID Comments User Context   06/15/2019 0632 06/17/2019 1046 Full Code [id number]  Eugenie Norrie, NP ED   06/01/2019 1126 06/03/2019 2206 DNR [id number]  Katheran Awe, NP Inpatient   05/29/2019 0910 06/01/2019 1125 Full Code [id number]  Lorretta Harp, MD ED   04/17/2019 0026 04/20/2019 1937 Full Code [id number]  Rometta Emery, MD ED   01/24/2019 1932 01/27/2019 1831 Full Code [id number]  Anselm Jungling, DO ED   12/05/2016 0157 12/15/2016 1800 Full Code [id number]  Ancil Linsey, MD ED   09/24/2015 1353 09/24/2015 1430 Full Code [id number]  Marguarite Arbour, MD Inpatient   Advance Care Planning Activity       Prognosis:   Unable to determine    Care plan was discussed with CCM, RN  Thank you for allowing the Palliative Medicine Team to assist in the care of this patient.   Total Time 35 min Prolonged Time Billed no      Greater than 50%  of this time was spent counseling and coordinating care related to the above assessment and plan.  Morton Stall, NP  Please contact Palliative Medicine Team phone at [phone] for questions and concerns.

## 2019-07-20 NOTE — Progress Notes (Signed)
Nutrition Follow-up  DOCUMENTATION CODES:   Severe malnutrition in context of chronic illness  INTERVENTION:  Continue Osmolite 1.5 Cal at 75 mL/hr (1800 mL goal daily volume). Provides 2700 kcal, 113 grams of protein, 366.5 grams of carbohydrates, 1368 mL H2O daily.  Goal TF regimen meets 100% RDIs for vitamins/minerals.  Continue Juven 1 packetper tubeBID. Each packet provides 95 kcal, 7 grams L-Arginine, 7 grams L-Glutamine, 2.5 grams collagen protein, 300 mg vitamin C, 9.5 mg zinc, and other micronutrients essential for wound healing.  Continue minimum free water flush of 30 mL Q4hrs to maintain tube patency.  NUTRITION DIAGNOSIS:   Severe Malnutrition related to chronic illness(COPD) as evidenced by severe fat depletion, severe muscle depletion.  Ongoing - addressing with TF regimen.  GOAL:   Patient will meet greater than or equal to 90% of their needs  Met with TF regimen.  MONITOR:   Labs, Weight trends, Vent status, TF tolerance, I & O's  REASON FOR ASSESSMENT:   Ventilator    ASSESSMENT:   49 year old male with PMHx of emphysema, COPD, hx TB admitted with acute COPD exacerbation requiring intubation.   -Patient s/p tracheostomy tube placement on 4/20. -Patient s/p robotic assisted laparoscopic G-tube placement with extensive lysis of adhesions on 4/22.  Tube feeds were held 5/9-5/13. Tube feeds were resumed at 15 mL/hr on 5/13 and then on 5/14 began advancing towards goal. Met with patient at bedside. He reports he is tolerating tube feeds well. Denies any abdominal distention or pain. He endorses he is passing flatus and having bowel movements.   Noted in chart patient is discussing goals of care with PMT.  Patient is currently on ventilator support via trach MV: 10.1 L/min Temp (24hrs), Avg:98.3 F (36.8 C), Min:98 F (36.7 C), Max:98.6 F (37 C)  Medications reviewed and include: Augmentin, famotidine, magnesium oxide 800 mg BID, prednisone 40 mg  daily, senna-docusate 2 tablets BID.  Labs reviewed: CBG 97-146, Chloride 92, CO2 36, Creatinine 0.43.  Enteral Access: G-tube  I/O: 2575 mL UOP yesterday (1.9 mL/kg/hr)  Weight trend: last weight was 55.5 kg on 5/15, which was stable from admission weight of 55.2 kg  Discussed on rounds.  Diet Order:   Diet Order            Diet NPO time specified  Diet effective midnight             EDUCATION NEEDS:   No education needs have been identified at this time  Skin:  Skin Assessment: Skin Integrity Issues:(stage 3 right buttocks (3cm x 3cm x 0.1cm))  Last BM:  07/18/2019  Height:   Ht Readings from Last 1 Encounters:  07/17/19 5' 5.98" (1.676 m)   Weight:   Wt Readings from Last 1 Encounters:  07/17/19 55.5 kg   Ideal Body Weight:  64.5 kg  BMI:  Body mass index is 19.76 kg/m.  Estimated Nutritional Needs:   Kcal:  2500-2700  Protein:  100-115 grams  Fluid:  >/= 1.6 L/day  Jacklynn Barnacle, MS, RD, LDN Pager number available on Amion

## 2019-07-20 NOTE — Progress Notes (Signed)
CRITICAL CARE NOTE  49 year old former smoker with a history very severe COPD stage IV, who presented via EMS after being found down and unresponsive.  Apparently the patient's mother placed a call to EMS.  The patient has a longstanding history of COPD and noncompliance with his oxygen and CPAP.  History could not be obtained from the patient as there was no family available and the patient had been intubated and was mechanically ventilated in the ED.  Of note he had recently been admitted to [facility] 29 May 2019 through 03 June 2019.  He had been evaluated by palliative care during this admission and actually had a DNR order.   Patient also had a 2D echo  with pulmonary hypertension consistent with cor pulmonale.  Records review: Patient has been following with Dr. Ned Clines at [facility].  Last known PFTs 11 September 2017.  FEV1 0.52 L or 20% predicted, FVC 1.49 L or 45% of predicted, FEV1/FVC 35%.  FEF 25-75 6%.  DLCO was 33%.  This is consistent with very severe COPD.  Events: 4/13 admitted for severe resp failure from COPD 4/14 remains critically ill on vent 4/15 remains critically ill, severe COPD, failed SAT/SBT 4/16 extubated yesterday, had to be reintubated remains on ventilator 4/17 sedated on the ventilator 4/18 abdominal distention, CT abdomen and pelvis consistent with ileus and constipation 4/19-patietn remains on ventialtor awainting trach 4/20 - patient is on vent with trach, he had episodes of aggitation today but eventually improved  4/21 - patient with significant distension of abdomen, concern for aerophagia in NIV.  There is mucopurulent exudation from trache this was cultures and patient placed on Unasyn for tracheitis 06/24/19 -trache site improved with less malodorous pus draining, NGT with decompression worked well abdomen is less distended. S/p PEG 06/25/19- patient had some anxiety throughout day today, we have started low dose anxiolytic. His PEG is now being  utilized. He has some placement issues with social work due to insurance and we are working on this now.  06/26/19-Patient had no events today, we are waiting for placement now from transitional care team 06/27/19- Patient is calm this morining, abdomen is mildy distended, awaiting placement to [facility] 4/26 plan for PS/TCT 4/27 Pt failed trach collar twice today. Became visibly distressed, diaphoretic, o2 drops, and accessory muscle use.  4/28 increased WOB, resp distress, using accessory muscles to breath while on vent 4/29 patient placed on PS mode, given BD therapy, alert and awake, was able to communicate with PM valve 07/02/19 -trache changed to cuffed size 6 without incident, patient reports more comfort with improved vocalization.  07/03/19- no overnight events, patient sitting up in bed in no distress.  5/3severe hypoxia and increased WOB, placed back on full vent support 5/4 severe resp distress, can NOT tolerate PS mode, severe resp failure End stage COPD 5/5 severe resp distress, can not tolerate TCT, severe end stage COPD 5/7sitting up in chair, able to do some pressure support trials but no trach collar trials. Still appears very tachypneic 5/8 cannot tolerate trach collar, few hours of PS only 5/9remains vent dependent. Minimal progress with weaning 5/10 increased WOB near cardiac arrest, distended abd 5/12on PS mode 5/14 back on PC mode, very high levels of anxiety 5/16 patient is agreeable to home with hospice after further discourse regarding his recurrent respiratory distress while hospitalized which prevents [place] from discharge.  5/18- discussed DC plan with CM and SW today. Palliative care on case  CC  Severe COPD Severe resp  failure     BP 102/80   Pulse 98   Temp 98.9 F (37.2 C) (Oral)   Resp (!) 7   Ht 5' 5.98" (1.676 m)   Wt 55.5 kg   SpO2 98%   BMI 19.76 kg/m    I/O last 3 completed shifts: In: 3606.3 [I.V.:666.3; Other:150; NG/GT:2790] Out: 4575  [Urine:4575] Total I/O In: 58.8 [I.V.:46.7; IV Piggyback:12] Out: 650 [Urine:650]  SpO2: 98 % O2 Flow Rate (L/min): 40 L/min FiO2 (%): 30 %  Estimated body mass index is 19.76 kg/m as calculated from the following:   Height as of this encounter: 5' 5.98" (1.676 m).   Weight as of this encounter: 55.5 kg.  REVIEW OF SYSTEMS  10 point ROS done and is neg except as per subjective findings  PHYSICAL EXAMINATION: Physical Exam  HENT:  Head: Normocephalic and atraumatic.    Cardiovascular: Regular rhythm.  Pulmonary/Chest: Breath sounds normal. No accessory muscle usage. Tachypnea noted. No respiratory distress.  Abdominal: Bowel sounds are normal. He exhibits no distension, no fluid wave and no ascites. There is no abdominal tenderness.  Neurological: He is alert.       MEDICATIONS: I have reviewed all medications and confirmed regimen as documented   CULTURE RESULTS   No results found for this or any previous visit (from the past 240 hour(s)).      BMP Latest Ref Rng & Units 07/20/2019 07/19/2019 07/18/2019  Glucose 70 - 99 mg/dL 408(X) 448(J) 856(D)  BUN 6 - 20 mg/dL 16 20 17   Creatinine 0.61 - 1.24 mg/dL ) 1.49(F) 0.26(V)  Sodium 135 - 145 mmol/L 136 136 136  Potassium 3.5 - 5.1 mmol/L 4.7 4.8 4.4  Chloride 98 - 111 mmol/L 92(L) 93(L) 96(L)  CO2 22 - 32 mmol/L 36(H) 34(H) 35(H)  Calcium 8.9 - 10.3 mg/dL 9.2 8.9 7.85(Y)         Indwelling Urinary Catheter continued, requirement due to   Reason to continue Indwelling Urinary Catheter strict Intake/Output monitoring for hemodynamic instability         Ventilator continued, requirement due to severe respiratory failure   Ventilator Sedation RASS 0 to -2      ASSESSMENT AND PLAN   Acute on chronic hypercarbic/hypoxic respiratory failure COPD exacerbation s/ptracheostomy -mucopurulent exudation has resolved post antibiotics Bronchodilators: DuoNeb every 4 and as needed   Severe COPD stage IV    Now on ventilator -continue LAMA/LABA/ICS   Tracheitis associated with tracheostomy--resolved -S/p culture of mucopurulent exudate-abundant GPC and GPR consistent with overgrowth of flora -s/p Unasyn with significant improvement in discharge and malodorous smell - completed full course unasyn and s/p erythromycin    Acute encephalopathy-resolved Unresponsiveness Due to hypercarbia    Hypoglycemia-resolved Due to poor nutritional status Continue tube feeds Add D10 ICU hypoglycemia protocol Poor prognostic sign  Acute kidney injury  Avoid nephrotoxins Stable with no worsening Trend renal panel  Hyperkalemia Likely triggered by acidosis RESOLVED  ID Procalcitonin normal No infiltrate on chest x-ray No indication for antibiotics at present Continue to trend WBC/fever curve Had 1 single blood culture of MRSA likely contaminant [name] MRSA PCR negative  Abdominal distention Ileus CT abdomen and pelvis: No obstruction: No free air Findings likely due to ileus, constipation Bowel regimen  Prophylaxis: Lovenox Famotidine    Critical care provider statement:    Critical care time (minutes):  34   Critical care time was exclusive of:  Separately billable procedures and  treating other patients   Critical care was necessary  to treat or prevent imminent or  life-threatening deterioration of the following conditions:  Acute hypercapnic respiratory failure requiring tracheostomy   Critical care was time spent personally by me on the following  activities:  Development of treatment plan with patient or surrogate,  discussions with consultants, evaluation of patient's response to  treatment, examination of patient, obtaining history from patient or  surrogate, ordering and performing treatments and interventions, ordering  and review of laboratory studies and re-evaluation of patient's condition   I assumed direction of critical care for this patient from  another  provider in my specialty: no       Ottie Glazier, M.D.  Pulmonary & [place]

## 2019-07-20 NOTE — Progress Notes (Signed)
Patient remains on the ventilator via trach and is currently resting in bed with no apparent distress. He is alert and oriented x4. He is on no sedation, but is receiving Osmolite 1.5 cal at 75 mL/hr through his peg tube. Patient was switched to pressure support during the day, but was switched back to a set respiratory rate on the ventilator overnight so that he is able to rest. At 1940, he complained of 7/10 buttocks pain, and he was treated with 1 percocet tablet, which provided relief. The patient was bathed and had a linen change at 2200. All vitals are WDL. Will continue to monitor.   Carmel Sacramento, RN

## 2019-07-20 NOTE — Progress Notes (Signed)
Pt has had a good day. He is sitting up in chair with no distress. Visitor at bedside. Pt has been completely alert and oriented today and follows commands. Pt was switched to pressure support on vent this afternoon and has been tolerating it well. Palliative Care NP spoke with patient about hospice care today and his options. Pt states that he is not ready to die and does not want hospice at this time. Pt has received PRN pains meds throughout the day and has been very pleasant.

## 2019-07-20 NOTE — Consult Note (Signed)
WOC Nurse wound follow up Wound type:stage 3 pressure injury to right buttock.  Measurement: 3 cm x 3 cm x 0.1 cm  Wound EQF:DVOU and moist Drainage (amount, consistency, odor) scant weeping.  Periwound:wound edges are slightly larger but wound bed is clean now.  Dressing procedure/placement/frequency: Cleanse right buttock wound with NS and pat dry.  Apply gerhardts butt paste twice daily.  Leave open to air.   Will not follow at this time.  Please re-consult if needed.  Maple Hudson MSN, RN, FNP-BC CWON Wound, Ostomy, Continence Nurse Pager [phone]

## 2019-07-20 NOTE — Progress Notes (Signed)
CH visited pt. as follow-up from prior visits.  Pt. in chair @ bedside; pt. endorsed feeling OK today, but as conversation unfolded, CH noted a marked sense of weariness and discouragement in pt.'s underlying demeanor that differed from how pt. has seemed in prior visits.  Pt. shared he continues to have difficulty tolerating trach collar, but that he wants to continue trials, with the aid of nasal canula for breathing support when pt. feels like he needs more air.  Pt. remains determined to somehow come off ventilator and expressed this desire several times during visit.  CH discussed pt.'s perception of barriers to this happening; pt. said he gets very anxious when using trach collar and panics; he also said he believes he is unable to sufficiently exhale Co2 when on trach collar and that this is part of why trials have not been succesful.  Pt. is aware that [facility] take him w/trach and ventilator, but per CH's conversations w/CSW last week, these do not appear to be options due to cost/insurance (pt. did not mention this).   Pt. was very affected by visit w/palliative care provider earlier today; pt. expressed appreciation for the directness of PC provider's approach, but pt. not willing at this time to accept dire prognosis offered.  Pt. has begun completing advanced directive left by [facility] last week, however; Pt. had not filled out Living Will and was not ready to do so today.  CH hopes to help pt. complete AD this week if he is willing. CH attempted to explore pt.'s feelings about dying and death; pt. shared he is not afraid to die, but he does not want to know in advance when he will die.  CH asked, 'What has been keeping you going all these days in the hospital fighting to breathe?'  Pt. responded, 'The Man Upstairs first of all, then you, [name], and my dog.'  CH offered prayer for pt.'s sense of divine presence and peace, for wisdom and cooperation b/w pt. and medical team as they make  decisions re: pt.'s plan of care, and for pt. to be brought to wholeness in body and spirit, either in this life or life to come.  CH remains available to pt. and staff as needed and plans to follow up tomorrow.    07/20/19 1430  Clinical Encounter Type  Visited With Patient  Visit Type Follow-up;Psychological support;Social support;Spiritual support;Critical Care  Referral From Patient;Other (Comment) (Routine Rounding)  Spiritual Encounters  Spiritual Needs Emotional;Prayer  Stress Factors  Patient Stress Factors Loss of control;Health changes;Major life changes

## 2019-07-21 LAB — CBC WITH DIFFERENTIAL/PLATELET
Abs Immature Granulocytes: 0.07 10*3/uL (ref 0.00–0.07)
Basophils Absolute: 0 10*3/uL (ref 0.0–0.1)
Basophils Relative: 0 %
Eosinophils Absolute: 0.1 10*3/uL (ref 0.0–0.5)
Eosinophils Relative: 1 %
HCT: 29.4 % — ABNORMAL LOW (ref 39.0–52.0)
Hemoglobin: 9.1 g/dL — ABNORMAL LOW (ref 13.0–17.0)
Immature Granulocytes: 1 %
Lymphocytes Relative: 12 %
Lymphs Abs: 1.2 10*3/uL (ref 0.7–4.0)
MCH: 30.3 pg (ref 26.0–34.0)
MCHC: 31 g/dL (ref 30.0–36.0)
MCV: 98 fL (ref 80.0–100.0)
Monocytes Absolute: 0.6 10*3/uL (ref 0.1–1.0)
Monocytes Relative: 7 %
Neutro Abs: 7.3 10*3/uL (ref 1.7–7.7)
Neutrophils Relative %: 79 %
Platelets: 122 10*3/uL — ABNORMAL LOW (ref 150–400)
RBC: 3 MIL/uL — ABNORMAL LOW (ref 4.22–5.81)
RDW: 13.7 % (ref 11.5–15.5)
WBC: 9.2 10*3/uL (ref 4.0–10.5)
nRBC: 0 % (ref 0.0–0.2)

## 2019-07-21 LAB — BASIC METABOLIC PANEL
Anion gap: 8 (ref 5–15)
BUN: 18 mg/dL (ref 6–20)
CO2: 36 mmol/L — ABNORMAL HIGH (ref 22–32)
Calcium: 8.9 mg/dL (ref 8.9–10.3)
Chloride: 90 mmol/L — ABNORMAL LOW (ref 98–111)
Creatinine, Ser: 0.42 mg/dL — ABNORMAL LOW (ref 0.61–1.24)
GFR calc Af Amer: >60 mL/min (ref >60)
GFR calc non Af Amer: >60 mL/min (ref >60)
Glucose, Bld: 151 mg/dL — ABNORMAL HIGH (ref 70–99)
Potassium: 3.8 mmol/L (ref 3.5–5.1)
Sodium: 134 mmol/L — ABNORMAL LOW (ref 135–145)

## 2019-07-21 LAB — PHOSPHORUS: Phosphorus: 3 mg/dL (ref 2.5–4.6)

## 2019-07-21 LAB — GLUCOSE, CAPILLARY
Glucose-Capillary: 117 mg/dL — ABNORMAL HIGH (ref 70–99)
Glucose-Capillary: 127 mg/dL — ABNORMAL HIGH (ref 70–99)
Glucose-Capillary: 131 mg/dL — ABNORMAL HIGH (ref 70–99)
Glucose-Capillary: 177 mg/dL — ABNORMAL HIGH (ref 70–99)
Glucose-Capillary: 63 mg/dL — ABNORMAL LOW (ref 70–99)
Glucose-Capillary: 93 mg/dL (ref 70–99)

## 2019-07-21 LAB — MAGNESIUM: Magnesium: 1.6 mg/dL — ABNORMAL LOW (ref 1.7–2.4)

## 2019-07-21 MED ORDER — MAGNESIUM SULFATE 4 GM/100ML IV SOLN
4.0000 g | Freq: Once | INTRAVENOUS | Status: AC
  Administered 2019-07-21: 4 g via INTRAVENOUS
  Filled 2019-07-21: qty 100

## 2019-07-21 MED ORDER — DEXTROSE 50 % IV SOLN
25.0000 mL | Freq: Once | INTRAVENOUS | Status: AC
  Administered 2019-07-21: 25 mL via INTRAVENOUS

## 2019-07-21 NOTE — Progress Notes (Signed)
Pt resting in bed at this time with no distress. Pt has had a couple of anxiety episodes today with very thick tan secretions. Pt tolerated Pressure Support for a few hours on the vent and was recently put back on rate. Pt has been alert and oriented throughout the day. VSS.

## 2019-07-21 NOTE — Progress Notes (Signed)
Pt called me into room at this time and said he could not breathe. He was extremely anxious and visibly distressed and struggling to breathe. O2 sats were 76%. RT was called to bedside and morphine and ativan push were given. RT suctioned a moderate amount of thick tan secretions. Pt is not more relaxed.

## 2019-07-21 NOTE — Progress Notes (Signed)
Pt became anxious again this afternoon and required aggressive suctioning and lavaging by RT. Morphine and ativan also administered.

## 2019-07-21 NOTE — TOC Progression Note (Addendum)
Transition of Care [facility]) - Progression Note    Patient Details  Name: Chris Case MRN: [id number] Date of Birth: 1970-08-17  [facility]) CM/SW Contact  Liliana Cline, LCSW Phone Number: 07/21/2019, 1:44 PM  Clinical Narrative:   CSW completed calls to [facility] SNFs to follow-up.  [facility]- Call to [facility]. Left a voicemail requesting a return call on if they have any beds so a referral can be sent. -- 3:15- Spoke with Prem who reported they do not have any vent beds available. Faxed referral and Prem reported he will call CSW if any beds become available.   [phone]- Call to Admissions, spoke with Damian Leavell, to follow-up on referral sent last week. She reported she will have the other Admissions Worker Clydie Braun call CSW back about status of referral and if they can offer a bed.   [facility]- [phone]- [facility] Misty sent referral to Admissions Worker Coralee North on 5/14 via fax. Call to Admissions Worker [facility] to follow-up on referral. Left a voicemail requesting a return call. -- 3:00- Voicemail from [facility] stating they are not able to accommodate patient.    Expected Discharge Plan: Home w Home Health Services Barriers to Discharge: Continued Medical Work up  Expected Discharge Plan and Services Expected Discharge Plan: Home w Home Health Services     Post Acute Care Choice: Home Health Living arrangements for the past 2 months: Single Family Home                             HH Agency: [facility]     Representative spoke with at [facility] Agency: Feliberto Gottron   Social Determinants of Health (SDOH) Interventions    Readmission Risk Interventions Readmission Risk Prevention Plan 06/29/2019  Transportation Screening Complete  PCP or Specialist Appt within 3-5 Days Complete  HRI or Home Care Consult Complete  Palliative Care Screening Complete  Medication Review (RN Care  Manager) Complete  Some recent data might be hidden

## 2019-07-21 NOTE — TOC Progression Note (Signed)
Transition of Care [facility]) - Progression Note    Patient Details  Name: Cortavius Montesinos MRN: [id number] Date of Birth: 1970/08/13  Transition [facility]) CM/SW Contact  Liliana Cline, LCSW Phone Number: 07/21/2019, 9:24 AM  Clinical Narrative:   Call from [facility] who reports they cannot accommodate patient due to staffing issues. Orpah Clinton said he will call CSW if this changes, but at this time they would not be able to provide nursing services at home to patient (if patient does discharge home) for the forseeable future.    Expected Discharge Plan: Home w Home Health Services Barriers to Discharge: Continued Medical Work up  Expected Discharge Plan and Services Expected Discharge Plan: Home w Home Health Services     Post Acute Care Choice: Home Health Living arrangements for the past 2 months: Single Family Home                             HH Agency: [facility]     Representative spoke with at [facility] Agency: Feliberto Gottron   Social Determinants of Health (SDOH) Interventions    Readmission Risk Interventions Readmission Risk Prevention Plan 06/29/2019  Transportation Screening Complete  PCP or Specialist Appt within 3-5 Days Complete  HRI or Home Care Consult Complete  Palliative Care Screening Complete  Medication Review (RN Care Manager) Complete  Some recent data might be hidden

## 2019-07-21 NOTE — Progress Notes (Signed)
CRITICAL CARE NOTE  49 year old former smoker with a history very severe COPD stage IV, who presented via EMS after being found down and unresponsive.  Apparently the patient's mother placed a call to EMS.  The patient has a longstanding history of COPD and noncompliance with his oxygen and CPAP.  History could not be obtained from the patient as there was no family available and the patient had been intubated and was mechanically ventilated in the ED.  Of note he had recently been admitted to [facility] 29 May 2019 through 03 June 2019.  He had been evaluated by palliative care during this admission and actually had a DNR order.   Patient also had a 2D echo  with pulmonary hypertension consistent with cor pulmonale.  Records review: Patient has been following with Dr. Ned Clines at [facility].  Last known PFTs 11 September 2017.  FEV1 0.52 L or 20% predicted, FVC 1.49 L or 45% of predicted, FEV1/FVC 35%.  FEF 25-75 6%.  DLCO was 33%.  This is consistent with very severe COPD.  Events: 4/13 admitted for severe resp failure from COPD 4/14 remains critically ill on vent 4/15 remains critically ill, severe COPD, failed SAT/SBT 4/16 extubated yesterday, had to be reintubated remains on ventilator 4/17 sedated on the ventilator 4/18 abdominal distention, CT abdomen and pelvis consistent with ileus and constipation 4/19-patietn remains on ventialtor awainting trach 4/20 - patient is on vent with trach, he had episodes of aggitation today but eventually improved  4/21 - patient with significant distension of abdomen, concern for aerophagia in NIV.  There is mucopurulent exudation from trache this was cultures and patient placed on Unasyn for tracheitis 06/24/19 -trache site improved with less malodorous pus draining, NGT with decompression worked well abdomen is less distended. S/p PEG 06/25/19- patient had some anxiety throughout day today, we have started low dose anxiolytic. His PEG is now being  utilized. He has some placement issues with social work due to insurance and we are working on this now.  06/26/19-Patient had no events today, we are waiting for placement now from transitional care team 06/27/19- Patient is calm this morining, abdomen is mildy distended, awaiting placement to [facility] 4/26 plan for PS/TCT 4/27 Pt failed trach collar twice today. Became visibly distressed, diaphoretic, o2 drops, and accessory muscle use.  4/28 increased WOB, resp distress, using accessory muscles to breath while on vent 4/29 patient placed on PS mode, given BD therapy, alert and awake, was able to communicate with PM valve 07/02/19 -trache changed to cuffed size 6 without incident, patient reports more comfort with improved vocalization.  07/03/19- no overnight events, patient sitting up in bed in no distress.  5/3severe hypoxia and increased WOB, placed back on full vent support 5/4 severe resp distress, can NOT tolerate PS mode, severe resp failure End stage COPD 5/5 severe resp distress, can not tolerate TCT, severe end stage COPD 5/7sitting up in chair, able to do some pressure support trials but no trach collar trials. Still appears very tachypneic 5/8 cannot tolerate trach collar, few hours of PS only 5/9remains vent dependent. Minimal progress with weaning 5/10 increased WOB near cardiac arrest, distended abd 5/12on PS mode 5/14 back on PC mode, very high levels of anxiety 5/16 patient is agreeable to home with hospice after further discourse regarding his recurrent respiratory distress while hospitalized which prevents [place] from discharge.  5/18- discussed DC plan with CM and SW today. Palliative care on case 07/21/19 - patient had episode of respiratory  distress again when off ventilator after few hours. This will be difficult for placement and he will likely be brought back to hospital very quickly.  CC  Severe COPD Severe resp failure     BP (!) 89/66   Pulse 86   Temp 98.7 F  (37.1 C) (Oral)   Resp 15   Ht 5' 5.98" (1.676 m)   Wt 54 kg   SpO2 99%   BMI 19.22 kg/m    I/O last 3 completed shifts: In: 3416.1 [I.V.:464; Other:150; NG/GT:2790; IV Piggyback:12] Out: 2400 [Urine:2400] No intake/output data recorded.  SpO2: 99 % O2 Flow Rate (L/min): 40 L/min FiO2 (%): 30 %  Estimated body mass index is 19.22 kg/m as calculated from the following:   Height as of this encounter: 5' 5.98" (1.676 m).   Weight as of this encounter: 54 kg.  REVIEW OF SYSTEMS  10 point ROS done and is neg except as per subjective findings  PHYSICAL EXAMINATION: Physical Exam  HENT:  Head: Normocephalic and atraumatic.    Cardiovascular: Regular rhythm.  Pulmonary/Chest: Breath sounds normal. No accessory muscle usage. Tachypnea noted. No respiratory distress.  Abdominal: Bowel sounds are normal. He exhibits no distension, no fluid wave and no ascites. There is no abdominal tenderness.  Neurological: He is alert.       MEDICATIONS: I have reviewed all medications and confirmed regimen as documented   CULTURE RESULTS   No results found for this or any previous visit (from the past 240 hour(s)).      BMP Latest Ref Rng & Units 07/21/2019 07/20/2019 07/19/2019  Glucose 70 - 99 mg/dL 151(H) 110(H) 132(H)  BUN 6 - 20 mg/dL 18 16 20   Creatinine 0.61 - 1.24 mg/dL 0.42(L) 0.43(L) 0.37(L)  Sodium 135 - 145 mmol/L 134(L) 136 136  Potassium 3.5 - 5.1 mmol/L 3.8 4.7 4.8  Chloride 98 - 111 mmol/L 90(L) 92(L) 93(L)  CO2 22 - 32 mmol/L 36(H) 36(H) 34(H)  Calcium 8.9 - 10.3 mg/dL 8.9 9.2 8.9         Indwelling Urinary Catheter continued, requirement due to   Reason to continue Indwelling Urinary Catheter strict Intake/Output monitoring for hemodynamic instability         Ventilator continued, requirement due to severe respiratory failure   Ventilator Sedation RASS 0 to -2      ASSESSMENT AND PLAN   Acute on chronic hypercarbic/hypoxic respiratory  failure COPD exacerbation s/ptracheostomy -mucopurulent exudation has resolved post antibiotics Bronchodilators: DuoNeb every 4 and as needed   Severe COPD stage IV  Now on ventilator -continue LAMA/LABA/ICS   Tracheitis associated with tracheostomy--resolved -S/p culture of mucopurulent exudate-abundant GPC and GPR consistent with overgrowth of flora -s/p Unasyn with significant improvement in discharge and malodorous smell - completed full course unasyn and s/p erythromycin    Acute encephalopathy-resolved Unresponsiveness Due to hypercarbia    Hypoglycemia-resolved Due to poor nutritional status Continue tube feeds Add D10 ICU hypoglycemia protocol Poor prognostic sign  Acute kidney injury  Avoid nephrotoxins Stable with no worsening Trend renal panel  Hyperkalemia Likely triggered by acidosis RESOLVED  ID Procalcitonin normal No infiltrate on chest x-ray No indication for antibiotics at present Continue to trend WBC/fever curve Had 1 single blood culture of MRSA likely contaminant [name] MRSA PCR negative  Abdominal distention Ileus CT abdomen and pelvis: No obstruction: No free air Findings likely due to ileus, constipation Bowel regimen  Prophylaxis: Lovenox Famotidine    Critical care provider statement:    Critical care  time (minutes):  34   Critical care time was exclusive of:  Separately billable procedures and  treating other patients   Critical care was necessary to treat or prevent imminent or  life-threatening deterioration of the following conditions:  Acute hypercapnic respiratory failure requiring tracheostomy   Critical care was time spent personally by me on the following  activities:  Development of treatment plan with patient or surrogate,  discussions with consultants, evaluation of patient's response to  treatment, examination of patient, obtaining history from patient or  surrogate, ordering and performing treatments  and interventions, ordering  and review of laboratory studies and re-evaluation of patient's condition   I assumed direction of critical care for this patient from another  provider in my specialty: no       Vida Rigger, M.D.  Pulmonary & Critical Care Medicine  [facility]

## 2019-07-21 NOTE — Progress Notes (Signed)
Called in to room by patient who was visibly anxious and short of breath. [name] SPO2 never dropped during this event. Patient stated that he needed to be suctioned.This RN suctioned the patient twice and removed thick tan sputum.The patient was still visibly anxious, therefore 1mg  of PRN ativan was administered and the patient was comforted and reassured. The shortness of breath and anxiety resolved after a few minutes with the above interventions. Will continue to monitor.  , RN

## 2019-07-21 NOTE — Progress Notes (Signed)
PHARMACY CONSULT NOTE  Pharmacy Consult for Electrolyte Monitoring and Replacement   Recent Labs: Potassium (mmol/L)  Date Value  07/21/2019 3.8  09/24/2013 4.6   Magnesium (mg/dL)  Date Value  12/18/5100 1.6 (L)   Calcium (mg/dL)  Date Value  58/52/7782 8.9   Calcium, Total (mg/dL)  Date Value  42/35/3614 8.9   Albumin (g/dL)  Date Value  43/15/4008 3.5   Phosphorus (mg/dL)  Date Value  67/61/9509 3.0   Sodium (mmol/L)  Date Value  07/21/2019 134 (L)  09/24/2013 138    Assessment: 49 year old male admitted after being found unresponsive and admitted with end stage COPD. Patient was intubated in the ED and transferred to the ICU. Patient with trach and G-tube.  Electrolytes: Goal: K ~4, Mag ~2.  All other electrolytes WNL  Patient currently on mag-ox 800 mg VT BID.   Will give magnesium sulfate 4g IV x 1 dose. Will check electrolytes with AM labs.    Glucose: Following CBGs q4h Patient restarted tube feeds 5/13   Currently on prednisone 40 mg daily x 4 doses Will continue to monitor.  Constipation: LBM 5/16.  Current orders for scheduled Miralax and Senokot-S.  Per MAR, multiple loose stools documented on 5/16, and patient refused laxatives on 5/17.  He has since began resuming Miralax x1 dose Senokot-S x3 doses.  Will monitor tomorrow.  Pharmacy will continue to monitor and adjust per consult.   Cherly Hensen, PharmD 07/21/2019 12:38 PM

## 2019-07-22 LAB — BASIC METABOLIC PANEL
Anion gap: 8 (ref 5–15)
BUN: 17 mg/dL (ref 6–20)
CO2: 37 mmol/L — ABNORMAL HIGH (ref 22–32)
Calcium: 8.9 mg/dL (ref 8.9–10.3)
Chloride: 91 mmol/L — ABNORMAL LOW (ref 98–111)
Creatinine, Ser: 0.46 mg/dL — ABNORMAL LOW (ref 0.61–1.24)
GFR calc Af Amer: >60 mL/min (ref >60)
GFR calc non Af Amer: >60 mL/min (ref >60)
Glucose, Bld: 92 mg/dL (ref 70–99)
Potassium: 4.1 mmol/L (ref 3.5–5.1)
Sodium: 136 mmol/L (ref 135–145)

## 2019-07-22 LAB — CBC WITH DIFFERENTIAL/PLATELET
Abs Immature Granulocytes: 0.07 10*3/uL (ref 0.00–0.07)
Basophils Absolute: 0 10*3/uL (ref 0.0–0.1)
Basophils Relative: 0 %
Eosinophils Absolute: 0.1 10*3/uL (ref 0.0–0.5)
Eosinophils Relative: 1 %
HCT: 29 % — ABNORMAL LOW (ref 39.0–52.0)
Hemoglobin: 9.5 g/dL — ABNORMAL LOW (ref 13.0–17.0)
Immature Granulocytes: 1 %
Lymphocytes Relative: 12 %
Lymphs Abs: 1.3 10*3/uL (ref 0.7–4.0)
MCH: 31 pg (ref 26.0–34.0)
MCHC: 32.8 g/dL (ref 30.0–36.0)
MCV: 94.8 fL (ref 80.0–100.0)
Monocytes Absolute: 1.2 10*3/uL — ABNORMAL HIGH (ref 0.1–1.0)
Monocytes Relative: 11 %
Neutro Abs: 8.1 10*3/uL — ABNORMAL HIGH (ref 1.7–7.7)
Neutrophils Relative %: 75 %
Platelets: 126 10*3/uL — ABNORMAL LOW (ref 150–400)
RBC: 3.06 MIL/uL — ABNORMAL LOW (ref 4.22–5.81)
RDW: 13.6 % (ref 11.5–15.5)
WBC: 10.8 10*3/uL — ABNORMAL HIGH (ref 4.0–10.5)
nRBC: 0 % (ref 0.0–0.2)

## 2019-07-22 LAB — MAGNESIUM: Magnesium: 1.9 mg/dL (ref 1.7–2.4)

## 2019-07-22 LAB — GLUCOSE, CAPILLARY
Glucose-Capillary: 105 mg/dL — ABNORMAL HIGH (ref 70–99)
Glucose-Capillary: 120 mg/dL — ABNORMAL HIGH (ref 70–99)
Glucose-Capillary: 126 mg/dL — ABNORMAL HIGH (ref 70–99)
Glucose-Capillary: 133 mg/dL — ABNORMAL HIGH (ref 70–99)
Glucose-Capillary: 89 mg/dL (ref 70–99)

## 2019-07-22 LAB — PHOSPHORUS: Phosphorus: 3.4 mg/dL (ref 2.5–4.6)

## 2019-07-22 NOTE — Progress Notes (Signed)
PT Cancellation Note  Patient Details Name: Chris Case MRN: [id number] DOB: [birth year]   Cancelled Treatment:    Reason Eval/Treat Not Completed: Medical issues which prohibited therapy Spoke with nurse who reports that "He has his good days and bad days, today was not a good day."  Apparently we tolerated weaning from vent to HFNC for a while today but is now back on vent.  Nursing says he "got zonked" sitting at EOB earlier today and she suggests that we hold PT/activity for the rest of the day.  [name], DPT 07/22/2019, 5:18 PM

## 2019-07-22 NOTE — Progress Notes (Signed)
Pt placed back on vent since patient developed SOB and desaturations, patient anxious but tolerating well on vent.

## 2019-07-22 NOTE — Progress Notes (Signed)
Pt calm in no distress on Vent, Weaned to [employer] at 30% at [employer]. Tolerating well at this time, RN and MD made aware. Will continue to monitor.

## 2019-07-22 NOTE — Progress Notes (Addendum)
The patient remains on the ventilator via trach and is currently resting in bed with no apparent distress. He is alert and oriented x4. He is on no sedation, and is still receiving Osmolite 1.5 cal at 75 mL/hr through his peg tube. Patient was switched to pressure support during the day for a few hours, but was switched back to a set respiratory rate on the ventilator after he began to tire prior to this RN's shift. At 1940, he complained of 5/10 buttocks pain, and he was treated with 1 percocet tablet, which provided relief. He also received morphine 2mg  for pain at 0002. The patient declined his CHG bath tonight. He transferred once to the [facility] and back to the bed. All vitals are WDL. Will continue to monitor.   [facility], RN

## 2019-07-22 NOTE — Progress Notes (Signed)
PHARMACY CONSULT NOTE  Pharmacy Consult for Electrolyte Monitoring and Replacement   Recent Labs: Potassium (mmol/L)  Date Value  07/22/2019 4.1  09/24/2013 4.6   Magnesium (mg/dL)  Date Value  63/89/3734 1.9   Calcium (mg/dL)  Date Value  28/76/8115 8.9   Calcium, Total (mg/dL)  Date Value  72/62/0355 8.9   Albumin (g/dL)  Date Value  97/41/6384 3.5   Phosphorus (mg/dL)  Date Value  53/64/6803 3.4   Sodium (mmol/L)  Date Value  07/22/2019 136  09/24/2013 138    Assessment: 49 year old male admitted after being found unresponsive and admitted with end stage COPD. Patient was intubated in the ED and transferred to the ICU. Patient with trach and G-tube.  Electrolytes: Goal: K ~4, Mag ~2.  All other electrolytes WNL  Patient currently on mag-ox 800 mg VT BID. No other replacement indicated at this time. Will check electrolytes with AM labs.    Glucose: Following CBGs q4h Patient on OSMOLITE per tube at 75 mL/hr    Patient completed prednisone 40 mg daily x 4 doses this AM Will continue to monitor.  Constipation: LBM 5/19. Patient currently taking Miralax per tube QD and Senokot-S per tube BID. Will monitor tomorrow.  Pharmacy will continue to monitor and adjust per consult.   Delfin Gant, PharmD 07/22/2019 11:35 AM

## 2019-07-22 NOTE — Progress Notes (Signed)
Occupational Therapy Treatment Patient Details Name: Chris Case MRN: [id number] DOB: 1970-07-03 Today's Date: 07/22/2019    History of present illness Chris Case is a 48yoM who comes to [facility] on 4/13 after being found unresponsive. Pt here 29 May 2019 through 03 June 2019. Pt required ventilation upon arrival, failed extubation on 4/16, transitioned to tach on 4/20. Pt has required NGT decompression d/t ABD distension, underwent  PEG placement and lysis adhesions 4/22. 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 Pt has since been trialed on PMV but has not been off vent support long enough to trial much, limited tolerance on tach collar.   OT comments  Pt seen for OT treatment this date to f/u re: safety with self care ADLs/ADL mobility and re-assess OT goals. Pt willing to participate as he always is, but RN does reports he's had somewhat of a rough day. Rather than attempting transfers, OT opts to start slow and just sit EOB. Pt able to come to EOB sitting with supv as has been his most current fxl level, but he is noted to exhibit decreased sitting balance, opting to lean on L UE. Pt appears to have decreased tolerance at this time as well as evidenced by increased WOB. OT notes that pt's sats drop somewhat (~96% on vent at start of session) with static sitting and no activity. Pt is insisting on continuing to sit up stating that it feels good and OT permits for ~3 mins, but ultimately, pt's saturation continues to steadily decline and OT has to insist on assisting him back to bed. He requires MIN A to get back to bed which is higher level of assist than usual. Pt requires HOB elevated to full extent. OT immediately notifies RN that he desat to 84% in sitting and RN presents to administer medications. Ultimately, this was a less fruitful treatment session than sessions past. Will continue to assess for appropriateness of goals given some level of  regression 2/2 tolerance this date. Anticipate LTACH remains most appropriate d/c recommendation at this time.     Follow Up Recommendations  LTACH;SNF    Equipment Recommendations  3 in 1 bedside commode    Recommendations for Other Services      Precautions / Restrictions Precautions Precautions: Fall Precaution Comments: monitor O2, HR, and BP; trach collar vs mechanical ventilation; HOB >30 degrees; NPO; PEG tube; R PICC line Restrictions Weight Bearing Restrictions: No       Mobility Bed Mobility Overal bed mobility: Needs Assistance Bed Mobility: Sit to Supine     Supine to sit: Supervision;HOB elevated Sit to supine: Min assist   General bed mobility comments: requires increased assist back to bed for LEs this date d/t decreased tolerance for today's therapy session. Pt with limited tolerance in sitting. spO2 drops to 84% in static sitting and pt trying to request to keep sitting, but OT ultimately has to insist on helping pt back to bed. OT notifies RN about sats and pt status. She presents to administer Ativan.  Transfers                 General transfer comment: deferred this date d/t patient with decreased tolerance noted this date and increasd agonal breathing just in static sitting w/o activity.    Balance Overall balance assessment: Needs assistance Sitting-balance support: Single extremity supported Sitting balance-Leahy Scale: Fair Sitting balance - Comments: supporting himself in sitting with L UE this date, leaning, decreased  tolerance, sit lasts ~3 mins                                   ADL either performed or assessed with clinical judgement   ADL                                         General ADL Comments: Pt unable to tolerate donig any ADLs while seated this date d/t increased WOB while on vent.     Vision Baseline Vision/History: No visual deficits Patient Visual Report: No change from baseline      Perception     Praxis      Cognition Arousal/Alertness: Awake/alert Behavior During Therapy: WFL for tasks assessed/performed;Impulsive Overall Cognitive Status: Within Functional Limits for tasks assessed                                 General Comments: pt somewhat impulsive in terms of pushing himself too hard with therapy despite clinical presntation and therapist's advice.        Exercises Other Exercises Other Exercises: Pt wanting to do word searches in sitting. OT prints and brings some for patient. However, once pt to EOB sitting, he demos very limited tolerance, ~3 mins with gradual O2 desaturation. Pt wanting to continue sitting, stating "let me sit here for a bit." But OT finally has to insist when pt drops to 84% despite vent support. He is returned to bed. RN notified and brings Ativan.   Shoulder Instructions       General Comments      Pertinent Vitals/ Pain       Pain Assessment: No/denies pain  Home Living                                          Prior Functioning/Environment              Frequency  Min 1X/week        Progress Toward Goals  OT Goals(current goals can now be found in the care plan section)  Progress towards OT goals: OT to reassess next treatment  Acute Rehab OT Goals Patient Stated Goal: to go home OT Goal Formulation: With patient Time For Goal Achievement: 08/05/19 Potential to Achieve Goals: Good  Plan Discharge plan remains appropriate;Frequency remains appropriate    Co-evaluation                 AM-PAC OT "6 Clicks" Daily Activity     Outcome Measure   Help from another person eating meals?: None Help from another person taking care of personal grooming?: None Help from another person toileting, which includes using toliet, bedpan, or urinal?: A Lot Help from another person bathing (including washing, rinsing, drying)?: A Lot Help from another person to put on and taking  off regular upper body clothing?: A Little Help from another person to put on and taking off regular lower body clothing?: A Little 6 Click Score: 18    End of Session    OT Visit Diagnosis: Other abnormalities of gait and mobility (R26.89);Muscle weakness (generalized) (M62.81)   Activity Tolerance Treatment limited secondary to medical complications (  Comment)   Patient Left in bed;with call bell/phone within reach;with nursing/sitter in room(surse present to administer ativan)   Nurse Communication Mobility status        Time: 1282-0813 OT Time Calculation (min): 28 min  Charges: OT General Charges $OT Visit: 1 Visit OT Treatments $Therapeutic Activity: 23-37 mins   Gerrianne Scale, Clark, OTR/L ascom [phone] 07/22/19, 5:19 PM

## 2019-07-22 NOTE — Progress Notes (Signed)
CRITICAL CARE NOTE  49 year old former smoker with a history very severe COPD stage IV, who presented via EMS after being found down and unresponsive.  Apparently the patient's mother placed a call to EMS.  The patient has a longstanding history of COPD and noncompliance with his oxygen and CPAP.  History could not be obtained from the patient as there was no family available and the patient had been intubated and was mechanically ventilated in the ED.  Of note he had recently been admitted to [facility] 29 May 2019 through 03 June 2019.  He had been evaluated by palliative care during this admission and actually had a DNR order.   Patient also had a 2D echo  with pulmonary hypertension consistent with cor pulmonale.  Records review: Patient has been following with Dr. Ned Clines at [facility].  Last known PFTs 11 September 2017.  FEV1 0.52 L or 20% predicted, FVC 1.49 L or 45% of predicted, FEV1/FVC 35%.  FEF 25-75 6%.  DLCO was 33%.  This is consistent with very severe COPD.  Events: 4/13 admitted for severe resp failure from COPD 4/14 remains critically ill on vent 4/15 remains critically ill, severe COPD, failed SAT/SBT 4/16 extubated yesterday, had to be reintubated remains on ventilator 4/17 sedated on the ventilator 4/18 abdominal distention, CT abdomen and pelvis consistent with ileus and constipation 4/19-patietn remains on ventialtor awainting trach 4/20 - patient is on vent with trach, he had episodes of aggitation today but eventually improved  4/21 - patient with significant distension of abdomen, concern for aerophagia in NIV.  There is mucopurulent exudation from trache this was cultures and patient placed on Unasyn for tracheitis 06/24/19 -trache site improved with less malodorous pus draining, NGT with decompression worked well abdomen is less distended. S/p PEG 06/25/19- patient had some anxiety throughout day today, we have started low dose anxiolytic. His PEG is now being  utilized. He has some placement issues with social work due to insurance and we are working on this now.  06/26/19-Patient had no events today, we are waiting for placement now from transitional care team 06/27/19- Patient is calm this morining, abdomen is mildy distended, awaiting placement to [facility] 4/26 plan for PS/TCT 4/27 Pt failed trach collar twice today. Became visibly distressed, diaphoretic, o2 drops, and accessory muscle use.  4/28 increased WOB, resp distress, using accessory muscles to breath while on vent 4/29 patient placed on PS mode, given BD therapy, alert and awake, was able to communicate with PM valve 07/02/19 -trache changed to cuffed size 6 without incident, patient reports more comfort with improved vocalization.  07/03/19- no overnight events, patient sitting up in bed in no distress.  5/3severe hypoxia and increased WOB, placed back on full vent support 5/4 severe resp distress, can NOT tolerate PS mode, severe resp failure End stage COPD 5/5 severe resp distress, can not tolerate TCT, severe end stage COPD 5/7sitting up in chair, able to do some pressure support trials but no trach collar trials. Still appears very tachypneic 5/8 cannot tolerate trach collar, few hours of PS only 5/9remains vent dependent. Minimal progress with weaning 5/10 increased WOB near cardiac arrest, distended abd 5/12on PS mode 5/14 back on PC mode, very high levels of anxiety 5/16 patient is agreeable to home with hospice after further discourse regarding his recurrent respiratory distress while hospitalized which prevents [place] from discharge.  5/18- discussed DC plan with CM and SW today. Palliative care on case 07/21/19 - patient had episode of respiratory  distress again when off ventilator after few hours. This will be difficult for placement and he will likely be brought back to hospital very quickly.  5/20- patient was transitioned to HFNC today he did well with it for few hours and then  again had to be placed on MV.  He is still waiting on placment.     CC  Severe COPD Severe resp failure     BP (!) 155/102   Pulse 100   Temp 98.6 F (37 C) (Oral)   Resp 17   Ht 5' 5.98" (1.676 m)   Wt 54 kg   SpO2 95%   BMI 19.22 kg/m    I/O last 3 completed shifts: In: -  Out: 2525 [Urine:2525] No intake/output data recorded.  SpO2: 95 % O2 Flow Rate (L/min): 55 L/min FiO2 (%): 30 %  Estimated body mass index is 19.22 kg/m as calculated from the following:   Height as of this encounter: 5' 5.98" (1.676 m).   Weight as of this encounter: 54 kg.  REVIEW OF SYSTEMS  10 point ROS done and is neg except as per subjective findings  PHYSICAL EXAMINATION: Physical Exam  HENT:  Head: Normocephalic and atraumatic.    Cardiovascular: Regular rhythm.  Pulmonary/Chest: Breath sounds normal. No accessory muscle usage. Tachypnea noted. No respiratory distress.  Abdominal: Bowel sounds are normal. He exhibits no distension, no fluid wave and no ascites. There is no abdominal tenderness.  Neurological: He is alert.       MEDICATIONS: I have reviewed all medications and confirmed regimen as documented   CULTURE RESULTS   No results found for this or any previous visit (from the past 240 hour(s)).      BMP Latest Ref Rng & Units 07/22/2019 07/21/2019 07/20/2019  Glucose 70 - 99 mg/dL 92 151(H) 110(H)  BUN 6 - 20 mg/dL 17 18 16   Creatinine 0.61 - 1.24 mg/dL 0.46(L) 0.42(L) 0.43(L)  Sodium 135 - 145 mmol/L 136 134(L) 136  Potassium 3.5 - 5.1 mmol/L 4.1 3.8 4.7  Chloride 98 - 111 mmol/L 91(L) 90(L) 92(L)  CO2 22 - 32 mmol/L 37(H) 36(H) 36(H)  Calcium 8.9 - 10.3 mg/dL 8.9 8.9 9.2         Indwelling Urinary Catheter continued, requirement due to   Reason to continue Indwelling Urinary Catheter strict Intake/Output monitoring for hemodynamic instability         Ventilator continued, requirement due to severe respiratory failure   Ventilator Sedation RASS  0 to -2      ASSESSMENT AND PLAN   Acute on chronic hypercarbic/hypoxic respiratory failure COPD exacerbation s/ptracheostomy -mucopurulent exudation has resolved post antibiotics Bronchodilators: DuoNeb every 4 and as needed   Severe COPD stage IV  Now on ventilator -continue LAMA/LABA/ICS   Tracheitis associated with tracheostomy--resolved -S/p culture of mucopurulent exudate-abundant GPC and GPR consistent with overgrowth of flora -s/p Unasyn with significant improvement in discharge and malodorous smell - completed full course unasyn and s/p erythromycin     Acute encephalopathy-resolved Unresponsiveness Due to hypercarbia    Hypoglycemia-resolved Due to poor nutritional status Continue tube feeds Add D10 ICU hypoglycemia protocol Poor prognostic sign  Acute kidney injury  Avoid nephrotoxins Stable with no worsening Trend renal panel  Hyperkalemia Likely triggered by acidosis RESOLVED  ID Procalcitonin normal No infiltrate on chest x-ray No indication for antibiotics at present Continue to trend WBC/fever curve Had 1 single blood culture of MRSA likely contaminant [name] MRSA PCR negative  Abdominal distention Ileus CT  abdomen and pelvis: No obstruction: No free air Findings likely due to ileus, constipation Bowel regimen  Prophylaxis: Lovenox Famotidine    Critical care provider statement:    Critical care time (minutes):  34   Critical care time was exclusive of:  Separately billable procedures and  treating other patients   Critical care was necessary to treat or prevent imminent or  life-threatening deterioration of the following conditions:  Acute hypercapnic respiratory failure requiring tracheostomy   Critical care was time spent personally by me on the following  activities:  Development of treatment plan with patient or surrogate,  discussions with consultants, evaluation of patient's response to  treatment, examination  of patient, obtaining history from patient or  surrogate, ordering and performing treatments and interventions, ordering  and review of laboratory studies and re-evaluation of patient's condition   I assumed direction of critical care for this patient from another  provider in my specialty: no       Vida Rigger, M.D.  Pulmonary & Critical Care Medicine  [facility]

## 2019-07-23 LAB — GLUCOSE, CAPILLARY
Glucose-Capillary: 138 mg/dL — ABNORMAL HIGH (ref 70–99)
Glucose-Capillary: 107 mg/dL — ABNORMAL HIGH (ref 70–99)
Glucose-Capillary: 89 mg/dL (ref 70–99)
Glucose-Capillary: 125 mg/dL — ABNORMAL HIGH (ref 70–99)
Glucose-Capillary: 127 mg/dL — ABNORMAL HIGH (ref 70–99)
Glucose-Capillary: 110 mg/dL — ABNORMAL HIGH (ref 70–99)

## 2019-07-23 LAB — PHOSPHORUS: Phosphorus: 4.1 mg/dL (ref 2.5–4.6)

## 2019-07-23 LAB — CBC WITH DIFFERENTIAL/PLATELET
Abs Immature Granulocytes: 0.09 10*3/uL — ABNORMAL HIGH (ref 0.00–0.07)
Basophils Absolute: 0 10*3/uL (ref 0.0–0.1)
Basophils Relative: 0 %
Eosinophils Absolute: 0.1 10*3/uL (ref 0.0–0.5)
Eosinophils Relative: 1 %
HCT: 31.5 % — ABNORMAL LOW (ref 39.0–52.0)
Hemoglobin: 9.7 g/dL — ABNORMAL LOW (ref 13.0–17.0)
Immature Granulocytes: 1 %
Lymphocytes Relative: 10 %
Lymphs Abs: 1.2 10*3/uL (ref 0.7–4.0)
MCH: 30.5 pg (ref 26.0–34.0)
MCHC: 30.8 g/dL (ref 30.0–36.0)
MCV: 99.1 fL (ref 80.0–100.0)
Monocytes Absolute: 0.9 10*3/uL (ref 0.1–1.0)
Monocytes Relative: 8 %
Neutro Abs: 9.5 10*3/uL — ABNORMAL HIGH (ref 1.7–7.7)
Neutrophils Relative %: 80 %
Platelets: 131 10*3/uL — ABNORMAL LOW (ref 150–400)
RBC: 3.18 MIL/uL — ABNORMAL LOW (ref 4.22–5.81)
RDW: 13.6 % (ref 11.5–15.5)
Smear Review: NORMAL
WBC: 11.7 10*3/uL — ABNORMAL HIGH (ref 4.0–10.5)
nRBC: 0 % (ref 0.0–0.2)

## 2019-07-23 LAB — BASIC METABOLIC PANEL
Anion gap: 8 (ref 5–15)
BUN: 22 mg/dL — ABNORMAL HIGH (ref 6–20)
CO2: 38 mmol/L — ABNORMAL HIGH (ref 22–32)
Calcium: 9 mg/dL (ref 8.9–10.3)
Chloride: 90 mmol/L — ABNORMAL LOW (ref 98–111)
Creatinine, Ser: 0.38 mg/dL — ABNORMAL LOW (ref 0.61–1.24)
GFR calc Af Amer: >60 mL/min (ref >60)
GFR calc non Af Amer: >60 mL/min (ref >60)
Glucose, Bld: 133 mg/dL — ABNORMAL HIGH (ref 70–99)
Potassium: 4.1 mmol/L (ref 3.5–5.1)
Sodium: 136 mmol/L (ref 135–145)

## 2019-07-23 LAB — MAGNESIUM: Magnesium: 1.7 mg/dL (ref 1.7–2.4)

## 2019-07-23 MED ORDER — MAGNESIUM SULFATE 2 GM/50ML IV SOLN
2.0000 g | Freq: Once | INTRAVENOUS | Status: AC
  Administered 2019-07-23: 2 g via INTRAVENOUS
  Filled 2019-07-23: qty 50

## 2019-07-23 MED ORDER — LORAZEPAM 0.5 MG PO TABS
0.5000 mg | ORAL_TABLET | Freq: Three times a day (TID) | ORAL | Status: DC
  Administered 2019-07-23 – 2019-07-28 (×16): 0.5 mg via ORAL
  Filled 2019-07-23 (×16): qty 1

## 2019-07-23 NOTE — Progress Notes (Signed)
PT Cancellation Note  Patient Details Name: Chris Case MRN: [id number] DOB: [birth year]   Cancelled Treatment:    Reason Eval/Treat Not Completed: Medical issues which prohibited therapy.  Chart reviewed.  Pt sleeping upon PT arriving to unit.  Nurse reports pt not having a good day in terms of respiratory status and recommending holding PT at this time.  Will hold PT at this time and plan to re-evaluate pt on Monday as appropriate.  Hendricks Limes, PT 07/23/19, 1:27 PM

## 2019-07-23 NOTE — Progress Notes (Signed)
The patient remains on the ventilator via trach and is currently resting in bed with no apparent distress. He remains alert and oriented x4. He is on no sedation, and is still receiving tube feed through his peg tube. Patient remained on a set respiratory rate on the ventilator for most of the day, and he is on his set rate overnight. At 1942, he complained of 5/10 buttocks pain, and he was treated with IV morphine 2mg . The patient declined his CHG bath tonight as he is very tired from his day. All vitals are WDL. Will continue to monitor.  , RN

## 2019-07-23 NOTE — TOC Progression Note (Signed)
Transition of Care [facility]) - Progression Note    Patient Details  Name: Chris Case MRN: [id number] Date of Birth: 10/08/1970  Transition of Care [facility]) CM/SW Contact  Liliana Cline, LCSW Phone Number: 07/23/2019, 1:46 PM  Clinical Narrative:   CSW called [facility] to follow up.  [facility]- Left voicemail for Admissions Worker Prem to follow up about referral sent earlier this week and if they have any bed availability or expected bed availability.  [facility]- Called and spoke to [employer]. Damian Leavell reported she will leave another message for their other Admissions Worker Clydie Braun. CSW also re faxed referral directly to [facility] for her to review. She reported she or Clydie Braun will follow up after they review.     Expected Discharge Plan: Home w Home Health Services Barriers to Discharge: Continued Medical Work up  Expected Discharge Plan and Services Expected Discharge Plan: Home w Home Health Services     Post Acute Care Choice: Home Health Living arrangements for the past 2 months: Single Family Home                             HH Agency: [facility]     Representative spoke with at [facility] Agency: Feliberto Gottron   Social Determinants of Health (SDOH) Interventions    Readmission Risk Interventions Readmission Risk Prevention Plan 06/29/2019  Transportation Screening Complete  PCP or Specialist Appt within 3-5 Days Complete  HRI or Home Care Consult Complete  Palliative Care Screening Complete  Medication Review (RN Care Manager) Complete  Some recent data might be hidden

## 2019-07-23 NOTE — Progress Notes (Signed)
PHARMACY CONSULT NOTE  Pharmacy Consult for Electrolyte Monitoring and Replacement   Recent Labs: Potassium (mmol/L)  Date Value  07/23/2019 4.1  09/24/2013 4.6   Magnesium (mg/dL)  Date Value  08/67/6195 1.7   Calcium (mg/dL)  Date Value  09/32/6712 9.0   Calcium, Total (mg/dL)  Date Value  45/80/9983 8.9   Albumin (g/dL)  Date Value  38/25/0539 3.5   Phosphorus (mg/dL)  Date Value  76/73/4193 4.1   Sodium (mmol/L)  Date Value  07/23/2019 136  09/24/2013 138    Assessment: 49 year old male admitted after being found unresponsive and admitted with end stage COPD. Patient was intubated in the ED and transferred to the ICU. Patient with trach and G-tube.  Electrolytes: Goal: K ~4, Mag ~2.  All other electrolytes WNL  Patient currently on mag-ox 800 mg VT BID.   Will give magnesium sulfate 2g IV x 1 dose. Will check electrolytes with AM labs.    Glucose: Following CBGs q4h. Patient restarted tube feeds 5/13.   Patient currently finished course of prednisone. Will continue to monitor.  Constipation: Last BM 5/20.  Current orders for scheduled Miralax and Senokot-S.   Pharmacy will continue to monitor and adjust per consult.   Cherly Hensen, PharmD 07/23/2019 1:54 PM

## 2019-07-23 NOTE — Progress Notes (Addendum)
CRITICAL CARE NOTE  BACKGROUND: 49 year old former smoker with a history very severe COPD stage IV, who presented via EMS after being found down and unresponsive.  Apparently the patient's mother placed a call to EMS.  The patient has a longstanding history of COPD and noncompliance with his oxygen and CPAP.  History could not be obtained from the patient as there was no family available and the patient had been intubated and was mechanically ventilated in the ED.  Of note he had recently been admitted to [facility] 29 May 2019 through 03 June 2019.  He had been evaluated by palliative care during this admission and actually had a DNR order.   Patient also had a 2D echo  with pulmonary hypertension consistent with cor pulmonale.  Admit records review: Patient has been following with Dr. Ned Clines at [facility].  Last known PFTs 11 September 2017.  FEV1 0.52 L or 20% predicted, FVC 1.49 L or 45% of predicted, FEV1/FVC 35%.  FEF 25-75 6%.  DLCO was 33%.  This is consistent with very severe COPD.  Events: 4/13 admitted for severe resp failure from COPD 4/14 remains critically ill on vent 4/15 remains critically ill, severe COPD, failed SAT/SBT 4/16 extubated yesterday, had to be reintubated remains on ventilator 4/17 sedated on the ventilator 4/18 abdominal distention, CT abdomen and pelvis consistent with ileus and constipation 4/19-patietn remains on ventialtor awainting trach 4/20 - patient is on vent with trach, he had episodes of aggitation today but eventually improved  4/21 - patient with significant distension of abdomen, concern for aerophagia in NIV.  There is mucopurulent exudation from trache this was cultures and patient placed on Unasyn for tracheitis 06/24/19 -trache site improved with less malodorous pus draining, NGT with decompression worked well abdomen is less distended. S/p PEG 06/25/19- patient had some anxiety throughout day today, we have started low dose anxiolytic. His PEG  is now being utilized. He has some placement issues with social work due to insurance and we are working on this now.  06/26/19-Patient had no events today, we are waiting for placement now from transitional care team 06/27/19- Patient is calm this morining, abdomen is mildy distended, awaiting placement to [facility] 4/26 plan for PS/TCT 4/27 Pt failed trach collar twice today. Became visibly distressed, diaphoretic, o2 drops, and accessory muscle use.  4/28 increased WOB, resp distress, using accessory muscles to breath while on vent 4/29 patient placed on PS mode, given BD therapy, alert and awake, was able to communicate with PM valve 07/02/19 -trache changed to cuffed size 6 without incident, patient reports more comfort with improved vocalization.  07/03/19- no overnight events, patient sitting up in bed in no distress.  5/3severe hypoxia and increased WOB, placed back on full vent support 5/4 severe resp distress, can NOT tolerate PS mode, severe resp failure End stage COPD 5/5 severe resp distress, can not tolerate TCT, severe end stage COPD 5/7sitting up in chair, able to do some pressure support trials but no trach collar trials. Still appears very tachypneic 5/8 cannot tolerate trach collar, few hours of PS only 5/9remains vent dependent. Minimal progress with weaning 5/10 increased WOB near cardiac arrest, distended abd 5/12on PS mode 5/14 back on PC mode, very high levels of anxiety 5/16 patient is agreeable to home with hospice after further discourse regarding his recurrent respiratory distress while hospitalized which prevents [place] from discharge.  5/18- discussed DC plan with CM and SW today. Palliative care on case 07/21/19 - patient had episode  of respiratory distress again when off ventilator after few hours. This will be difficult for placement and he will likely be brought back to hospital very quickly.  5/20- patient was transitioned to HFNC today he did well with it for few hours  and then again had to be placed on MV.  He is still waiting on placment.  5/21-severe anxiety, anxiolytics adjusted   Problems: Severe COPD Acute on chronic resp failure   BP 100/76 (BP Location: Left Arm)   Pulse 90   Temp 98.4 F (36.9 C) (Oral)   Resp 15   Ht 5' 5.98" (1.676 m)   Wt 55.7 kg   SpO2 100%   BMI 19.83 kg/m    I/O last 3 completed shifts: In: 70 [IV Piggyback:50] Out: 2125 [Urine:2125] Total I/O In: 35 [I.V.:5; NG/GT:30] Out: -   SpO2: 100 % O2 Flow Rate (L/min): 55 L/min FiO2 (%): 30 %  Estimated body mass index is 19.83 kg/m as calculated from the following:   Height as of this encounter: 5' 5.98" (1.676 m).   Weight as of this encounter: 55.7 kg.  REVIEW OF SYSTEMS  10 point ROS done and is neg except as per subjective findings  PHYSICAL EXAMINATION: GENERAL: Thin male, on vent, using accessories even with vent support. HEAD: Normocephalic, atraumatic.  EYES: Pupils equal, round, reactive to light.  No scleral icterus.  MOUTH: Oral mucosa moist.  No thrush. NECK: Supple.  Tracheostomy in place, clean. PULMONARY: Very distant breath sounds, coarse, no other adventitious sounds, poor air movement. CARDIOVASCULAR: S1 and S2. Regular rate and rhythm.  No rubs murmurs gallops heard. GASTROINTESTINAL: Soft, nontender, tolerating tube feeds. MUSCULOSKELETAL: No joint deformity, no clubbing, no edema.  NEUROLOGIC: Awake, alert, interacts.  Follows commands.  No overt focal deficit. SKIN: Intact,warm,dry. PSYCH: Anxious.  Pressure Injury 06/24/19 Buttocks Right Stage 3 -  Full thickness tissue loss. Subcutaneous fat may be visible but bone, tendon or muscle are NOT exposed. (Active)  06/24/19 0200  Location: Buttocks  Location Orientation: Right  Staging: Stage 3 -  Full thickness tissue loss. Subcutaneous fat may be visible but bone, tendon or muscle are NOT exposed.  Wound Description (Comments):   Present on Admission: No         MEDICATIONS: I have reviewed all medications and confirmed regimen as documented   CULTURE RESULTS   No results found for this or any previous visit (from the past 240 hour(s)).      BMP Latest Ref Rng & Units 07/23/2019 07/22/2019 07/21/2019  Glucose 70 - 99 mg/dL 133(H) 92 151(H)  BUN 6 - 20 mg/dL 22(H) 17 18  Creatinine 0.61 - 1.24 mg/dL 0.38(L) 0.46(L) 0.42(L)  Sodium 135 - 145 mmol/L 136 136 134(L)  Potassium 3.5 - 5.1 mmol/L 4.1 4.1 3.8  Chloride 98 - 111 mmol/L 90(L) 91(L) 90(L)  CO2 22 - 32 mmol/L 38(H) 37(H) 36(H)  Calcium 8.9 - 10.3 mg/dL 9.0 8.9 8.9         Indwelling Urinary Catheter  removed  Reason to continue Indwelling Urinary Catheter         Ventilator continued, requirement due to severe respiratory failure   Ventilator Sedation RASS 0       ASSESSMENT AND PLAN   Acute on chronic hypercarbic/hypoxic respiratory failure COPD exacerbation s/ptracheostomy  Bronchodilators: DuoNeb every 4 and as needed Chronic vent support Failure to wean  Very severe COPD stage IV  Failure to wean continue LAMA/LABA/ICS Continue attempts at weaning Very poor  baseline function   Acute encephalopathy Unresponsiveness Due to hypercarbia RESOLVED   Hypoglycemia Resolved Was due to poor nutritional status on admit Respiratory muscle function demand Continue tube feeds ICU hypoglycemia protocol   Acute kidney injury  Avoid nephrotoxins Stable with no worsening Trend renal panel  Hyperkalemia RESOLVED Likely triggered by acidosis Periodic check on BMP   ID Procalcitonin normal No infiltrate on chest x-ray No indication for antibiotics at present Continue to trend WBC/fever curve Had 1 single blood culture of MRSA likely contaminant [name] MRSA PCR negative Tracheitis associated with tracheostomy RESOLVED  Abdominal distention Ileus CT abdomen and pelvis: No obstruction: No free air Findings likely due to ileus,  constipation Bowel regimen RESOLVED  Prophylaxis: Lovenox Famotidine  Nutrition Status: Nutrition Problem: Severe Malnutrition Etiology: chronic illness(COPD) Signs/Symptoms: severe fat depletion, severe muscle depletion Interventions: Refer to RD note for recommendations Severe malnutrition on admission, improving with tube feeds    Multidisciplinary rounds were performed with the ICU team  C. Danice Goltz, MD [place] [facility]  *This note was dictated using voice recognition software/Dragon.  Despite best efforts to proofread, errors can occur which can change the meaning.  Any change was purely unintentional.

## 2019-07-23 NOTE — Progress Notes (Signed)
Pt resting quietly in bed with no distress noted at this time. Pt has had a rough day with respiratory issues. Pt had several episodes of shob, struggling to breathe, and anxiety. Pt had to be bagged by RT a couple times today to gets O2 sats back up. Morphine and ativan was administered Q4H for pain and anxiety. Pt states he is okay now and seems comfortable.

## 2019-07-24 LAB — BASIC METABOLIC PANEL
Anion gap: 7 (ref 5–15)
BUN: 23 mg/dL — ABNORMAL HIGH (ref 6–20)
CO2: 39 mmol/L — ABNORMAL HIGH (ref 22–32)
Calcium: 9 mg/dL (ref 8.9–10.3)
Chloride: 89 mmol/L — ABNORMAL LOW (ref 98–111)
Creatinine, Ser: 0.49 mg/dL — ABNORMAL LOW (ref 0.61–1.24)
GFR calc Af Amer: >60 mL/min (ref >60)
GFR calc non Af Amer: >60 mL/min (ref >60)
Glucose, Bld: 125 mg/dL — ABNORMAL HIGH (ref 70–99)
Potassium: 4.1 mmol/L (ref 3.5–5.1)
Sodium: 135 mmol/L (ref 135–145)

## 2019-07-24 LAB — CBC WITH DIFFERENTIAL/PLATELET
Abs Immature Granulocytes: 0.16 10*3/uL — ABNORMAL HIGH (ref 0.00–0.07)
Basophils Absolute: 0.1 10*3/uL (ref 0.0–0.1)
Basophils Relative: 1 %
Eosinophils Absolute: 0.4 10*3/uL (ref 0.0–0.5)
Eosinophils Relative: 4 %
HCT: 29.4 % — ABNORMAL LOW (ref 39.0–52.0)
Hemoglobin: 9.3 g/dL — ABNORMAL LOW (ref 13.0–17.0)
Immature Granulocytes: 2 %
Lymphocytes Relative: 17 %
Lymphs Abs: 1.8 10*3/uL (ref 0.7–4.0)
MCH: 30.6 pg (ref 26.0–34.0)
MCHC: 31.6 g/dL (ref 30.0–36.0)
MCV: 96.7 fL (ref 80.0–100.0)
Monocytes Absolute: 1 10*3/uL (ref 0.1–1.0)
Monocytes Relative: 10 %
Neutro Abs: 7.2 10*3/uL (ref 1.7–7.7)
Neutrophils Relative %: 66 %
Platelets: 119 10*3/uL — ABNORMAL LOW (ref 150–400)
RBC: 3.04 MIL/uL — ABNORMAL LOW (ref 4.22–5.81)
RDW: 13.9 % (ref 11.5–15.5)
WBC: 10.6 10*3/uL — ABNORMAL HIGH (ref 4.0–10.5)
nRBC: 0 % (ref 0.0–0.2)

## 2019-07-24 LAB — GLUCOSE, CAPILLARY
Glucose-Capillary: 112 mg/dL — ABNORMAL HIGH (ref 70–99)
Glucose-Capillary: 126 mg/dL — ABNORMAL HIGH (ref 70–99)
Glucose-Capillary: 136 mg/dL — ABNORMAL HIGH (ref 70–99)
Glucose-Capillary: 92 mg/dL (ref 70–99)
Glucose-Capillary: 99 mg/dL (ref 70–99)
Glucose-Capillary: 99 mg/dL (ref 70–99)

## 2019-07-24 LAB — PHOSPHORUS: Phosphorus: 4 mg/dL (ref 2.5–4.6)

## 2019-07-24 LAB — MAGNESIUM: Magnesium: 1.9 mg/dL (ref 1.7–2.4)

## 2019-07-24 NOTE — Progress Notes (Signed)
Took over care for patient approximately 1500- patient complaint of shortness of breath- was suctioned though still no relief- oxygen saturation maintained in the 90's- Respiratory changed out patient's inner cannula.  Patient given morphine and ativan- patient resting comfortably at this time.

## 2019-07-24 NOTE — Progress Notes (Signed)
CRITICAL CARE NOTE  BACKGROUND: 49 year old former smoker with a history very severe COPD stage IV, who presented via EMS after being found down and unresponsive.  Apparently the patient's mother placed a call to EMS.  The patient has a longstanding history of COPD and noncompliance with his oxygen and CPAP.  History could not be obtained from the patient as there was no family available and the patient had been intubated and was mechanically ventilated in the ED.  Of note he had recently been admitted to [facility] 29 May 2019 through 03 June 2019.  He had been evaluated by palliative care during this admission and actually had a DNR order.   Patient also had a 2D echo  with pulmonary hypertension consistent with cor pulmonale.  Admit records review: Patient has been following with Dr. Ned Clines at [facility].  Last known PFTs 11 September 2017.  FEV1 0.52 L or 20% predicted, FVC 1.49 L or 45% of predicted, FEV1/FVC 35%.  FEF 25-75 6%.  DLCO was 33%.  This is consistent with very severe COPD.  Events: 4/13 admitted for severe resp failure from COPD 4/14 remains critically ill on vent 4/15 remains critically ill, severe COPD, failed SAT/SBT 4/16 extubated yesterday, had to be reintubated remains on ventilator 4/17 sedated on the ventilator 4/18 abdominal distention, CT abdomen and pelvis consistent with ileus and constipation 4/19-patietn remains on ventialtor awainting trach 4/20 - patient is on vent with trach, he had episodes of aggitation today but eventually improved  4/21 - patient with significant distension of abdomen, concern for aerophagia in NIV.  There is mucopurulent exudation from trache this was cultures and patient placed on Unasyn for tracheitis 06/24/19 -trache site improved with less malodorous pus draining, NGT with decompression worked well abdomen is less distended. S/p PEG 06/25/19- patient had some anxiety throughout day today, we have started low dose anxiolytic. His PEG  is now being utilized. He has some placement issues with social work due to insurance and we are working on this now.  06/26/19-Patient had no events today, we are waiting for placement now from transitional care team 06/27/19- Patient is calm this morining, abdomen is mildy distended, awaiting placement to [facility] 4/26 plan for PS/TCT 4/27 Pt failed trach collar twice today. Became visibly distressed, diaphoretic, o2 drops, and accessory muscle use.  4/28 increased WOB, resp distress, using accessory muscles to breath while on vent 4/29 patient placed on PS mode, given BD therapy, alert and awake, was able to communicate with PM valve 07/02/19 -trache changed to cuffed size 6 without incident, patient reports more comfort with improved vocalization.  07/03/19- no overnight events, patient sitting up in bed in no distress.  5/3severe hypoxia and increased WOB, placed back on full vent support 5/4 severe resp distress, can NOT tolerate PS mode, severe resp failure End stage COPD 5/5 severe resp distress, can not tolerate TCT, severe end stage COPD 5/7sitting up in chair, able to do some pressure support trials but no trach collar trials. Still appears very tachypneic 5/8 cannot tolerate trach collar, few hours of PS only 5/9remains vent dependent. Minimal progress with weaning 5/10 increased WOB near cardiac arrest, distended abd 5/12on PS mode 5/14 back on PC mode, very high levels of anxiety 5/16 patient is agreeable to home with hospice after further discourse regarding his recurrent respiratory distress while hospitalized which prevents [place] from discharge.  5/18- discussed DC plan with CM and SW today. Palliative care on case 07/21/19 - patient had episode  of respiratory distress again when off ventilator after few hours. This will be difficult for placement and he will likely be brought back to hospital very quickly.  5/20- patient was transitioned to HFNC today he did well with it for few hours  and then again had to be placed on MV.  He is still waiting on placment.  5/21-severe anxiety, anxiolytics adjusted   Problems: Severe COPD Acute on chronic resp failure   BP (!) 152/112   Pulse 93   Temp 98.1 F (36.7 C) (Oral)   Resp (!) 31   Ht 5' 5.98" (1.676 m)   Wt 55.7 kg   SpO2 100%   BMI 19.83 kg/m    I/O last 3 completed shifts: In: 7485 [I.V.:55; NG/GT:7380; IV Piggyback:50] Out: 2075 [Urine:2075] Total I/O In: 717.5 [I.V.:10; NG/GT:707.5] Out: -   SpO2: 100 % O2 Flow Rate (L/min): 55 L/min FiO2 (%): 30 %  Estimated body mass index is 19.83 kg/m as calculated from the following:   Height as of this encounter: 5' 5.98" (1.676 m).   Weight as of this encounter: 55.7 kg.  REVIEW OF SYSTEMS  10 point ROS done and is neg except as per subjective findings, difficult to assess due to trach status.  PHYSICAL EXAMINATION: GENERAL: Thin male, on vent, using accessories even with vent support. HEAD: Normocephalic, atraumatic.  EYES: Pupils equal, round, reactive to light.  No scleral icterus.  MOUTH: Oral mucosa moist.  No thrush. NECK: Supple.  Tracheostomy in place, clean. PULMONARY: Very distant breath sounds, coarse, no other adventitious sounds, poor air movement. CARDIOVASCULAR: S1 and S2. Regular rate and rhythm.  No rubs murmurs gallops heard. GASTROINTESTINAL: Soft, nontender, tolerating tube feeds. MUSCULOSKELETAL: No joint deformity, no clubbing, no edema.  NEUROLOGIC: Awake, alert, interacts.  Follows commands.  No overt focal deficit. SKIN: Intact,warm,dry. PSYCH: Calm today, interactive.  Pressure Injury 06/24/19 Buttocks Right Stage 3 -  Full thickness tissue loss. Subcutaneous fat may be visible but bone, tendon or muscle are NOT exposed. (Active)  06/24/19 0200  Location: Buttocks  Location Orientation: Right  Staging: Stage 3 -  Full thickness tissue loss. Subcutaneous fat may be visible but bone, tendon or muscle are NOT exposed.  Wound  Description (Comments):   Present on Admission: No        MEDICATIONS: I have reviewed all medications and confirmed regimen as documented   CULTURE RESULTS   No results found for this or any previous visit (from the past 240 hour(s)).      BMP Latest Ref Rng & Units 07/24/2019 07/23/2019 07/22/2019  Glucose 70 - 99 mg/dL 125(H) 133(H) 92  BUN 6 - 20 mg/dL 23(H) 22(H) 17  Creatinine 0.61 - 1.24 mg/dL 0.49(L) 0.38(L) 0.46(L)  Sodium 135 - 145 mmol/L 135 136 136  Potassium 3.5 - 5.1 mmol/L 4.1 4.1 4.1  Chloride 98 - 111 mmol/L 89(L) 90(L) 91(L)  CO2 22 - 32 mmol/L 39(H) 38(H) 37(H)  Calcium 8.9 - 10.3 mg/dL 9.0 9.0 8.9         Indwelling Urinary Catheter  removed  Reason to continue Indwelling Urinary Catheter         Ventilator continued, requirement due to severe respiratory failure   Ventilator Sedation RASS 0       ASSESSMENT AND PLAN   Acute on chronic hypercarbic/hypoxic respiratory failure COPD exacerbation s/ptracheostomy  Bronchodilators: DuoNeb every 4 and as needed Chronic vent support Failure to wean Continue pressure support and wean as tolerated  Very  severe COPD stage IV  Failure to wean continue LAMA/LABA/ICS Continue attempts at weaning Very poor baseline function   Acute encephalopathy Unresponsiveness Due to hypercarbia RESOLVED   Hypoglycemia Resolved Was due to poor nutritional status on admit Respiratory muscle function demand Continue tube feeds ICU hypoglycemia protocol   Acute kidney injury  Avoid nephrotoxins Stable with no worsening Trend renal panel  Hyperkalemia RESOLVED Likely triggered by acidosis Periodic check on BMP   ID Procalcitonin normal No infiltrate on chest x-ray No indication for antibiotics at present Continue to trend WBC/fever curve Had 1 single blood culture of MRSA likely contaminant [name] MRSA PCR negative Tracheitis associated with tracheostomy RESOLVED  Abdominal  distention Ileus CT abdomen and pelvis: No obstruction: No free air Findings likely due to ileus, constipation Bowel regimen RESOLVED  Prophylaxis: Lovenox Famotidine  Nutrition Status: Nutrition Problem: Severe Malnutrition Etiology: chronic illness(COPD) Signs/Symptoms: severe fat depletion, severe muscle depletion Interventions: Refer to RD note for recommendations Severe malnutrition on admission, improving with tube feeds    Care coordination performed with bedside nurse.  Discussed with RT.  C. Danice Goltz, MD  PCCM  *This note was dictated using voice recognition software/Dragon.  Despite best efforts to proofread, errors can occur which can change the meaning.  Any change was purely unintentional.

## 2019-07-24 NOTE — Progress Notes (Signed)
At bedside, patient with a little anxiety during vent round.  Requested more oxygen. Saturation good on 30%. Trach in place and suctioned for white thick secretions. Reassurance provided for calming measures. SVN given. Will continue to monitor.

## 2019-07-24 NOTE — Plan of Care (Signed)
Pt continued on full vent support all night, requesting frequent suction, sputum is thick, tan and copious. Pt has strong cough and gag reflex. Tube feeding at 81mls/hr, no residual noted, pt tolerating it. PRN pain medication given x1 c/o gen aches. Pt able to mouth clear words to communicate needs. Pt did not sleep well last night, mostly awake.

## 2019-07-24 NOTE — Progress Notes (Signed)
PHARMACY CONSULT NOTE  Pharmacy Consult for Electrolyte Monitoring and Replacement   Recent Labs: Potassium (mmol/L)  Date Value  07/24/2019 4.1  09/24/2013 4.6   Magnesium (mg/dL)  Date Value  08/25/7626 1.9   Calcium (mg/dL)  Date Value  31/51/7616 9.0   Calcium, Total (mg/dL)  Date Value  07/37/1062 8.9   Albumin (g/dL)  Date Value  69/48/5462 3.5   Phosphorus (mg/dL)  Date Value  70/35/0093 4.0   Sodium (mmol/L)  Date Value  07/24/2019 135  09/24/2013 138    Assessment: 49 year old male admitted after being found unresponsive and admitted with end stage COPD. Patient was intubated in the ED and transferred to the ICU. Patient with trach and G-tube.  Electrolytes: Goal: K ~4, Mag ~2.  All other electrolytes WNL  Patient currently on mag-ox 800 mg VT BID.   Will check electrolytes with AM labs.    Glucose: Following CBGs q4h. Patient restarted tube feeds 5/13.   Will continue to monitor.  Constipation: Last BM 5/20.  Current orders for scheduled Miralax and Senokot-S.   Pharmacy will continue to monitor and adjust per consult.   Pricilla Riffle, PharmD 07/24/2019 12:35 PM

## 2019-07-25 LAB — CBC WITH DIFFERENTIAL/PLATELET
Abs Immature Granulocytes: 0.28 10*3/uL — ABNORMAL HIGH (ref 0.00–0.07)
Basophils Absolute: 0.1 10*3/uL (ref 0.0–0.1)
Basophils Relative: 1 %
Eosinophils Absolute: 0.6 10*3/uL — ABNORMAL HIGH (ref 0.0–0.5)
Eosinophils Relative: 5 %
HCT: 32.2 % — ABNORMAL LOW (ref 39.0–52.0)
Hemoglobin: 10 g/dL — ABNORMAL LOW (ref 13.0–17.0)
Immature Granulocytes: 2 %
Lymphocytes Relative: 18 %
Lymphs Abs: 2.3 10*3/uL (ref 0.7–4.0)
MCH: 30.8 pg (ref 26.0–34.0)
MCHC: 31.1 g/dL (ref 30.0–36.0)
MCV: 99.1 fL (ref 80.0–100.0)
Monocytes Absolute: 1.2 10*3/uL — ABNORMAL HIGH (ref 0.1–1.0)
Monocytes Relative: 10 %
Neutro Abs: 8.1 10*3/uL — ABNORMAL HIGH (ref 1.7–7.7)
Neutrophils Relative %: 64 %
Platelets: 131 10*3/uL — ABNORMAL LOW (ref 150–400)
RBC: 3.25 MIL/uL — ABNORMAL LOW (ref 4.22–5.81)
RDW: 14 % (ref 11.5–15.5)
WBC: 12.6 10*3/uL — ABNORMAL HIGH (ref 4.0–10.5)
nRBC: 0 % (ref 0.0–0.2)

## 2019-07-25 LAB — BASIC METABOLIC PANEL
Anion gap: 8 (ref 5–15)
BUN: 22 mg/dL — ABNORMAL HIGH (ref 6–20)
CO2: 37 mmol/L — ABNORMAL HIGH (ref 22–32)
Calcium: 9.2 mg/dL (ref 8.9–10.3)
Chloride: 91 mmol/L — ABNORMAL LOW (ref 98–111)
Creatinine, Ser: 0.38 mg/dL — ABNORMAL LOW (ref 0.61–1.24)
GFR calc Af Amer: >60 mL/min (ref >60)
GFR calc non Af Amer: >60 mL/min (ref >60)
Glucose, Bld: 114 mg/dL — ABNORMAL HIGH (ref 70–99)
Potassium: 4.5 mmol/L (ref 3.5–5.1)
Sodium: 136 mmol/L (ref 135–145)

## 2019-07-25 LAB — MAGNESIUM: Magnesium: 2 mg/dL (ref 1.7–2.4)

## 2019-07-25 LAB — GLUCOSE, CAPILLARY
Glucose-Capillary: 112 mg/dL — ABNORMAL HIGH (ref 70–99)
Glucose-Capillary: 112 mg/dL — ABNORMAL HIGH (ref 70–99)
Glucose-Capillary: 118 mg/dL — ABNORMAL HIGH (ref 70–99)
Glucose-Capillary: 81 mg/dL (ref 70–99)
Glucose-Capillary: 92 mg/dL (ref 70–99)
Glucose-Capillary: 95 mg/dL (ref 70–99)

## 2019-07-25 LAB — PHOSPHORUS: Phosphorus: 5.3 mg/dL — ABNORMAL HIGH (ref 2.5–4.6)

## 2019-07-25 MED ORDER — SODIUM CHLORIDE 3 % IN NEBU
4.0000 mL | INHALATION_SOLUTION | Freq: Every day | RESPIRATORY_TRACT | Status: DC | PRN
  Administered 2019-07-25 (×2): 4 mL via RESPIRATORY_TRACT
  Filled 2019-07-25 (×3): qty 4

## 2019-07-25 NOTE — Progress Notes (Signed)
PHARMACY CONSULT NOTE  Pharmacy Consult for Electrolyte Monitoring and Replacement   Recent Labs: Potassium (mmol/L)  Date Value  07/25/2019 4.5  09/24/2013 4.6   Magnesium (mg/dL)  Date Value  41/59/7331 2.0   Calcium (mg/dL)  Date Value  25/10/7197 9.2   Calcium, Total (mg/dL)  Date Value  41/29/0475 8.9   Albumin (g/dL)  Date Value  33/91/7921 3.5   Phosphorus (mg/dL)  Date Value  78/37/5423 5.3 (H)   Sodium (mmol/L)  Date Value  07/25/2019 136  09/24/2013 138    Assessment: 49 year old male admitted after being found unresponsive and admitted with end stage COPD. Patient was intubated in the ED and transferred to the ICU. Patient with trach and G-tube.  Electrolytes: Goal: K ~4, Mag ~2.  All other electrolytes WNL  Patient currently on mag-ox 800 mg VT BID.   Continue to monitor.    Glucose: Following CBGs q4h. Patient restarted tube feeds 5/13.   Will continue to monitor.  Constipation: Last BM 5/23.  Current orders for scheduled Miralax and Senokot-S.   Pharmacy will continue to monitor and adjust per consult.   Pricilla Riffle, PharmD 07/25/2019 8:58 AM

## 2019-07-25 NOTE — Progress Notes (Signed)
CRITICAL CARE NOTE  BACKGROUND: 49 year old former smoker with a history very severe COPD stage IV, who presented via EMS after being found down and unresponsive.  Apparently the patient's mother placed a call to EMS.  The patient has a longstanding history of COPD and noncompliance with his oxygen and CPAP.  History could not be obtained from the patient as there was no family available and the patient had been intubated and was mechanically ventilated in the ED.  Of note he had recently been admitted to [facility] 29 May 2019 through 03 June 2019.  He had been evaluated by palliative care during this admission and actually had a DNR order.   Patient also had a 2D echo  with pulmonary hypertension consistent with cor pulmonale.  Admit records review: Patient has been following with Dr. Ned Clines at [facility].  Last known PFTs 11 September 2017.  FEV1 0.52 L or 20% predicted, FVC 1.49 L or 45% of predicted, FEV1/FVC 35%.  FEF 25-75 6%.  DLCO was 33%.  This is consistent with very severe COPD.  Events: 4/13 admitted for severe resp failure from COPD 4/14 remains critically ill on vent 4/15 remains critically ill, severe COPD, failed SAT/SBT 4/16 extubated yesterday, had to be reintubated remains on ventilator 4/17 sedated on the ventilator 4/18 abdominal distention, CT abdomen and pelvis consistent with ileus and constipation 4/19-patietn remains on ventialtor awainting trach 4/20 - patient is on vent with trach, he had episodes of aggitation today but eventually improved  4/21 - patient with significant distension of abdomen, concern for aerophagia in NIV.  There is mucopurulent exudation from trache this was cultures and patient placed on Unasyn for tracheitis 06/24/19 -trache site improved with less malodorous pus draining, NGT with decompression worked well abdomen is less distended. S/p PEG 06/25/19- patient had some anxiety throughout day today, we have started low dose anxiolytic. His PEG  is now being utilized. He has some placement issues with social work due to insurance and we are working on this now.  06/26/19-Patient had no events today, we are waiting for placement now from transitional care team 06/27/19- Patient is calm this morining, abdomen is mildy distended, awaiting placement to [facility] 4/26 plan for PS/TCT 4/27 Pt failed trach collar twice today. Became visibly distressed, diaphoretic, o2 drops, and accessory muscle use.  4/28 increased WOB, resp distress, using accessory muscles to breath while on vent 4/29 patient placed on PS mode, given BD therapy, alert and awake, was able to communicate with PM valve 07/02/19 -trache changed to cuffed size 6 without incident, patient reports more comfort with improved vocalization.  07/03/19- no overnight events, patient sitting up in bed in no distress.  5/3severe hypoxia and increased WOB, placed back on full vent support 5/4 severe resp distress, can NOT tolerate PS mode, severe resp failure End stage COPD 5/5 severe resp distress, can not tolerate TCT, severe end stage COPD 5/7sitting up in chair, able to do some pressure support trials but no trach collar trials. Still appears very tachypneic 5/8 cannot tolerate trach collar, few hours of PS only 5/9remains vent dependent. Minimal progress with weaning 5/10 increased WOB near cardiac arrest, distended abd 5/12on PS mode 5/14 back on PC mode, very high levels of anxiety 5/16 patient is agreeable to home with hospice after further discourse regarding his recurrent respiratory distress while hospitalized which prevents [place] from discharge.  5/18- discussed DC plan with CM and SW today. Palliative care on case 07/21/19 - patient had episode  of respiratory distress again when off ventilator after few hours. This will be difficult for placement and he will likely be brought back to hospital very quickly.  5/20- patient was transitioned to HFNC today he did well with it for few hours  and then again had to be placed on MV.  He is still waiting on placment.  5/21-severe anxiety, anxiolytics adjusted 5/23-ambulated around unit with RT and nursing assistance!!   Problems: Severe COPD Acute on chronic resp failure Tracheostomy and ventilator dependent  BP 111/80   Pulse 87   Temp 98.6 F (37 C) (Oral)   Resp 16   Ht 5' 5.98" (1.676 m)   Wt 55.2 kg   SpO2 99%   BMI 19.65 kg/m    I/O last 3 completed shifts: In: 9255 [I.V.:75; NG/GT:9180] Out: 1400 [Urine:1400] Total I/O In: 675 [NG/GT:675] Out: 400 [Urine:400]  SpO2: 99 % O2 Flow Rate (L/min): 55 L/min FiO2 (%): 30 %  Estimated body mass index is 19.65 kg/m as calculated from the following:   Height as of this encounter: 5' 5.98" (1.676 m).   Weight as of this encounter: 55.2 kg.  REVIEW OF SYSTEMS  10 point ROS done and is neg except as per subjective findings, difficult to assess due to trach status.  PHYSICAL EXAMINATION: GENERAL: Thin male, on vent, using accessories even with vent support. HEAD: Normocephalic, atraumatic.  EYES: Pupils equal, round, reactive to light.  No scleral icterus.  MOUTH: Oral mucosa moist.  No thrush. NECK: Supple.  Tracheostomy in place, clean. PULMONARY: Very distant breath sounds, coarse, no other adventitious sounds, poor air movement. CARDIOVASCULAR: S1 and S2. Regular rate and rhythm.  No rubs murmurs gallops heard. GASTROINTESTINAL: Soft, nontender, tolerating tube feeds.  PEG tube in place, no erythema on entry point. MUSCULOSKELETAL: No joint deformity, no clubbing, no edema.  NEUROLOGIC: Awake, alert, interacts.  Follows commands.  No overt focal deficit. SKIN: Intact,warm,dry. PSYCH: Calm today, interactive.  Pressure Injury 06/24/19 Buttocks Right Stage 3 -  Full thickness tissue loss. Subcutaneous fat may be visible but bone, tendon or muscle are NOT exposed. (Active)  06/24/19 0200  Location: Buttocks  Location Orientation: Right  Staging: Stage 3  -  Full thickness tissue loss. Subcutaneous fat may be visible but bone, tendon or muscle are NOT exposed.  Wound Description (Comments):   Present on Admission: No     MEDICATIONS: I have reviewed all medications and confirmed regimen as documented   CULTURE RESULTS   No results found for this or any previous visit (from the past 240 hour(s)).      BMP Latest Ref Rng & Units 07/25/2019 07/24/2019 07/23/2019  Glucose 70 - 99 mg/dL 017(B) 939(Q) 300(P)  BUN 6 - 20 mg/dL 23(R) 00(T) 62(U)  Creatinine 0.61 - 1.24 mg/dL 6.33(H) 5.45(G) 2.56(L)  Sodium 135 - 145 mmol/L 136 135 136  Potassium 3.5 - 5.1 mmol/L 4.5 4.1 4.1  Chloride 98 - 111 mmol/L 91(L) 89(L) 90(L)  CO2 22 - 32 mmol/L 37(H) 39(H) 38(H)  Calcium 8.9 - 10.3 mg/dL 9.2 9.0 9.0     Indwelling Urinary Catheter  removed  Reason to continue Indwelling Urinary Catheter         Ventilator continued, requirement due to severe respiratory failure   Ventilator Sedation RASS 0       ASSESSMENT AND PLAN   Acute on chronic hypercarbic/hypoxic respiratory failure COPD exacerbation s/ptracheostomy  Bronchodilators: DuoNeb every 4 and as needed Chronic vent support Failure to wean  Continue pressure support and wean as tolerated Was able to ambulate today with RT bagging patient  Very severe COPD stage IV  Failure to wean continue LAMA/LABA/ICS Continue attempts at weaning Very poor baseline function Encouraging that was able to ambulate today with assistance   Acute encephalopathy Unresponsiveness Due to hypercarbia RESOLVED   Hypoglycemia Resolved Was due to poor nutritional status on admit Respiratory muscle function demand Continue tube feeds ICU hypoglycemia protocol   Acute kidney injury  Avoid nephrotoxins Stable with no worsening Trend renal panel  Hyperkalemia RESOLVED Likely triggered by acidosis Periodic check on BMP   ID Procalcitonin normal No infiltrate on chest x-ray No  indication for antibiotics at present Continue to trend WBC/fever curve Had 1 single blood culture of MRSA likely contaminant [name] MRSA PCR negative Tracheitis associated with tracheostomy RESOLVED  Abdominal distention Ileus CT abdomen and pelvis: No obstruction: No free air Findings likely due to ileus, constipation Bowel regimen RESOLVED  Prophylaxis: Lovenox Famotidine  Nutrition Status: Nutrition Problem: Severe Malnutrition Etiology: chronic illness(COPD) Signs/Symptoms: severe fat depletion, severe muscle depletion Interventions: Refer to RD note for recommendations Severe malnutrition on admission, improving with tube feeds    Care coordination performed with bedside nurse.  Discussed with RT.  C. Derrill Kay, MD [place] PCCM  *This note was dictated using voice recognition software/Dragon.  Despite best efforts to proofread, errors can occur which can change the meaning.  Any change was purely unintentional.

## 2019-07-26 LAB — GLUCOSE, CAPILLARY
Glucose-Capillary: 101 mg/dL — ABNORMAL HIGH (ref 70–99)
Glucose-Capillary: 102 mg/dL — ABNORMAL HIGH (ref 70–99)
Glucose-Capillary: 111 mg/dL — ABNORMAL HIGH (ref 70–99)
Glucose-Capillary: 118 mg/dL — ABNORMAL HIGH (ref 70–99)
Glucose-Capillary: 126 mg/dL — ABNORMAL HIGH (ref 70–99)
Glucose-Capillary: 132 mg/dL — ABNORMAL HIGH (ref 70–99)

## 2019-07-26 LAB — CBC WITH DIFFERENTIAL/PLATELET
Abs Immature Granulocytes: 0.14 10*3/uL — ABNORMAL HIGH (ref 0.00–0.07)
Basophils Absolute: 0 10*3/uL (ref 0.0–0.1)
Basophils Relative: 0 %
Eosinophils Absolute: 0.3 10*3/uL (ref 0.0–0.5)
Eosinophils Relative: 3 %
HCT: 28.6 % — ABNORMAL LOW (ref 39.0–52.0)
Hemoglobin: 8.9 g/dL — ABNORMAL LOW (ref 13.0–17.0)
Immature Granulocytes: 1 %
Lymphocytes Relative: 11 %
Lymphs Abs: 1.1 10*3/uL (ref 0.7–4.0)
MCH: 30.7 pg (ref 26.0–34.0)
MCHC: 31.1 g/dL (ref 30.0–36.0)
MCV: 98.6 fL (ref 80.0–100.0)
Monocytes Absolute: 1.1 10*3/uL — ABNORMAL HIGH (ref 0.1–1.0)
Monocytes Relative: 10 %
Neutro Abs: 7.4 10*3/uL (ref 1.7–7.7)
Neutrophils Relative %: 75 %
Platelets: 115 10*3/uL — ABNORMAL LOW (ref 150–400)
RBC: 2.9 MIL/uL — ABNORMAL LOW (ref 4.22–5.81)
RDW: 14.1 % (ref 11.5–15.5)
WBC: 10.1 10*3/uL (ref 4.0–10.5)
nRBC: 0 % (ref 0.0–0.2)

## 2019-07-26 LAB — BASIC METABOLIC PANEL
Anion gap: 7 (ref 5–15)
BUN: 15 mg/dL (ref 6–20)
CO2: 37 mmol/L — ABNORMAL HIGH (ref 22–32)
Calcium: 8.9 mg/dL (ref 8.9–10.3)
Chloride: 92 mmol/L — ABNORMAL LOW (ref 98–111)
Creatinine, Ser: 0.48 mg/dL — ABNORMAL LOW (ref 0.61–1.24)
GFR calc Af Amer: >60 mL/min (ref >60)
GFR calc non Af Amer: >60 mL/min (ref >60)
Glucose, Bld: 94 mg/dL (ref 70–99)
Potassium: 4.1 mmol/L (ref 3.5–5.1)
Sodium: 136 mmol/L (ref 135–145)

## 2019-07-26 LAB — PHOSPHORUS: Phosphorus: 4.9 mg/dL — ABNORMAL HIGH (ref 2.5–4.6)

## 2019-07-26 LAB — MAGNESIUM: Magnesium: 1.9 mg/dL (ref 1.7–2.4)

## 2019-07-26 NOTE — Consult Note (Signed)
WOC Nurse follow-up consult Note: Wound type: Stage 3 pressure injury to right buttock slowly healing and decreasing in size; 1.2X.8X.1cm pink and moist, small amt yellow drainage, no odor or fluctuance Pressure Injury POA: No Dressing procedure/placement/frequency: Pt is critically ill with multiple systemic factors which can impair healing.  He is on an air mattress to reduce pressure.  Topical treatment orders provided for bedside nurses to perform daily as follows to protect and promote healing: Foam dressing to buttock, change Q 3 days or PRN soiling. WOC team will reassess location weekly to determine if a change in the plan of care is indicated at that time.  Cammie Mcgee MSN, RN, CWOCN, [place], CNS [phone]

## 2019-07-26 NOTE — Plan of Care (Signed)
Patient declined OOB this shift, he did experience two episodes SOB/ inc WOB, anxiety, was able to maintain him with scheduled PEG admin meds, no IV PRNs administered.  Phys Therapy signed off for now.  PICC removed per Dr Belia Heman and PIV started.  TF tolerated well.  Pt a&o x 4 and able to whisper and make his needs known.

## 2019-07-26 NOTE — Progress Notes (Signed)
PHARMACY CONSULT NOTE  Pharmacy Consult for Electrolyte Monitoring and Replacement   Recent Labs: Potassium (mmol/L)  Date Value  07/26/2019 4.1  09/24/2013 4.6   Magnesium (mg/dL)  Date Value  35/78/9784 1.9   Calcium (mg/dL)  Date Value  78/41/2820 8.9   Calcium, Total (mg/dL)  Date Value  81/38/8719 8.9   Albumin (g/dL)  Date Value  59/74/7185 3.5   Phosphorus (mg/dL)  Date Value  50/15/8682 4.9 (H)   Sodium (mmol/L)  Date Value  07/26/2019 136  09/24/2013 138    Assessment: 49 year old male admitted after being found unresponsive and admitted with end stage COPD. Patient was intubated in the ED and transferred to the ICU. Patient with trach and G-tube.  Electrolytes: Goal: K ~4, Mag ~2.  All other electrolytes WNL  Patient currently on mag-ox 800 mg VT BID.   Patient has been stable: Continue to monitor labs but less frequently than once daily: next 5/26 am    Glucose: Following CBGs q4h. Patient restarted tube feeds 5/13.   Will continue to monitor.  Constipation: Last BM 5/23.  Current orders for scheduled Miralax and Senokot-S.   Pharmacy will continue to monitor and adjust per consult.   Chris Case, PharmD 07/26/2019 7:20 AM

## 2019-07-26 NOTE — Progress Notes (Signed)
CRITICAL CARE NOTE  BACKGROUND: 49 year old former smoker with a history very severe COPD stage IV, who presented via EMS after being found down and unresponsive.  Apparently the patient's mother placed a call to EMS.  The patient has a longstanding history of COPD and noncompliance with his oxygen and CPAP.  History could not be obtained from the patient as there was no family available and the patient had been intubated and was mechanically ventilated in the ED.  Of note he had recently been admitted to [facility] 29 May 2019 through 03 June 2019.  He had been evaluated by palliative care during this admission and actually had a DNR order.   Patient also had a 2D echo  with pulmonary hypertension consistent with cor pulmonale.  Admit records review: Patient has been following with Dr. Ned Clines at [facility].  Last known PFTs 11 September 2017.  FEV1 0.52 L or 20% predicted, FVC 1.49 L or 45% of predicted, FEV1/FVC 35%.  FEF 25-75 6%.  DLCO was 33%.  This is consistent with very severe COPD.  Events: 4/13 admitted for severe resp failure from COPD 4/14 remains critically ill on vent 4/15 remains critically ill, severe COPD, failed SAT/SBT 4/16 extubated yesterday, had to be reintubated remains on ventilator 4/17 sedated on the ventilator 4/18 abdominal distention, CT abdomen and pelvis consistent with ileus and constipation 4/19-patietn remains on ventialtor awainting trach 4/20 - patient is on vent with trach, he had episodes of aggitation today but eventually improved  4/21 - patient with significant distension of abdomen, concern for aerophagia in NIV.  There is mucopurulent exudation from trache this was cultures and patient placed on Unasyn for tracheitis 06/24/19 -trache site improved with less malodorous pus draining, NGT with decompression worked well abdomen is less distended. S/p PEG 06/25/19- patient had some anxiety throughout day today, we have started low dose anxiolytic. His PEG  is now being utilized. He has some placement issues with social work due to insurance and we are working on this now.  06/26/19-Patient had no events today, we are waiting for placement now from transitional care team 06/27/19- Patient is calm this morining, abdomen is mildy distended, awaiting placement to [facility] 4/26 plan for PS/TCT 4/27 Pt failed trach collar twice today. Became visibly distressed, diaphoretic, o2 drops, and accessory muscle use.  4/28 increased WOB, resp distress, using accessory muscles to breath while on vent 4/29 patient placed on PS mode, given BD therapy, alert and awake, was able to communicate with PM valve 07/02/19 -trache changed to cuffed size 6 without incident, patient reports more comfort with improved vocalization.  07/03/19- no overnight events, patient sitting up in bed in no distress.  5/3severe hypoxia and increased WOB, placed back on full vent support 5/4 severe resp distress, can NOT tolerate PS mode, severe resp failure End stage COPD 5/5 severe resp distress, can not tolerate TCT, severe end stage COPD 5/7sitting up in chair, able to do some pressure support trials but no trach collar trials. Still appears very tachypneic 5/8 cannot tolerate trach collar, few hours of PS only 5/9remains vent dependent. Minimal progress with weaning 5/10 increased WOB near cardiac arrest, distended abd 5/12on PS mode 5/14 back on PC mode, very high levels of anxiety 5/16 patient is agreeable to home with hospice after further discourse regarding his recurrent respiratory distress while hospitalized which prevents [place] from discharge.  5/18- discussed DC plan with CM and SW today. Palliative care on case 07/21/19 - patient had episode  of respiratory distress again when off ventilator after few hours. This will be difficult for placement and he will likely be brought back to hospital very quickly.  5/20- patient was transitioned to HFNC today he did well with it for few hours  and then again had to be placed on MV.  He is still waiting on placment.  5/21-severe anxiety, anxiolytics adjusted 5/23-ambulated around unit with RT and nursing assistance!! 5/24 increased WOB, back on full VENT support, given oral sedatives  CC Follow up SEVERE COPD  HPI Increased WOB Back on full vent support TRACH VENT DEPENDANT END STAGE COPD  BP 97/65   Pulse 84   Temp 98.2 F (36.8 C) (Axillary)   Resp 15   Ht 5' 5.98" (1.676 m)   Wt 56.8 kg   SpO2 96%   BMI 20.22 kg/m    I/O last 3 completed shifts: In: 2748.8 [I.V.:30; NG/GT:2718.8] Out: 1900 [Urine:1900] Total I/O In: 455 [Other:230; NG/GT:225] Out: 200 [Urine:200]  SpO2: 96 % O2 Flow Rate (L/min): 55 L/min FiO2 (%): 30 %  Estimated body mass index is 20.22 kg/m as calculated from the following:   Height as of this encounter: 5' 5.98" (1.676 m).   Weight as of this encounter: 56.8 kg.  REVIEW OF SYSTEMS  PATIENT IS UNABLE TO PROVIDE COMPLETE REVIEW OF SYSTEMS DUE TO sedatives  PHYSICAL EXAMINATION:  GENERAL:critically ill appearing, +resp distress HEAD: Normocephalic, atraumatic.  EYES: Pupils equal, round, reactive to light.  No scleral icterus.  MOUTH: Moist mucosal membrane. NECK: Supple. No thyromegaly. No nodules. No JVD.  PULMONARY: +rhonchi, +wheezing CARDIOVASCULAR: S1 and S2. Regular rate and rhythm. No murmurs, rubs, or gallops.  GASTROINTESTINAL: Soft, nontender, -distended. Positive bowel sounds.  MUSCULOSKELETAL: No swelling, clubbing, or edema.  NEUROLOGIC: lethargic SKIN:intact,warm,dry    Pressure Injury 06/24/19 Buttocks Right Stage 3 -  Full thickness tissue loss. Subcutaneous fat may be visible but bone, tendon or muscle are NOT exposed. (Active)  06/24/19 0200  Location: Buttocks  Location Orientation: Right  Staging: Stage 3 -  Full thickness tissue loss. Subcutaneous fat may be visible but bone, tendon or muscle are NOT exposed.  Wound Description (Comments):    Present on Admission: No     MEDICATIONS: I have reviewed all medications and confirmed regimen as documented   CULTURE RESULTS   No results found for this or any previous visit (from the past 240 hour(s)).      BMP Latest Ref Rng & Units 07/26/2019 07/25/2019 07/24/2019  Glucose 70 - 99 mg/dL 94 081(K) 481(E)  BUN 6 - 20 mg/dL 15 56(D) 14(H)  Creatinine 0.61 - 1.24 mg/dL 7.02(O) 3.78(H) 8.85(O)  Sodium 135 - 145 mmol/L 136 136 135  Potassium 3.5 - 5.1 mmol/L 4.1 4.5 4.1  Chloride 98 - 111 mmol/L 92(L) 91(L) 89(L)  CO2 22 - 32 mmol/L 37(H) 37(H) 39(H)  Calcium 8.9 - 10.3 mg/dL 8.9 9.2 9.0      Indwelling Urinary Catheter continued, requirement due to   Reason to continue Indwelling Urinary Catheter strict Intake/Output monitoring for hemodynamic instability   Central Line/ continued, requirement due to  Reason to continue [employer] of central venous pressure or other hemodynamic parameters and poor IV access   Ventilator continued, requirement due to severe respiratory failure     ASSESSMENT AND PLAN  SEVERE COPD EXACERBATION, END STAGE COPD FAILURE TO WEAN FROM VENT VENT DEPENDANT-needs this indefinitely Needs TRACH indefinitely -continue NEB THERAPY as prescribed Wean to PS mode as  tolerated   Very severe COPD stage IV  Failure to wean Continue INHALERS   Hypoglycemia Resolved   ID No infection at this time  Abdominal distention Ileus-resolved  Prophylaxis: Lovenox Famotidine  Nutrition Status: Nutrition Problem: Severe Malnutrition Etiology: chronic illness(COPD) Signs/Symptoms: severe fat depletion, severe muscle depletion Interventions: Refer to RD note for recommendations Severe malnutrition on admission, improving with tube feeds    Corrin Parker, M.D.  [facility]  Medical Director [place] Director [facility] Cardio-Pulmonary Department

## 2019-07-26 NOTE — Progress Notes (Signed)
Physical Therapy Discharge Patient Details Name: Chris Case MRN: [id number] DOB: [birth year] Today's Date: 07/26/2019    Patient discharged from PT services secondary to pt able to perform all appropriate mobility with nursing team for last couple weeks. Pt has been consistent with OOB to chair almost daily. Pt has had fluctuating tolerance to basic mobility, sometimes unable to tolerate mobility required for ADL performance in addition to additional mobility for PT treatment sessions. Pt was able to progress to some asissted AMB for the first time since this admission with nursing team over the weekend. Pt has met all therapy goals at this time and should continue to perform daily ADL mobility with nursing ad lib and as tolerated to prevent functional decline.   Please see latest therapy progress note for current level of functioning and progress toward goals.    Progress and discharge plan discussed with Dr. Mortimer Fries and Izora Gala, RN. If patient sustains any acute decline in mobility, please place a new consult for our services.    2:25 PM, 07/26/19 Etta Grandchild, PT, DPT Physical Therapist - [facility]  [phone] [facility])

## 2019-07-27 DIAGNOSIS — J441 Chronic obstructive pulmonary disease with (acute) exacerbation: Secondary | ICD-10-CM

## 2019-07-27 LAB — GLUCOSE, CAPILLARY
Glucose-Capillary: 102 mg/dL — ABNORMAL HIGH (ref 70–99)
Glucose-Capillary: 120 mg/dL — ABNORMAL HIGH (ref 70–99)
Glucose-Capillary: 134 mg/dL — ABNORMAL HIGH (ref 70–99)
Glucose-Capillary: 83 mg/dL (ref 70–99)
Glucose-Capillary: 84 mg/dL (ref 70–99)
Glucose-Capillary: 85 mg/dL (ref 70–99)
Glucose-Capillary: 97 mg/dL (ref 70–99)

## 2019-07-27 MED ORDER — MORPHINE SULFATE (PF) 2 MG/ML IV SOLN: 2.0000 mg | Freq: Once | INTRAVENOUS | Status: AC

## 2019-07-27 MED ORDER — SODIUM CHLORIDE 0.9% FLUSH
3.0000 mL | Freq: Two times a day (BID) | INTRAVENOUS | Status: DC
  Administered 2019-07-27 – 2019-07-29 (×3): 3 mL via INTRAVENOUS

## 2019-07-27 NOTE — Progress Notes (Signed)
CRITICAL CARE NOTE  49 year old former smoker with a history very severe COPD stage IV, who presented via EMS after being found down and unresponsive. Apparently the patient's mother placed a call to EMS. The patient has a longstanding history of COPD and noncompliance with his oxygen and CPAP. History could not be obtained from the patient as there was no family available and the patient had been intubated and was mechanically ventilated in the ED. Of note he had recently been admitted to [facility] 29 May 2019 through 03 June 2019. He had been evaluated by palliative care during this admission and actually had a DNR order.  Patient also had a 2D echo  with pulmonary hypertension consistent with cor pulmonale.  Admit records review: Patient has been following with Dr. Ned Clines at [facility].  Last known PFTs 11 September 2017.  FEV1 0.52 L or 20% predicted, FVC 1.49 L or 45% of predicted, FEV1/FVC 35%.  FEF 25-75 6%.  DLCO was 33%.  This is consistent with very severe COPD.  Events: 4/13 admitted for severe resp failure from COPD 4/14 remains critically ill on vent 4/15 remains critically ill, severe COPD, failed SAT/SBT 4/16 extubated yesterday, had to be reintubated remains on ventilator 4/17 sedated on the ventilator 4/18 abdominal distention, CT abdomen and pelvis consistent with ileus and constipation 4/19-patietn remains on ventialtor awainting trach 4/20 - patient is on vent with trach, he had episodes of aggitation today but eventually improved  4/21 - patient with significant distension of abdomen, concern for aerophagia in NIV.  There is mucopurulent exudation from trache this was cultures and patient placed on Unasyn for tracheitis 06/24/19 -trache site improved with less malodorous pus draining, NGT with decompression worked well abdomen is less distended. S/p PEG 06/25/19- patient had some anxiety throughout day today, we have started low dose anxiolytic. His PEG is now being  utilized. He has some placement issues with social work due to insurance and we are working on this now.  06/26/19-Patient had no events today, we are waiting for placement now from transitional care team 06/27/19- Patient is calm this morining, abdomen is mildy distended, awaiting placement to [facility] 4/26 plan for PS/TCT 4/27 Pt failed trach collar twice today. Became visibly distressed, diaphoretic, o2 drops, and accessory muscle use.  4/28increased WOB, resp distress, using accessory muscles to breath while on vent 4/29 patient placed on PS mode, given BD therapy, alert and awake, was able to communicate with PM valve 07/02/19 -trache changed to cuffed size 6 without incident, patient reports more comfort with improved vocalization.  07/03/19- no overnight events, patient sitting up in bed in no distress.  5/3severe hypoxia and increased WOB, placed back on full vent support 5/4 severe resp distress, can NOT tolerate PS mode, severe resp failure End stage COPD 5/5 severe resp distress, can not tolerate TCT, severe end stage COPD 5/7sitting up in chair, able to do some pressure support trials but no trach collar trials. Still appears very tachypneic 5/8 cannot tolerate trach collar, few hours of PS only 5/9remains vent dependent. Minimal progress with weaning 5/10 increased WOB near cardiac arrest, distended abd 5/12on PS mode 5/14 back on PC mode, very high levels of anxiety 5/16 patient is agreeable to home with hospice after further discourse regarding his recurrent respiratory distress while hospitalized which prevents [place] from discharge.  5/18- discussed DC plan with CM and SW today. Palliative care on case 07/21/19 - patient had episode of respiratory distress again when off ventilator after  few hours. This will be difficult for placement and he will likely be brought back to hospital very quickly.  5/20- patient was transitioned to HFNC today he did well with it for few hours and then  again had to be placed on MV.  He is still waiting on placment.  5/21-severe anxiety, anxiolytics adjusted 5/23-ambulated around unit with RT and nursing assistance!! 5/24 increased WOB, back on full VENT support, given oral sedatives 5/25 increased WOB, end stage COPD, did NOT tolerate PS mode  CC  follow up respiratory failure  SUBJECTIVE Patient remains critically ill Prognosis is guarded On VENT, end stage COPD   BP 98/68   Pulse 83   Temp 98.3 F (36.8 C) (Oral)   Resp 15   Ht 5' 5.98" (1.676 m)   Wt 55.2 kg   SpO2 100%   BMI 19.65 kg/m    I/O last 3 completed shifts: In: 3080 [I.V.:30; Other:500; NG/GT:2550] Out: 2100 [Urine:2100] Total I/O In: -  Out: 700 [Urine:700]  SpO2: 100 % O2 Flow Rate (L/min): 55 L/min FiO2 (%): 30 %  Estimated body mass index is 19.65 kg/m as calculated from the following:   Height as of this encounter: 5' 5.98" (1.676 m).   Weight as of this encounter: 55.2 kg.   Review of Systems: LIMITED DUE TO WOB AND SOB  Other:  All other systems negative   Pressure Injury 06/24/19 Buttocks Right Stage 3 -  Full thickness tissue loss. Subcutaneous fat may be visible but bone, tendon or muscle are NOT exposed. (Active)  06/24/19 0200  Location: Buttocks  Location Orientation: Right  Staging: Stage 3 -  Full thickness tissue loss. Subcutaneous fat may be visible but bone, tendon or muscle are NOT exposed.  Wound Description (Comments):   Present on Admission: No      PHYSICAL EXAMINATION:  GENERAL:critically ill appearing, +resp distress HEAD: Normocephalic, atraumatic.  EYES: Pupils equal, round, reactive to light.  No scleral icterus.  MOUTH: Moist mucosal membrane. NECK: Supple.  PULMONARY: +rhonchi, +wheezing CARDIOVASCULAR: S1 and S2. Regular rate and rhythm. No murmurs, rubs, or gallops.  GASTROINTESTINAL: Soft, nontender, -distended.  Positive bowel sounds.   MUSCULOSKELETAL: No swelling, clubbing, or edema.   NEUROLOGIC: lethargic SKIN:intact,warm,dry  MEDICATIONS: I have reviewed all medications and confirmed regimen as documented   CULTURE RESULTS   No results found for this or any previous visit (from the past 240 hour(s)).        IMAGING    No results found.   Nutrition Status: Nutrition Problem: Severe Malnutrition Etiology: chronic illness(COPD) Signs/Symptoms: severe fat depletion, severe muscle depletion Interventions: Refer to RD note for recommendations        Ventilator continued, requirement due to severe respiratory failure      ASSESSMENT AND PLAN SYNOPSIS  SEVERE COPD EXACERBATION, END STAGE COPD FAILURE TO WEAN FROM VENT VENT DEPENDANT-needs this indefinitely Needs TRACH indefinitely -continue NEB THERAPY as prescribed Wean to PS mode as tolerated Very poor prognosis Awaiting placement  ACUTE DIASTOLIC CARDIAC FAILURE- PULM HTN May consider sidenifil -oxygen as needed   CARDIAC ICU monitoring    GI GI PROPHYLAXIS as indicated  NUTRITIONAL STATUS Nutrition Status: Nutrition Problem: Severe Malnutrition Etiology: chronic illness(COPD) Signs/Symptoms: severe fat depletion, severe muscle depletion Interventions: Refer to RD note for recommendations   DIET-->TF's as tolerated Constipation protocol as indicated  ENDO - will use ICU hypoglycemic\Hyperglycemia protocol if indicated     ELECTROLYTES -follow labs as needed -replace as needed -pharmacy consultation and  following   DVT/GI PRX ordered and assessed TRANSFUSIONS AS NEEDED MONITOR FSBS I Assessed the need for Labs I Assessed the need for Foley I Assessed the need for Central Venous Line Family Discussion when available I Assessed the need for Mobilization I made an Assessment of medications to be adjusted accordingly Safety Risk assessment completed   CASE DISCUSSED IN MULTIDISCIPLINARY ROUNDS WITH ICU TEAM  Critical Care Time devoted to patient care services  described in this note is 32 minutes.   Overall, patient is critically ill, prognosis is guarded.      Lucie Leather, M.D.  [facility]  Medical Director [facility] Medical Director [facility] Cardio-Pulmonary Department

## 2019-07-27 NOTE — Progress Notes (Signed)
PHARMACY CONSULT NOTE  Pharmacy Consult for Electrolyte Monitoring and Replacement   Recent Labs: Potassium (mmol/L)  Date Value  07/26/2019 4.1  09/24/2013 4.6   Magnesium (mg/dL)  Date Value  05/03/4994 1.9   Calcium (mg/dL)  Date Value  92/49/3241 8.9   Calcium, Total (mg/dL)  Date Value  99/14/4458 8.9   Albumin (g/dL)  Date Value  48/35/0757 3.5   Phosphorus (mg/dL)  Date Value  32/25/6720 4.9 (H)   Sodium (mmol/L)  Date Value  07/26/2019 136  09/24/2013 138    Assessment: 49 year old male admitted after being found unresponsive and admitted with end stage COPD. Patient was intubated in the ED and transferred to the ICU. Patient with trach and G-tube.  Electrolytes: Goal: K ~4, Mag ~2.  All other electrolytes WNL  Patient currently on mag-ox 800 mg VT BID.   Patient has been stable: Continue to monitor labs but less frequently than once daily: next 5/26 am    Glucose: Following CBGs q4h. Patient restarted tube feeds 5/13.   Will continue to monitor.  Constipation: Last BM 5/23.  Current orders for scheduled Miralax and Senokot-S.   Pharmacy will continue to monitor and adjust per consult.   Lowella Bandy, PharmD 07/27/2019 7:13 AM

## 2019-07-27 NOTE — Progress Notes (Addendum)
Daily Progress Note   Patient Name: Chris Case       Date: 07/27/2019 DOB: 1970/05/06  Age: 49 y.o. MRN#: [id number] Attending Physician: Erin Fulling, MD Primary Care Physician: [facility] Admit Date: 06/15/2019  Reason for Consultation/Follow-up: Establishing goals of care  Subjective: Patient resting in bed on ventilator. He states he is waiting for facility placement. He states he is not happy with living on the ventilator, but he is alive, and would like to continue life prolonging care for the foreseeable future. He remains in agreement with DNR status. Goals are set. Recommend outpatient palliative to follow.  Please page if further needs arise.   Length of Stay: 42  Current Medications: Scheduled Meds:  . budesonide (PULMICORT) nebulizer solution  0.5 mg Nebulization BID  . chlorhexidine  15 mL Mouth Rinse BID  . Chlorhexidine Gluconate Cloth  6 each Topical Daily  . enoxaparin (LOVENOX) injection  40 mg Subcutaneous Q24H  . escitalopram  10 mg Per Tube Daily  . famotidine  20 mg Per Tube BID  . ipratropium-albuterol  3 mL Nebulization Q4H  . LORazepam  0.5 mg Oral TID  . magnesium oxide  800 mg Per Tube BID  . mouth rinse  15 mL Mouth Rinse q12n4p  . melatonin  10 mg Per Tube QHS  . nutrition supplement (JUVEN)  1 packet Per Tube BID BM  . polyethylene glycol  17 g Per Tube Daily  . senna-docusate  2 tablet Per Tube BID  . sodium chloride flush  10-40 mL Intracatheter Q12H    Continuous Infusions: . sodium chloride Stopped (06/25/19 1712)  . feeding supplement (OSMOLITE 1.5 CAL) 75 mL/hr at 07/27/19 0600    PRN Meds: sodium chloride, acetaminophen, acetaminophen, guaiFENesin, ipratropium-albuterol, LORazepam, morphine injection,  oxyCODONE-acetaminophen, sodium chloride flush  Physical Exam Pulmonary:     Comments: Trach in place and ventilator.  Neurological:     Mental Status: He is alert.             Vital Signs: BP 99/75   Pulse 98   Temp 98.2 F (36.8 C) (Oral)   Resp 15   Ht 5' 5.98" (1.676 m)   Wt 55.2 kg   SpO2 97%   BMI 19.65 kg/m  SpO2: SpO2: 97 % O2 Device: O2 Device: Ventilator O2  Flow Rate: O2 Flow Rate (L/min): 55 L/min  Intake/output summary:   Intake/Output Summary (Last 24 hours) at 07/27/2019 1201 Last data filed at 07/27/2019 0800 Gross per 24 hour  Intake 1340 ml  Output 1300 ml  Net 40 ml   LBM: Last BM Date: 07/26/19 Baseline Weight: Weight: 55.2 kg Most recent weight: Weight: 55.2 kg       Palliative Assessment/Data: 30%    Flowsheet Rows     Most Recent Value  Intake Tab  Referral Department  Critical care  Unit at Time of Referral  ICU  Date Notified  06/16/19  Palliative Care Type  Not seen  Reason Not Seen  Consult cancelled  Reason for referral  Clarify Goals of Care  Date of Admission  06/15/19  # of days IP prior to Palliative referral  1  Clinical Assessment  Psychosocial & Spiritual Assessment  Palliative Care Outcomes      Patient Active Problem List   Diagnosis Date Noted  . Pressure injury of skin 06/28/2019  . Unresponsiveness 06/15/2019  . Encounter for hospice care discussion   . Left eyelid laceration   . Acute metabolic encephalopathy   . Pulmonary hypertension ([place])   . Goals of care, counseling/discussion   . Palliative care by specialist   . DNR (do not resuscitate) discussion   . COPD, very severe ([place])   . Elevated troponin 05/29/2019  . Syncope 05/29/2019  . Acute on chronic respiratory failure with hypoxemia ([place]) 04/16/2019  . Acute respiratory failure with hypoxia ([place]) 01/24/2019  . Thrombocytopenia ([place]) 01/24/2019  . Protein-calorie malnutrition, severe 12/10/2016  . Intestinal adhesions with complete obstruction  ([place]) 12/05/2016  . Constipation   . SBO (small bowel obstruction) ([place])   . Acute bronchitis 09/26/2015  . COPD exacerbation ([place]) 09/24/2015  . Acute on chronic respiratory failure with hypoxia and hypercapnia ([place]) 09/24/2015  . Abnormal ECG 09/24/2015  . Edema, lower extremity 09/24/2015    Palliative Care Assessment & Plan     Recommendations/Plan: Recommend palliative at D/C.    Code Status:    Code Status Orders  (From admission, onward)         Start     Ordered   06/17/19 1046  Do not attempt resuscitation (DNR)  Continuous    Question Answer Comment  In the event of cardiac or respiratory ARREST Do not call a "code blue"   In the event of cardiac or respiratory ARREST Do not perform Intubation, CPR, defibrillation or ACLS   In the event of cardiac or respiratory ARREST Use medication by any route, position, wound care, and other measures to relive pain and suffering. May use oxygen, suction and manual treatment of airway obstruction as needed for comfort.      06/17/19 1046        Code Status History    Date Active Date Inactive Code Status Order ID Comments User Context   06/15/2019 0632 06/17/2019 1046 Full Code [id number]  Awilda Bill, NP ED   06/01/2019 1126 06/03/2019 2206 DNR [id number]  Drue Novel, NP Inpatient   05/29/2019 0910 06/01/2019 1125 Full Code [id number]  Ivor Costa, MD ED   04/17/2019 0026 04/20/2019 1937 Full Code [id number]  Elwyn Reach, MD ED   01/24/2019 1932 01/27/2019 1831 Full Code [id number]  Orene Desanctis, DO ED   12/05/2016 0157 12/15/2016 1800 Full Code [id number]  Vickie Epley, MD ED   09/24/2015 1353 09/24/2015 1430 Full Code [id number]  [place],  Duane Lope, MD Inpatient   Advance Care Planning Activity       Prognosis:   Unable to determine    Thank you for allowing the Palliative Medicine Team to assist in the care of this patient.   Total Time 15 min Prolonged Time Billed no      Greater than 50%  of this time was  spent counseling and coordinating care related to the above assessment and plan.  Morton Stall, NP  Please contact Palliative Medicine Team phone at [phone] for questions and concerns.

## 2019-07-27 NOTE — TOC Progression Note (Signed)
Transition of Care [facility]) - Progression Note    Patient Details  Name: Chris Case MRN: [id number] Date of Birth: [birth year]  Transition [facility] Care [facility]) CM/SW Contact  Liliana Cline, LCSW Phone Number: 07/27/2019, 12:48 PM  Clinical Narrative:     [facility]- Called Representative Prem. Left a voicemail requesting a return call about referral.   [facility]- Called Representative Clydie Braun. She reported she will talk to their team and try to get an answer today.     Expected Discharge Plan: Home w Home Health Services Barriers to Discharge: Continued Medical Work up  Expected Discharge Plan and Services Expected Discharge Plan: Home w Home Health Services     Post Acute Care Choice: Home Health Living arrangements for the past 2 months: Single Family Home                             HH [facility]     Representative spoke with at [facility] Agency: Feliberto Gottron   Social Determinants of Health (SDOH) Interventions    Readmission Risk Interventions Readmission Risk Prevention Plan 06/29/2019  Transportation Screening Complete  PCP or Specialist Appt within 3-5 Days Complete  HRI or Home Care Consult Complete  Palliative Care Screening Complete  Medication Review (RN Care Manager) Complete  Some recent data might be hidden

## 2019-07-27 NOTE — Progress Notes (Signed)
Pt needed IV morphine multiple times throughout shift for anxiety, SOB, and respiratory distress. Failed SBT. Unable to get to chair due to SOB. BM at [facility] but became very SOB and required PRN morphine afterward. Pt went into a-fib while in respiratory distress with a rate 70-80. Converted back to NSR after SOB resolved. Feeding tube intact. Tracheostomy clean and intact. Will continue to monitor.

## 2019-07-27 NOTE — Progress Notes (Signed)
Pt became anxious and SOB. Accessory muscle use noted and oxygen saturation dropped to 60s. 100% oxygen administered temporarily for support while morphine and ativan administered for anxiety to help with respiratory distress. Pt returned to 100% on ventilator and is now breathing without distress. Will continue to monitor.

## 2019-07-27 NOTE — Progress Notes (Addendum)
Nutrition Follow-up  DOCUMENTATION CODES:   Severe malnutrition in context of chronic illness  INTERVENTION:  Continue Osmolite 1.5 Cal at 75 mL/hr (1800 mL goal daily volume). Provides 2700 kcal, 113 grams of protein, 366.5 grams of carbohydrates, 1368 mL H2O daily.  Goal TF regimen meets 100% RDIs for vitamins/minerals.  Continue Juven 1 packetper tubeBID. Each packet provides 95 kcal, 7 grams L-Arginine, 7 grams L-Glutamine, 2.5 grams collagen protein, 300 mg vitamin C, 9.5 mg zinc, and other micronutrients essential for wound healing.  Continue minimum free water flush of 30 mL Q4hrs to maintain tube patency.  NUTRITION DIAGNOSIS:   Severe Malnutrition related to chronic illness(COPD) as evidenced by severe fat depletion, severe muscle depletion.  Ongoing - addressing with TF regimen.  GOAL:   Patient will meet greater than or equal to 90% of their needs  Met with TF regimen.  MONITOR:   Labs, Weight trends, Vent status, TF tolerance, I & O's  REASON FOR ASSESSMENT:   Ventilator    ASSESSMENT:   49 year old male with PMHx of emphysema, COPD, hx TB admitted with acute COPD exacerbation requiring intubation.   -Patient s/p tracheostomy tube placement on 4/20. -Patient s/p robotic assisted laparoscopic G-tube placement with extensive lysis of adhesions on 4/22  Patient was sleeping at time of RD assessment. He had received morphine and ativan this morning for anxiety to help with his respiratory distress. Per RN patient continues to tolerate tube feeding. Per wound care note from yesterday patient's stage 3 pressure injury is slowly healing and decreasing in size.  Patient is currently on ventilator support via trach MV: 10.9 L/min Temp (24hrs), Avg:98.5 F (36.9 C), Min:98.1 F (36.7 C), Max:98.8 F (37.1 C)  Medications reviewed and include: famotidine, magnesium oxide 800 mg BID, Miralax 17 grams daily, senna-docusate 2 tablets BID.  Labs reviewed: CBG  97-120. On 5/24 Sodium 136, Chloride 92, CO2 37, Creatinine 0.48, Phosphorus 4.9.  Enteral Access: G-tube  I/O: 1200 mL UOP yesterday (0.9 mL/kg/hr)  Weight trend: 55.2 kg on 5/25 - wt remains stable  Discussed on rounds  Diet Order:   Diet Order            Diet NPO time specified  Diet effective midnight             EDUCATION NEEDS:   No education needs have been identified at this time  Skin:  Skin Assessment: Skin Integrity Issues:(stage 3 pressure injury right buttocks (1.2cm x 0.8cm x 0.1cm))  Last BM:  07/26/2019 - large type 6  Height:   Ht Readings from Last 1 Encounters:  07/22/19 5' 5.98" (1.676 m)   Weight:   Wt Readings from Last 1 Encounters:  07/27/19 55.2 kg   Ideal Body Weight:  64.5 kg  BMI:  Body mass index is 19.65 kg/m.  Estimated Nutritional Needs:   Kcal:  2500-2700  Protein:  100-115 grams  Fluid:  >/= 1.6 L/day  Jacklynn Barnacle, MS, RD, LDN Pager number available on Amion

## 2019-07-27 NOTE — Consult Note (Addendum)
WOC consult requested for stage 3 pressure injury to buttock; this was already performed yesterday.  Please refer to consult note on 5/24.  Topical treatment orders have been provided for bedside nurses to perform. WOC team will continue to assess the site weekly. Thank-you,  Cammie Mcgee MSN, RN, CWOCN, [place], CNS [phone]

## 2019-07-28 LAB — CBC
HCT: 30.2 % — ABNORMAL LOW (ref 39.0–52.0)
Hemoglobin: 9.1 g/dL — ABNORMAL LOW (ref 13.0–17.0)
MCH: 30.6 pg (ref 26.0–34.0)
MCHC: 30.1 g/dL (ref 30.0–36.0)
MCV: 101.7 fL — ABNORMAL HIGH (ref 80.0–100.0)
Platelets: 107 10*3/uL — ABNORMAL LOW (ref 150–400)
RBC: 2.97 MIL/uL — ABNORMAL LOW (ref 4.22–5.81)
RDW: 14.2 % (ref 11.5–15.5)
WBC: 9.6 10*3/uL (ref 4.0–10.5)
nRBC: 0 % (ref 0.0–0.2)

## 2019-07-28 LAB — RENAL FUNCTION PANEL
Albumin: 3.4 g/dL — ABNORMAL LOW (ref 3.5–5.0)
Anion gap: 6 (ref 5–15)
BUN: 23 mg/dL — ABNORMAL HIGH (ref 6–20)
CO2: 37 mmol/L — ABNORMAL HIGH (ref 22–32)
Calcium: 8.9 mg/dL (ref 8.9–10.3)
Chloride: 94 mmol/L — ABNORMAL LOW (ref 98–111)
Creatinine, Ser: 0.36 mg/dL — ABNORMAL LOW (ref 0.61–1.24)
GFR calc Af Amer: >60 mL/min (ref >60)
GFR calc non Af Amer: >60 mL/min (ref >60)
Glucose, Bld: 104 mg/dL — ABNORMAL HIGH (ref 70–99)
Phosphorus: 3.1 mg/dL (ref 2.5–4.6)
Potassium: 4.3 mmol/L (ref 3.5–5.1)
Sodium: 137 mmol/L (ref 135–145)

## 2019-07-28 LAB — GLUCOSE, CAPILLARY
Glucose-Capillary: 83 mg/dL (ref 70–99)
Glucose-Capillary: 87 mg/dL (ref 70–99)
Glucose-Capillary: 105 mg/dL — ABNORMAL HIGH (ref 70–99)
Glucose-Capillary: 112 mg/dL — ABNORMAL HIGH (ref 70–99)
Glucose-Capillary: 99 mg/dL (ref 70–99)
Glucose-Capillary: 115 mg/dL — ABNORMAL HIGH (ref 70–99)

## 2019-07-28 LAB — SARS CORONAVIRUS 2 BY RT PCR (HOSPITAL ORDER, PERFORMED IN ~~LOC~~ HOSPITAL LAB): SARS Coronavirus 2: NEGATIVE

## 2019-07-28 LAB — MAGNESIUM: Magnesium: 1.9 mg/dL (ref 1.7–2.4)

## 2019-07-28 MED ORDER — LORAZEPAM 2 MG/ML IJ SOLN
1.0000 mg | INTRAMUSCULAR | Status: DC | PRN
  Administered 2019-07-29 (×2): 2 mg via INTRAVENOUS
  Filled 2019-07-28 (×3): qty 1

## 2019-07-28 MED ORDER — SILDENAFIL CITRATE 20 MG PO TABS
20.0000 mg | ORAL_TABLET | Freq: Three times a day (TID) | ORAL | Status: DC
  Administered 2019-07-28 – 2019-07-29 (×3): 20 mg via ORAL
  Filled 2019-07-28 (×6): qty 1

## 2019-07-28 MED ORDER — TRAZODONE HCL 50 MG PO TABS
50.0000 mg | ORAL_TABLET | Freq: Every evening | ORAL | Status: DC | PRN
  Administered 2019-07-28: 50 mg via ORAL
  Filled 2019-07-28: qty 1

## 2019-07-28 MED ORDER — FENTANYL 50 MCG/HR TD PT72
1.0000 | MEDICATED_PATCH | TRANSDERMAL | Status: DC
  Administered 2019-07-28: 1 via TRANSDERMAL
  Filled 2019-07-28: qty 1

## 2019-07-28 MED ORDER — LORAZEPAM 2 MG/ML IJ SOLN
2.0000 mg | Freq: Once | INTRAMUSCULAR | Status: AC
  Administered 2019-07-28: 2 mg via INTRAVENOUS

## 2019-07-28 MED ORDER — LORAZEPAM 1 MG PO TABS
1.0000 mg | ORAL_TABLET | Freq: Three times a day (TID) | ORAL | Status: DC
  Administered 2019-07-28 – 2019-07-29 (×3): 1 mg via ORAL
  Filled 2019-07-28 (×3): qty 1

## 2019-07-28 MED ORDER — FREE WATER
50.0000 mL | Status: DC
  Administered 2019-07-28 – 2019-07-29 (×8): 50 mL

## 2019-07-28 NOTE — Progress Notes (Signed)
PHARMACY CONSULT NOTE  Pharmacy Consult for Electrolyte Monitoring and Replacement   Recent Labs: Potassium (mmol/L)  Date Value  07/28/2019 4.3  09/24/2013 4.6   Magnesium (mg/dL)  Date Value  48/34/7583 1.9   Calcium (mg/dL)  Date Value  07/46/0029 8.9   Calcium, Total (mg/dL)  Date Value  84/73/0856 8.9   Albumin (g/dL)  Date Value  94/37/0052 3.4 (L)   Phosphorus (mg/dL)  Date Value  59/12/2888 3.1   Sodium (mmol/L)  Date Value  07/28/2019 137  09/24/2013 138    Assessment: 49 year old male admitted after being found unresponsive and admitted with end stage COPD. Patient was intubated in the ED and transferred to the ICU. Patient with trach and G-tube.  Electrolytes: Goal: K ~4, Mag ~2.  All other electrolytes WNL  Patient currently on mag-ox 800 mg VT BID.   Patient has been stable: Continue to monitor labs but less frequently than once daily: next 5/28 am    Glucose: Following CBGs q4h. Patient restarted tube feeds 5/13.   Will continue to monitor.  Constipation: Last BM 5/25.  Current orders for scheduled Miralax and Senokot-S.   Pharmacy will continue to monitor and adjust per consult.   Lowella Bandy, PharmD 07/28/2019 7:08 AM

## 2019-07-28 NOTE — Progress Notes (Signed)
Occupational Therapy Treatment Patient Details Name: Chris Case MRN: [id number] DOB: [birth year] Today's Date: 07/28/2019    History of present illness Pt. is a 48yoM who comes to [facility] on 4/13 after being found unresponsive. Pt here 29 May 2019 through 03 June 2019. Pt required ventilation upon arrival, failed extubation on 4/16, transitioned to tach on 4/20. Pt has required NGT decompression d/t ABD distension, underwent  PEG placement and lysis adhesions 4/22. 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 Pt has since been trialed on PMV but has not been off vent support long enough to trial much, limited tolerance on tach collar.   OT comments  Pt. continues to present on a vent with severe COPD Stage IV.  HR 83 bpms, SO2 96%-100%. BP 94/69. Pt.'s nurse reports that pt. Is medicated with narcotics secondary to having episodes of anxiety this morning.  Pt. Participated in UE there. Ex.  with red theraband for bilateral elbow flexion, and extension 2 sets 10 reps each with rest breaks.  Pt. required cues for visual demonstration for proper technique. Pt. Tolerated the exercises well. Pt. continues to benefit from OT services for ADL training, A/E training, and pt. education about energy conservation, work simplification techniques, home modification, and DME. Discharge disposition continues to be appropriate. Pt. Continues to be appropriate for follow-up OT services upon discharge.   Follow Up Recommendations  LTACH;SNF    Equipment Recommendations  3 in 1 bedside commode    Recommendations for Other Services      Precautions / Restrictions Precautions Precautions: Fall Restrictions Weight Bearing Restrictions: No       Mobility Bed Mobility               General bed mobility comments: Deferred. Pt. nurse reports pt. medicated with narcotics secondary to episodes of increased anxiety this morning.  Transfers                       Balance                                           ADL either performed or assessed with clinical judgement   ADL       Grooming: Set up                                   Vision Baseline Vision/History: No visual deficits Patient Visual Report: No change from baseline     Perception     Praxis      Cognition Arousal/Alertness: Awake/alert Behavior During Therapy: WFL for tasks assessed/performed;Impulsive Overall Cognitive Status: Within Functional Limits for tasks assessed                                          Exercises     Shoulder Instructions       General Comments      Pertinent Vitals/ Pain          Home Living  Prior Functioning/Environment              Frequency  Min 1X/week        Progress Toward Goals  OT Goals(current goals can now be found in the care plan section)  Progress towards OT goals: OT to reassess next treatment  Acute Rehab OT Goals Patient Stated Goal: to go home OT Goal Formulation: With patient Time For Goal Achievement: 08/05/19 Potential to Achieve Goals: Good  Plan Discharge plan remains appropriate;Frequency remains appropriate    Co-evaluation                 AM-PAC OT "6 Clicks" Daily Activity     Outcome Measure   Help from another person eating meals?: None Help from another person taking care of personal grooming?: None Help from another person toileting, which includes using toliet, bedpan, or urinal?: A Lot Help from another person bathing (including washing, rinsing, drying)?: A Lot Help from another person to put on and taking off regular upper body clothing?: A Little Help from another person to put on and taking off regular lower body clothing?: A Lot 6 Click Score: 17    End of Session    OT Visit Diagnosis: Muscle weakness (generalized) (M62.81)   Activity  Tolerance Patient tolerated treatment well   Patient Left in bed;with call bell/phone within reach   Nurse Communication Other (comment)(pt. status)        Time: 0936-1000 OT Time Calculation (min): 24 min  Charges: OT General Charges $OT Visit: 1 Visit OT Treatments $Self Care/Home Management : 23-37 mins  Harrel Carina, MS, OTR/L   Harrel Carina 07/28/2019, 10:19 AM

## 2019-07-28 NOTE — Progress Notes (Signed)
SLP F/U Note  Patient Details Name: Chris Case MRN: [id number] DOB: 1970-03-06   Cancelled treatment:       Reason Eval/Treat Not Completed: (chart reviewed). Per review of MD/Pulmonar y notes, pt remains on Full vent support. Intermittent weaning attempts continue per MD notes; attempts w/ HFNC and PS weans can only be tolerated for brief period/hour at a time intermittently, but pt experiences Severe Anxiety and has to be placed back on Full vent support d/t increased WOB, anxiety. Oral sedatives/narcotic medications required d/t level of Anxiety. This presentation is not conducive to safely, and comfortably, wearing the PMV for verbal communication. Per MD note on 07/27/2019, pt w/ "increased WOB, end stage COPD, did NOT tolerate PS mode". D/C plans w/ CM and SW are still ongoing; Palliative care on case. Noted discourse of "patient is agreeable to home with Hospice" d/t severity of Pulmonary issues, per chart note. ST services does not recommend use of PMV at this time until pt's Pulmonary status is stable and PMV wear would be safe, comfortable for pt. Recommend MD to reconsult ST services when more medically appropriate.     Jerilynn Som, MS, CCC-SLP Karletta Millay 07/28/2019, 11:09 AM

## 2019-07-28 NOTE — Progress Notes (Signed)
CH visited pt. while rounding on ICU as follow-up from previous visits; CH learned from pt.'s RN that he is scheduled to be discharged to a SNF in [facility] tomorrow.  Pt. in bed, apparently asleep, but opened eyes when [facility] quietly whispered his name.  Pt. says he is doing well today; pt. appeared very tired to [facility]; CH visited only briefly to allow pt. to rest --> pt. requested prayer; CH prayed for continued divine strength, peace, and courage for pt.  Pt.'s arms trembled as CH held hands w/him during prayer.  CH will return tomorrow AM to check in w/pt. before his discharge if possible.    07/28/19 1545  Clinical Encounter Type  Visited With Patient  Visit Type Follow-up;Psychological support;Social support;Spiritual support;Critical Care  Referral From Other (Comment) (Routine Rounding)  Spiritual Encounters  Spiritual Needs Prayer;Emotional  Stress Factors  Patient Stress Factors Lack of knowledge;Health changes;Loss of control;Major life changes

## 2019-07-28 NOTE — TOC Progression Note (Addendum)
Transition of Care [facility]) - Progression Note    Patient Details  Name: Chris Case MRN: [id number] Date of Birth: Oct 13, 1970  Transition of Care [facility]) CM/SW Contact  Liliana Cline, LCSW Phone Number: 07/28/2019, 11:41 AM  Clinical Narrative:   CSW spoke with [facility] Clydie Braun who reported they may have a bed for patient. Provided Medicaid number and SS number. Clydie Braun reported she will need to confirm patient's insurance. CSW informed Clydie Braun patient is still having anxiety/respiratory issues which require medication. She verbalized understanding. CSW faxed updated medication list as well as most recent MD and RN notes. Clydie Braun reported she will have her team review these to confirm there are no changes and that they can take patient. Clydie Braun requested a COVID swab, said a rapid COVID is fine. CSW updated MD who will order after we get confirmation that they can accommodate patient. Clydie Braun is aware we are waiting on this before COVID swab is ordered.   1:00- Called [facility] Clydie Braun for update. She reported as long as patient needs continuous vent (cannot be taken off at their SNF), then he they can accept him based on updated information that was sent today. Clydie Braun reported patient would have to be switched from IV Morphine to PO or another type of medication, she asked for this to be changed at [facility] overnight to see if patient tolerates it. Clydie Braun reported they could accept patient tomorrow pending COVID results and switching from IV Morphine. CSW updated Dr. Belia Heman.   2:30- Per Dr. Belia Heman, patient can be switched to PO Morphine, is on a continuous vent, and COVID swab will be ordered. Updated RN. CSW called [facility] Clydie Braun with update. She confirmed they can take patient tomorrow for short-term rehab depending on COVID swab results. Confirmed with [name] that patient's check would be taken since he is on Medicaid and he would get $30 a month during his stay. CSW spoke  with patient at bedside with update. Patient was agreeable to transfer to [facility] as early as tomorrow. CSW asked patient if he wants CSW to update his roommate, [name], he said yes. CSW called and spoke with [name] with update. She reported she has talked to patient and they agree the best option for him is Vent SNF so he can try to get better and return home. She reported she can take patient's belongings to [facility].    Expected Discharge Plan: Home w Home Health Services Barriers to Discharge: Continued Medical Work up  Expected Discharge Plan and Services Expected Discharge Plan: Home w Home Health Services     Post Acute Care Choice: Home Health Living arrangements for the past 2 months: Single Family Home                             HH [facility]     Representative spoke with at [facility] Agency: Feliberto Gottron   Social Determinants of Health (SDOH) Interventions    Readmission Risk Interventions Readmission Risk Prevention Plan 06/29/2019  Transportation Screening Complete  PCP or Specialist Appt within 3-5 Days Complete  HRI or Home Care Consult Complete  Palliative Care Screening Complete  Medication Review (RN Care Manager) Complete  Some recent data might be hidden

## 2019-07-29 ENCOUNTER — Ambulatory Visit (HOSPITAL_COMMUNITY)
Admission: AD | Admit: 2019-07-29 | Discharge: 2019-07-29 | Disposition: A | Discharge status: Home / Self Care | Payer: Medicaid Other | Source: Other Acute Inpatient Hospital | Attending: Internal Medicine | Admitting: Internal Medicine

## 2019-07-29 ENCOUNTER — Inpatient Hospital Stay: Payer: Medicaid Other

## 2019-07-29 DIAGNOSIS — J441 Chronic obstructive pulmonary disease with (acute) exacerbation: Secondary | ICD-10-CM

## 2019-07-29 DIAGNOSIS — J9690 Respiratory failure, unspecified, unspecified whether with hypoxia or hypercapnia: Secondary | ICD-10-CM | POA: Insufficient documentation

## 2019-07-29 LAB — GLUCOSE, CAPILLARY
Glucose-Capillary: 93 mg/dL (ref 70–99)
Glucose-Capillary: 98 mg/dL (ref 70–99)
Glucose-Capillary: 116 mg/dL — ABNORMAL HIGH (ref 70–99)
Glucose-Capillary: 106 mg/dL — ABNORMAL HIGH (ref 70–99)

## 2019-07-29 MED ORDER — OXYCODONE-ACETAMINOPHEN 5-325 MG PO TABS: 1.0000 | ORAL_TABLET | ORAL | 0 refills | Status: AC | PRN

## 2019-07-29 MED ORDER — LORAZEPAM 1 MG PO TABS
1.0000 mg | ORAL_TABLET | Freq: Two times a day (BID) | ORAL | 0 refills | Status: AC
Start: 2019-07-29 — End: 2020-07-28

## 2019-07-29 MED ORDER — TRAZODONE HCL 50 MG PO TABS: 50.0000 mg | ORAL_TABLET | Freq: Every evening | ORAL | Status: AC | PRN

## 2019-07-29 MED ORDER — MORPHINE SULFATE (CONCENTRATE) 10 MG /0.5 ML PO SOLN: 5.0000 mg | ORAL | 0 refills | Status: AC | PRN

## 2019-07-29 MED ORDER — ESCITALOPRAM OXALATE 10 MG PO TABS: 10.0000 mg | ORAL_TABLET | Freq: Every day | ORAL | Status: AC

## 2019-07-29 MED ORDER — SENNOSIDES-DOCUSATE SODIUM 8.6-50 MG PO TABS: 2.0000 | ORAL_TABLET | Freq: Two times a day (BID) | ORAL | Status: AC

## 2019-07-29 MED ORDER — SILDENAFIL CITRATE 20 MG PO TABS: 20.0000 mg | ORAL_TABLET | Freq: Three times a day (TID) | ORAL | 0 refills | Status: AC

## 2019-07-29 MED ORDER — POLYETHYLENE GLYCOL 3350 17 G PO PACK: 17.0000 g | PACK | Freq: Every day | ORAL | 0 refills | Status: AC

## 2019-07-29 MED ORDER — FAMOTIDINE 20 MG PO TABS: 20.0000 mg | ORAL_TABLET | Freq: Two times a day (BID) | ORAL | Status: AC

## 2019-07-29 MED ORDER — FENTANYL 50 MCG/HR TD PT72: 1.0000 | MEDICATED_PATCH | TRANSDERMAL | 0 refills | Status: AC

## 2019-07-29 MED ORDER — LORAZEPAM 1 MG PO TABS: 1.0000 mg | ORAL_TABLET | Freq: Three times a day (TID) | ORAL | 0 refills | Status: AC

## 2019-07-29 MED ORDER — IPRATROPIUM-ALBUTEROL 0.5-2.5 (3) MG/3ML IN SOLN: 3.0000 mL | RESPIRATORY_TRACT | Status: AC

## 2019-07-29 NOTE — Progress Notes (Addendum)
Patient remained stable throughout shift. Did have severe work of breathing 3 times, administered alternating prn ativan and morphine. Which really helped with his work of breathing. Was able to facilitate per dr Belia Heman that pts dog being brought to the hospital to visit before he left along with his roommate [name]. Pt left with [facility] along with all belongings. Pt stable at discharge. Called in report to [place] and jonathan at [facility] and called in report to Sherre Scarlet (accepting md Dr Roxanna Mew) at [facility] and rehab. Gave all rxs and discharge ppwk to jonathan with [facility] to give to facility when pt arrives. Capped off peg tube before discharge.

## 2019-07-29 NOTE — TOC Progression Note (Addendum)
Transition of Care [facility]) - Progression Note    Patient Details  Name: Chris Case MRN: [id number] Date of Birth: 02/22/1971  Transition of Care [facility]) CM/SW Contact  Liliana Cline, LCSW Phone Number: 07/29/2019, 8:43 AM  Clinical Narrative:   [name] COVID test was negative. CSW called [facility] Admissions Clydie Braun with update and to inquire about additional needs in order to get patient transferred there today. Attempted call to [facility] about transport today, no answer.  10:45- Spoke to [facility]. Clydie Braun confirmed they can accept patient today. Updated MD and RN. Clydie Braun requested copy of COVID swab results and DC Summary. CSW will fax DC Summary when it is done. Clydie Braun reported patient can come any time today/tonight. She reported they just need the DC Summary so they can get his medications ready for him. Patient is aware of plan for transfer today. Patient will be in Room C304 at [facility], report will need to be called by RN to [phone] when patient is ready. RN to set up [facility] transport when patient is ready to discharge.  RN aware. Per RN, she is trying to get in touch with patient's roommate about bringing his dog to visit him if possible before he goes.   11:30- Attempted call to roommate, [name], with update. Left a voicemail requesting a return call.   12:00- Faxed DC Summary and COVID results to [facility] Clydie Braun. Completed Nonemergent EMS Paperwork and placed with Facesheet and DNR on chart.   1:30- Spoke to [employer]. Have not heard back from roommate yet. Informed Verlon Au we need to proceed with setting up [facility] to avoid delays in discharge since roommate is not responding to calls. Verlon Au agrees. Verlon Au to call [facility] to arrange transport and call report to [facility]. CSW called and updated [facility] Representative Clydie Braun that Verlon Au will be calling [facility] and we will let her know what time patient is expected to be at [facility] today. Provided ICU Main # to Clydie Braun in case staff has any questions before or after patient arrives at [facility]. Informed Clydie Braun that patient is followed by [facility], she is unsure if they use that agency but stated if not they would set patient up with Palliative Services through one of their preferred agencies once he arrives at [facility]. Updated [facility] Representative Clydie Braun who is letting her staff know to follow-up as well to ensure patient has Palliative follow-up at [facility].   2:00- Call from [employer] with questions about patient's tube feeds. Faxed Dietitian note to Clydie Braun.   3:00- CSW called RN Verlon Au for update who reported she called [facility] and they said EMS needs to be called. Asked RN to confirm that they know patient is on a continuous vent. RN to call [facility] back.   3:25- Call to RN. Spoke to Secondary school teacher. Per Nurse Secretary, RN set up [facility] transport and they plan to arrive to pick patient up within 30-40 minutes. Per Nurse Secretary, RN is trying to reach [facility] to call report now and is having trouble. Provided contact number for Admissions Worker Clydie Braun for RN to follow up if unable to reach anyone. CSW also called [facility] Clydie Braun and provided update of patient being in route to [facility] by [facility] in 30-40 minutes. Informed Clydie Braun cofirmed that the RN is having trouble getting in touch to call report. Clydie Braun reported they can call her if  needed.      Barriers to Discharge: Continued Medical Work up  Expected Discharge Plan and Services Expected Discharge Plan: [place] arrangements for the past 2 months: [place]: [place] ([facility])     Representative spoke with at [place]: [place] (SDOH) Interventions    Readmission Risk  Interventions Readmission Risk Prevention Plan 06/29/2019  Transportation Screening Complete  PCP or Specialist Appt within 3-5 Days Complete  HRI or Home Care Consult Complete  Palliative Care Screening Complete  Medication Review (RN Care Manager) Complete  Some recent data might be hidden

## 2019-07-29 NOTE — Progress Notes (Signed)
1:50pm - CH visited pt. as follow from previous visits; pt. appeared asleep but opened eyes when [facility] entered.  Pt. seemed deeply weary and fatigued; CH decided to return later today.  2:30pm - CH observed pt.'s dog had been brought to hospital by roommate [name]; Pt. appeared to enjoy dog's presence but was still very fatigued.  4pm - Transport team arrived to discharge pt. to SNF.  CH provided supportive presence for pt. and [name]; [name] worried SNF will be a 'hell hole' and that pt. will not get needed care.  [name] also shared pt.'s sister has been calling her repeatedly recently --> [name] unsure whether to engage or not.  CH wished pt. well and offered a last prayer for blessing and divine peace in his transition to new location.  Pt. said hoarsely, 'I'll be praying for you too.' Ch remains available as needed.    07/29/19 1640  Clinical Encounter Type  Visited With Patient and family together;Health care provider  Visit Type Follow-up;Spiritual support;Social support;Psychological support;Critical Care  Referral From Other (Comment) (Routine Rounding)  Spiritual Encounters  Spiritual Needs Emotional;Prayer  Stress Factors  Patient Stress Factors Major life changes;Health changes  Family Stress Factors Lack of knowledge;Health changes;Loss of control;Major life changes

## 2019-07-29 NOTE — Plan of Care (Signed)
  Problem: Health Behavior/Discharge Planning: Goal: Ability to manage health-related needs will improve Outcome: Completed/Met   Problem: Clinical Measurements: Goal: Ability to maintain clinical measurements within normal limits will improve Outcome: Completed/Met Goal: Diagnostic test results will improve Outcome: Completed/Met Goal: Respiratory complications will improve Outcome: Completed/Met   Problem: Skin Integrity: Goal: Risk for impaired skin integrity will decrease Outcome: Completed/Met   Problem: Activity: Goal: Ability to tolerate increased activity will improve Outcome: Completed/Met   Problem: Respiratory: Goal: Ability to maintain a clear airway and adequate ventilation will improve Outcome: Completed/Met   Problem: Activity: Goal: Ability to tolerate increased activity will improve Outcome: Completed/Met   Problem: Respiratory: Goal: Ability to maintain a clear airway will improve Outcome: Completed/Met Goal: Levels of oxygenation will improve Outcome: Completed/Met Goal: Ability to maintain adequate ventilation will improve Outcome: Completed/Met   Problem: Education: Goal: Ability to state activities that reduce stress will improve Outcome: Completed/Met   Problem: Coping: Goal: Ability to identify and develop effective coping behavior will improve Outcome: Completed/Met   Problem: Self-Concept: Goal: Ability to identify factors that promote anxiety will improve Outcome: Completed/Met Goal: Level of anxiety will decrease Outcome: Completed/Met Goal: Ability to modify response to factors that promote anxiety will improve Outcome: Completed/Met

## 2019-07-29 NOTE — Discharge Summary (Signed)
Physician Discharge Summary  Patient ID: Chris Case MRN: [id number] DOB/AGE: [birth year] 50 y.o.  Admit date: 06/15/2019 Discharge date: 07/29/2019  Admission Diagnoses:COPD  Discharge Diagnoses:  Active Problems:   Unresponsiveness   Pressure injury of skin   Discharged [place] [facility] Course:  49 year old former smoker with a history very severe COPD stage IV, who presented via EMS after being found down and unresponsive. Apparently the patient's mother placed a call to EMS. The patient has a longstanding history of COPD and noncompliance with his oxygen and CPAP. History could not be obtained from the patient as there was no family available and the patient had been intubated and was mechanically ventilated in the ED. Of note he had recently been admitted to [facility] 29 May 2019 through 03 June 2019. He had been evaluated by palliative care during this admission and actually had a DNR order. Patient also had a 2D echo with pulmonary hypertension consistent with cor pulmonale.  Admit records review: Patient has been following with Dr. Wallene Huh at [facility]. Last known PFTs 11 September 2017. FEV1 0.52 L or 20% predicted, FVC 1.49 L or 45% of predicted, FEV1/FVC 35%. FEF 25-75 6%. DLCO was 33%. This is consistent with very severe COPD.  Events: 4/13 admitted for severe resp failure from COPD 4/14 remains critically ill on vent 4/15 remains critically ill, severe COPD, failed SAT/SBT 4/16 extubated yesterday, had to be reintubated remains on ventilator 4/17 sedated on the ventilator 4/18 abdominal distention, CT abdomen and pelvis consistent with ileus and constipation 4/19-patietn remains on ventialtor awainting trach 4/20 - patient is on vent with trach, he had episodes of aggitation today but eventually improved  4/21 - patient with significant distension of abdomen, concern for aerophagia in NIV. There is mucopurulent exudation from trache this was  cultures and patient placed on Unasyn for tracheitis 06/24/19 -trache site improved with less malodorous pus draining, NGT with decompression worked well abdomen is less distended. S/p PEG 06/25/19-patient had some anxiety throughout day today, we have started low dose anxiolytic. His PEG is now being utilized. He has some placement issues with social work due to insurance and we are working on this now.  06/26/19-Patient had no events today, we are waiting for placement now from transitional care team 06/27/19- Patient is calm this morining, abdomen is mildy distended, awaiting placement to [facility] 4/26 plan for PS/TCT 4/27 Pt failed trach collar twice today. Became visibly distressed, diaphoretic, o2 drops, and accessory muscle use.  4/28increased WOB, resp distress, using accessory muscles to breath while on vent 4/29 patient placed on PS mode, given BD therapy, alert and awake, was able to communicate with PM valve 07/02/19-trache changed to cuffed size 6 without incident, patient reports more comfort with improved vocalization.  07/03/19- no overnight events, patient sitting up in bed in no distress.  5/3severe hypoxia and increased WOB, placed back on full vent support 5/4 severe resp distress, can NOT tolerate PS mode, severe resp failure End stage COPD 5/5severe resp distress, can not tolerate TCT, severe end stage COPD 5/7sitting up in chair, able to do some pressure support trials but no trach collar trials. Still appears very tachypneic 5/8 cannot tolerate trach collar, few hours of PS only 5/9remains vent dependent. Minimal progress with weaning 5/10 increased WOB near cardiac arrest, distended abd 5/12on PS mode 5/14back on PC mode, very high levels of anxiety 5/16patient is agreeable to home with hospice after further discourse regarding his recurrent respiratory distress while hospitalized which prevents [place]  from discharge.  5/18-discussed DC plan with CM and SW today.  Palliative care on case 07/21/19- patient had episode of respiratory distress again when off ventilator after few hours. This will be difficult for placement and he will likely be brought back to hospital very quickly.  5/20- patient was transitioned to [facility] today he did well with it for few hours and then again had to be placed on MV. He is still waiting on placment.  5/21-severe anxiety, anxiolytics adjusted 5/23-ambulated around unit with RT and nursing assistance!! 5/24 increased WOB, back on full VENT support, given oral sedatives 5/25 increased WOB, end stage COPD, did NOT tolerate PS mode 5/26 Vent support Consults: ENT, GI    Treatments: VENT SUPPORT S/p TRACH AND PEG TUBE  Discharge Exam: Blood pressure 108/78, pulse 88, temperature 98.1 F (36.7 C), temperature source Oral, resp. rate 17, height 5' 5.98" (1.676 m), weight 56.4 kg, SpO2 98 %. Physical Examination:   General Appearance: on vent Neuro:without focal findings,   HEENT: PERRLA, EOM intact.   Pulmonary: normal breath sounds, No wheezing.  CardiovascularNormal S1,S2.  No m/r/g.   Abdomen: Benign, Soft, non-tender. Renal:  No costovertebral tenderness  GU:  Not performed at this time. Endoc: No evident thyromegaly Skin:   warm, no rashes, no ecchymosis  Extremities: normal, no cyanosis, clubbing. PSYCHIATRIC: Mood, affect within normal limits.   ALL OTHER ROS ARE NEGATIVE   Disposition:  [facility]  OUTPATIENT PALLIATIVE CARE CONSULTATION PATIENT IS DNR STATUS   Allergies as of 07/29/2019   No Known Allergies     Medication List    STOP taking these medications   albuterol (2.5 MG/3ML) 0.083% nebulizer solution Commonly known as: PROVENTIL   albuterol 108 (90 Base) MCG/ACT inhaler Commonly known as: VENTOLIN HFA   mometasone-formoterol 200-5 MCG/ACT Aero Commonly known as: DULERA   Spiriva Respimat 1.25 MCG/ACT Aers Generic drug: Tiotropium Bromide Monohydrate     TAKE these  medications   escitalopram 10 MG tablet Commonly known as: LEXAPRO Place 1 tablet (10 mg total) into feeding tube daily. Start taking on: Jul 30, 2019   famotidine 20 MG tablet Commonly known as: PEPCID Place 1 tablet (20 mg total) into feeding tube 2 (two) times daily.   fentaNYL 50 MCG/HR Commonly known as: DURAGESIC Place 1 patch onto the skin every 3 (three) days. Start taking on: Jul 31, 2019   ipratropium-albuterol 0.5-2.5 (3) MG/3ML Soln Commonly known as: DUONEB Take 3 mLs by nebulization every 4 (four) hours.   LORazepam 1 MG tablet Commonly known as: ATIVAN Take 1 tablet (1 mg total) by mouth 3 (three) times daily.   oxyCODONE-acetaminophen 5-325 MG tablet Commonly known as: PERCOCET/ROXICET Place 1 tablet into feeding tube every 4 (four) hours as needed for moderate pain.   polyethylene glycol 17 g packet Commonly known as: MIRALAX / GLYCOLAX Place 17 g into feeding tube daily. Start taking on: Jul 30, 2019   senna-docusate 8.6-50 MG tablet Commonly known as: Senokot-S Place 2 tablets into feeding tube 2 (two) times daily.   sildenafil 20 MG tablet Commonly known as: REVATIO Take 1 tablet (20 mg total) by mouth 3 (three) times daily.   traZODone 50 MG tablet Commonly known as: DESYREL Take 1 tablet (50 mg total) by mouth at bedtime as needed for sleep.        Signed: Erin Fulling 07/29/2019, 11:22 AM

## 2019-07-29 NOTE — TOC Transition Note (Addendum)
Transition of Care [facility]) - CM/SW Discharge Note   Patient Details  Name: Chris Case MRN: [id number] Date of Birth: September 16, 1970  Transition of Care [facility] CM/SW Contact:  Liliana Cline, LCSW Phone Number: 07/29/2019, 3:30 PM   Clinical Narrative:   Patient has orders to discharge home today. Placement arranged at [facility]. DC Summary, Dietary notes, and COVID results were faxed today. RN Verlon Au called to arrange [facility] transportation. [facility] Representative Clydie Braun aware of estimated arrival time. EMS paperwork completed and is on chart. RN Verlon Au calling report to [facility]. Patient and patient's roommate [name] are aware of discharge plan and agreeable. No additional needs identified at this time.  3:45- Confirmed with Nurse Secretary there are no additional needs and RN was able to give report to [facility] and [facility].   3:55- Call from [facility] Clydie Braun that she needs prescriptions for the Ativan and Morphine (she says "The type that can be given in the PEG tube"). Clydie Braun denied any needs other than these prescriptions. Spoke with RN Verlon Au who reported she will send these with [facility] who has not arrived yet. Also informed Dr. Belia Heman of need. Verlon Au denied additional needs, she confirmed she was able to give report to [facility] and [facility].    Final next level of care: Skilled Nursing Facility([facility] SN[facility]) Barriers to Discharge: Barriers Resolved   Patient Goals and CMS Choice   CMS Medicare.gov Compare Post Acute Care list provided to:: Patient Choice offered to / list presented to : Patient  Discharge Placement              Patient chooses bed at: [facility] and  Rehabilitation([facility] SNF bed) Patient to be transferred to facility by: [facility] - RN set up Name of family member notified: Patient and patient's roommate [name]) Patient and family notified of of transfer: 07/28/19  Discharge Plan and Services     Post Acute Care Choice:  Home Health                      [facility] Agency: [facility]     Representative spoke with at [facility] Agency: Feliberto Gottron  Social Determinants of Health (SDOH) Interventions     Readmission Risk Interventions Readmission Risk Prevention Plan 06/29/2019  Transportation Screening Complete  PCP or Specialist Appt within 3-5 Days Complete  HRI or Home Care Consult Complete  Palliative Care Screening Complete  Medication Review (RN Care Manager) Complete  Some recent data might be hidden

## 2019-07-29 NOTE — Progress Notes (Signed)
PHARMACY CONSULT NOTE  Pharmacy Consult for Electrolyte Monitoring and Replacement   Recent Labs: Potassium (mmol/L)  Date Value  07/28/2019 4.3  09/24/2013 4.6   Magnesium (mg/dL)  Date Value  80/69/9967 1.9   Calcium (mg/dL)  Date Value  22/77/3750 8.9   Calcium, Total (mg/dL)  Date Value  51/09/1250 8.9   Albumin (g/dL)  Date Value  47/99/8001 3.4 (L)   Phosphorus (mg/dL)  Date Value  23/93/5940 3.1   Sodium (mmol/L)  Date Value  07/28/2019 137  09/24/2013 138    Assessment: 49 year old male admitted after being found unresponsive and admitted with end stage COPD. Patient was intubated in the ED and transferred to the ICU. Patient with trach and G-tube.  Electrolytes: Goal: K ~4, Mag ~2.  All other electrolytes WNL  Patient currently on mag-ox 800 mg VT BID.   Patient has been stable: Continue to monitor labs but less frequently than once daily: next 5/28 am    Glucose: Following CBGs q4h. Patient restarted tube feeds 5/13.   Will continue to monitor.  Constipation:   Last BM 5/27   Current orders for scheduled Miralax and Senokot-S.   Pharmacy will continue to monitor and adjust per consult.   Lowella Bandy, PharmD 07/29/2019 7:04 AM

## 2019-08-03 MED ORDER — ESCITALOPRAM OXALATE 10 MG PO TABS
10.00 | ORAL_TABLET | ORAL | Status: DC
Start: 2019-08-03 — End: 2019-08-03

## 2019-08-03 MED ORDER — POLYETHYLENE GLYCOL 3350 17 GM/SCOOP PO POWD
17.00 | ORAL | Status: DC
Start: 2019-08-03 — End: 2019-08-03

## 2019-08-03 MED ORDER — IPRATROPIUM-ALBUTEROL 0.5-2.5 (3) MG/3ML IN SOLN
3.00 | RESPIRATORY_TRACT | Status: DC
Start: ? — End: 2019-08-03

## 2019-08-03 MED ORDER — FAMOTIDINE 20 MG PO TABS
20.00 | ORAL_TABLET | ORAL | Status: DC
Start: 2019-08-03 — End: 2019-08-03

## 2019-08-03 MED ORDER — ARFORMOTEROL TARTRATE 15 MCG/2ML IN NEBU
15.00 | INHALATION_SOLUTION | RESPIRATORY_TRACT | Status: DC
Start: 2019-08-03 — End: 2019-08-03

## 2019-08-03 MED ORDER — SENNOSIDES 8.6 MG PO TABS
8.60 | ORAL_TABLET | ORAL | Status: DC
Start: 2019-08-03 — End: 2019-08-03

## 2019-08-03 MED ORDER — SODIUM CHLORIDE 0.9 % IV SOLN
250.00 | INTRAVENOUS | Status: DC
Start: ? — End: 2019-08-03

## 2019-08-03 MED ORDER — TRAZODONE HCL 50 MG PO TABS
50.00 | ORAL_TABLET | ORAL | Status: DC
Start: 2019-08-03 — End: 2019-08-03

## 2019-08-03 MED ORDER — BENZOCAINE-MENTHOL 6-10 MG MT LOZG
1.00 | LOZENGE | OROMUCOSAL | Status: DC
Start: ? — End: 2019-08-03

## 2019-08-03 MED ORDER — BUDESONIDE 0.5 MG/2ML IN SUSP
0.50 | RESPIRATORY_TRACT | Status: DC
Start: 2019-08-03 — End: 2019-08-03

## 2019-08-03 MED ORDER — OXYCODONE HCL 5 MG PO TABS
5.00 | ORAL_TABLET | ORAL | Status: DC
Start: ? — End: 2019-08-03

## 2019-08-03 MED ORDER — ENOXAPARIN SODIUM 40 MG/0.4ML ~~LOC~~ SOLN
40.00 | SUBCUTANEOUS | Status: DC
Start: 2019-08-03 — End: 2019-08-03

## 2019-08-03 MED ORDER — IPRATROPIUM BROMIDE 0.02 % IN SOLN
0.50 | RESPIRATORY_TRACT | Status: DC
Start: 2019-08-03 — End: 2019-08-03

## 2020-07-01 IMAGING — DX DG ABDOMEN 1V
1 series · 1 of 1 positions shown · non-contrast
Comparison: June 25, 2019 abdominal radiograph and CT abdomen and
pelvis

CLINICAL DATA: Abdo abdominal pain

EXAM:
ABDOMEN - 1 VIEW

[abdomen supine]
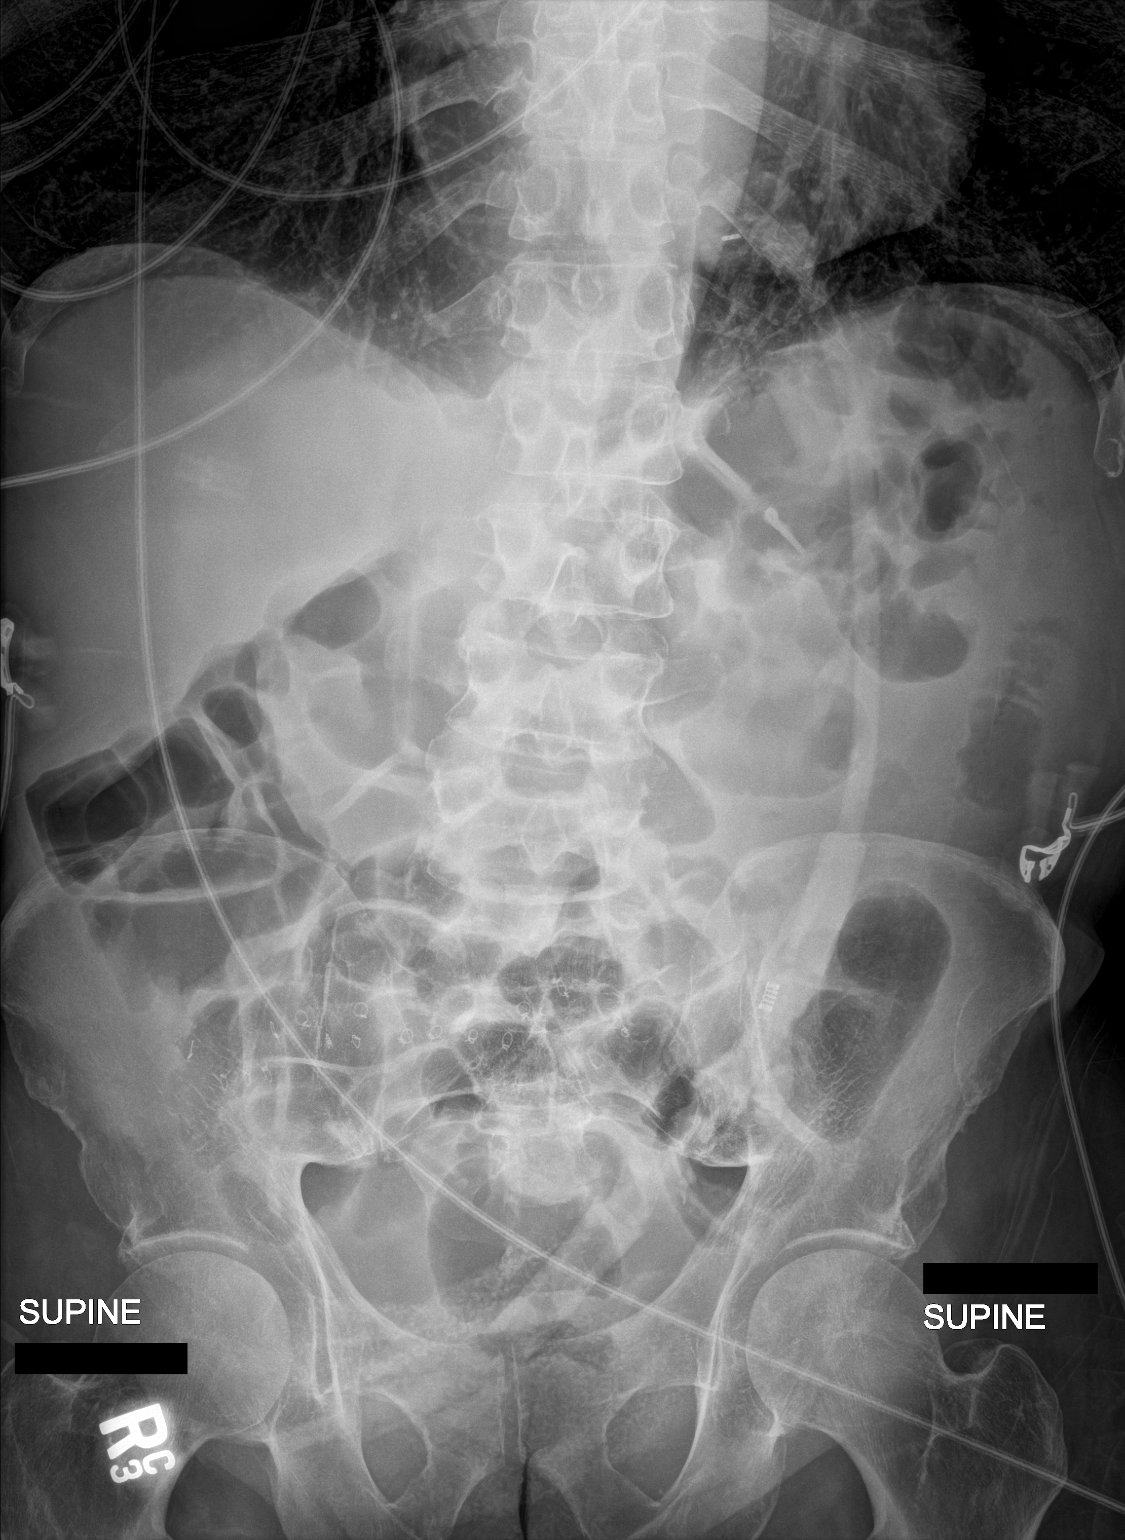

[1 of 1 positions shown; findings below may reference images not displayed]

FINDINGS: Postoperative changes are noted in pelvic region. There is no bowel
dilatation or air-fluid level to suggest bowel obstruction. No free
air. Atelectatic change noted in the medial left base.
IMPRESSION: No bowel obstruction or free air evident. Left base atelectasis.
Postoperative changes in the pelvic region.

## 2020-07-30 IMAGING — DX DG CHEST 1V PORT
2 series · 2 of 2 positions shown · non-contrast
Comparison: July 03, 2019

CLINICAL DATA: Respiratory failure

EXAM:
PORTABLE CHEST 1 VIEW

[chest ap (1 of 2)]
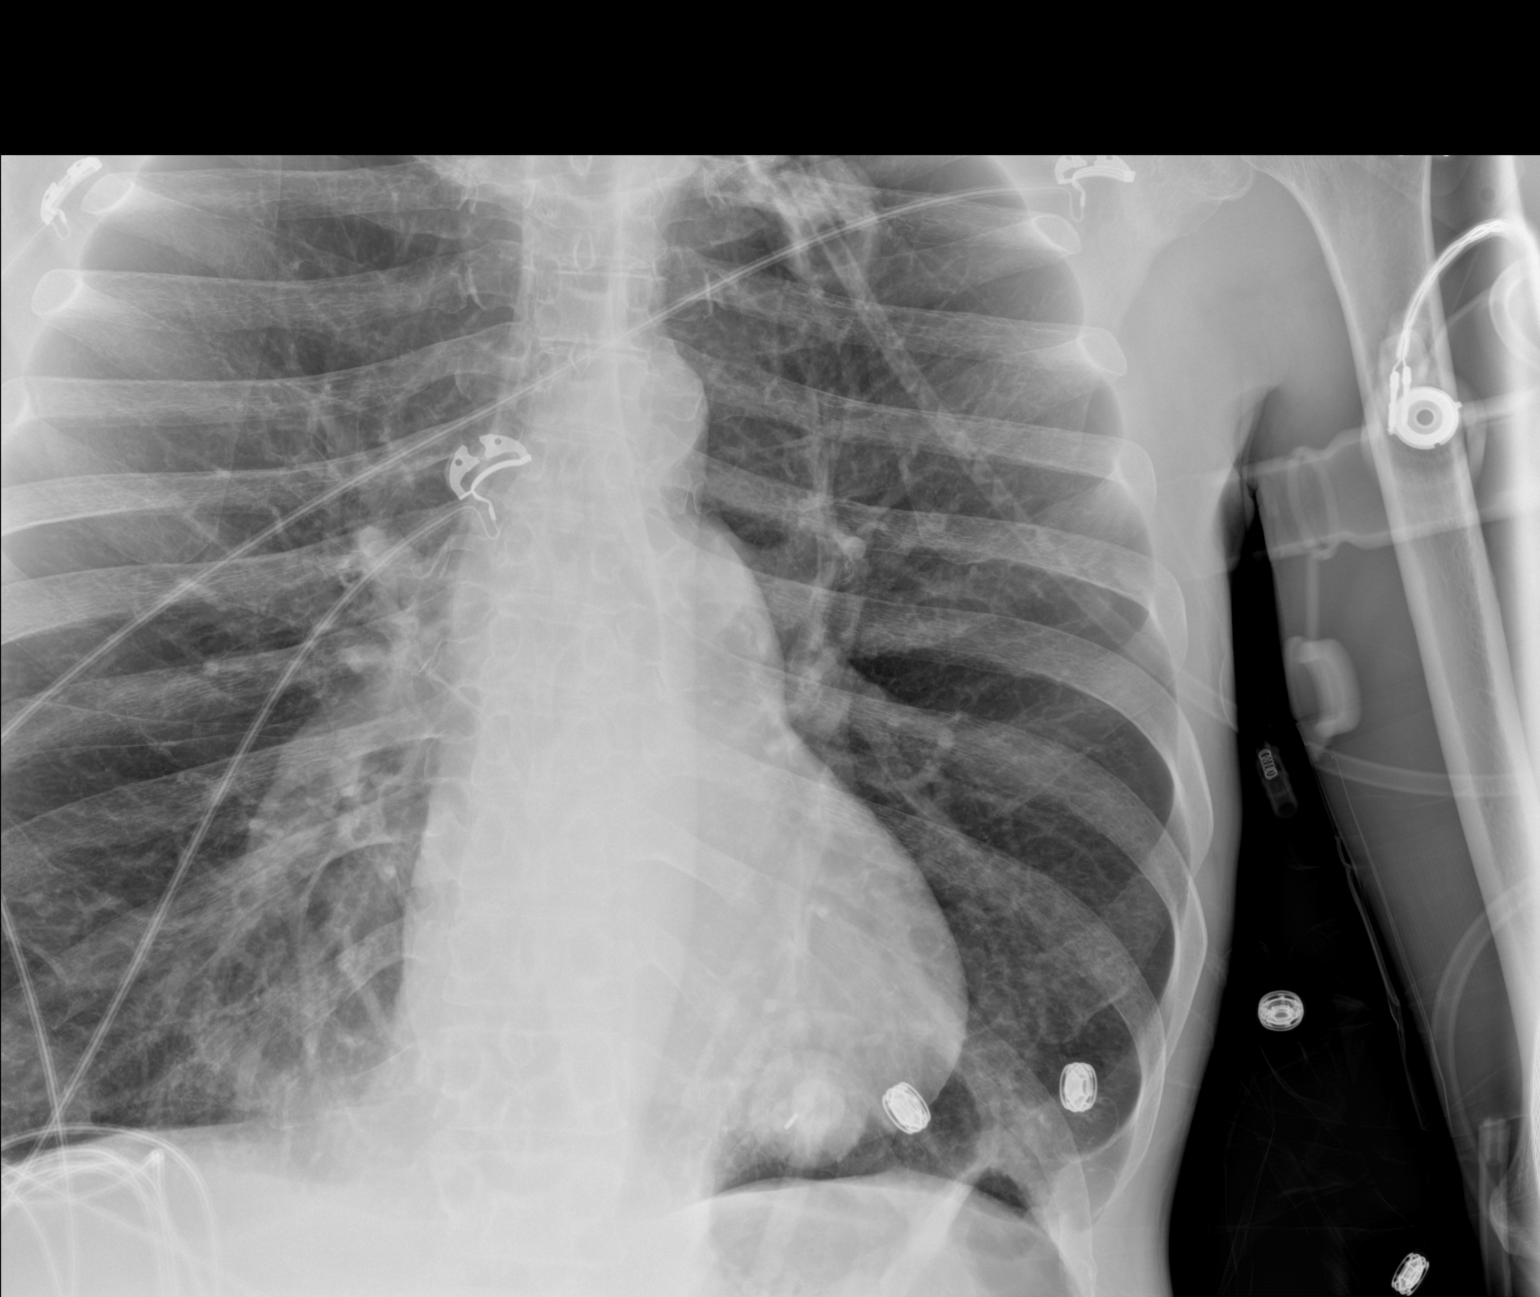

[chest ap (2 of 2)]
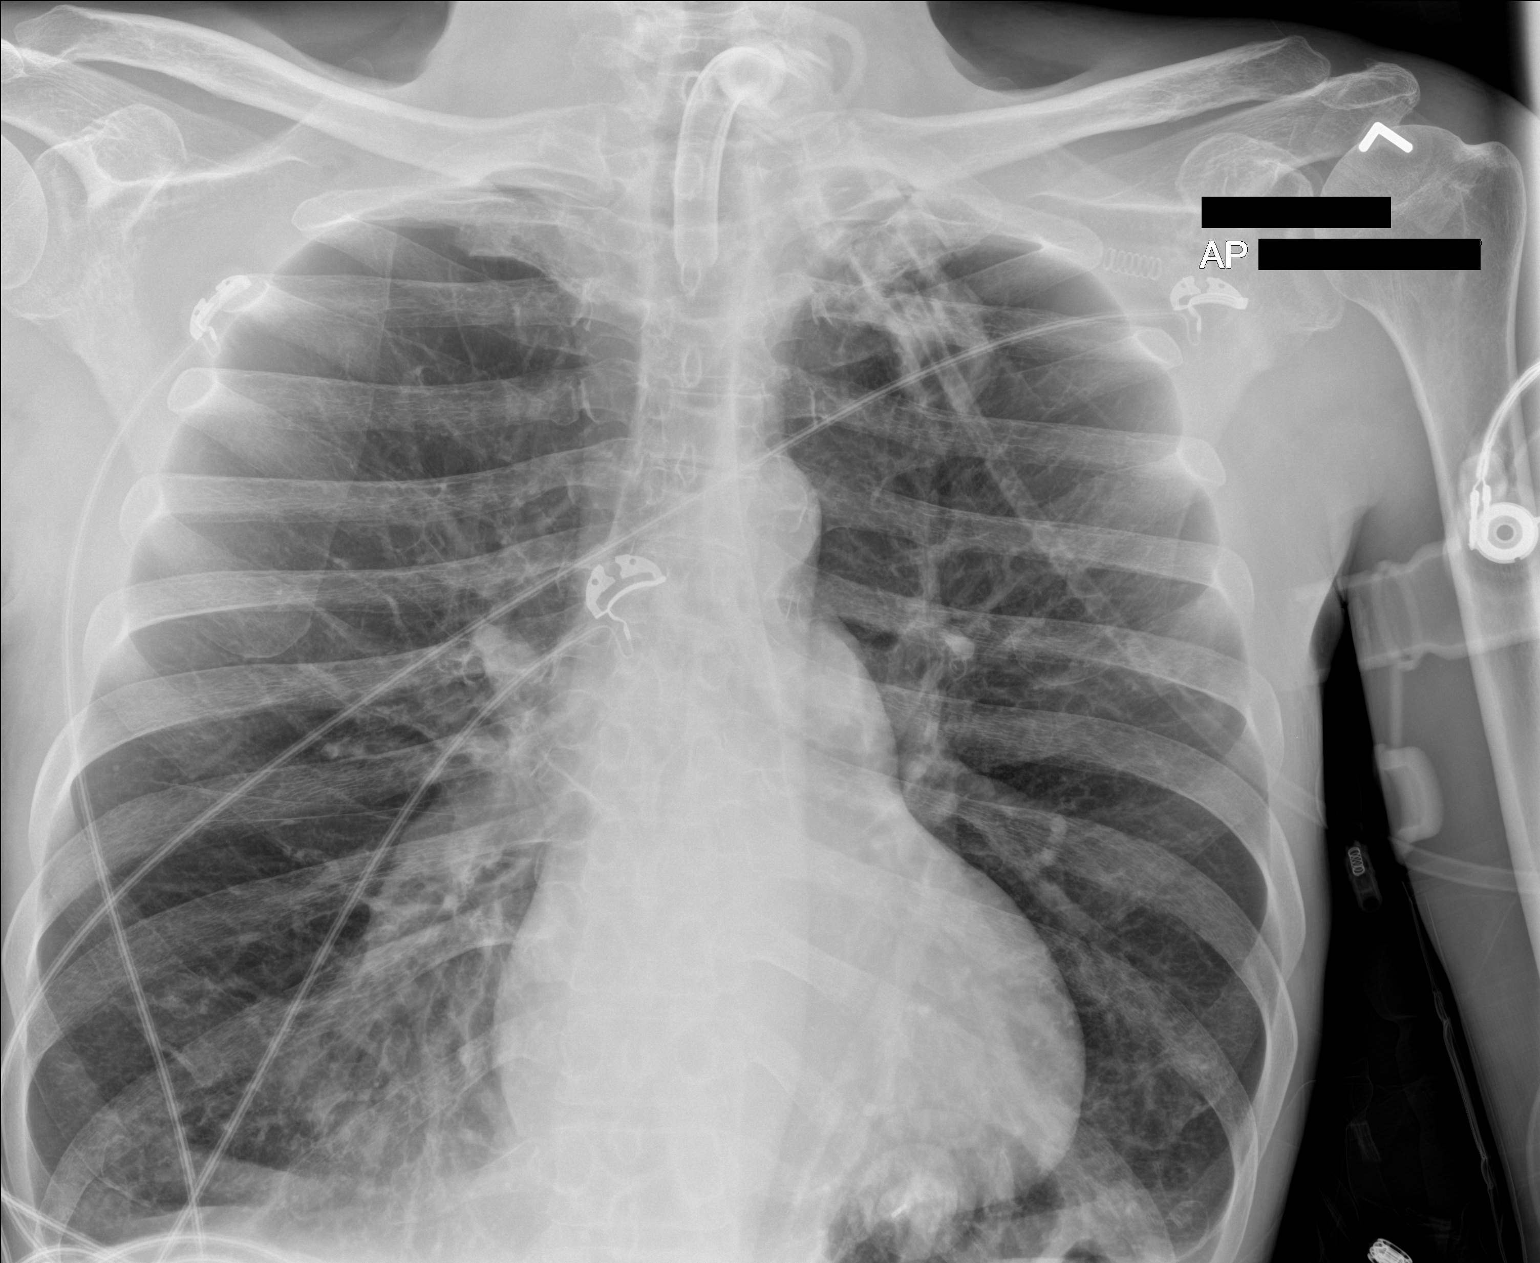

[2 of 2 positions shown; findings below may reference images not displayed]

FINDINGS: Tracheostomy catheter tip is 11.0 cm above the carina. Central
catheter no longer present. Lungs are hyperexpanded. There is no
appreciable edema or airspace opacity. Heart size and pulmonary
vascularity are within normal limits. No adenopathy. No
pneumothorax. No bone lesions.
IMPRESSION: Tracheostomy catheter as described without pneumothorax. Lungs
hyperexpanded without edema or airspace opacity. Stable cardiac
silhouette.
